# Patient Record
Sex: Female | Born: 1970 | Race: White | Hispanic: Yes | Marital: Married | Smoking: Never smoker
Health system: Southern US, Community
[De-identification: ages and names within clinical notes are randomized; demographics above are authoritative.]

## PROBLEM LIST (undated history)

## (undated) DIAGNOSIS — G4730 Sleep apnea, unspecified: Secondary | ICD-10-CM

## (undated) DIAGNOSIS — F32A Depression, unspecified: Secondary | ICD-10-CM

## (undated) DIAGNOSIS — E785 Hyperlipidemia, unspecified: Secondary | ICD-10-CM

## (undated) DIAGNOSIS — E7800 Pure hypercholesterolemia, unspecified: Secondary | ICD-10-CM

## (undated) DIAGNOSIS — J302 Other seasonal allergic rhinitis: Secondary | ICD-10-CM

## (undated) DIAGNOSIS — E079 Disorder of thyroid, unspecified: Secondary | ICD-10-CM

## (undated) DIAGNOSIS — E039 Hypothyroidism, unspecified: Secondary | ICD-10-CM

## (undated) DIAGNOSIS — T7840XA Allergy, unspecified, initial encounter: Secondary | ICD-10-CM

## (undated) DIAGNOSIS — I10 Essential (primary) hypertension: Secondary | ICD-10-CM

## (undated) DIAGNOSIS — F329 Major depressive disorder, single episode, unspecified: Secondary | ICD-10-CM

## (undated) HISTORY — DX: Sleep apnea, unspecified: G47.30

## (undated) HISTORY — DX: Hyperlipidemia, unspecified: E78.5

## (undated) HISTORY — DX: Essential (primary) hypertension: I10

## (undated) HISTORY — DX: Hypothyroidism, unspecified: E03.9

## (undated) HISTORY — DX: Allergy, unspecified, initial encounter: T78.40XA

## (undated) HISTORY — DX: Other seasonal allergic rhinitis: J30.2

## (undated) HISTORY — PX: ADENOIDECTOMY: SUR15

## (undated) HISTORY — PX: DENTAL SURGERY: SHX609

## (undated) HISTORY — PX: COLONOSCOPY: SHX174

---

## 2012-10-25 ENCOUNTER — Other Ambulatory Visit: Payer: Self-pay | Admitting: Allergy and Immunology

## 2012-10-25 ENCOUNTER — Ambulatory Visit
Admission: RE | Admit: 2012-10-25 | Discharge: 2012-10-25 | Disposition: A | Discharge status: Home / Self Care | Payer: 59 | Source: Ambulatory Visit | Attending: Allergy and Immunology | Admitting: Allergy and Immunology

## 2012-10-25 DIAGNOSIS — R059 Cough, unspecified: Secondary | ICD-10-CM

## 2012-10-25 DIAGNOSIS — R05 Cough: Secondary | ICD-10-CM

## 2015-10-01 ENCOUNTER — Encounter: Payer: Self-pay | Admitting: Emergency Medicine

## 2015-10-01 ENCOUNTER — Inpatient Hospital Stay
Admission: EM | Admit: 2015-10-01 | Discharge: 2015-10-03 | DRG: 683 | Disposition: A | Discharge status: Home / Self Care | Payer: 59 | Attending: Internal Medicine | Admitting: Internal Medicine

## 2015-10-01 DIAGNOSIS — E860 Dehydration: Secondary | ICD-10-CM

## 2015-10-01 DIAGNOSIS — E871 Hypo-osmolality and hyponatremia: Secondary | ICD-10-CM | POA: Diagnosis not present

## 2015-10-01 DIAGNOSIS — Z79899 Other long term (current) drug therapy: Secondary | ICD-10-CM | POA: Diagnosis not present

## 2015-10-01 DIAGNOSIS — A020 Salmonella enteritis: Secondary | ICD-10-CM | POA: Diagnosis present

## 2015-10-01 DIAGNOSIS — E861 Hypovolemia: Secondary | ICD-10-CM | POA: Diagnosis present

## 2015-10-01 DIAGNOSIS — E876 Hypokalemia: Secondary | ICD-10-CM | POA: Diagnosis present

## 2015-10-01 DIAGNOSIS — Z9889 Other specified postprocedural states: Secondary | ICD-10-CM

## 2015-10-01 DIAGNOSIS — E785 Hyperlipidemia, unspecified: Secondary | ICD-10-CM | POA: Diagnosis present

## 2015-10-01 DIAGNOSIS — Z888 Allergy status to other drugs, medicaments and biological substances status: Secondary | ICD-10-CM | POA: Diagnosis not present

## 2015-10-01 DIAGNOSIS — N390 Urinary tract infection, site not specified: Secondary | ICD-10-CM | POA: Diagnosis present

## 2015-10-01 DIAGNOSIS — N170 Acute kidney failure with tubular necrosis: Secondary | ICD-10-CM | POA: Diagnosis not present

## 2015-10-01 DIAGNOSIS — N179 Acute kidney failure, unspecified: Secondary | ICD-10-CM

## 2015-10-01 DIAGNOSIS — E1165 Type 2 diabetes mellitus with hyperglycemia: Secondary | ICD-10-CM | POA: Diagnosis present

## 2015-10-01 DIAGNOSIS — E039 Hypothyroidism, unspecified: Secondary | ICD-10-CM | POA: Diagnosis present

## 2015-10-01 DIAGNOSIS — R7989 Other specified abnormal findings of blood chemistry: Secondary | ICD-10-CM | POA: Diagnosis present

## 2015-10-01 DIAGNOSIS — R197 Diarrhea, unspecified: Secondary | ICD-10-CM

## 2015-10-01 HISTORY — DX: Pure hypercholesterolemia, unspecified: E78.00

## 2015-10-01 HISTORY — DX: Depression, unspecified: F32.A

## 2015-10-01 HISTORY — DX: Acute kidney failure, unspecified: N17.9

## 2015-10-01 HISTORY — DX: Major depressive disorder, single episode, unspecified: F32.9

## 2015-10-01 HISTORY — DX: Disorder of thyroid, unspecified: E07.9

## 2015-10-01 LAB — GASTROINTESTINAL PANEL BY PCR, STOOL (REPLACES STOOL CULTURE)
Adenovirus F40/41: NOT DETECTED
Astrovirus: NOT DETECTED
Campylobacter species: NOT DETECTED
Cryptosporidium: NOT DETECTED
Cyclospora cayetanensis: NOT DETECTED
E. coli O157: NOT DETECTED
Entamoeba histolytica: NOT DETECTED
Enteroaggregative E coli (EAEC): NOT DETECTED
Enteropathogenic E coli (EPEC): NOT DETECTED
Enterotoxigenic E coli (ETEC): NOT DETECTED
Giardia lamblia: NOT DETECTED
Norovirus GI/GII: NOT DETECTED
Plesimonas shigelloides: NOT DETECTED
Rotavirus A: NOT DETECTED
Salmonella species: DETECTED — AB
Sapovirus (I, II, IV, and V): NOT DETECTED
Shiga like toxin producing E coli (STEC): NOT DETECTED
Shigella/Enteroinvasive E coli (EIEC): NOT DETECTED
Vibrio cholerae: NOT DETECTED
Vibrio species: NOT DETECTED
Yersinia enterocolitica: NOT DETECTED

## 2015-10-01 LAB — URINALYSIS COMPLETE WITH MICROSCOPIC (ARMC ONLY)
Bilirubin Urine: NEGATIVE
Glucose, UA: NEGATIVE mg/dL
Ketones, ur: NEGATIVE mg/dL
Leukocytes, UA: NEGATIVE
Nitrite: NEGATIVE
Protein, ur: 100 mg/dL — AB
Specific Gravity, Urine: 1.021 (ref 1.005–1.030)
pH: 5 (ref 5.0–8.0)

## 2015-10-01 LAB — COMPREHENSIVE METABOLIC PANEL
ALT: 25 U/L (ref 14–54)
AST: 28 U/L (ref 15–41)
Albumin: 4.6 g/dL (ref 3.5–5.0)
Alkaline Phosphatase: 63 U/L (ref 38–126)
Anion gap: 20 — ABNORMAL HIGH (ref 5–15)
BUN: 50 mg/dL — ABNORMAL HIGH (ref 6–20)
CO2: 13 mmol/L — ABNORMAL LOW (ref 22–32)
Calcium: 9.4 mg/dL (ref 8.9–10.3)
Chloride: 95 mmol/L — ABNORMAL LOW (ref 101–111)
Creatinine, Ser: 4.29 mg/dL — ABNORMAL HIGH (ref 0.44–1.00)
GFR calc Af Amer: 13 mL/min — ABNORMAL LOW (ref >60)
GFR calc non Af Amer: 12 mL/min — ABNORMAL LOW (ref >60)
Glucose, Bld: 129 mg/dL — ABNORMAL HIGH (ref 65–99)
Potassium: 3.6 mmol/L (ref 3.5–5.1)
Sodium: 128 mmol/L — ABNORMAL LOW (ref 135–145)
Total Bilirubin: 0.8 mg/dL (ref 0.3–1.2)
Total Protein: 9.5 g/dL — ABNORMAL HIGH (ref 6.5–8.1)

## 2015-10-01 LAB — CBC
HCT: 49.5 % — ABNORMAL HIGH (ref 35.0–47.0)
Hemoglobin: 17.3 g/dL — ABNORMAL HIGH (ref 12.0–16.0)
MCH: 31.8 pg (ref 26.0–34.0)
MCHC: 35 g/dL (ref 32.0–36.0)
MCV: 90.9 fL (ref 80.0–100.0)
Platelets: 225 10*3/uL (ref 150–440)
RBC: 5.44 MIL/uL — ABNORMAL HIGH (ref 3.80–5.20)
RDW: 14.2 % (ref 11.5–14.5)
WBC: 6.6 10*3/uL (ref 3.6–11.0)

## 2015-10-01 LAB — TROPONIN I: Troponin I: <0.03 ng/mL (ref <0.03)

## 2015-10-01 LAB — C DIFFICILE QUICK SCREEN W PCR REFLEX
C Diff antigen: NEGATIVE
C Diff interpretation: NOT DETECTED
C Diff toxin: NEGATIVE

## 2015-10-01 LAB — PREGNANCY, URINE: Preg Test, Ur: NEGATIVE

## 2015-10-01 LAB — LIPASE, BLOOD: Lipase: 24 U/L (ref 11–51)

## 2015-10-01 MED ORDER — SODIUM CHLORIDE 0.9 % IV BOLUS (SEPSIS)
2000.0000 mL | Freq: Once | INTRAVENOUS | Status: AC
  Administered 2015-10-01: 2000 mL via INTRAVENOUS

## 2015-10-01 MED ORDER — ONDANSETRON HCL 4 MG PO TABS: 4.0000 mg | ORAL_TABLET | Freq: Four times a day (QID) | ORAL | Status: DC | PRN

## 2015-10-01 MED ORDER — ESCITALOPRAM OXALATE 10 MG PO TABS
20.0000 mg | ORAL_TABLET | Freq: Every day | ORAL | Status: DC
  Administered 2015-10-01 – 2015-10-02 (×2): 20 mg via ORAL
  Filled 2015-10-01: qty 2
  Filled 2015-10-01 (×2): qty 1

## 2015-10-01 MED ORDER — LEVOTHYROXINE SODIUM 75 MCG PO TABS
75.0000 ug | ORAL_TABLET | Freq: Every day | ORAL | Status: DC
  Administered 2015-10-02 – 2015-10-03 (×2): 75 ug via ORAL
  Filled 2015-10-01 (×3): qty 1

## 2015-10-01 MED ORDER — SODIUM CHLORIDE 0.9 % IV SOLN
INTRAVENOUS | Status: DC
  Administered 2015-10-01 – 2015-10-02 (×4): via INTRAVENOUS

## 2015-10-01 MED ORDER — LEVOTHYROXINE SODIUM 25 MCG PO TABS: 25.0000 ug | ORAL_TABLET | Freq: Every day | ORAL | Status: DC

## 2015-10-01 MED ORDER — MONTELUKAST SODIUM 10 MG PO TABS
10.0000 mg | ORAL_TABLET | Freq: Every evening | ORAL | Status: DC
  Administered 2015-10-01 – 2015-10-02 (×2): 10 mg via ORAL
  Filled 2015-10-01 (×2): qty 1

## 2015-10-01 MED ORDER — ALBUTEROL SULFATE (2.5 MG/3ML) 0.083% IN NEBU: 2.5000 mg | INHALATION_SOLUTION | RESPIRATORY_TRACT | Status: DC | PRN

## 2015-10-01 MED ORDER — EZETIMIBE 10 MG PO TABS: 10.0000 mg | ORAL_TABLET | Freq: Every day | ORAL | Status: DC

## 2015-10-01 MED ORDER — PANTOPRAZOLE SODIUM 40 MG PO TBEC
40.0000 mg | DELAYED_RELEASE_TABLET | Freq: Every day | ORAL | Status: DC
  Administered 2015-10-01 – 2015-10-02 (×2): 40 mg via ORAL
  Filled 2015-10-01 (×2): qty 1

## 2015-10-01 MED ORDER — SIMVASTATIN 40 MG PO TABS
40.0000 mg | ORAL_TABLET | Freq: Every day | ORAL | Status: DC
  Administered 2015-10-01 – 2015-10-02 (×2): 40 mg via ORAL
  Filled 2015-10-01 (×2): qty 1

## 2015-10-01 MED ORDER — SIMVASTATIN 40 MG PO TABS: 40.0000 mg | ORAL_TABLET | Freq: Every day | ORAL | Status: DC

## 2015-10-01 MED ORDER — ONDANSETRON HCL 4 MG/2ML IJ SOLN: 4.0000 mg | Freq: Four times a day (QID) | INTRAMUSCULAR | Status: DC | PRN

## 2015-10-01 MED ORDER — ACETAMINOPHEN 325 MG PO TABS
650.0000 mg | ORAL_TABLET | Freq: Four times a day (QID) | ORAL | Status: DC | PRN
Start: 2015-10-01 — End: 2015-10-03

## 2015-10-01 MED ORDER — HEPARIN SODIUM (PORCINE) 5000 UNIT/ML IJ SOLN
5000.0000 [IU] | Freq: Three times a day (TID) | INTRAMUSCULAR | Status: DC
  Administered 2015-10-01 – 2015-10-03 (×5): 5000 [IU] via SUBCUTANEOUS
  Filled 2015-10-01 (×5): qty 1

## 2015-10-01 MED ORDER — ACETAMINOPHEN 650 MG RE SUPP
650.0000 mg | Freq: Four times a day (QID) | RECTAL | Status: DC | PRN
Start: 2015-10-01 — End: 2015-10-03

## 2015-10-01 MED ORDER — EZETIMIBE-SIMVASTATIN 10-40 MG PO TABS: 1.0000 | ORAL_TABLET | Freq: Every day | ORAL | Status: DC

## 2015-10-01 NOTE — ED Provider Notes (Signed)
Time Seen: Approximately 1630  I have reviewed the triage notes  Chief Complaint: Weakness; Diarrhea; and Nausea   History of Present Illness: Debbie [name] is a 45 y.o. female who presents with some generalized weakness, nausea, and persistent diarrhea over the last 3 days. She describes it as watery diarrhea. She states that her father has the same issue and she's had approximately 25 bowel movements since early this morning. She denies any melena or hematochezia. She [place] and had a fever at that time of up to 102.3. She's taken Tylenol and Zofran for nausea. She states the diarrhea started roughly the same time. She denies any persistent focal abdominal pain and states she gets transient cramping discomfort that starts in the epigastric area and radiates to the lower abdominal region exclusively with a bowel movement. She denies any persistent vomiting or focal weakness. She states she was on antibiotics recently questionable Augmentin for a "" sinus infection "" Past Medical History  Diagnosis Date  . Thyroid disease   . Depression   . Elevated cholesterol     There are no active problems to display for this patient.   Past Surgical History  Procedure Laterality Date  . Adenoidectomy      Past Surgical History  Procedure Laterality Date  . Adenoidectomy      No current outpatient prescriptions on file.  Allergies:  Lipitor  Family History: No family history on file.  Social History: Social History  Substance Use Topics  . Smoking status: Never Smoker   . Smokeless tobacco: None  . Alcohol Use: No     Review of Systems:   10 point review of systems was performed and was otherwise negative:  Constitutional: No fever Eyes: No visual disturbances ENT: No sore throat, ear pain Cardiac: No chest pain Respiratory: No shortness of breath, wheezing, or stridor Abdomen: Transient crampy diffuse  abdominal pain. Endocrine: No weight loss, No night sweats Extremities: No peripheral edema, cyanosis Skin: No rashes, easy bruising Neurologic: No focal weakness, trouble with speech or swollowing Urologic: No dysuria, Hematuria, or urinary frequency   Physical Exam:  ED Triage Vitals  Enc Vitals Group     BP 10/01/15 1444 115/81 mmHg     Pulse Rate 10/01/15 1444 115     Resp 10/01/15 1444 20     Temp 10/01/15 1444 97.7 F (36.5 C)     Temp Source 10/01/15 1444 Oral     SpO2 10/01/15 1444 100 %     Weight 10/01/15 1444 200 lb (90.719 kg)     Height 10/01/15 1444 5\' 3"  (1.6 m)     Head Cir --      Peak Flow --      Pain Score 10/01/15 1454 10     Pain Loc --      Pain Edu? --      Excl. in [place]? --     General: Awake , Alert , and Oriented times 3; GCS 15 Head: Normal cephalic , atraumatic Eyes: Pupils equal , round, reactive to light Nose/Throat: No nasal drainage, patent upper airway without erythema or exudate.  Neck: Supple, Full range of motion, No anterior adenopathy or palpable thyroid masses Lungs: Clear to ascultation without wheezes , rhonchi, or rales Heart: Regular rate, regular rhythm without murmurs , gallops , or rubs Abdomen: Mild tenderness to deep palpation to the. Umbilical area without any rebound and no focal  tenderness over McBurney's point, negative Murphy's sign..        Extremities: 2 plus symmetric pulses. No edema, clubbing or cyanosis Neurologic: normal ambulation, Motor symmetric without deficits, sensory intact Skin: warm, dry, no rashes   Labs:   All laboratory work was reviewed including any pertinent negatives or positives listed below:  Labs Reviewed  COMPREHENSIVE METABOLIC PANEL - Abnormal; Notable for the following:    Sodium 128 (*)    Chloride 95 (*)    CO2 13 (*)    Glucose, Bld 129 (*)    BUN 50 (*)    Creatinine, Ser 4.29 (*)    Total Protein 9.5 (*)    GFR calc non Af Amer 12 (*)    GFR calc Af Amer 13 (*)    Anion gap  20 (*)    All other components within normal limits  CBC - Abnormal; Notable for the following:    RBC 5.44 (*)    Hemoglobin 17.3 (*)    HCT 49.5 (*)    All other components within normal limits  GASTROINTESTINAL PANEL BY PCR, STOOL (REPLACES STOOL CULTURE)  C DIFFICILE QUICK SCREEN W PCR REFLEX  LIPASE, BLOOD  TROPONIN I  URINALYSIS COMPLETEWITH MICROSCOPIC ([facility] ONLY)  POC URINE PREG, ED  Patient has laboratory results of renal insufficiency and some mild hyponatremia  EKG: * ED ECG REPORT I, Daymon Larsen, the attending physician, personally viewed and interpreted this ECG.  Date: 10/01/2015 EKG Time: 1450 Rate: 120 Rhythm: normal sinus rhythm QRS Axis: normal Intervals: normal ST/T Wave abnormalities: normal Conduction Disturbances: none Narrative Interpretation: unremarkable No acute ischemic changes    ED Course:  Patient was started on IV fluid resuscitation and received 2 L up front. Stool sample results are pending at this point. Patient was able to urinate and states that she had not urinated now for 24 hours. She denies any focal abdominal pain and I felt the patient required further fluid resuscitation and observation.    Assessment: Acute diarrhea Renal insufficiency      Plan: * Inpatient management            Daymon Larsen, MD 10/01/15 [id number]

## 2015-10-01 NOTE — ED Notes (Signed)
Pt presents to ED with reports of weakness, nausea and diarrhea for three days. Pt reports fever three days ago up to 102.3 but denies fever since then, Pt states seen at the [facility] three days ago and prescribed Zofran which is making her very sleepy but not relieving the nausea.

## 2015-10-01 NOTE — ED Notes (Signed)
Unable to obtain IV access, multiple RNs and tech attempted. MD and charge nurse made aware

## 2015-10-01 NOTE — ED Notes (Signed)
Pt reports she is unable to urinate, states she thinks she is dehydrated

## 2015-10-01 NOTE — H&P (Signed)
[facility] at [place] NAME: Debbie [name]    MR#:  [id number]  DATE OF BIRTH:  [birth year]  DATE OF ADMISSION:  10/01/2015  PRIMARY CARE PHYSICIAN: Pcp Not In System   REQUESTING/REFERRING PHYSICIAN: Dr Marcelene Butte  CHIEF COMPLAINT:  Nausea and diarrhea for 3 days.  HISTORY OF PRESENT ILLNESS:  Debbie [name]  is a 45 y.o. female with a known history of Hypothyroidism and hyperlipidemia comes to the emergency room with complains of severe diarrhea for last 3-4 days. Patient [place] the beach and started having severe diarrhea past Tuesday and continues to have several throughout the day. She was seen at [facility] in [facility] were do not improve with this management and came to the emergency room. She was very weak and severely dehydrated. She was found to have creatinine more than 4. She is "better with acute renal failure appears prerenal azotemia from GI losses.  PAST MEDICAL HISTORY:   Past Medical History  Diagnosis Date  . Thyroid disease   . Depression   . Elevated cholesterol     PAST SURGICAL HISTOIRY:   Past Surgical History  Procedure Laterality Date  . Adenoidectomy      SOCIAL HISTORY:   Social History  Substance Use Topics  . Smoking status: Never Smoker   . Smokeless tobacco: Not on file  . Alcohol Use: No    FAMILY HISTORY:  No family history on file.  DRUG ALLERGIES:   Allergies  Allergen Reactions  . Lipitor [Atorvastatin] Hives    REVIEW OF SYSTEMS:  Review of Systems  Constitutional: Negative for fever, chills and weight loss.  HENT: Negative for ear discharge, ear pain and nosebleeds.   Eyes: Negative for blurred vision, pain and discharge.  Respiratory: Negative for sputum production, shortness of breath, wheezing and stridor.   Cardiovascular: Negative for chest pain, palpitations, orthopnea and PND.  Gastrointestinal: Negative for nausea, vomiting, abdominal pain and  diarrhea.  Genitourinary: Negative for urgency and frequency.  Musculoskeletal: Negative for back pain and joint pain.  Neurological: Negative for sensory change, speech change, focal weakness and weakness.  Psychiatric/Behavioral: Negative for depression and hallucinations. The patient is not nervous/anxious.      MEDICATIONS AT HOME:   Prior to Admission medications   Medication Sig Start Date End Date Taking? Authorizing Provider  escitalopram (LEXAPRO) 20 MG tablet Take 1 tablet by mouth daily. 08/04/15   Historical Provider, MD  levothyroxine (SYNTHROID, LEVOTHROID) 75 MCG tablet Take 1 tablet by mouth every morning. 08/31/15   Historical Provider, MD  montelukast (SINGULAIR) 10 MG tablet Take 1 tablet by mouth every evening. 07/13/15   Historical Provider, MD  PROAIR RESPICLICK [id number] (90 Base) MCG/ACT AEPB Inhale 2 puffs into the lungs every 4 (four) hours as needed. 09/04/15   Historical Provider, MD  SAXENDA 18 MG/3ML SOPN Inject 3 mg as directed daily. 07/28/15   Historical Provider, MD  simvastatin (ZOCOR) 40 MG tablet Take 1 tablet by mouth every evening. 09/02/15   Historical Provider, MD      VITAL SIGNS:  Blood pressure 115/81, pulse 115, temperature 97.7 F (36.5 C), temperature source Oral, resp. rate 20, height 5\' 3"  (1.6 m), weight 90.719 kg (200 lb), last menstrual period 09/05/2015, SpO2 100 %.  PHYSICAL EXAMINATION:  GENERAL:  45 y.o.-year-old patient lying in the bed with no acute distress.  EYES: Pupils equal, round, reactive to light and accommodation. No scleral icterus. Extraocular muscles  intact.  HEENT: Head atraumatic, normocephalic. Oropharynx and nasopharynx clear. Oral mucosa dry. NECK:  Supple, no jugular venous distention. No thyroid enlargement, no tenderness.  LUNGS: Normal breath sounds bilaterally, no wheezing, rales,rhonchi or crepitation. No use of accessory muscles of respiration.  CARDIOVASCULAR: S1, S2 normal. No murmurs, rubs, or gallops.  ABDOMEN:  Soft, nontender, nondistended. Bowel sounds present. No organomegaly or mass.  EXTREMITIES: No pedal edema, cyanosis, or clubbing.  NEUROLOGIC: Cranial nerves II through XII are intact. Muscle strength 5/5 in all extremities. Sensation intact. Gait not checked.  PSYCHIATRIC: The patient is alert and oriented x 3.  SKIN: No obvious rash, lesion, or ulcer. Skin appears tanned   LABORATORY PANEL:   CBC  Recent Labs Lab 10/01/15 1458  WBC 6.6  HGB 17.3*  HCT 49.5*  PLT 225   ------------------------------------------------------------------------------------------------------------------  Chemistries   Recent Labs Lab 10/01/15 1458  NA 128*  K 3.6  CL 95*  CO2 13*  GLUCOSE 129*  BUN 50*  CREATININE 4.29*  CALCIUM 9.4  AST 28  ALT 25  ALKPHOS 63  BILITOT 0.8   ------------------------------------------------------------------------------------------------------------------  Cardiac Enzymes  Recent Labs Lab 10/01/15 1458  TROPONINI <0.03   ------------------------------------------------------------------------------------------------------------------  RADIOLOGY:  No results found.  EKG:    IMPRESSION AND PLAN:   Debbie [name]  is a 45 y.o. female with a known history of Hypothyroidism and hyperlipidemia comes to the emergency room with complains of severe diarrhea for last 3-4 days. Patient [place] the beach and started having severe diarrhea past Tuesday and continues to have several throughout the day.   1. Acute renal failure secondary to GI losses Patient came in with creatinine and of 4.29 and sodium of 128 -She seems to be severely dehydrated from diarrhea for more than 48 hours -Admit to medical floor -Aggressive IV hydration -Monitor I's and O's -Avoid nephrotoxins -Monitor creatinine -Consider nephrology consultation does not improve -Clear liquid diet  2. Hypothyroidism continue Synthroid  3. Type 2 diabetes Sliding scale  insulin  4. DVT prophylaxis subcutaneous heparin   All the records are reviewed and case discussed with ED provider. Management plans discussed with the patient, family and they are in agreement.  CODE STATUS:  Full  TOTAL TIME TAKING CARE OF THIS PATIENT:  50 minutes.    Cylan Borum M.D on 10/01/2015 at 7:34 PM  Between 7am to 6pm - Pager - [phone]  After 6pm go to [email or web address] - password [email or web address] [place] [facility]  Office  [phone]  CC: Primary care physician; Pcp Not In System

## 2015-10-02 ENCOUNTER — Inpatient Hospital Stay: Payer: 59

## 2015-10-02 LAB — BASIC METABOLIC PANEL
Anion gap: 12 (ref 5–15)
BUN: 39 mg/dL — ABNORMAL HIGH (ref 6–20)
CO2: 15 mmol/L — ABNORMAL LOW (ref 22–32)
Calcium: 8.4 mg/dL — ABNORMAL LOW (ref 8.9–10.3)
Chloride: 106 mmol/L (ref 101–111)
Creatinine, Ser: 2.34 mg/dL — ABNORMAL HIGH (ref 0.44–1.00)
GFR calc Af Amer: 28 mL/min — ABNORMAL LOW (ref >60)
GFR calc non Af Amer: 24 mL/min — ABNORMAL LOW (ref >60)
Glucose, Bld: 109 mg/dL — ABNORMAL HIGH (ref 65–99)
Potassium: 3.3 mmol/L — ABNORMAL LOW (ref 3.5–5.1)
Sodium: 133 mmol/L — ABNORMAL LOW (ref 135–145)

## 2015-10-02 LAB — HEMOGLOBIN A1C: Hgb A1c MFr Bld: 5.2 % (ref 4.0–6.0)

## 2015-10-02 LAB — MAGNESIUM: Magnesium: 2 mg/dL (ref 1.7–2.4)

## 2015-10-02 MED ORDER — POTASSIUM CHLORIDE CRYS ER 20 MEQ PO TBCR
40.0000 meq | EXTENDED_RELEASE_TABLET | Freq: Two times a day (BID) | ORAL | Status: AC
  Administered 2015-10-02 (×2): 40 meq via ORAL
  Filled 2015-10-02 (×2): qty 2

## 2015-10-02 MED ORDER — CIPROFLOXACIN HCL 250 MG PO TABS
500.0000 mg | ORAL_TABLET | Freq: Two times a day (BID) | ORAL | Status: DC
  Administered 2015-10-02 – 2015-10-03 (×3): 500 mg via ORAL
  Filled 2015-10-02 (×3): qty 2

## 2015-10-02 MED ORDER — AMOXICILLIN 500 MG PO CAPS
500.0000 mg | ORAL_CAPSULE | Freq: Two times a day (BID) | ORAL | Status: DC
  Administered 2015-10-02: 500 mg via ORAL
  Filled 2015-10-02: qty 1

## 2015-10-02 MED ORDER — ENSURE ENLIVE PO LIQD
237.0000 mL | Freq: Two times a day (BID) | ORAL | Status: DC
  Administered 2015-10-02: 237 mL via ORAL

## 2015-10-02 NOTE — Progress Notes (Addendum)
Per, Valeta Harms and Per CDC guidelines, patient does not need to be on contact nor Enteric precaution even if she has salmonella because patient is alert and oriented and  Continent and not wearing diaper. Dr. Clayton Bibles made aware.

## 2015-10-02 NOTE — Progress Notes (Signed)
[facility] [place] at [place] NAME: Debbie [name]    MR#:  [id number]  DATE OF BIRTH:  [birth year]  SUBJECTIVE:  CHIEF COMPLAINT:   Chief Complaint  Patient presents with  . Weakness  . Diarrhea  . Nausea  Patient is 45 year old Caucasian female with past medical history significant for history of hypothyroidism, hyperlipidemia who presents to the hospital with complaints of diarrhea for the past 3-4 days, she felt weak, as well as dehydrated. On arrival to the hospital, patient's creatinine was found to be more than 4, she was initiated on high rate IV fluids and feels a little bit better today, admits of some abdominal cramping, however, her diarrhea seems to be subsiding. Urine cultures revealed Salmonella. Patient was just initiated on ciprofloxacin orally.   Review of Systems  Constitutional: Negative for fever, chills and weight loss.  HENT: Negative for congestion.   Eyes: Negative for blurred vision and double vision.  Respiratory: Negative for cough, sputum production, shortness of breath and wheezing.   Cardiovascular: Negative for chest pain, palpitations, orthopnea, leg swelling and PND.  Gastrointestinal: Positive for abdominal pain and diarrhea. Negative for nausea, vomiting, constipation and blood in stool.  Genitourinary: Negative for dysuria, urgency, frequency and hematuria.  Musculoskeletal: Negative for falls.  Neurological: Negative for dizziness, tremors, focal weakness and headaches.  Endo/Heme/Allergies: Does not bruise/bleed easily.  Psychiatric/Behavioral: Negative for depression. The patient does not have insomnia.     VITAL SIGNS: Blood pressure 136/81, pulse 87, temperature 98.3 F (36.8 C), temperature source Oral, resp. rate 17, height 5\' 3"  (1.6 m), weight 90.719 kg (200 lb), last menstrual period 09/05/2015, SpO2 99 %.  PHYSICAL EXAMINATION:   GENERAL:  45 y.o.-year-old patient lying in the bed with no acute  distress.  EYES: Pupils equal, round, reactive to light and accommodation. No scleral icterus. Extraocular muscles intact.  HEENT: Head atraumatic, normocephalic. Oropharynx and nasopharynx clear.  NECK:  Supple, no jugular venous distention. No thyroid enlargement, no tenderness.  LUNGS: Normal breath sounds bilaterally, no wheezing, rales,rhonchi or crepitation. No use of accessory muscles of respiration.  CARDIOVASCULAR: S1, S2 normal. No murmurs, rubs, or gallops.  ABDOMEN: Soft, mild discomfort on palpation diffusely, but no rebound or guarding was noted and no point of tenderness, nondistended. Bowel sounds present. No organomegaly or mass.  EXTREMITIES: No pedal edema, cyanosis, or clubbing.  NEUROLOGIC: Cranial nerves II through XII are intact. Muscle strength 5/5 in all extremities. Sensation intact. Gait not checked.  PSYCHIATRIC: The patient is alert and oriented x 3.  SKIN: No obvious rash, lesion, or ulcer.   ORDERS/RESULTS REVIEWED:   CBC  Recent Labs Lab 10/01/15 1458  WBC 6.6  HGB 17.3*  HCT 49.5*  PLT 225  MCV 90.9  MCH 31.8  MCHC 35.0  RDW 14.2   ------------------------------------------------------------------------------------------------------------------  Chemistries   Recent Labs Lab 10/01/15 1458 10/02/15 0437  NA 128* 133*  K 3.6 3.3*  CL 95* 106  CO2 13* 15*  GLUCOSE 129* 109*  BUN 50* 39*  CREATININE 4.29* 2.34*  CALCIUM 9.4 8.4*  MG  --  2.0  AST 28  --   ALT 25  --   ALKPHOS 63  --   BILITOT 0.8  --    ------------------------------------------------------------------------------------------------------------------ estimated creatinine clearance is 32.4 mL/min (by C-G formula based on Cr of 2.34). ------------------------------------------------------------------------------------------------------------------ No results for input(s): TSH, T4TOTAL, T3FREE, THYROIDAB in the last 72 hours.  Invalid input(s): FREET3  Cardiac  Enzymes  Recent Labs Lab 10/01/15 1458  TROPONINI <0.03   ------------------------------------------------------------------------------------------------------------------ Invalid input(s): POCBNP ---------------------------------------------------------------------------------------------------------------  RADIOLOGY: US Renal  10/02/2015  CLINICAL DATA:  Acute renal failure EXAM: RENAL / URINARY TRACT ULTRASOUND COMPLETE COMPARISON:  None. FINDINGS: Right Kidney: Length: 11.1 cm. Echogenicity within normal limits. No mass or hydronephrosis visualized. Left Kidney: Length: 12.0 cm. Echogenicity within normal limits. No mass or hydronephrosis visualized. Bladder: Appears normal for degree of bladder distention. Instantly noted is increased echotexture throughout the visualized liver suggesting fatty infiltration. IMPRESSION: No acute findings.  No hydronephrosis. Fatty infiltration of the liver. Electronically Signed   By: Rolm Baptise M.D.   On: 10/02/2015 11:49    EKG:  Orders placed or performed during the hospital encounter of 10/01/15  . EKG 12-Lead  . EKG 12-Lead  . ED EKG within 10 minutes  . ED EKG within 10 minutes    ASSESSMENT AND PLAN:  Active Problems:   Acute renal failure (ARF) ([place]) #1. Acute renal failure due to acute tubular necrosis due to hypovolemia with diarrhea, improving on IV fluid administration, normal renal ultrasound, nephrology consultation, follow creatinine in the morning, urinary output has improved #2. Pyuria, questionable urinary tract infection, get urinary cultures, continue patient on ciprofloxacin #3. Salmonella gastroenteritis, continue ciprofloxacin adjusted per kidney function, IV fluids, clear liquid diet to be advanced to full liquid diet. The patient is able to tolerate #4. Hyponatremia, improving with IV fluid administration, likely dehydration related #5. Hypokalemia, supplement orally, magnesium level was normal, check potassium level in  the morning #6 hemoconcentration due to severe hypovolemia, follow CBC in the morning #7. Hyperglycemia, get hemoglobin A1c  Management plans discussed with the patient, family and they are in agreement.   DRUG ALLERGIES:  Allergies  Allergen Reactions  . Lipitor [Atorvastatin] Hives    CODE STATUS:     Code Status Orders        Start     Ordered   10/01/15 2028  Full code   Continuous     10/01/15 2027    Code Status History    Date Active Date Inactive Code Status Order ID Comments User Context   This patient has a current code status but no historical code status.    Advance Directive Documentation        Most Recent Value   Type of Advance Directive  Healthcare Power of Attorney, Living will   Pre-existing out of facility DNR order (yellow form or pink MOST form)     "MOST" Form in Place?        TOTAL TIME TAKING CARE OF THIS PATIENT: 40 minutes.    Theodoro Grist M.D on 10/02/2015 at 12:47 PM  Between 7am to 6pm - Pager - [phone]  After 6pm go to [email or web address] - password [email or web address] [place] [facility]  Office  [phone]  CC: Primary care physician; Pcp Not In System

## 2015-10-02 NOTE — Consult Note (Signed)
[facility]  CONSULT NOTE    Date: 10/02/2015                  Patient Name:  Debbie [name]  MRN: [id number]  DOB: 07/16/1970  Age / Sex: 45 y.o., female         PCP: Pcp Not In System                 Service Requesting Consult: Dr. Ether Griffins                 Reason for Consult: Acute Renal Failure            History of Present Illness: Ms. Sharley Bohner is a 45 y.o. white female with history of depression, hypothyroidism, hyperlipidemia, type II diabetes mellitus, who was admitted to [facility] on 10/01/2015 for Dehydration with hyponatremia [E87.1] Diarrhea, unspecified type [R19.7]  [place] the beach where she started to have fevers and chills. With diarrhea. Sick contact of her father. Went to urgent care and told to come the ED.   She presented to [facility] with creatinine of 4.29. Started on IV fluids and creatinine improved to 2.34.   Mother at bedside.    Medications: Outpatient medications: Prescriptions prior to admission  Medication Sig Dispense Refill Last Dose  . escitalopram (LEXAPRO) 20 MG tablet Take 1 tablet by mouth daily.  2 unknown at unknown  . levothyroxine (SYNTHROID, LEVOTHROID) 75 MCG tablet Take 1 tablet by mouth every morning.  2 unknown at unknown  . montelukast (SINGULAIR) 10 MG tablet Take 1 tablet by mouth every evening.  5 unknown at unknown  . PROAIR RESPICLICK 123XX123 (90 Base) MCG/ACT AEPB Inhale 2 puffs into the lungs every 4 (four) hours as needed.  0 unknown at unknown  . SAXENDA 18 MG/3ML SOPN Inject 3 mg as directed daily.  6 unknown at unknown  . simvastatin (ZOCOR) 40 MG tablet Take 1 tablet by mouth every evening.  2 unknown at unknown    Current medications: Current Facility-Administered Medications  Medication Dose Route Frequency Provider Last Rate Last Dose  . 0.9 %  sodium chloride infusion   Intravenous Continuous Fritzi Mandes, MD 150 mL/hr at 10/02/15 1104    . acetaminophen (TYLENOL) tablet 650 mg  650 mg Oral Q6H PRN  Fritzi Mandes, MD       Or  . acetaminophen (TYLENOL) suppository 650 mg  650 mg Rectal Q6H PRN Fritzi Mandes, MD      . albuterol (PROVENTIL) (2.5 MG/3ML) 0.083% nebulizer solution 2.5 mg  2.5 mg Inhalation Q4H PRN Fritzi Mandes, MD      . ciprofloxacin (CIPRO) tablet 500 mg  500 mg Oral BID Theodoro Grist, MD   500 mg at 10/02/15 1027  . escitalopram (LEXAPRO) tablet 20 mg  20 mg Oral Daily Fritzi Mandes, MD   20 mg at 10/02/15 0828  . feeding supplement (ENSURE ENLIVE) (ENSURE ENLIVE) liquid 237 mL  237 mL Oral BID BM Theodoro Grist, MD      . heparin injection 5,000 Units  5,000 Units Subcutaneous Q8H Fritzi Mandes, MD   5,000 Units at 10/02/15 1421  . levothyroxine (SYNTHROID, LEVOTHROID) tablet 75 mcg  75 mcg Oral QAC breakfast Fritzi Mandes, MD   75 mcg at 10/02/15 0829  . montelukast (SINGULAIR) tablet 10 mg  10 mg Oral QPM Fritzi Mandes, MD   10 mg at 10/01/15 2155  . ondansetron (ZOFRAN) tablet 4 mg  4 mg Oral Q6H PRN  Fritzi Mandes, MD       Or  . ondansetron [facility]) injection 4 mg  4 mg Intravenous Q6H PRN Fritzi Mandes, MD      . pantoprazole (PROTONIX) EC tablet 40 mg  40 mg Oral Daily Lance Coon, MD   40 mg at 10/02/15 1027  . potassium chloride SA (K-DUR,KLOR-CON) CR tablet 40 mEq  40 mEq Oral BID Theodoro Grist, MD   40 mEq at 10/02/15 1104  . simvastatin (ZOCOR) tablet 40 mg  40 mg Oral QHS Lance Coon, MD   40 mg at 10/01/15 2156      Allergies: Allergies  Allergen Reactions  . Lipitor [Atorvastatin] Hives      Past Medical History: Past Medical History  Diagnosis Date  . Thyroid disease   . Depression   . Elevated cholesterol      Past Surgical History: Past Surgical History  Procedure Laterality Date  . Adenoidectomy       Family History: No family history on file.   Social History: Social History   Social History  . Marital Status: Married    Spouse Name: N/A  . Number of Children: N/A  . Years of Education: N/A   Occupational History  . Not on file.   Social  History Main Topics  . Smoking status: Never Smoker   . Smokeless tobacco: Not on file  . Alcohol Use: No  . Drug Use: No  . Sexual Activity: Not on file   Other Topics Concern  . Not on file   Social History Narrative  . No narrative on file     Review of Systems: Review of Systems  Constitutional: Positive for fever, chills, malaise/fatigue and diaphoresis. Negative for weight loss.  HENT: Negative for congestion, ear discharge, ear pain, hearing loss, nosebleeds, sore throat and tinnitus.   Eyes: Negative.  Negative for blurred vision, double vision, photophobia, pain, discharge and redness.  Respiratory: Negative.  Negative for cough, hemoptysis, sputum production, shortness of breath, wheezing and stridor.   Cardiovascular: Negative.  Negative for chest pain, palpitations, orthopnea, claudication, leg swelling and PND.  Gastrointestinal: Positive for nausea, vomiting, abdominal pain and diarrhea. Negative for heartburn, constipation, blood in stool and melena.  Genitourinary: Negative.  Negative for dysuria, urgency, frequency, hematuria and flank pain.  Musculoskeletal: Negative.  Negative for myalgias, back pain, joint pain, falls and neck pain.  Skin: Negative.  Negative for itching and rash.  Neurological: Positive for weakness. Negative for dizziness, tingling, tremors, sensory change, speech change, focal weakness, seizures, loss of consciousness and headaches.  Endo/Heme/Allergies: Negative.  Negative for environmental allergies and polydipsia. Does not bruise/bleed easily.  Psychiatric/Behavioral: Positive for depression. Negative for suicidal ideas, hallucinations, memory loss and substance abuse. The patient is not nervous/anxious and does not have insomnia.     Vital Signs: Blood pressure 136/81, pulse 87, temperature 98.3 F (36.8 C), temperature source Oral, resp. rate 17, height 5\' 3"  (1.6 m), weight 90.719 kg (200 lb), last menstrual period 09/05/2015, SpO2 99  %.  Weight trends: Filed Weights   10/01/15 1444  Weight: 90.719 kg (200 lb)    Physical Exam: General: NAD, laying in bed  Head: Normocephalic, atraumatic. Moist oral mucosal membranes  Eyes: Anicteric, PERRL  Neck: Supple, trachea midline  Lungs:  Clear to auscultation  Heart: Regular rate and rhythm  Abdomen:  Soft, nontender,   Extremities: no peripheral edema.  Neurologic: Nonfocal, moving all four extremities  Skin: No lesions  Lab results: Basic Metabolic Panel:  Recent Labs Lab 10/01/15 1458 10/02/15 0437  NA 128* 133*  K 3.6 3.3*  CL 95* 106  CO2 13* 15*  GLUCOSE 129* 109*  BUN 50* 39*  CREATININE 4.29* 2.34*  CALCIUM 9.4 8.4*  MG  --  2.0    Liver Function Tests:  Recent Labs Lab 10/01/15 1458  AST 28  ALT 25  ALKPHOS 63  BILITOT 0.8  PROT 9.5*  ALBUMIN 4.6    Recent Labs Lab 10/01/15 1458  LIPASE 24   No results for input(s): AMMONIA in the last 168 hours.  CBC:  Recent Labs Lab 10/01/15 1458  WBC 6.6  HGB 17.3*  HCT 49.5*  MCV 90.9  PLT 225    Cardiac Enzymes:  Recent Labs Lab 10/01/15 1458  TROPONINI <0.03    BNP: Invalid input(s): POCBNP  CBG: No results for input(s): GLUCAP in the last 168 hours.  Microbiology: Results for orders placed or performed during the hospital encounter of 10/01/15  Gastrointestinal Panel by PCR , Stool     Status: Abnormal   Collection Time: 10/01/15  5:38 PM  Result Value Ref Range Status   Campylobacter species NOT DETECTED NOT DETECTED Final   Plesimonas shigelloides NOT DETECTED NOT DETECTED Final   Salmonella species DETECTED (A) NOT DETECTED Final    Comment: CRITICAL RESULT CALLED TO, READ BACK BY AND VERIFIED WITH: GRACE CINDRIC 10/01/15 @ 1931  MLK    Yersinia enterocolitica NOT DETECTED NOT DETECTED Final   Vibrio species NOT DETECTED NOT DETECTED Final   Vibrio cholerae NOT DETECTED NOT DETECTED Final   Enteroaggregative E coli (EAEC) NOT DETECTED NOT DETECTED  Final   Enteropathogenic E coli (EPEC) NOT DETECTED NOT DETECTED Final   Enterotoxigenic E coli (ETEC) NOT DETECTED NOT DETECTED Final   Shiga like toxin producing E coli (STEC) NOT DETECTED NOT DETECTED Final   E. coli O157 NOT DETECTED NOT DETECTED Final   Shigella/Enteroinvasive E coli (EIEC) NOT DETECTED NOT DETECTED Final   Cryptosporidium NOT DETECTED NOT DETECTED Final   Cyclospora cayetanensis NOT DETECTED NOT DETECTED Final   Entamoeba histolytica NOT DETECTED NOT DETECTED Final   Giardia lamblia NOT DETECTED NOT DETECTED Final   Adenovirus F40/41 NOT DETECTED NOT DETECTED Final   Astrovirus NOT DETECTED NOT DETECTED Final   Norovirus GI/GII NOT DETECTED NOT DETECTED Final   Rotavirus A NOT DETECTED NOT DETECTED Final   Sapovirus (I, II, IV, and V) NOT DETECTED NOT DETECTED Final  C difficile quick scan w PCR reflex     Status: None   Collection Time: 10/01/15  5:38 PM  Result Value Ref Range Status   C Diff antigen NEGATIVE NEGATIVE Final   C Diff toxin NEGATIVE NEGATIVE Final   C Diff interpretation No C. difficile detected.  Final    Coagulation Studies: No results for input(s): LABPROT, INR in the last 72 hours.  Urinalysis:  Recent Labs  10/01/15 1738  COLORURINE AMBER*  LABSPEC 1.021  PHURINE 5.0  GLUCOSEU NEGATIVE  HGBUR 2+*  BILIRUBINUR NEGATIVE  KETONESUR NEGATIVE  PROTEINUR 100*  NITRITE NEGATIVE  LEUKOCYTESUR NEGATIVE      Imaging: US Renal  10/02/2015  CLINICAL DATA:  Acute renal failure EXAM: RENAL / URINARY TRACT ULTRASOUND COMPLETE COMPARISON:  None. FINDINGS: Right Kidney: Length: 11.1 cm. Echogenicity within normal limits. No mass or hydronephrosis visualized. Left Kidney: Length: 12.0 cm. Echogenicity within normal limits. No mass or hydronephrosis visualized. Bladder: Appears normal for degree of  bladder distention. Instantly noted is increased echotexture throughout the visualized liver suggesting fatty infiltration. IMPRESSION: No acute  findings.  No hydronephrosis. Fatty infiltration of the liver. Electronically Signed   By: Rolm Baptise M.D.   On: 10/02/2015 11:49      Assessment & Plan: Ms. Shakeda Keinath is a 45 y.o. white female with history of depression, hypothyroidism, hyperlipidemia, type II diabetes mellitus, who was admitted to [facility] on 10/01/2015 for Dehydration with hyponatremia [E87.1] Diarrhea, unspecified type [R19.7]  1. Acute Renal Failure 2. Hyponatremia 3. Metabolic Acidosis 4. Hypokalemia  Plan Acute renal failure from GI losses causing prerenal azotemia.  - Continue IV fluids: NS at 54mL/hr     LOS: [place], Dashawna Delbridge 7/14/20172:22 PM

## 2015-10-03 LAB — BASIC METABOLIC PANEL
Anion gap: 7 (ref 5–15)
BUN: 15 mg/dL (ref 6–20)
CO2: 20 mmol/L — ABNORMAL LOW (ref 22–32)
Calcium: 8.6 mg/dL — ABNORMAL LOW (ref 8.9–10.3)
Chloride: 111 mmol/L (ref 101–111)
Creatinine, Ser: 0.95 mg/dL (ref 0.44–1.00)
GFR calc Af Amer: >60 mL/min (ref >60)
GFR calc non Af Amer: >60 mL/min (ref >60)
Glucose, Bld: 100 mg/dL — ABNORMAL HIGH (ref 65–99)
Potassium: 3.7 mmol/L (ref 3.5–5.1)
Sodium: 138 mmol/L (ref 135–145)

## 2015-10-03 LAB — CBC
HCT: 34.9 % — ABNORMAL LOW (ref 35.0–47.0)
Hemoglobin: 12.5 g/dL (ref 12.0–16.0)
MCH: 32.3 pg (ref 26.0–34.0)
MCHC: 35.7 g/dL (ref 32.0–36.0)
MCV: 90.4 fL (ref 80.0–100.0)
Platelets: 197 10*3/uL (ref 150–440)
RBC: 3.86 MIL/uL (ref 3.80–5.20)
RDW: 14.3 % (ref 11.5–14.5)
WBC: 5.5 10*3/uL (ref 3.6–11.0)

## 2015-10-03 MED ORDER — CIPROFLOXACIN HCL 500 MG PO TABS: 500.0000 mg | ORAL_TABLET | Freq: Two times a day (BID) | ORAL | Status: DC

## 2015-10-03 NOTE — Discharge Summary (Signed)
[facility] NAME: Debbie [name]    MR#:  [id number]  DATE OF BIRTH:  [birth year]  DATE OF ADMISSION:  10/01/2015 ADMITTING PHYSICIAN: Fritzi Mandes, MD  DATE OF DISCHARGE: 10/03/2015  9:11 AM  PRIMARY CARE PHYSICIAN: Pcp Not In System    ADMISSION DIAGNOSIS:  Dehydration with hyponatremia [E87.1] Diarrhea, unspecified type [R19.7]  DISCHARGE DIAGNOSIS:  Active Problems:   Acute renal failure (ARF) (HCC) UTI Salmonella gastroenteritis SECONDARY DIAGNOSIS:   Past Medical History  Diagnosis Date  . Thyroid disease   . Depression   . Elevated cholesterol     HOSPITAL COURSE:   #1. Acute renal failure due to acute tubular necrosis due to hypovolemia with diarrhea, improved with IV fluid, normal renal ultrasound. #2. Pyuria, questionable urinary tract infection,  continue ciprofloxacin. #3. Salmonella gastroenteritis, continue ciprofloxacin. #4. Hyponatremia, improved with IV fluid. #5. Hypokalemia, improved with supplement orally, magnesium level was normal. #6 hemoconcentration due to severe hypovolemia, improved. #7. Hyperglycemia, improved, normal hemoglobin A1c  DISCHARGE CONDITIONS:  Stable, discharged to home today.  CONSULTS OBTAINED:  Treatment Team:  Lavonia Dana, MD  DRUG ALLERGIES:   Allergies  Allergen Reactions  . Lipitor [Atorvastatin] Hives    DISCHARGE MEDICATIONS:   Discharge Medication List as of 10/03/2015  8:51 AM    START taking these medications   Details  ciprofloxacin (CIPRO) 500 MG tablet Take 1 tablet (500 mg total) by mouth 2 (two) times daily., Starting 10/03/2015, Until Discontinued, Print      CONTINUE these medications which have NOT CHANGED   Details  escitalopram (LEXAPRO) 20 MG tablet Take 1 tablet by mouth daily., Starting 08/04/2015, Until Discontinued, Historical Med    levothyroxine (SYNTHROID, LEVOTHROID) 75 MCG tablet Take 1 tablet by mouth every morning.,  Starting 08/31/2015, Until Discontinued, Historical Med    montelukast (SINGULAIR) 10 MG tablet Take 1 tablet by mouth every evening., Starting 07/13/2015, Until Discontinued, Historical Med    PROAIR RESPICLICK 123XX123 (90 Base) MCG/ACT AEPB Inhale 2 puffs into the lungs every 4 (four) hours as needed., Starting 09/04/2015, Until Discontinued, Historical Med    SAXENDA 18 MG/3ML SOPN Inject 3 mg as directed daily., Starting 07/28/2015, Until Discontinued, Historical Med    simvastatin (ZOCOR) 40 MG tablet Take 1 tablet by mouth every evening., Starting 09/02/2015, Until Discontinued, Historical Med         DISCHARGE INSTRUCTIONS:    DIET:  Heart healthy diet.  DISCHARGE CONDITION:  Stable.  ACTIVITY:  As tolerated.  OXYGEN:  Home Oxygen: no.  Oxygen Delivery: no  DISCHARGE LOCATION:    If you experience worsening of your admission symptoms, develop shortness of breath, life threatening emergency, suicidal or homicidal thoughts you must seek medical attention immediately by calling 911 or calling your MD immediately  if symptoms less severe.  You Must read complete instructions/literature along with all the possible adverse reactions/side effects for all the Medicines you take and that have been prescribed to you. Take any new Medicines after you have completely understood and accpet all the possible adverse reactions/side effects.   Please note  You were cared for by a hospitalist during your hospital stay. If you have any questions about your discharge medications or the care you received while you were in the hospital after you are discharged, you can call the unit and asked to speak with the hospitalist on call if the hospitalist that took care of you is not available. Once you  are discharged, your primary care physician will handle any further medical issues. Please note that NO REFILLS for any discharge medications will be authorized once you are discharged, as it is imperative that  you return to your primary care physician (or establish a relationship with a primary care physician if you do not have one) for your aftercare needs so that they can reassess your need for medications and monitor your lab values.    On the day of Discharge:  VITAL SIGNS:  Blood pressure 132/86, pulse 82, temperature 97.8 F (36.6 C), temperature source Oral, resp. rate 16, height 5\' 3"  (1.6 m), weight 200 lb (90.719 kg), last menstrual period 09/05/2015, SpO2 98 %.  PHYSICAL EXAMINATION:  GENERAL:  45 y.o.-year-old patient lying in the bed with no acute distress.  EYES: Pupils equal, round, reactive to light and accommodation. No scleral icterus. Extraocular muscles intact.  HEENT: Head atraumatic, normocephalic. Oropharynx and nasopharynx clear.  NECK:  Supple, no jugular venous distention. No thyroid enlargement, no tenderness.  LUNGS: Normal breath sounds bilaterally, no wheezing, rales,rhonchi or crepitation. No use of accessory muscles of respiration.  CARDIOVASCULAR: S1, S2 normal. No murmurs, rubs, or gallops.  ABDOMEN: Soft, non-tender, non-distended. Bowel sounds present. No organomegaly or mass.  EXTREMITIES: No pedal edema, cyanosis, or clubbing.  NEUROLOGIC: Cranial nerves II through XII are intact. Muscle strength 5/5 in all extremities. Sensation intact. Gait not checked.  PSYCHIATRIC: The patient is alert and oriented x 3.  SKIN: No obvious rash, lesion, or ulcer.  DATA REVIEW:   CBC  Recent Labs Lab 10/03/15 0358  WBC 5.5  HGB 12.5  HCT 34.9*  PLT 197    Chemistries   Recent Labs Lab 10/01/15 1458 10/02/15 0437 10/03/15 0358  NA 128* 133* 138  K 3.6 3.3* 3.7  CL 95* 106 111  CO2 13* 15* 20*  GLUCOSE 129* 109* 100*  BUN 50* 39* 15  CREATININE 4.29* 2.34* 0.95  CALCIUM 9.4 8.4* 8.6*  MG  --  2.0  --   AST 28  --   --   ALT 25  --   --   ALKPHOS 63  --   --   BILITOT 0.8  --   --     Cardiac Enzymes  Recent Labs Lab 10/01/15 1458  TROPONINI  <0.03    Microbiology Results  Results for orders placed or performed during the hospital encounter of 10/01/15  Gastrointestinal Panel by PCR , Stool     Status: Abnormal   Collection Time: 10/01/15  5:38 PM  Result Value Ref Range Status   Campylobacter species NOT DETECTED NOT DETECTED Final   Plesimonas shigelloides NOT DETECTED NOT DETECTED Final   Salmonella species DETECTED (A) NOT DETECTED Final    Comment: CRITICAL RESULT CALLED TO, READ BACK BY AND VERIFIED WITH: GRACE CINDRIC 10/01/15 @ 1931  MLK    Yersinia enterocolitica NOT DETECTED NOT DETECTED Final   Vibrio species NOT DETECTED NOT DETECTED Final   Vibrio cholerae NOT DETECTED NOT DETECTED Final   Enteroaggregative E coli (EAEC) NOT DETECTED NOT DETECTED Final   Enteropathogenic E coli (EPEC) NOT DETECTED NOT DETECTED Final   Enterotoxigenic E coli (ETEC) NOT DETECTED NOT DETECTED Final   Shiga like toxin producing E coli (STEC) NOT DETECTED NOT DETECTED Final   E. coli O157 NOT DETECTED NOT DETECTED Final   Shigella/Enteroinvasive E coli (EIEC) NOT DETECTED NOT DETECTED Final   Cryptosporidium NOT DETECTED NOT DETECTED Final   Cyclospora cayetanensis  NOT DETECTED NOT DETECTED Final   Entamoeba histolytica NOT DETECTED NOT DETECTED Final   Giardia lamblia NOT DETECTED NOT DETECTED Final   Adenovirus F40/41 NOT DETECTED NOT DETECTED Final   Astrovirus NOT DETECTED NOT DETECTED Final   Norovirus GI/GII NOT DETECTED NOT DETECTED Final   Rotavirus A NOT DETECTED NOT DETECTED Final   Sapovirus (I, II, IV, and V) NOT DETECTED NOT DETECTED Final  C difficile quick scan w PCR reflex     Status: None   Collection Time: 10/01/15  5:38 PM  Result Value Ref Range Status   C Diff antigen NEGATIVE NEGATIVE Final   C Diff toxin NEGATIVE NEGATIVE Final   C Diff interpretation No C. difficile detected.  Final    RADIOLOGY:  US Renal  10/02/2015  CLINICAL DATA:  Acute renal failure EXAM: RENAL / URINARY TRACT ULTRASOUND  COMPLETE COMPARISON:  None. FINDINGS: Right Kidney: Length: 11.1 cm. Echogenicity within normal limits. No mass or hydronephrosis visualized. Left Kidney: Length: 12.0 cm. Echogenicity within normal limits. No mass or hydronephrosis visualized. Bladder: Appears normal for degree of bladder distention. Instantly noted is increased echotexture throughout the visualized liver suggesting fatty infiltration. IMPRESSION: No acute findings.  No hydronephrosis. Fatty infiltration of the liver. Electronically Signed   By: Rolm Baptise M.D.   On: 10/02/2015 11:49     Management plans discussed with the patient, family and they are in agreement.  CODE STATUS:  Code Status History    Date Active Date Inactive Code Status Order ID Comments User Context   10/01/2015  8:27 PM 10/03/2015 12:11 PM Full Code [id number]  Fritzi Mandes, MD Inpatient    Advance Directive Documentation        Most Recent Value   Type of Advance Directive  Healthcare Power of [place], Living will   Pre-existing out of facility DNR order (yellow form or pink MOST form)     "MOST" Form in Place?        TOTAL TIME TAKING CARE OF THIS PATIENT: 32 minutes.    Demetrios Loll M.D on 10/03/2015 at 1:51 PM  Between 7am to 6pm - Pager - [phone]  After 6pm go to [email or web address] - password [email or web address] [facility]  Office  [phone]  CC: Primary care physician; Pcp Not In System   Note: This dictation was prepared with Dragon dictation along with smaller phrase technology. Any transcriptional errors that result from this process are unintentional.

## 2015-10-03 NOTE — Discharge Instructions (Signed)
Heart healthy diet. °Activity as tolerated. °

## 2015-10-03 NOTE — Progress Notes (Signed)
Alert and oriented. Vital signs stable . No signs of acute distress. Discharge instructions given. Patient verbalizes understanding.  Tolerated diet. No nausea and vomiting. Denies pain. BM well controlled. No other issues noted at this time.

## 2019-02-07 ENCOUNTER — Other Ambulatory Visit: Payer: Self-pay

## 2019-02-08 ENCOUNTER — Ambulatory Visit: Payer: 59 | Admitting: Internal Medicine

## 2019-02-08 ENCOUNTER — Encounter: Payer: Self-pay | Admitting: Internal Medicine

## 2019-02-08 VITALS — BP 160/100 | HR 77 | Temp 97.7°F | Ht 63.0 in | Wt 252.1 lb

## 2019-02-08 DIAGNOSIS — F3342 Major depressive disorder, recurrent, in full remission: Secondary | ICD-10-CM | POA: Diagnosis not present

## 2019-02-08 DIAGNOSIS — Z1211 Encounter for screening for malignant neoplasm of colon: Secondary | ICD-10-CM | POA: Diagnosis not present

## 2019-02-08 DIAGNOSIS — E039 Hypothyroidism, unspecified: Secondary | ICD-10-CM | POA: Insufficient documentation

## 2019-02-08 DIAGNOSIS — F32A Depression, unspecified: Secondary | ICD-10-CM | POA: Insufficient documentation

## 2019-02-08 DIAGNOSIS — J302 Other seasonal allergic rhinitis: Secondary | ICD-10-CM

## 2019-02-08 DIAGNOSIS — E785 Hyperlipidemia, unspecified: Secondary | ICD-10-CM | POA: Insufficient documentation

## 2019-02-08 DIAGNOSIS — R030 Elevated blood-pressure reading, without diagnosis of hypertension: Secondary | ICD-10-CM

## 2019-02-08 DIAGNOSIS — Z23 Encounter for immunization: Secondary | ICD-10-CM | POA: Diagnosis not present

## 2019-02-08 DIAGNOSIS — F329 Major depressive disorder, single episode, unspecified: Secondary | ICD-10-CM | POA: Insufficient documentation

## 2019-02-08 HISTORY — DX: Morbid (severe) obesity due to excess calories: E66.01

## 2019-02-08 MED ORDER — LEVOTHYROXINE SODIUM 75 MCG PO TABS: 75.0000 ug | ORAL_TABLET | Freq: Every morning | ORAL | 1 refills | Status: DC

## 2019-02-08 MED ORDER — ROSUVASTATIN CALCIUM 20 MG PO TABS: 20.0000 mg | ORAL_TABLET | Freq: Every day | ORAL | 1 refills | Status: DC

## 2019-02-08 MED ORDER — ESCITALOPRAM OXALATE 20 MG PO TABS: 20.0000 mg | ORAL_TABLET | Freq: Every day | ORAL | 1 refills | Status: DC

## 2019-02-08 MED ORDER — FENOFIBRATE 145 MG PO TABS: 145.0000 mg | ORAL_TABLET | Freq: Every day | ORAL | 1 refills | Status: DC

## 2019-02-08 NOTE — Progress Notes (Signed)
New Patient Office Visit    This visit occurred during the SARS-CoV-2 public health emergency.  Safety protocols were in place, including screening questions prior to the visit, additional usage of staff PPE, and extensive cleaning of exam room while observing appropriate contact time as indicated for disinfecting solutions.    CC/Reason for Visit: Establish care, discuss chronic medical conditions Previous PCP: None Last Visit: Unknown  HPI: Debbie [name] is a 48 y.o. female who is coming in today for the above mentioned reasons. Past Medical History is significant for: Hypothyroidism well-controlled on Synthroid, depression with stable mood on Lexapro, severe seasonal allergies and asthma followed by the allergy center hyperlipidemia on a statin, morbid obesity.  She is a housewife, she has recently moved to [place] from [place] to be the primary caregiver for her parents, she is married, has no children.  She is a never smoker, drinks alcohol only occasionally and infrequently.  Allergies to Lipitor which cause hives but she has no problems with Crestor, past surgical history is significant for adenoidectomy in childhood.  Her family history significant for a father with coronary artery disease, mother with hypertension and hyperlipidemia, maternal grandmother with breast cancer, maternal grandfather with prostate cancer, diabetes and coronary artery disease.  She does mention that her father had "3 aneurysms" and was told by his cardiologist at [facility] that both she and her brother needed to have annual echocardiograms to follow.  She is requesting cardiology referral today.  She has no acute complaints.   Past Medical/Surgical History: Past Medical History:  Diagnosis Date  . Depression   . Elevated cholesterol   . Hyperlipidemia   . Hypothyroidism   . Seasonal allergies   . Thyroid disease     Past Surgical History:  Procedure Laterality Date  . ADENOIDECTOMY      Social  History:  reports that she has never smoked. She has never used smokeless tobacco. She reports that she does not drink alcohol or use drugs.  Allergies: Allergies  Allergen Reactions  . Atorvastatin Hives    Family History:  Family History  Problem Relation Age of Onset  . Heart disease Father   . Hypertension Mother   . Hyperlipidemia Mother   . Breast cancer Maternal Grandmother   . Prostate cancer Maternal Grandfather   . Diabetes Maternal Grandfather   . CAD Maternal Grandfather      Current Outpatient Medications:  .  cetirizine (ZYRTEC) 10 MG tablet, Take 10 mg by mouth daily., Disp: , Rfl:  .  Cholecalciferol (D3-1000 PO), Take by mouth., Disp: , Rfl:  .  escitalopram (LEXAPRO) 20 MG tablet, Take 1 tablet by mouth daily., Disp: , Rfl: 2 .  fenofibrate (TRICOR) 145 MG tablet, Take 145 mg by mouth daily., Disp: , Rfl:  .  fluticasone (FLONASE) 50 MCG/ACT nasal spray, Place into both nostrils daily., Disp: , Rfl:  .  levothyroxine (SYNTHROID, LEVOTHROID) 75 MCG tablet, Take 1 tablet by mouth every morning., Disp: , Rfl: 2 .  montelukast (SINGULAIR) 10 MG tablet, Take 1 tablet by mouth every evening., Disp: , Rfl: 5 .  OVER THE COUNTER MEDICATION, [place], Disp: , Rfl:  .  OVER THE COUNTER MEDICATION, Oil for GERD, Disp: , Rfl:  .  PROAIR RESPICLICK 123XX123 (90 Base) MCG/ACT AEPB, Inhale 2 puffs into the lungs every 4 (four) hours as needed., Disp: , Rfl: 0 .  rosuvastatin (CRESTOR) 20 MG tablet, Take 20 mg by mouth at bedtime., Disp: , Rfl:  .  vitamin B-12 (CYANOCOBALAMIN) 500 MCG tablet, Take 500 mcg by mouth daily., Disp: , Rfl:   Review of Systems:  Constitutional: Denies fever, chills, diaphoresis, appetite change and fatigue.  HEENT: Denies photophobia, eye pain, redness, hearing loss, ear pain, congestion, sore throat, rhinorrhea, sneezing, mouth sores, trouble swallowing, neck pain, neck stiffness and tinnitus.   Respiratory: Denies SOB, DOE, cough, chest tightness,   and wheezing.   Cardiovascular: Denies chest pain, palpitations and leg swelling.  Gastrointestinal: Denies nausea, vomiting, abdominal pain, diarrhea, constipation, blood in stool and abdominal distention.  Genitourinary: Denies dysuria, urgency, frequency, hematuria, flank pain and difficulty urinating.  Endocrine: Denies: hot or cold intolerance, sweats, changes in hair or nails, polyuria, polydipsia. Musculoskeletal: Denies myalgias, back pain, joint swelling, arthralgias and gait problem.  Skin: Denies pallor, rash and wound.  Neurological: Denies dizziness, seizures, syncope, weakness, light-headedness, numbness and headaches.  Hematological: Denies adenopathy. Easy bruising, personal or family bleeding history  Psychiatric/Behavioral: Denies suicidal ideation, mood changes, confusion, nervousness, sleep disturbance and agitation    Physical Exam: Vitals:   02/08/19 1028  BP: (!) 160/100  Pulse: 77  Temp: 97.7 F (36.5 C)  TempSrc: Temporal  SpO2: 95%  Weight: 252 lb 1.6 oz (114.4 kg)  Height: 5\' 3"  (1.6 m)   Body mass index is 44.66 kg/m.  Constitutional: NAD, calm, comfortable Eyes: PERRL, lids and conjunctivae normal ENMT: Mucous membranes are moist.  Respiratory: clear to auscultation bilaterally, no wheezing, no crackles. Normal respiratory effort. No accessory muscle use.  Cardiovascular: Regular rate and rhythm, no murmurs / rubs / gallops. No extremity edema. 2+ pedal pulses.   Abdomen: no tenderness, no masses palpated. No hepatosplenomegaly. Bowel sounds positive.  Musculoskeletal: no clubbing / cyanosis. No joint deformity upper and lower extremities. Good ROM, no contractures. Normal muscle tone.  Skin: no rashes, lesions, ulcers. No induration Neurologic: Grossly intact and nonfocal Psychiatric: Normal judgment and insight. Alert and oriented x 3. Normal mood.    Impression and Plan:  Screening for malignant neoplasm of colon  - Plan: Ambulatory referral  to Gastroenterology  Hypothyroidism, unspecified type -Continue Synthroid, check TSH when she returns for physical  Hyperlipidemia, unspecified hyperlipidemia type -Continue Crestor, check TSH when she returns for physical stop  Recurrent major depressive disorder, in full remission ([place]) -Mood is stable on Lexapro, check PHQ-9 when she returns for physical.  Seasonal allergies -Continue Singulair and proair and follow-up as scheduled with allergy center  Morbid obesity ([place]) -Discussed healthy lifestyle, including increased physical activity and better food choices to promote weight loss.  Elevated BP without diagnosis of hypertension -Blood pressure very elevated to 160/100 in the office today, she is not known to have hypertension. -Have advised ambulatory blood pressure monitoring and she will return for physical in the next 6 months to determine if further treatment is necessary.     Patient Instructions  -Nice seeing you today!!  -Check your blood pressure 2-3 a week and bring your numbers in to your next visit.  -Tetanus vaccination today.  -Schedule follow up for your physical as soon as possible. Please come in fasting.     Lelon Frohlich, MD  Primary Care at [facility]

## 2019-02-08 NOTE — Addendum Note (Signed)
Addended by: Westley Hummer B on: 02/08/2019 03:12 PM   Modules accepted: Orders

## 2019-02-08 NOTE — Patient Instructions (Signed)
-  Nice seeing you today!!  -Check your blood pressure 2-3 a week and bring your numbers in to your next visit.  -Tetanus vaccination today.  -Schedule follow up for your physical as soon as possible. Please come in fasting.

## 2019-02-08 NOTE — Addendum Note (Signed)
Addended by: Westley Hummer B on: 02/08/2019 04:51 PM   Modules accepted: Orders

## 2019-02-18 ENCOUNTER — Telehealth: Payer: Self-pay | Admitting: Internal Medicine

## 2019-02-18 NOTE — Telephone Encounter (Signed)
Spoke with patient. Patient denies any headaches, blurred vision or seeing floaters. Patient reports she does have some dizziness but is unsure if its her BP or her vertigo. In office appointment made for 02/20/2019 at 0900. Please advise if anything else is needed

## 2019-02-18 NOTE — Telephone Encounter (Signed)
Patient called wanting Dr Jerilee Hoh to know her BP was running high. Her latest numbers was 175/103 and 188/106. She has an appointment on 03/12/19 but needed to know if she needed to do anything before then.

## 2019-02-19 ENCOUNTER — Encounter: Payer: Self-pay | Admitting: Internal Medicine

## 2019-02-19 ENCOUNTER — Other Ambulatory Visit: Payer: Self-pay

## 2019-02-19 NOTE — Telephone Encounter (Signed)
Appointment tomorrow is great. Thanks.

## 2019-02-20 ENCOUNTER — Ambulatory Visit: Payer: 59 | Admitting: Internal Medicine

## 2019-02-20 ENCOUNTER — Encounter: Payer: Self-pay | Admitting: Internal Medicine

## 2019-02-20 VITALS — BP 160/92 | HR 73 | Temp 97.7°F | Wt 250.5 lb

## 2019-02-20 DIAGNOSIS — E039 Hypothyroidism, unspecified: Secondary | ICD-10-CM

## 2019-02-20 DIAGNOSIS — I10 Essential (primary) hypertension: Secondary | ICD-10-CM

## 2019-02-20 MED ORDER — HYDROCHLOROTHIAZIDE 25 MG PO TABS: 25.0000 mg | ORAL_TABLET | Freq: Every day | ORAL | 1 refills | Status: DC

## 2019-02-20 MED ORDER — LEVOTHYROXINE SODIUM 100 MCG PO TABS: 100.0000 ug | ORAL_TABLET | Freq: Every morning | ORAL | 1 refills | Status: DC

## 2019-02-20 NOTE — Progress Notes (Signed)
Established Patient Office Visit     This visit occurred during the SARS-CoV-2 public health emergency.  Safety protocols were in place, including screening questions prior to the visit, additional usage of staff PPE, and extensive cleaning of exam room while observing appropriate contact time as indicated for disinfecting solutions.    CC/Reason for Visit: Elevated blood pressure readings  HPI: Debbie [name] is a 48 y.o. female who is coming in today for the above mentioned reasons.  She was noticed to have elevated blood pressure at her last appointment.  She was asked to do ambulatory blood pressure monitoring.  She has never been diagnosed with blood pressure in the past.  She has not been having headache, blurry vision, chest pain, shortness of breath or focal neurologic deficits.  I will insert her blood pressure log below:    She also states that when her Synthroid was called in it was 75 mcg instead of 100 and she would like that rectified today.   Past Medical/Surgical History: Past Medical History:  Diagnosis Date  . Depression   . Elevated cholesterol   . Hyperlipidemia   . Hypothyroidism   . Seasonal allergies   . Thyroid disease     Past Surgical History:  Procedure Laterality Date  . ADENOIDECTOMY      Social History:  reports that she has never smoked. She has never used smokeless tobacco. She reports that she does not drink alcohol or use drugs.  Allergies: Allergies  Allergen Reactions  . Atorvastatin Hives    Family History:  Family History  Problem Relation Age of Onset  . Heart disease Father   . Hypertension Mother   . Hyperlipidemia Mother   . Breast cancer Maternal Grandmother   . Prostate cancer Maternal Grandfather   . Diabetes Maternal Grandfather   . CAD Maternal Grandfather      Current Outpatient Medications:  .  cetirizine (ZYRTEC) 10 MG tablet, Take 10 mg by mouth daily., Disp: , Rfl:  .  Cholecalciferol (D3-1000 PO), Take  by mouth., Disp: , Rfl:  .  escitalopram (LEXAPRO) 20 MG tablet, Take 1 tablet (20 mg total) by mouth daily., Disp: 90 tablet, Rfl: 1 .  fenofibrate (TRICOR) 145 MG tablet, Take 1 tablet (145 mg total) by mouth daily., Disp: 90 tablet, Rfl: 1 .  fluticasone (FLONASE) 50 MCG/ACT nasal spray, Place into both nostrils daily., Disp: , Rfl:  .  levothyroxine (SYNTHROID) 100 MCG tablet, Take 1 tablet (100 mcg total) by mouth every morning., Disp: 90 tablet, Rfl: 1 .  montelukast (SINGULAIR) 10 MG tablet, Take 1 tablet by mouth every evening., Disp: , Rfl: 5 .  OVER THE COUNTER MEDICATION, [place], Disp: , Rfl:  .  OVER THE COUNTER MEDICATION, Oil for GERD, Disp: , Rfl:  .  PROAIR RESPICLICK 123XX123 (90 Base) MCG/ACT AEPB, Inhale 2 puffs into the lungs every 4 (four) hours as needed., Disp: , Rfl: 0 .  rosuvastatin (CRESTOR) 20 MG tablet, Take 1 tablet (20 mg total) by mouth at bedtime., Disp: 90 tablet, Rfl: 1 .  vitamin B-12 (CYANOCOBALAMIN) 500 MCG tablet, Take 500 mcg by mouth daily., Disp: , Rfl:  .  hydrochlorothiazide (HYDRODIURIL) 25 MG tablet, Take 1 tablet (25 mg total) by mouth daily., Disp: 90 tablet, Rfl: 1  Review of Systems:  Constitutional: Denies fever, chills, diaphoresis, appetite change and fatigue.  HEENT: Denies photophobia, eye pain, redness, hearing loss, ear pain, congestion, sore throat, rhinorrhea, sneezing, mouth sores,  trouble swallowing, neck pain, neck stiffness and tinnitus.   Respiratory: Denies SOB, DOE, cough, chest tightness,  and wheezing.   Cardiovascular: Denies chest pain, palpitations and leg swelling.  Gastrointestinal: Denies nausea, vomiting, abdominal pain, diarrhea, constipation, blood in stool and abdominal distention.  Genitourinary: Denies dysuria, urgency, frequency, hematuria, flank pain and difficulty urinating.  Endocrine: Denies: hot or cold intolerance, sweats, changes in hair or nails, polyuria, polydipsia. Musculoskeletal: Denies myalgias, back  pain, joint swelling, arthralgias and gait problem.  Skin: Denies pallor, rash and wound.  Neurological: Denies dizziness, seizures, syncope, weakness, light-headedness, numbness and headaches.  Hematological: Denies adenopathy. Easy bruising, personal or family bleeding history  Psychiatric/Behavioral: Denies suicidal ideation, mood changes, confusion, nervousness, sleep disturbance and agitation    Physical Exam: Vitals:   02/20/19 0855  BP: (!) 160/92  Pulse: 73  Temp: 97.7 F (36.5 C)  TempSrc: Temporal  SpO2: 97%  Weight: 250 lb 8 oz (113.6 kg)    Body mass index is 44.37 kg/m.   Constitutional: NAD, calm, comfortable Eyes: PERRL, lids and conjunctivae normal, wears corrective lenses ENMT: Mucous membranes are moist. Respiratory: clear to auscultation bilaterally, no wheezing, no crackles. Normal respiratory effort. No accessory muscle use.  Cardiovascular: Regular rate and rhythm, no murmurs / rubs / gallops. No extremity edema. 2+ pedal pulses.  Abdomen: no tenderness, no masses palpated. No hepatosplenomegaly. Bowel sounds positive.  Musculoskeletal: no clubbing / cyanosis. No joint deformity upper and lower extremities. Good ROM, no contractures. Normal muscle tone.  Skin: no rashes, lesions, ulcers. No induration Neurologic: Grossly intact and nonfocal Psychiatric: Normal judgment and insight. Alert and oriented x 3. Normal mood.    Impression and Plan:  Hypothyroidism, unspecified type  -Refill 100 mcg.  Essential hypertension -I will go ahead and make this diagnosis today. -Discussed importance of low-sodium diet and weight loss. -Start hydrochlorothiazide 25 mg daily, will need follow-up visit in 6 weeks to monitor.  Consider basic metabolic profile then to follow renal function and electrolytes on newly started diuretic.    Patient Instructions  -Nice seeing you today!!  -Start HCTZ 25 mg daily for blood pressure.  -Keep monitoring your BP at home  2-3 times a week and bring those numbers in to your next visit.  -Updated synthroid dose has been sent.  -Schedule follow up in 6 weeks.   DASH Eating Plan DASH stands for "Dietary Approaches to Stop Hypertension." The DASH eating plan is a healthy eating plan that has been shown to reduce high blood pressure (hypertension). It may also reduce your risk for type 2 diabetes, heart disease, and stroke. The DASH eating plan may also help with weight loss. What are tips for following this plan?  General guidelines  Avoid eating more than 2,300 mg (milligrams) of salt (sodium) a day. If you have hypertension, you may need to reduce your sodium intake to 1,500 mg a day.  Limit alcohol intake to no more than 1 drink a day for nonpregnant women and 2 drinks a day for men. One drink equals 12 oz of beer, 5 oz of wine, or 1 oz of hard liquor.  Work with your health care provider to maintain a healthy body weight or to lose weight. Ask what an ideal weight is for you.  Get at least 30 minutes of exercise that causes your heart to beat faster (aerobic exercise) most days of the week. Activities may include walking, swimming, or biking.  Work with your health care provider or diet and  nutrition specialist (dietitian) to adjust your eating plan to your individual calorie needs. Reading food labels   Check food labels for the amount of sodium per serving. Choose foods with less than 5 percent of the Daily Value of sodium. Generally, foods with less than 300 mg of sodium per serving fit into this eating plan.  To find whole grains, look for the word "whole" as the first word in the ingredient list. Shopping  Buy products labeled as "low-sodium" or "no salt added."  Buy fresh foods. Avoid canned foods and premade or frozen meals. Cooking  Avoid adding salt when cooking. Use salt-free seasonings or herbs instead of table salt or sea salt. Check with your health care provider or pharmacist before  using salt substitutes.  Do not fry foods. Cook foods using healthy methods such as baking, boiling, grilling, and broiling instead.  Cook with heart-healthy oils, such as olive, canola, soybean, or sunflower oil. Meal planning  Eat a balanced diet that includes: ? 5 or more servings of fruits and vegetables each day. At each meal, try to fill half of your plate with fruits and vegetables. ? Up to 6-8 servings of whole grains each day. ? Less than 6 oz of lean meat, poultry, or fish each day. A 3-oz serving of meat is about the same size as a deck of cards. One egg equals 1 oz. ? 2 servings of low-fat dairy each day. ? A serving of nuts, seeds, or beans 5 times each week. ? Heart-healthy fats. Healthy fats called Omega-3 fatty acids are found in foods such as flaxseeds and coldwater fish, like sardines, salmon, and mackerel.  Limit how much you eat of the following: ? Canned or prepackaged foods. ? Food that is high in trans fat, such as fried foods. ? Food that is high in saturated fat, such as fatty meat. ? Sweets, desserts, sugary drinks, and other foods with added sugar. ? Full-fat dairy products.  Do not salt foods before eating.  Try to eat at least 2 vegetarian meals each week.  Eat more home-cooked food and less restaurant, buffet, and fast food.  When eating at a restaurant, ask that your food be prepared with less salt or no salt, if possible. What foods are recommended? The items listed may not be a complete list. Talk with your dietitian about what dietary choices are best for you. Grains Whole-grain or whole-wheat bread. Whole-grain or whole-wheat pasta. Brown rice. [place]. Bulgur. Whole-grain and low-sodium cereals. Pita bread. Low-fat, low-sodium crackers. Whole-wheat flour tortillas. Vegetables Fresh or frozen vegetables (raw, steamed, roasted, or grilled). Low-sodium or reduced-sodium tomato and vegetable juice. Low-sodium or reduced-sodium tomato sauce and  tomato paste. Low-sodium or reduced-sodium canned vegetables. Fruits All fresh, dried, or frozen fruit. Canned fruit in natural juice (without added sugar). Meat and other protein foods Skinless chicken or [place]. Ground chicken or [place]. Pork with fat trimmed off. Fish and seafood. Egg whites. Dried beans, peas, or lentils. Unsalted nuts, nut butters, and seeds. Unsalted canned beans. Lean cuts of beef with fat trimmed off. Low-sodium, lean deli meat. Dairy Low-fat (1%) or fat-free (skim) milk. Fat-free, low-fat, or reduced-fat cheeses. Nonfat, low-sodium ricotta or cottage cheese. Low-fat or nonfat yogurt. Low-fat, low-sodium cheese. Fats and oils Soft margarine without trans fats. Vegetable oil. Low-fat, reduced-fat, or light mayonnaise and salad dressings (reduced-sodium). Canola, safflower, olive, soybean, and sunflower oils. Avocado. Seasoning and other foods Herbs. Spices. Seasoning mixes without salt. Unsalted popcorn and pretzels. Fat-free sweets. What  foods are not recommended? The items listed may not be a complete list. Talk with your dietitian about what dietary choices are best for you. Grains Baked goods made with fat, such as croissants, muffins, or some breads. Dry pasta or rice meal packs. Vegetables Creamed or fried vegetables. Vegetables in a cheese sauce. Regular canned vegetables (not low-sodium or reduced-sodium). Regular canned tomato sauce and paste (not low-sodium or reduced-sodium). Regular tomato and vegetable juice (not low-sodium or reduced-sodium). Angie Fava. Olives. Fruits Canned fruit in a light or heavy syrup. Fried fruit. Fruit in cream or butter sauce. Meat and other protein foods Fatty cuts of meat. Ribs. Fried meat. Berniece Salines. Sausage. Bologna and other processed lunch meats. Salami. Fatback. Hotdogs. Bratwurst. Salted nuts and seeds. Canned beans with added salt. Canned or smoked fish. Whole eggs or egg yolks. Chicken or [place] with skin. Dairy Whole or 2%  milk, cream, and half-and-half. Whole or full-fat cream cheese. Whole-fat or sweetened yogurt. Full-fat cheese. Nondairy creamers. Whipped toppings. Processed cheese and cheese spreads. Fats and oils Butter. Stick margarine. Lard. Shortening. Ghee. Bacon fat. Tropical oils, such as coconut, palm kernel, or palm oil. Seasoning and other foods Salted popcorn and pretzels. Onion salt, garlic salt, seasoned salt, table salt, and sea salt. Worcestershire sauce. Tartar sauce. Barbecue sauce. Teriyaki sauce. Soy sauce, including reduced-sodium. Steak sauce. Canned and packaged gravies. Fish sauce. Oyster sauce. Cocktail sauce. Horseradish that you find on the shelf. Ketchup. Mustard. Meat flavorings and tenderizers. Bouillon cubes. Hot sauce and Tabasco sauce. Premade or packaged marinades. Premade or packaged taco seasonings. Relishes. Regular salad dressings. Where to find more information:  National Heart, Lung, and [place]: https://wilson-eaton.com/  American Heart Association: www.heart.org Summary  The DASH eating plan is a healthy eating plan that has been shown to reduce high blood pressure (hypertension). It may also reduce your risk for type 2 diabetes, heart disease, and stroke.  With the DASH eating plan, you should limit salt (sodium) intake to 2,300 mg a day. If you have hypertension, you may need to reduce your sodium intake to 1,500 mg a day.  When on the DASH eating plan, aim to eat more fresh fruits and vegetables, whole grains, lean proteins, low-fat dairy, and heart-healthy fats.  Work with your health care provider or diet and nutrition specialist (dietitian) to adjust your eating plan to your individual calorie needs. This information is not intended to replace advice given to you by your health care provider. Make sure you discuss any questions you have with your health care provider. Document Released: 02/24/2011 Document Revised: 02/17/2017 Document Reviewed: 02/29/2016  Elsevier Patient Education  2020 [place], MD [facility]

## 2019-02-20 NOTE — Patient Instructions (Signed)
-Nice seeing you today!!  -Start HCTZ 25 mg daily for blood pressure.  -Keep monitoring your BP at home 2-3 times a week and bring those numbers in to your next visit.  -Updated synthroid dose has been sent.  -Schedule follow up in 6 weeks.   DASH Eating Plan DASH stands for "Dietary Approaches to Stop Hypertension." The DASH eating plan is a healthy eating plan that has been shown to reduce high blood pressure (hypertension). It may also reduce your risk for type 2 diabetes, heart disease, and stroke. The DASH eating plan may also help with weight loss. What are tips for following this plan?  General guidelines  Avoid eating more than 2,300 mg (milligrams) of salt (sodium) a day. If you have hypertension, you may need to reduce your sodium intake to 1,500 mg a day.  Limit alcohol intake to no more than 1 drink a day for nonpregnant women and 2 drinks a day for men. One drink equals 12 oz of beer, 5 oz of wine, or 1 oz of hard liquor.  Work with your health care provider to maintain a healthy body weight or to lose weight. Ask what an ideal weight is for you.  Get at least 30 minutes of exercise that causes your heart to beat faster (aerobic exercise) most days of the week. Activities may include walking, swimming, or biking.  Work with your health care provider or diet and nutrition specialist (dietitian) to adjust your eating plan to your individual calorie needs. Reading food labels   Check food labels for the amount of sodium per serving. Choose foods with less than 5 percent of the Daily Value of sodium. Generally, foods with less than 300 mg of sodium per serving fit into this eating plan.  To find whole grains, look for the word "whole" as the first word in the ingredient list. Shopping  Buy products labeled as "low-sodium" or "no salt added."  Buy fresh foods. Avoid canned foods and premade or frozen meals. Cooking  Avoid adding salt when cooking. Use salt-free  seasonings or herbs instead of table salt or sea salt. Check with your health care provider or pharmacist before using salt substitutes.  Do not fry foods. Cook foods using healthy methods such as baking, boiling, grilling, and broiling instead.  Cook with heart-healthy oils, such as olive, canola, soybean, or sunflower oil. Meal planning  Eat a balanced diet that includes: ? 5 or more servings of fruits and vegetables each day. At each meal, try to fill half of your plate with fruits and vegetables. ? Up to 6-8 servings of whole grains each day. ? Less than 6 oz of lean meat, poultry, or fish each day. A 3-oz serving of meat is about the same size as a deck of cards. One egg equals 1 oz. ? 2 servings of low-fat dairy each day. ? A serving of nuts, seeds, or beans 5 times each week. ? Heart-healthy fats. Healthy fats called Omega-3 fatty acids are found in foods such as flaxseeds and coldwater fish, like sardines, salmon, and mackerel.  Limit how much you eat of the following: ? Canned or prepackaged foods. ? Food that is high in trans fat, such as fried foods. ? Food that is high in saturated fat, such as fatty meat. ? Sweets, desserts, sugary drinks, and other foods with added sugar. ? Full-fat dairy products.  Do not salt foods before eating.  Try to eat at least 2 vegetarian meals each week.  Eat more home-cooked food and less restaurant, buffet, and fast food.  When eating at a restaurant, ask that your food be prepared with less salt or no salt, if possible. What foods are recommended? The items listed may not be a complete list. Talk with your dietitian about what dietary choices are best for you. Grains Whole-grain or whole-wheat bread. Whole-grain or whole-wheat pasta. Brown rice. [place]. Bulgur. Whole-grain and low-sodium cereals. Pita bread. Low-fat, low-sodium crackers. Whole-wheat flour tortillas. Vegetables Fresh or frozen vegetables (raw, steamed, roasted, or  grilled). Low-sodium or reduced-sodium tomato and vegetable juice. Low-sodium or reduced-sodium tomato sauce and tomato paste. Low-sodium or reduced-sodium canned vegetables. Fruits All fresh, dried, or frozen fruit. Canned fruit in natural juice (without added sugar). Meat and other protein foods Skinless chicken or [place]. Ground chicken or [place]. Pork with fat trimmed off. Fish and seafood. Egg whites. Dried beans, peas, or lentils. Unsalted nuts, nut butters, and seeds. Unsalted canned beans. Lean cuts of beef with fat trimmed off. Low-sodium, lean deli meat. Dairy Low-fat (1%) or fat-free (skim) milk. Fat-free, low-fat, or reduced-fat cheeses. Nonfat, low-sodium ricotta or cottage cheese. Low-fat or nonfat yogurt. Low-fat, low-sodium cheese. Fats and oils Soft margarine without trans fats. Vegetable oil. Low-fat, reduced-fat, or light mayonnaise and salad dressings (reduced-sodium). Canola, safflower, olive, soybean, and sunflower oils. Avocado. Seasoning and other foods Herbs. Spices. Seasoning mixes without salt. Unsalted popcorn and pretzels. Fat-free sweets. What foods are not recommended? The items listed may not be a complete list. Talk with your dietitian about what dietary choices are best for you. Grains Baked goods made with fat, such as croissants, muffins, or some breads. Dry pasta or rice meal packs. Vegetables Creamed or fried vegetables. Vegetables in a cheese sauce. Regular canned vegetables (not low-sodium or reduced-sodium). Regular canned tomato sauce and paste (not low-sodium or reduced-sodium). Regular tomato and vegetable juice (not low-sodium or reduced-sodium). Angie Fava. Olives. Fruits Canned fruit in a light or heavy syrup. Fried fruit. Fruit in cream or butter sauce. Meat and other protein foods Fatty cuts of meat. Ribs. Fried meat. Berniece Salines. Sausage. Bologna and other processed lunch meats. Salami. Fatback. Hotdogs. Bratwurst. Salted nuts and seeds. Canned beans with  added salt. Canned or smoked fish. Whole eggs or egg yolks. Chicken or [place] with skin. Dairy Whole or 2% milk, cream, and half-and-half. Whole or full-fat cream cheese. Whole-fat or sweetened yogurt. Full-fat cheese. Nondairy creamers. Whipped toppings. Processed cheese and cheese spreads. Fats and oils Butter. Stick margarine. Lard. Shortening. Ghee. Bacon fat. Tropical oils, such as coconut, palm kernel, or palm oil. Seasoning and other foods Salted popcorn and pretzels. Onion salt, garlic salt, seasoned salt, table salt, and sea salt. Worcestershire sauce. Tartar sauce. Barbecue sauce. Teriyaki sauce. Soy sauce, including reduced-sodium. Steak sauce. Canned and packaged gravies. Fish sauce. Oyster sauce. Cocktail sauce. Horseradish that you find on the shelf. Ketchup. Mustard. Meat flavorings and tenderizers. Bouillon cubes. Hot sauce and Tabasco sauce. Premade or packaged marinades. Premade or packaged taco seasonings. Relishes. Regular salad dressings. Where to find more information:  National Heart, Lung, and [place]: https://wilson-eaton.com/  American Heart Association: www.heart.org Summary  The DASH eating plan is a healthy eating plan that has been shown to reduce high blood pressure (hypertension). It may also reduce your risk for type 2 diabetes, heart disease, and stroke.  With the DASH eating plan, you should limit salt (sodium) intake to 2,300 mg a day. If you have hypertension, you may need to reduce your sodium intake  to 1,500 mg a day.  When on the DASH eating plan, aim to eat more fresh fruits and vegetables, whole grains, lean proteins, low-fat dairy, and heart-healthy fats.  Work with your health care provider or diet and nutrition specialist (dietitian) to adjust your eating plan to your individual calorie needs. This information is not intended to replace advice given to you by your health care provider. Make sure you discuss any questions you have with your health care  provider. Document Released: 02/24/2011 Document Revised: 02/17/2017 Document Reviewed: 02/29/2016 Elsevier Patient Education  2020 [employer].

## 2019-03-12 ENCOUNTER — Encounter: Payer: 59 | Admitting: Internal Medicine

## 2019-03-25 ENCOUNTER — Other Ambulatory Visit: Payer: Self-pay

## 2019-03-25 ENCOUNTER — Ambulatory Visit (AMBULATORY_SURGERY_CENTER): Payer: 59 | Admitting: *Deleted

## 2019-03-25 VITALS — Temp 97.1°F | Ht 63.0 in | Wt 253.0 lb

## 2019-03-25 DIAGNOSIS — Z1159 Encounter for screening for other viral diseases: Secondary | ICD-10-CM

## 2019-03-25 DIAGNOSIS — Z1211 Encounter for screening for malignant neoplasm of colon: Secondary | ICD-10-CM

## 2019-03-25 MED ORDER — SUPREP BOWEL PREP KIT 17.5-3.13-1.6 GM/177ML PO SOLN: 1.0000 | Freq: Once | ORAL | 0 refills | Status: AC

## 2019-03-25 NOTE — Progress Notes (Signed)
No egg or soy allergy known to patient  No issues with past sedation with any surgeries  or procedures, no intubation problems  No diet pills per patient No home 02 use per patient  No blood thinners per patient  Pt denies issues with constipation  No A fib or A flutter  EMMI video sent to pt's e mail   Pt will see CARDS 04-02-2019 for elevated BP- Father has aneurysms in heart and pt has F/U with cardio in the past with no issues- Echos and stress test shave always been Negative per pt-  Pt will notify [place] if after she sees CARDS he orders testing to RS colon until after ALL testing complete  Due to the COVID-19 pandemic we are asking patients to follow these guidelines. Please only bring one care partner. Please be aware that your care partner may wait in the car in the parking lot or if they feel like they will be too hot to wait in the car, they may wait in the lobby on the 4th floor. All care partners are required to wear a mask the entire time (we do not have any that we can provide them), they need to practice social distancing, and we will do a Covid check for all patient's and care partners when you arrive. Also we will check their temperature and your temperature. If the care partner waits in their car they need to stay in the parking lot the entire time and we will call them on their cell phone when the patient is ready for discharge so they can bring the car to the front of the building. Also all patient's will need to wear a mask into building. Suprep $15 coupon to pt

## 2019-03-28 ENCOUNTER — Encounter: Payer: Self-pay | Admitting: Internal Medicine

## 2019-04-02 ENCOUNTER — Encounter: Payer: Self-pay | Admitting: Cardiovascular Disease

## 2019-04-02 ENCOUNTER — Ambulatory Visit: Payer: 59 | Admitting: Cardiovascular Disease

## 2019-04-02 ENCOUNTER — Other Ambulatory Visit: Payer: Self-pay

## 2019-04-02 DIAGNOSIS — I10 Essential (primary) hypertension: Secondary | ICD-10-CM

## 2019-04-02 DIAGNOSIS — Z8279 Family history of other congenital malformations, deformations and chromosomal abnormalities: Secondary | ICD-10-CM

## 2019-04-02 NOTE — Patient Instructions (Signed)
Medication Instructions:  Your physician recommends that you continue on your current medications as directed. Please refer to the Current Medication list given to you today.  *If you need a refill on your cardiac medications before your next appointment, please call your pharmacy*   Lab Work: None Ordered    Testing/Procedures: Your physician has requested that you have an echocardiogram. Echocardiography is a painless test that uses sound waves to create images of your heart. It provides your doctor with information about the size and shape of your heart and how well your heart's chambers and valves are working. This procedure takes approximately one hour. There are no restrictions for this procedure.    Follow-Up: At [facility], you and your health needs are our priority.  As part of our continuing mission to provide you with exceptional heart care, we have created designated Provider Care Teams.  These Care Teams include your primary Cardiologist (physician) and Advanced Practice Providers (APPs -  Physician Assistants and Nurse Practitioners) who all work together to provide you with the care you need, when you need it.  Your next appointment:   1 year(s)  The format for your next appointment:   In Person  Provider:   You may see Mertie Moores, MD or one of the following Advanced Practice Providers on your designated Care Team:    Richardson Dopp, PA-C  [place], [place]  Daune Perch, [place]

## 2019-04-02 NOTE — Progress Notes (Signed)
Cardiology Office Note:    Date:  04/02/2019   ID:  Debbie [name], DOB 1970-09-05, MRN [id number]  PCP:  Isaac Bliss, Rayford Halsted, MD  Cardiologist:  Mertie Moores, MD  Electrophysiologist:  None   Referring MD: Isaac Bliss, [place]*   Chief Complaint  Patient presents with  . Hypertension  . Hyperlipidemia    History of Present Illness:    Debbie [name] is a 49 y.o. female with a hx of  HTN, HLD.  Father had a bicuspid AV.   Father Had AVR and ascending aortic aneurism repaired.  Very strong family hx of aortic aneurisms on his fathers side [facility] did SMAD3 gene defect. This is an autosomal dominant inherited defect.  We are asked to see her today by Dr. Jerilee Hoh for further evaluation and management of her hypertension as well as her family history of bicuspid aortic valve and aortic aneurysms.  She is here for screening for bicuspid AV and aortic aneurism scanning   BP has been elevated.  Recently started on HCTZ  Bacon on weekends    She has had an echo in the past in [place].  No report of bicuspid AV   No CP or dyspnea.   Not as much regular exercise  Not current working  [place] on occasion.   Avoid high salt foods.   Is going for lipid and other labs tomorrow    Past Medical History:  Diagnosis Date  . Acute renal failure (ARF) ([place]) 10/01/2015  . Allergy   . Depression   . Elevated cholesterol   . Hyperlipidemia   . Hypertension   . Hypothyroidism   . Morbid obesity ([place]) 02/08/2019  . Seasonal allergies   . Sleep apnea    wears cpap   . Thyroid disease     Past Surgical History:  Procedure Laterality Date  . ADENOIDECTOMY    . DENTAL SURGERY     graft of mouth     Current Medications: Current Meds  Medication Sig  . Ascorbic Acid (VITAMIN C) 1000 MG tablet Take 1,000 mg by mouth daily.  . Azelastine-Fluticasone 137-50 MCG/ACT SUSP as needed.   . cetirizine (ZYRTEC) 10 MG tablet Take 10 mg by mouth daily.  . Cholecalciferol (D3-1000  PO) Take by mouth daily.   Marland Kitchen escitalopram (LEXAPRO) 20 MG tablet Take 1 tablet (20 mg total) by mouth daily.  . fenofibrate (TRICOR) 145 MG tablet Take 1 tablet (145 mg total) by mouth daily.  . hydrochlorothiazide (HYDRODIURIL) 25 MG tablet Take 1 tablet (25 mg total) by mouth daily.  Marland Kitchen levothyroxine (SYNTHROID) 100 MCG tablet Take 1 tablet (100 mcg total) by mouth every morning.  . montelukast (SINGULAIR) 10 MG tablet Take 1 tablet by mouth every evening.  Marland Kitchen OVER THE COUNTER MEDICATION [place]  . OVER THE COUNTER MEDICATION Dandelion Root 525 mg daily  . PROAIR RESPICLICK 123XX123 (90 Base) MCG/ACT AEPB Inhale 2 puffs into the lungs every 4 (four) hours as needed.  . rosuvastatin (CRESTOR) 20 MG tablet Take 1 tablet (20 mg total) by mouth at bedtime.  . vitamin B-12 (CYANOCOBALAMIN) 500 MCG tablet Take 500 mcg by mouth daily.  Marland Kitchen zinc gluconate 50 MG tablet Take 50 mg by mouth daily.     Allergies:   Atorvastatin   Social History   Socioeconomic History  . Marital status: Married    Spouse name: Not on file  . Number of children: Not on file  . Years of education: Not on file  .  Highest education level: Not on file  Occupational History  . Not on file  Tobacco Use  . Smoking status: Never Smoker  . Smokeless tobacco: Never Used  Substance and Sexual Activity  . Alcohol use: No  . Drug use: No  . Sexual activity: Not on file  Other Topics Concern  . Not on file  Social History Narrative  . Not on file   Social Determinants of Health   Financial Resource Strain:   . Difficulty of Paying Living Expenses: Not on file  Food Insecurity:   . Worried About Charity fundraiser in the Last Year: Not on file  . Ran Out of Food in the Last Year: Not on file  Transportation Needs:   . Lack of Transportation (Medical): Not on file  . Lack of Transportation (Non-Medical): Not on file  Physical Activity:   . Days of Exercise per Week: Not on file  . Minutes of Exercise per Session:  Not on file  Stress:   . Feeling of Stress : Not on file  Social Connections:   . Frequency of Communication with Friends and Family: Not on file  . Frequency of Social Gatherings with Friends and Family: Not on file  . Attends Religious Services: Not on file  . Active Member of Clubs or Organizations: Not on file  . Attends Archivist Meetings: Not on file  . Marital Status: Not on file     Family History: The patient's family history includes Breast cancer in her maternal grandmother; CAD in her maternal grandfather; Diabetes in her maternal grandfather; Heart disease in her father; Hyperlipidemia in her mother; Hypertension in her mother; Prostate cancer in her maternal grandfather. There is no history of Colon cancer, Colon polyps, Esophageal cancer, Stomach cancer, or Rectal cancer.  ROS:   Please see the history of present illness.     All other systems reviewed and are negative.  EKGs/Labs/Other Studies Reviewed:    The following studies were reviewed today:     Recent Labs: No results found for requested labs within last 8760 hours.  Recent Lipid Panel No results found for: CHOL, TRIG, HDL, CHOLHDL, VLDL, LDLCALC, LDLDIRECT  Physical Exam:    VS:  BP (!) 150/88   Pulse 78   Ht 5\' 3"  (1.6 m)   Wt 254 lb (115.2 kg)   LMP 03/21/2019   SpO2 98%   BMI 44.99 kg/m     Wt Readings from Last 3 Encounters:  04/02/19 254 lb (115.2 kg)  03/25/19 253 lb (114.8 kg)  02/20/19 250 lb 8 oz (113.6 kg)     GEN:  Pleasant young female  HEENT: Normal NECK: No JVD; No carotid bruits LYMPHATICS: No lymphadenopathy CARDIAC: RRR, no murmurs, rubs, gallops RESPIRATORY:  Clear to auscultation without rales, wheezing or rhonchi  ABDOMEN: Soft, non-tender, non-distended MUSCULOSKELETAL:  No edema; No deformity  SKIN: Warm and dry NEUROLOGIC:  Alert and oriented x 3 PSYCHIATRIC:  Normal affect    EKG:   Jan. 12, 2021:   NSR at 75.   No ST or T wave  abn   ASSESSMENT:    1. Morbid obesity ([place])   2. Essential hypertension   3. Family history of bicuspid aortic valve    PLAN:    In order of problems listed above:  1. 1.  Hypertension: Patient has moderately elevated blood pressure.  She still eats salty foods including bacon and other processed foods.  I strongly encouraged her to  stay away from eating processed foods.  We had some lengthy discussion about dietary changes she can make.  She is on HCTZ.  I suspect that she will need to go on potassium supplementation.  I will defer to Dr. Jerilee Hoh for further management of her hypertension for now.  2.  Family history of bicuspid aortic valve with aortic aneurysm: She has a strong family history of bicuspid aortic valve with aortic aneurysms.  Her father was found to have a SMAD3 gene defect.    Her cardiac exam is normal.  I do think that she needs an echocardiogram to evaluate her for bicuspid valve and dilatation of the aorta.  Also this information to our geneticist, Dr. Broadus John to see if we need to order any specific labs or if screening with an echocardiogram is adequate.  Medication Adjustments/Labs and Tests Ordered: Current medicines are reviewed at length with the patient today.  Concerns regarding medicines are outlined above.  Orders Placed This Encounter  Procedures  . EKG 12-Lead  . ECHOCARDIOGRAM COMPLETE   No orders of the defined types were placed in this encounter.   Patient Instructions  Medication Instructions:  Your physician recommends that you continue on your current medications as directed. Please refer to the Current Medication list given to you today.  *If you need a refill on your cardiac medications before your next appointment, please call your pharmacy*   Lab Work: None Ordered    Testing/Procedures: Your physician has requested that you have an echocardiogram. Echocardiography is a painless test that uses sound waves to create images of  your heart. It provides your doctor with information about the size and shape of your heart and how well your heart's chambers and valves are working. This procedure takes approximately one hour. There are no restrictions for this procedure.    Follow-Up: At [facility], you and your health needs are our priority.  As part of our continuing mission to provide you with exceptional heart care, we have created designated Provider Care Teams.  These Care Teams include your primary Cardiologist (physician) and Advanced Practice Providers (APPs -  Physician Assistants and Nurse Practitioners) who all work together to provide you with the care you need, when you need it.  Your next appointment:   1 year(s)  The format for your next appointment:   In Person  Provider:   You may see Mertie Moores, MD or one of the following Advanced Practice Providers on your designated Care Team:    Richardson Dopp, PA-C  [place], [place]  Daune Perch, [place]       Signed, Mertie Moores, MD  04/02/2019 10:50 AM    [place]

## 2019-04-03 ENCOUNTER — Encounter: Payer: Self-pay | Admitting: Internal Medicine

## 2019-04-03 ENCOUNTER — Ambulatory Visit (INDEPENDENT_AMBULATORY_CARE_PROVIDER_SITE_OTHER): Payer: 59 | Admitting: Internal Medicine

## 2019-04-03 VITALS — BP 130/90 | HR 69 | Temp 98.5°F | Ht 65.0 in | Wt 254.4 lb

## 2019-04-03 DIAGNOSIS — Z0000 Encounter for general adult medical examination without abnormal findings: Secondary | ICD-10-CM

## 2019-04-03 DIAGNOSIS — E039 Hypothyroidism, unspecified: Secondary | ICD-10-CM | POA: Diagnosis not present

## 2019-04-03 DIAGNOSIS — I10 Essential (primary) hypertension: Secondary | ICD-10-CM

## 2019-04-03 DIAGNOSIS — E785 Hyperlipidemia, unspecified: Secondary | ICD-10-CM

## 2019-04-03 DIAGNOSIS — R7302 Impaired glucose tolerance (oral): Secondary | ICD-10-CM | POA: Insufficient documentation

## 2019-04-03 DIAGNOSIS — F3342 Major depressive disorder, recurrent, in full remission: Secondary | ICD-10-CM

## 2019-04-03 LAB — COMPREHENSIVE METABOLIC PANEL
ALT: 30 U/L (ref 0–35)
AST: 29 U/L (ref 0–37)
Albumin: 4.5 g/dL (ref 3.5–5.2)
Alkaline Phosphatase: 34 U/L — ABNORMAL LOW (ref 39–117)
BUN: 16 mg/dL (ref 6–23)
CO2: 29 mEq/L (ref 19–32)
Calcium: 10 mg/dL (ref 8.4–10.5)
Chloride: 100 mEq/L (ref 96–112)
Creatinine, Ser: 0.89 mg/dL (ref 0.40–1.20)
GFR: 67.53 mL/min (ref >60.00)
Glucose, Bld: 109 mg/dL — ABNORMAL HIGH (ref 70–99)
Potassium: 3.9 mEq/L (ref 3.5–5.1)
Sodium: 139 mEq/L (ref 135–145)
Total Bilirubin: 0.4 mg/dL (ref 0.2–1.2)
Total Protein: 7.2 g/dL (ref 6.0–8.3)

## 2019-04-03 LAB — CBC WITH DIFFERENTIAL/PLATELET
Basophils Absolute: 0 10*3/uL (ref 0.0–0.1)
Basophils Relative: 0.6 % (ref 0.0–3.0)
Eosinophils Absolute: 0.2 10*3/uL (ref 0.0–0.7)
Eosinophils Relative: 3.5 % (ref 0.0–5.0)
HCT: 37.4 % (ref 36.0–46.0)
Hemoglobin: 12.9 g/dL (ref 12.0–15.0)
Lymphocytes Relative: 40.9 % (ref 12.0–46.0)
Lymphs Abs: 2.9 10*3/uL (ref 0.7–4.0)
MCHC: 34.6 g/dL (ref 30.0–36.0)
MCV: 91.5 fl (ref 78.0–100.0)
Monocytes Absolute: 0.5 10*3/uL (ref 0.1–1.0)
Monocytes Relative: 6.8 % (ref 3.0–12.0)
Neutro Abs: 3.4 10*3/uL (ref 1.4–7.7)
Neutrophils Relative %: 48.2 % (ref 43.0–77.0)
Platelets: 335 10*3/uL (ref 150.0–400.0)
RBC: 4.08 Mil/uL (ref 3.87–5.11)
RDW: 13.2 % (ref 11.5–15.5)
WBC: 7 10*3/uL (ref 4.0–10.5)

## 2019-04-03 LAB — LIPID PANEL
Cholesterol: 183 mg/dL (ref 0–200)
HDL: 47.1 mg/dL (ref >39.00)
LDL Cholesterol: 100 mg/dL — ABNORMAL HIGH (ref 0–99)
NonHDL: 135.77
Total CHOL/HDL Ratio: 4
Triglycerides: 177 mg/dL — ABNORMAL HIGH (ref 0.0–149.0)
VLDL: 35.4 mg/dL (ref 0.0–40.0)

## 2019-04-03 LAB — VITAMIN B12: Vitamin B-12: 828 pg/mL (ref 211–911)

## 2019-04-03 LAB — HEMOGLOBIN A1C: Hgb A1c MFr Bld: 6 % (ref 4.6–6.5)

## 2019-04-03 LAB — VITAMIN D 25 HYDROXY (VIT D DEFICIENCY, FRACTURES): VITD: 56.53 ng/mL (ref 30.00–100.00)

## 2019-04-03 LAB — TSH: TSH: 2.04 u[IU]/mL (ref 0.35–4.50)

## 2019-04-03 MED ORDER — HYDROCHLOROTHIAZIDE 25 MG PO TABS: 25.0000 mg | ORAL_TABLET | Freq: Every day | ORAL | 1 refills | Status: DC

## 2019-04-03 MED ORDER — ROSUVASTATIN CALCIUM 20 MG PO TABS: 20.0000 mg | ORAL_TABLET | Freq: Every day | ORAL | 1 refills | Status: DC

## 2019-04-03 MED ORDER — ESCITALOPRAM OXALATE 20 MG PO TABS: 20.0000 mg | ORAL_TABLET | Freq: Every day | ORAL | 1 refills | Status: DC

## 2019-04-03 MED ORDER — FENOFIBRATE 145 MG PO TABS: 145.0000 mg | ORAL_TABLET | Freq: Every day | ORAL | 1 refills | Status: DC

## 2019-04-03 MED ORDER — LISINOPRIL 10 MG PO TABS: 10.0000 mg | ORAL_TABLET | Freq: Every day | ORAL | 1 refills | Status: DC

## 2019-04-03 NOTE — Progress Notes (Signed)
Established Patient Office Visit     This visit occurred during the SARS-CoV-2 public health emergency.  Safety protocols were in place, including screening questions prior to the visit, additional usage of staff PPE, and extensive cleaning of exam room while observing appropriate contact time as indicated for disinfecting solutions.    CC/Reason for Visit: Annual preventive exam and blood pressure check  HPI: Debbie [name] is a 49 y.o. female who is coming in today for the above mentioned reasons. Past Medical History is significant for: Newly diagnosed hypertension recently started on hydrochlorothiazide 6 weeks ago here for blood pressure check.  She brings in her blood pressure log, systolics have been in the 130s to 269S with diastolics in the 85I.  She also has a history of hypothyroidism, depression, asthma followed by the allergy center and hyperlipidemia as well as morbid obesity.  She has no acute complaints today.  She is fasting.  She has routine eye and dental care.  She does not do much in the way of exercising.  We had scheduled her for her screening colonoscopy, however she states her insurance will not pay for it until the age of 34 despite recent change in best practice guidelines, she had a mammogram and GYN exam with Pap smear in October 2020 which were both normal.   Past Medical/Surgical History: Past Medical History:  Diagnosis Date  . Acute renal failure (ARF) ([place]) 10/01/2015  . Allergy   . Depression   . Elevated cholesterol   . Hyperlipidemia   . Hypertension   . Hypothyroidism   . Morbid obesity ([place]) 02/08/2019  . Seasonal allergies   . Sleep apnea    wears cpap   . Thyroid disease     Past Surgical History:  Procedure Laterality Date  . ADENOIDECTOMY    . DENTAL SURGERY     graft of mouth     Social History:  reports that she has never smoked. She has never used smokeless tobacco. She reports that she does not drink alcohol or use  drugs.  Allergies: Allergies  Allergen Reactions  . Atorvastatin Hives    Family History:  Family History  Problem Relation Age of Onset  . Heart disease Father   . Hypertension Mother   . Hyperlipidemia Mother   . Breast cancer Maternal Grandmother   . Prostate cancer Maternal Grandfather   . Diabetes Maternal Grandfather   . CAD Maternal Grandfather   . Colon cancer Neg Hx   . Colon polyps Neg Hx   . Esophageal cancer Neg Hx   . Stomach cancer Neg Hx   . Rectal cancer Neg Hx      Current Outpatient Medications:  .  Azelastine-Fluticasone 137-50 MCG/ACT SUSP, as needed. , Disp: , Rfl:  .  cetirizine (ZYRTEC) 10 MG tablet, Take 10 mg by mouth daily., Disp: , Rfl:  .  Cholecalciferol (D3-1000 PO), Take by mouth daily. , Disp: , Rfl:  .  levothyroxine (SYNTHROID) 100 MCG tablet, Take 1 tablet (100 mcg total) by mouth every morning., Disp: 90 tablet, Rfl: 1 .  montelukast (SINGULAIR) 10 MG tablet, Take 1 tablet by mouth every evening., Disp: , Rfl: 5 .  OVER THE COUNTER MEDICATION, [place], Disp: , Rfl:  .  OVER THE COUNTER MEDICATION, [place]andel[place] Root 525 mg daily, Disp: , Rfl:  .  PROAIR RESPICLICK 627 (90 Base) MCG/ACT AEPB, Inhale 2 puffs into the lungs every 4 (four) hours as needed., Disp: , Rfl: 0 .  vitamin B-12 (CYANOCOBALAMIN) 500 MCG tablet, Take 500 mcg by mouth daily., Disp: , Rfl:  .  zinc gluconate 50 MG tablet, Take 50 mg by mouth daily., Disp: , Rfl:  .  Ascorbic Acid (VITAMIN C) 1000 MG tablet, Take 1,000 mg by mouth daily., Disp: , Rfl:  .  escitalopram (LEXAPRO) 20 MG tablet, Take 1 tablet (20 mg total) by mouth daily., Disp: 90 tablet, Rfl: 1 .  fenofibrate (TRICOR) 145 MG tablet, Take 1 tablet (145 mg total) by mouth daily., Disp: 90 tablet, Rfl: 1 .  hydrochlorothiazide (HYDRODIURIL) 25 MG tablet, Take 1 tablet (25 mg total) by mouth daily., Disp: 90 tablet, Rfl: 1 .  lisinopril (ZESTRIL) 10 MG tablet, Take 1 tablet (10 mg total) by mouth daily., Disp: 90  tablet, Rfl: 1 .  rosuvastatin (CRESTOR) 20 MG tablet, Take 1 tablet (20 mg total) by mouth at bedtime., Disp: 90 tablet, Rfl: 1  Review of Systems:  Constitutional: Denies fever, chills, diaphoresis, appetite change and fatigue.  HEENT: Denies photophobia, eye pain, redness, hearing loss, ear pain, congestion, sore throat, rhinorrhea, sneezing, mouth sores, trouble swallowing, neck pain, neck stiffness and tinnitus.   Respiratory: Denies SOB, DOE, cough, chest tightness,  and wheezing.   Cardiovascular: Denies chest pain, palpitations and leg swelling.  Gastrointestinal: Denies nausea, vomiting, abdominal pain, diarrhea, constipation, blood in stool and abdominal distention.  Genitourinary: Denies dysuria, urgency, frequency, hematuria, flank pain and difficulty urinating.  Endocrine: Denies: hot or cold intolerance, sweats, changes in hair or nails, polyuria, polydipsia. Musculoskeletal: Denies myalgias, back pain, joint swelling, arthralgias and gait problem.  Skin: Denies pallor, rash and wound.  Neurological: Denies dizziness, seizures, syncope, weakness, light-headedness, numbness and headaches.  Hematological: Denies adenopathy. Easy bruising, personal or family bleeding history  Psychiatric/Behavioral: Denies suicidal ideation, mood changes, confusion, nervousness, sleep disturbance and agitation    Physical Exam: Vitals:   04/03/19 0703  BP: 130/90  Pulse: 69  Temp: 98.5 F (36.9 C)  TempSrc: Temporal  SpO2: 96%  Weight: 254 lb 6.4 oz (115.4 kg)  Height: 5' 5" (1.651 m)    Body mass index is 42.33 kg/m.   Constitutional: NAD, calm, comfortable Eyes: PERRL, lids and conjunctivae normal, wears corrective lenses ENMT: Mucous membranes are moist.Tympanic membrane is pearly white, no erythema or bulging. Neck: normal, supple, no masses, no thyromegaly Respiratory: clear to auscultation bilaterally, no wheezing, no crackles. Normal respiratory effort. No accessory muscle  use.  Cardiovascular: Regular rate and rhythm, no murmurs / rubs / gallops. No extremity edema. 2+ pedal pulses.  Abdomen: no tenderness, no masses palpated. No hepatosplenomegaly. Bowel sounds positive.  Musculoskeletal: no clubbing / cyanosis. No joint deformity upper and lower extremities. Good ROM, no contractures. Normal muscle tone.  Skin: no rashes, lesions, ulcers. No induration Neurologic: CN 2-12 grossly intact. Sensation intact, DTR normal. Strength 5/5 in all 4.  Psychiatric: Normal judgment and insight. Alert and oriented x 3. Normal mood.    Impression and Plan:  Encounter for preventive health examination -She has routine eye and dental care. -Immunizations are up-to-date and age-appropriate. -Screening labs today. -Healthy lifestyle has been discussed in detail. -She had breast and cervical cancer screening in October 2020 with her GYN. -She has elected to defer colonoscopy until age 29 as, despite best practice guidelines, her insurance will not pay for colonoscopy now.  Essential hypertension  -Improved but still not at goal. -Add lisinopril 10 mg daily to hydrochlorothiazide 25 mg.  She will return in 8 weeks for  follow-up  Hypothyroidism, unspecified type  - Plan: TSH -Continue Synthroid.  Hyperlipidemia, unspecified hyperlipidemia type  -She is on both Crestor and fenofibrate. -Check lipids today.  Morbid obesity ([place])  -Discussed healthy lifestyle, including increased physical activity and better food choices to promote weight loss.  Recurrent major depressive disorder, in full remission [facility])     Office Visit from 02/08/2019 in [place] at [facility]  PHQ-9 Total Score  3     -Mood is stable, refill Lexapro.   Patient Instructions  -Nice seeing you today!!  -Lab work today; will notify you once results are available.  -Start lisinopril 10 mg daily.  -Schedule follow up in 8 weeks for blood pressure check.   Preventive Care 23-59  Years Old, Female Preventive care refers to visits with your health care provider and lifestyle choices that can promote health and wellness. This includes:  A yearly physical exam. This may also be called an annual well check.  Regular dental visits and eye exams.  Immunizations.  Screening for certain conditions.  Healthy lifestyle choices, such as eating a healthy diet, getting regular exercise, not using drugs or products that contain nicotine and tobacco, and limiting alcohol use. What can I expect for my preventive care visit? Physical exam Your health care provider will check your:  Height and weight. This may be used to calculate body mass index (BMI), which tells if you are at a healthy weight.  Heart rate and blood pressure.  Skin for abnormal spots. Counseling Your health care provider may ask you questions about your:  Alcohol, tobacco, and drug use.  Emotional well-being.  Home and relationship well-being.  Sexual activity.  Eating habits.  Work and work Statistician.  Method of birth control.  Menstrual cycle.  Pregnancy history. What immunizations do I need?  Influenza (flu) vaccine  This is recommended every year. Tetanus, diphtheria, and pertussis (Tdap) vaccine  You may need a Td booster every 10 years. Varicella (chickenpox) vaccine  You may need this if you have not been vaccinated. Zoster (shingles) vaccine  You may need this after age 42. Measles, mumps, and rubella (MMR) vaccine  You may need at least one dose of MMR if you were born in 1957 or later. You may also need a second dose. Pneumococcal conjugate (PCV13) vaccine  You may need this if you have certain conditions and were not previously vaccinated. Pneumococcal polysaccharide (PPSV23) vaccine  You may need one or two doses if you smoke cigarettes or if you have certain conditions. Meningococcal conjugate (MenACWY) vaccine  You may need this if you have certain  conditions. Hepatitis A vaccine  You may need this if you have certain conditions or if you travel or work in places where you may be exposed to hepatitis A. Hepatitis B vaccine  You may need this if you have certain conditions or if you travel or work in places where you may be exposed to hepatitis B. Haemophilus influenzae type b (Hib) vaccine  You may need this if you have certain conditions. Human papillomavirus (HPV) vaccine  If recommended by your health care provider, you may need three doses over 6 months. You may receive vaccines as individual doses or as more than one vaccine together in one shot (combination vaccines). Talk with your health care provider about the risks and benefits of combination vaccines. What tests do I need? Blood tests  Lipid and cholesterol levels. These may be checked every 5 years, or more frequently if you are  over 52 years old.  Hepatitis C test.  Hepatitis B test. Screening  Lung cancer screening. You may have this screening every year starting at age 77 if you have a 30-pack-year history of smoking and currently smoke or have quit within the past 15 years.  Colorectal cancer screening. All adults should have this screening starting at age 19 and continuing until age 74. Your health care provider may recommend screening at age 33 if you are at increased risk. You will have tests every 1-10 years, depending on your results and the type of screening test.  Diabetes screening. This is done by checking your blood sugar (glucose) after you have not eaten for a while (fasting). You may have this done every 1-3 years.  Mammogram. This may be done every 1-2 years. Talk with your health care provider about when you should start having regular mammograms. This may depend on whether you have a family history of breast cancer.  BRCA-related cancer screening. This may be done if you have a family history of breast, ovarian, tubal, or peritoneal  cancers.  Pelvic exam and Pap test. This may be done every 3 years starting at age 48. Starting at age 58, this may be done every 5 years if you have a Pap test in combination with an HPV test. Other tests  Sexually transmitted disease (STD) testing.  Bone density scan. This is done to screen for osteoporosis. You may have this scan if you are at high risk for osteoporosis. Follow these instructions at home: Eating and drinking  Eat a diet that includes fresh fruits and vegetables, whole grains, lean protein, and low-fat dairy.  Take vitamin and mineral supplements as recommended by your health care provider.  Do not drink alcohol if: ? Your health care provider tells you not to drink. ? You are pregnant, may be pregnant, or are planning to become pregnant.  If you drink alcohol: ? Limit how much you have to 0-1 drink a day. ? Be aware of how much alcohol is in your drink. In the [place] one drink equals one 12 oz bottle of beer (355 mL), one 5 oz glass of wine (148 mL), or one 1 oz glass of hard liquor (44 mL). Lifestyle  Take daily care of your teeth and gums.  Stay active. Exercise for at least 30 minutes on 5 or more days each week.  Do not use any products that contain nicotine or tobacco, such as cigarettes, e-cigarettes, and chewing tobacco. If you need help quitting, ask your health care provider.  If you are sexually active, practice safe sex. Use a condom or other form of birth control (contraception) in order to prevent pregnancy and STIs (sexually transmitted infections).  If told by your health care provider, take low-dose aspirin daily starting at age 46. What's next?  Visit your health care provider once a year for a well check visit.  Ask your health care provider how often you should have your eyes and teeth checked.  Stay up to date on all vaccines. This information is not intended to replace advice given to you by your health care provider. Make sure you  discuss any questions you have with your health care provider. Document Revised: 11/16/2017 Document Reviewed: 11/16/2017 Elsevier Patient Education  2020 [place], [place] [facility]

## 2019-04-03 NOTE — Patient Instructions (Signed)
-Nice seeing you today!!  -Lab work today; will notify you once results are available.  -Start lisinopril 10 mg daily.  -Schedule follow up in 8 weeks for blood pressure check.   Preventive Care 55-49 Years Old, Female Preventive care refers to visits with your health care provider and lifestyle choices that can promote health and wellness. This includes:  A yearly physical exam. This may also be called an annual well check.  Regular dental visits and eye exams.  Immunizations.  Screening for certain conditions.  Healthy lifestyle choices, such as eating a healthy diet, getting regular exercise, not using drugs or products that contain nicotine and tobacco, and limiting alcohol use. What can I expect for my preventive care visit? Physical exam Your health care provider will check your:  Height and weight. This may be used to calculate body mass index (BMI), which tells if you are at a healthy weight.  Heart rate and blood pressure.  Skin for abnormal spots. Counseling Your health care provider may ask you questions about your:  Alcohol, tobacco, and drug use.  Emotional well-being.  Home and relationship well-being.  Sexual activity.  Eating habits.  Work and work Statistician.  Method of birth control.  Menstrual cycle.  Pregnancy history. What immunizations do I need?  Influenza (flu) vaccine  This is recommended every year. Tetanus, diphtheria, and pertussis (Tdap) vaccine  You may need a Td booster every 10 years. Varicella (chickenpox) vaccine  You may need this if you have not been vaccinated. Zoster (shingles) vaccine  You may need this after age 5. Measles, mumps, and rubella (MMR) vaccine  You may need at least one dose of MMR if you were born in 1957 or later. You may also need a second dose. Pneumococcal conjugate (PCV13) vaccine  You may need this if you have certain conditions and were not previously vaccinated. Pneumococcal  polysaccharide (PPSV23) vaccine  You may need one or two doses if you smoke cigarettes or if you have certain conditions. Meningococcal conjugate (MenACWY) vaccine  You may need this if you have certain conditions. Hepatitis A vaccine  You may need this if you have certain conditions or if you travel or work in places where you may be exposed to hepatitis A. Hepatitis B vaccine  You may need this if you have certain conditions or if you travel or work in places where you may be exposed to hepatitis B. Haemophilus influenzae type b (Hib) vaccine  You may need this if you have certain conditions. Human papillomavirus (HPV) vaccine  If recommended by your health care provider, you may need three doses over 6 months. You may receive vaccines as individual doses or as more than one vaccine together in one shot (combination vaccines). Talk with your health care provider about the risks and benefits of combination vaccines. What tests do I need? Blood tests  Lipid and cholesterol levels. These may be checked every 5 years, or more frequently if you are over 28 years old.  Hepatitis C test.  Hepatitis B test. Screening  Lung cancer screening. You may have this screening every year starting at age 25 if you have a 30-pack-year history of smoking and currently smoke or have quit within the past 15 years.  Colorectal cancer screening. All adults should have this screening starting at age 40 and continuing until age 31. Your health care provider may recommend screening at age 68 if you are at increased risk. You will have tests every 1-10 years, depending  on your results and the type of screening test.  Diabetes screening. This is done by checking your blood sugar (glucose) after you have not eaten for a while (fasting). You may have this done every 1-3 years.  Mammogram. This may be done every 1-2 years. Talk with your health care provider about when you should start having regular  mammograms. This may depend on whether you have a family history of breast cancer.  BRCA-related cancer screening. This may be done if you have a family history of breast, ovarian, tubal, or peritoneal cancers.  Pelvic exam and Pap test. This may be done every 3 years starting at age 34. Starting at age 67, this may be done every 5 years if you have a Pap test in combination with an HPV test. Other tests  Sexually transmitted disease (STD) testing.  Bone density scan. This is done to screen for osteoporosis. You may have this scan if you are at high risk for osteoporosis. Follow these instructions at home: Eating and drinking  Eat a diet that includes fresh fruits and vegetables, whole grains, lean protein, and low-fat dairy.  Take vitamin and mineral supplements as recommended by your health care provider.  Do not drink alcohol if: ? Your health care provider tells you not to drink. ? You are pregnant, may be pregnant, or are planning to become pregnant.  If you drink alcohol: ? Limit how much you have to 0-1 drink a day. ? Be aware of how much alcohol is in your drink. In the [place] one drink equals one 12 oz bottle of beer (355 mL), one 5 oz glass of wine (148 mL), or one 1 oz glass of hard liquor (44 mL). Lifestyle  Take daily care of your teeth and gums.  Stay active. Exercise for at least 30 minutes on 5 or more days each week.  Do not use any products that contain nicotine or tobacco, such as cigarettes, e-cigarettes, and chewing tobacco. If you need help quitting, ask your health care provider.  If you are sexually active, practice safe sex. Use a condom or other form of birth control (contraception) in order to prevent pregnancy and STIs (sexually transmitted infections).  If told by your health care provider, take low-dose aspirin daily starting at age 64. What's next?  Visit your health care provider once a year for a well check visit.  Ask your health care provider  how often you should have your eyes and teeth checked.  Stay up to date on all vaccines. This information is not intended to replace advice given to you by your health care provider. Make sure you discuss any questions you have with your health care provider. Document Revised: 11/16/2017 Document Reviewed: 11/16/2017 Elsevier Patient Education  2020 [employer].

## 2019-04-08 ENCOUNTER — Encounter: Payer: 59 | Admitting: Internal Medicine

## 2019-04-15 ENCOUNTER — Ambulatory Visit (HOSPITAL_COMMUNITY): Payer: 59 | Attending: Cardiology

## 2019-04-15 ENCOUNTER — Other Ambulatory Visit: Payer: Self-pay

## 2019-04-15 DIAGNOSIS — Z8249 Family history of ischemic heart disease and other diseases of the circulatory system: Secondary | ICD-10-CM | POA: Diagnosis not present

## 2019-04-15 DIAGNOSIS — Z8279 Family history of other congenital malformations, deformations and chromosomal abnormalities: Secondary | ICD-10-CM | POA: Insufficient documentation

## 2019-04-15 DIAGNOSIS — I10 Essential (primary) hypertension: Secondary | ICD-10-CM | POA: Insufficient documentation

## 2019-04-15 DIAGNOSIS — E785 Hyperlipidemia, unspecified: Secondary | ICD-10-CM | POA: Diagnosis not present

## 2019-04-18 ENCOUNTER — Telehealth: Payer: Self-pay | Admitting: Nurse Practitioner

## 2019-04-18 DIAGNOSIS — Z8279 Family history of other congenital malformations, deformations and chromosomal abnormalities: Secondary | ICD-10-CM

## 2019-04-18 DIAGNOSIS — I10 Essential (primary) hypertension: Secondary | ICD-10-CM

## 2019-04-18 NOTE — Telephone Encounter (Signed)
Echo results reviewed with patient who verbalized understanding. She is agreeable to appointment with Dr. Broadus John for genetic evaluation. She thanked me for the call.

## 2019-04-18 NOTE — Telephone Encounter (Signed)
-----   Message from Debbe Mounts, PhD sent at 04/16/2019  8:15 AM EST ----- Regarding: GC consult schedule Sounds good!  Christine, the 10 am slot is open for the Feb clinic. Could we schedule her for that slot or for the next available genetics clinic per the patient's convenience.  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   I have referred her to Dr. Lattie Corns for genetic evaluation .

## 2019-05-02 ENCOUNTER — Ambulatory Visit: Payer: 59 | Admitting: Genetic Counselor

## 2019-05-02 ENCOUNTER — Other Ambulatory Visit: Payer: Self-pay

## 2019-05-09 NOTE — Progress Notes (Signed)
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   This note also includes the interpretation of her father's genetic test result.  Test Report Genetic testing performed by [facility] (Report date- 10/312019, ID #[id number]) identified a variant of unknown significance in her father. He was found to be heterozygous for c.1033A>C, p.N345H in SMAD3 gene that encodes a signaling protein in the transforming growth factor beta (TGF-b) pathway. Mutations in SMAD3 are implicated in Loeys-Dietz syndrome (LDS) and Familial Thoracic Aortic Aneurysm and dissections (FTAAD).  The SMAD3 c.1033A>C, p.N345H is a novel variant that has not been reported in patients with aortopathies. Additionally, this variant is not found in the population databases, indicating that this is a very rare variant. It is highly conserved through evolution and computational algorithms predict a deleterious effect of N345H variant on protein function. Thus, the molecular characteristic of the SMAD3 N345H variant is indicative of a pathogenic variant.   Test Interpretation and Recommendations Since additional functional studies and disease population studies are needed to verify the pathogenicity of this variant, the SMAD3 N345H variant is considered to be a Variant of Unknown Significance. Another variant at this location, N345K has also been reported as a variant of unknown significance.  If the SMAD3 N345H variant is found to segregate with disease in her family, then the variant can be reclassified as a Likely Pathogenic variant.  Family history Debbie's father (II.5) was found to have a bicuspid aortic valve and thoracic aortic aneurysm of 4.2 cm at age 40 subsequent to complains of worsening dyspnea. She reports that his aneurysm grew rapidly to 5 cm in 3-4 years.  At age 21, the aneurysm was replaced by a mechanical valve. He had no issues until September 2019 when he woke up one day with acute pain radiating from his chest to his back and difficulty breathing. 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 Two more new aneurysms were also detected. The dissection was not repaired and she states that he was being monitored every 3-4 months. About a year later, in October 2020, he underwent surgery to repair the dissection as the blood vessel had dissected all the way to his foot. She informs me that he has not done well since the surgery and now has several physical limitations. He has dyspnea, loses his balance and cannot walk well by himself.  She reports that genetic testing for aortopathies was initiated at [facility]. 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    Debbie [name]'s father is the second child of his parents. His older sister (II.4) and younger siblings (II.1-II.3) are all in good health and have no issues. After the [facility] N345H variant was identified, his siblings were evaluated for aneurysms in 2020, none were reported. They have not undergone genetic testing for the SMAD3 familial variant.   Debbie [name]'s paternal grandfather (I.8) had an abdominal aortic aneurysm in his 58s that was repaired immediately. He did not have a bicuspid aortic valve. He succumbed to Alzheimer's disease at age 33. Debbie [name] reports that her grandfather had 5 brothers (I.3-I.7); all died in their  57s and 70s from an abdominal aortic aneurysm and dissection. His two sisters (I.1-I.2) also passed away from a brain aneurysm in their 37s. She does not the current status of their children or grandchildren.  Debbie [name] has a younger brother (I.2), age 83. He was screened for aneurysms when it  was first detected in their dad. He has thee healthy boys, ages 24, 44 and 70 (IV.1-IV.3)  Traditional Risk Factors Debbie [name]'s father does not have HTN. He does have a bicuspid aortic valve (BAV).   Personal Medical Information Debbie [name] (III.1 on pedigree) is a pleasant 49 year old Caucasian woman. She recently had a normal echocardiogram screen. She does report having hypertension that is controlled by medication and having elevated cholesterol.  Impression  In summary, Debbie [name] is asymptomatic and had a normal echocardiogram screen. She reports significant family history of thoracic and abdominal aortic aneurysm and dissections as well as brain anuerysms in her paternal relatives. In light of this, her family history is highly suggestive of Familial Thoracic Aortic Aneurysms and Dissection (FTAAD), a familial condition that presents primarily as thoracic aortic aneurysms and dissections with some individuals developing brain or abdominal aneurysms.   However, her father has a bicuspid aortic valve. 20% to 50% of BAV patients have a thoracic aortic aneurysm and have an eight-fold higher risk of aortic dissection than the general population. At this time it is unclear if her father's presentation is largely due to his bicuspid aortic valve or if the SMAD3 N345H variant is responsible/modifies his risk for the aneurysms and dissections.   If the SMAD3 N345H variant is found to segregate with disease in her family, then the variant can be reclassified as a Likely Pathogenic variant. However, all her father's siblings had a recent normal screen for aortic aneurysms and genetic testing at this time will not yield clinically actionable information.    In addition, we discussed the protections afforded by the Genetic Information Non-Discrimination Act (GINA). I explained to her that GINA protects her from losing her employment or health insurance because of an underlying genetic condition. However, these protections do not  cover life insurance and disability. 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  Please note that the patient has not been counseled in this visit on personal, cultural or ethical issues that she may face due to her heart condition.   Plan Debbie [name] is currently asymptomatic and has been recently screened for structural defects in the heart. It would be prudent to have a CT scan of her entire vascular tree to rule out the presence of aneurysms. I explained to her that genetic testing her at this time for a VUS is not recommended as there is insufficient data to classify the familial variant as pathogenic and at the risk of being categorized as having a disease condition. She is agreeable and verbalized understanding of this.     Debbie [name], Ph.D, [facility] Clinical Molecular Geneticist

## 2019-05-31 ENCOUNTER — Encounter: Payer: Self-pay | Admitting: Internal Medicine

## 2019-08-01 ENCOUNTER — Other Ambulatory Visit: Payer: Self-pay

## 2019-08-02 ENCOUNTER — Ambulatory Visit: Payer: 59 | Admitting: Internal Medicine

## 2019-08-02 ENCOUNTER — Encounter: Payer: Self-pay | Admitting: Internal Medicine

## 2019-08-02 ENCOUNTER — Ambulatory Visit (INDEPENDENT_AMBULATORY_CARE_PROVIDER_SITE_OTHER): Payer: 59 | Admitting: Internal Medicine

## 2019-08-02 VITALS — BP 110/70 | HR 71 | Temp 97.4°F | Wt 255.5 lb

## 2019-08-02 DIAGNOSIS — R7302 Impaired glucose tolerance (oral): Secondary | ICD-10-CM

## 2019-08-02 DIAGNOSIS — E785 Hyperlipidemia, unspecified: Secondary | ICD-10-CM | POA: Diagnosis not present

## 2019-08-02 DIAGNOSIS — I10 Essential (primary) hypertension: Secondary | ICD-10-CM

## 2019-08-02 DIAGNOSIS — E039 Hypothyroidism, unspecified: Secondary | ICD-10-CM

## 2019-08-02 DIAGNOSIS — G4730 Sleep apnea, unspecified: Secondary | ICD-10-CM

## 2019-08-02 NOTE — Progress Notes (Signed)
Established Patient Office Visit     This visit occurred during the SARS-CoV-2 public health emergency.  Safety protocols were in place, including screening questions prior to the visit, additional usage of staff PPE, and extensive cleaning of exam room while observing appropriate contact time as indicated for disinfecting solutions.    CC/Reason for Visit: Follow-up blood pressure, issues with CPAP machine  HPI: Debbie [name] is a 49 y.o. female who is coming in today for the above mentioned reasons. Past Medical History is significant for: Newly diagnosed hypertension on lisinopril 10 and hydrochlorothiazide 25, hypothyroidism, depression, asthma, hyperlipidemia, morbid obesity and also recently diagnosed with impaired glucose tolerance.  Today she is here for blood pressure check.  She has also been having issues with her CPAP machine and needs a new prescription for 1 and a pulmonology referral.   Past Medical/Surgical History: Past Medical History:  Diagnosis Date  . Acute renal failure (ARF) ([place]) 10/01/2015  . Allergy   . Depression   . Elevated cholesterol   . Hyperlipidemia   . Hypertension   . Hypothyroidism   . Morbid obesity ([place]) 02/08/2019  . Seasonal allergies   . Sleep apnea    wears cpap   . Thyroid disease     Past Surgical History:  Procedure Laterality Date  . ADENOIDECTOMY    . DENTAL SURGERY     graft of mouth     Social History:  reports that she has never smoked. She has never used smokeless tobacco. She reports that she does not drink alcohol or use drugs.  Allergies: Allergies  Allergen Reactions  . Atorvastatin Hives    Family History:  Family History  Problem Relation Age of Onset  . Heart disease Father   . Hypertension Mother   . Hyperlipidemia Mother   . Breast cancer Maternal Grandmother   . Prostate cancer Maternal Grandfather   . Diabetes Maternal Grandfather   . CAD Maternal Grandfather   . Colon cancer Neg Hx   . Colon  polyps Neg Hx   . Esophageal cancer Neg Hx   . Stomach cancer Neg Hx   . Rectal cancer Neg Hx      Current Outpatient Medications:  .  Ascorbic Acid (VITAMIN C) 1000 MG tablet, Take 1,000 mg by mouth daily., Disp: , Rfl:  .  Azelastine-Fluticasone 137-50 MCG/ACT SUSP, as needed. , Disp: , Rfl:  .  cetirizine (ZYRTEC) 10 MG tablet, Take 10 mg by mouth daily., Disp: , Rfl:  .  Cholecalciferol (D3-1000 PO), Take by mouth daily. , Disp: , Rfl:  .  escitalopram (LEXAPRO) 20 MG tablet, Take 1 tablet (20 mg total) by mouth daily., Disp: 90 tablet, Rfl: 1 .  fenofibrate (TRICOR) 145 MG tablet, Take 1 tablet (145 mg total) by mouth daily., Disp: 90 tablet, Rfl: 1 .  hydrochlorothiazide (HYDRODIURIL) 25 MG tablet, Take 1 tablet (25 mg total) by mouth daily., Disp: 90 tablet, Rfl: 1 .  levothyroxine (SYNTHROID) 100 MCG tablet, Take 1 tablet (100 mcg total) by mouth every morning., Disp: 90 tablet, Rfl: 1 .  lisinopril (ZESTRIL) 10 MG tablet, Take 1 tablet (10 mg total) by mouth daily., Disp: 90 tablet, Rfl: 1 .  montelukast (SINGULAIR) 10 MG tablet, Take 1 tablet by mouth every evening., Disp: , Rfl: 5 .  OVER THE COUNTER MEDICATION, [place], Disp: , Rfl:  .  OVER THE COUNTER MEDICATION, Dandelion Root 525 mg daily, Disp: , Rfl:  .  PROAIR RESPICLICK  108 (90 Base) MCG/ACT AEPB, Inhale 2 puffs into the lungs every 4 (four) hours as needed., Disp: , Rfl: 0 .  rosuvastatin (CRESTOR) 20 MG tablet, Take 1 tablet (20 mg total) by mouth at bedtime., Disp: 90 tablet, Rfl: 1 .  vitamin B-12 (CYANOCOBALAMIN) 500 MCG tablet, Take 500 mcg by mouth daily., Disp: , Rfl:  .  zinc gluconate 50 MG tablet, Take 50 mg by mouth daily., Disp: , Rfl:   Review of Systems:  Constitutional: Denies fever, chills, diaphoresis, appetite change and fatigue.  HEENT: Denies photophobia, eye pain, redness, hearing loss, ear pain, congestion, sore throat, rhinorrhea, sneezing, mouth sores, trouble swallowing, neck pain, neck  stiffness and tinnitus.   Respiratory: Denies SOB, DOE, cough, chest tightness,  and wheezing.   Cardiovascular: Denies chest pain, palpitations and leg swelling.  Gastrointestinal: Denies nausea, vomiting, abdominal pain, diarrhea, constipation, blood in stool and abdominal distention.  Genitourinary: Denies dysuria, urgency, frequency, hematuria, flank pain and difficulty urinating.  Endocrine: Denies: hot or cold intolerance, sweats, changes in hair or nails, polyuria, polydipsia. Musculoskeletal: Denies myalgias, back pain, joint swelling, arthralgias and gait problem.  Skin: Denies pallor, rash and wound.  Neurological: Denies dizziness, seizures, syncope, weakness, light-headedness, numbness and headaches.  Hematological: Denies adenopathy. Easy bruising, personal or family bleeding history  Psychiatric/Behavioral: Denies suicidal ideation, mood changes, confusion, nervousness, sleep disturbance and agitation    Physical Exam: Vitals:   08/02/19 0756  BP: 110/70  Pulse: 71  Temp: (!) 97.4 F (36.3 C)  TempSrc: Temporal  SpO2: 97%  Weight: 255 lb 8 oz (115.9 kg)    Body mass index is 42.52 kg/m.   Constitutional: NAD, calm, comfortable, obese Eyes: PERRL, lids and conjunctivae normal ENMT: Mucous membranes are moist.  Respiratory: clear to auscultation bilaterally, no wheezing, no crackles. Normal respiratory effort. No accessory muscle use.  Cardiovascular: Regular rate and rhythm, no murmurs / rubs / gallops. No extremity edema.  Neurologic: Grossly intact and nonfocal Psychiatric: Normal judgment and insight. Alert and oriented x 3. Normal mood.    Impression and Plan:  Sleep apnea, unspecified type  -We will send in referral for pulmonary.  She does not have one in [place].  Essential hypertension -Well-controlled on current regimen.  IGT (impaired glucose tolerance) -New diagnosis, recheck A1c in 6 months.  Hyperlipidemia, unspecified hyperlipidemia  type -Last LDL was 100 in January.  Morbid obesity ([place]) -Discussed healthy lifestyle, including increased physical activity and better food choices to promote weight loss.  Hypothyroidism, unspecified type -Last TSH was 2.04 in January.   Patient Instructions  -Nice seeing you today!!  -See you back in 6 months.  -Will send in referral for lung doctor.     Lelon Frohlich, MD [facility]

## 2019-08-02 NOTE — Patient Instructions (Signed)
-  Nice seeing you today!!  -See you back in 6 months.  -Will send in referral for lung doctor.

## 2019-08-07 ENCOUNTER — Ambulatory Visit: Payer: 59 | Admitting: Internal Medicine

## 2019-09-21 ENCOUNTER — Other Ambulatory Visit: Payer: Self-pay | Admitting: Internal Medicine

## 2019-09-21 DIAGNOSIS — I10 Essential (primary) hypertension: Secondary | ICD-10-CM

## 2019-09-21 DIAGNOSIS — E785 Hyperlipidemia, unspecified: Secondary | ICD-10-CM

## 2019-09-30 ENCOUNTER — Other Ambulatory Visit: Payer: Self-pay | Admitting: Internal Medicine

## 2019-09-30 DIAGNOSIS — E039 Hypothyroidism, unspecified: Secondary | ICD-10-CM

## 2019-10-20 HISTORY — PX: ABLATION: SHX5711

## 2019-12-09 ENCOUNTER — Institutional Professional Consult (permissible substitution): Payer: 59 | Admitting: Internal Medicine

## 2019-12-27 ENCOUNTER — Ambulatory Visit (INDEPENDENT_AMBULATORY_CARE_PROVIDER_SITE_OTHER): Payer: 59 | Admitting: Pulmonary Disease

## 2019-12-27 ENCOUNTER — Other Ambulatory Visit: Payer: Self-pay

## 2019-12-27 ENCOUNTER — Encounter: Payer: Self-pay | Admitting: Pulmonary Disease

## 2019-12-27 VITALS — BP 124/82 | HR 96 | Temp 98.2°F | Ht 64.0 in | Wt 257.4 lb

## 2019-12-27 DIAGNOSIS — G4733 Obstructive sleep apnea (adult) (pediatric): Secondary | ICD-10-CM | POA: Diagnosis not present

## 2019-12-27 DIAGNOSIS — Z23 Encounter for immunization: Secondary | ICD-10-CM | POA: Diagnosis not present

## 2019-12-27 NOTE — Patient Instructions (Signed)
History of obstructive sleep apnea  We will schedule you for a home sleep study  Update your results then contact medical supply company to set you with an auto titrating machine  I will see you back in about 3 months  Call with significant concerns

## 2019-12-27 NOTE — Progress Notes (Signed)
Debbie [name]    [id number]    05/29/1970  Primary Care Physician:Hernandez Everardo Beals, MD  Referring Physician: Isaac Bliss, Rayford Halsted, MD [address] [place],  [place] [address]  Chief complaint:   History of obstructive sleep apnea with dated machine   HPI:  Machine not working properly gets Education officer, community from [place]  Diagnosed with severe obstructive sleep apnea currently on CPAP of 11  Usually goes to bed about 9 PM Takes about 30 minutes to fall asleep About 1 awakening Final wake up time about 7 AM  Weight is up about 50 pounds Has hypothyroidism-levels better controlled recently  Has seen some drop in weight with lifestyle changes  No family history of obstructive sleep apnea  Usually feels well restored whenever she wakes up with using CPAP   Outpatient Encounter Medications as of 12/27/2019  Medication Sig  . Ascorbic Acid (VITAMIN C) 1000 MG tablet Take 1,000 mg by mouth daily.  . Azelastine-Fluticasone 137-50 MCG/ACT SUSP as needed.   . cetirizine (ZYRTEC) 10 MG tablet Take 10 mg by mouth daily.  . Cholecalciferol (D3-1000 PO) Take by mouth daily.   Marland Kitchen escitalopram (LEXAPRO) 20 MG tablet Take 1 tablet (20 mg total) by mouth daily.  . fenofibrate (TRICOR) 145 MG tablet TAKE 1 TABLET BY MOUTH EVERY DAY  . hydrochlorothiazide (HYDRODIURIL) 25 MG tablet Take 1 tablet (25 mg total) by mouth daily.  Marland Kitchen levothyroxine (SYNTHROID) 100 MCG tablet TAKE 1 TABLET BY MOUTH EVERY DAY IN THE MORNING  . lisinopril (ZESTRIL) 10 MG tablet TAKE 1 TABLET BY MOUTH EVERY DAY  . montelukast (SINGULAIR) 10 MG tablet Take 1 tablet by mouth every evening.  Marland Kitchen OVER THE COUNTER MEDICATION Digestzen  . OVER THE COUNTER MEDICATION Dandelion Root 525 mg daily  . PROAIR RESPICLICK 532 (90 Base) MCG/ACT AEPB Inhale 2 puffs into the lungs every 4 (four) hours as needed.  . rosuvastatin (CRESTOR) 20 MG tablet Take 1 tablet (20 mg total) by mouth at bedtime.  .  vitamin B-12 (CYANOCOBALAMIN) 500 MCG tablet Take 500 mcg by mouth daily.  Marland Kitchen zinc gluconate 50 MG tablet Take 50 mg by mouth daily.   No facility-administered encounter medications on file as of 12/27/2019.    Allergies as of 12/27/2019 - Review Complete 08/02/2019  Allergen Reaction Noted  . Atorvastatin Hives 09/29/2015    Past Medical History:  Diagnosis Date  . Acute renal failure (ARF) ([place]) 10/01/2015  . Allergy   . Depression   . Elevated cholesterol   . Hyperlipidemia   . Hypertension   . Hypothyroidism   . Morbid obesity ([place]) 02/08/2019  . Seasonal allergies   . Sleep apnea    wears cpap   . Thyroid disease     Past Surgical History:  Procedure Laterality Date  . ADENOIDECTOMY    . DENTAL SURGERY     graft of mouth     Family History  Problem Relation Age of Onset  . Heart disease Father   . Hypertension Mother   . Hyperlipidemia Mother   . Breast cancer Maternal Grandmother   . Prostate cancer Maternal Grandfather   . Diabetes Maternal Grandfather   . CAD Maternal Grandfather   . Colon cancer Neg Hx   . Colon polyps Neg Hx   . Esophageal cancer Neg Hx   . Stomach cancer Neg Hx   . Rectal cancer Neg Hx     Social History  Socioeconomic History  . Marital status: Married    Spouse name: Not on file  . Number of children: Not on file  . Years of education: Not on file  . Highest education level: Not on file  Occupational History  . Not on file  Tobacco Use  . Smoking status: Never Smoker  . Smokeless tobacco: Never Used  Substance and Sexual Activity  . Alcohol use: No  . Drug use: No  . Sexual activity: Not on file  Other Topics Concern  . Not on file  Social History Narrative  . Not on file   Social Determinants of Health   Financial Resource Strain:   . Difficulty of Paying Living Expenses: Not on file  Food Insecurity:   . Worried About Charity fundraiser in the Last Year: Not on file  . Ran Out of Food in the Last Year: Not on  file  Transportation Needs:   . Lack of Transportation (Medical): Not on file  . Lack of Transportation (Non-Medical): Not on file  Physical Activity:   . Days of Exercise per Week: Not on file  . Minutes of Exercise per Session: Not on file  Stress:   . Feeling of Stress : Not on file  Social Connections:   . Frequency of Communication with Friends and Family: Not on file  . Frequency of Social Gatherings with Friends and Family: Not on file  . Attends Religious Services: Not on file  . Active Member of Clubs or Organizations: Not on file  . Attends Archivist Meetings: Not on file  . Marital Status: Not on file  Intimate Partner Violence:   . Fear of Current or Ex-Partner: Not on file  . Emotionally Abused: Not on file  . Physically Abused: Not on file  . Sexually Abused: Not on file    Review of Systems  Respiratory: Positive for apnea.   Psychiatric/Behavioral: Positive for sleep disturbance.  All other systems reviewed and are negative.   Vitals:   12/27/19 0958  BP: 124/82  Pulse: 96  Temp: 98.2 F (36.8 C)  SpO2: 97%     Physical Exam Constitutional:      Appearance: She is obese.  HENT:     Mouth/Throat:     Comments: Mallampati 3, crowded oropharynx Eyes:     General:        Left eye: No discharge.  Cardiovascular:     Rate and Rhythm: Normal rate and regular rhythm.     Heart sounds: No murmur heard.  No friction rub.  Pulmonary:     Effort: Pulmonary effort is normal. No respiratory distress.     Breath sounds: Normal breath sounds. No stridor. No wheezing or rhonchi.  Musculoskeletal:     Cervical back: No rigidity or tenderness.  Neurological:     Mental Status: She is alert.  Psychiatric:        Mood and Affect: Mood normal.    Results of the Epworth flowsheet 12/27/2019  Sitting and reading 1  Watching TV 1  Sitting, inactive in a public place (e.g. a theatre or a meeting) 0  As a passenger in a car for an hour without a break  3  Lying down to rest in the afternoon when circumstances permit 0  Sitting and talking to someone 0  Sitting quietly after a lunch without alcohol 0  In a car, while stopped for a few minutes in traffic 0  Total score 5  Assessment:  History of severe obstructive sleep apnea  Current machine is dated  She remains compliant  Plan/Recommendations: Schedule patient for home sleep study Risk of not treating sleep disordered breathing discussed  Follow-up in 3 months  Encouraged to continue working on weight loss efforts   Sherrilyn Rist MD [facility] 12/27/2019, 10:37 AM  CC: Isaac Bliss, [place]*

## 2020-01-08 ENCOUNTER — Other Ambulatory Visit: Payer: Self-pay | Admitting: Internal Medicine

## 2020-01-08 DIAGNOSIS — E785 Hyperlipidemia, unspecified: Secondary | ICD-10-CM

## 2020-01-17 ENCOUNTER — Other Ambulatory Visit: Payer: Self-pay | Admitting: Internal Medicine

## 2020-01-17 DIAGNOSIS — E785 Hyperlipidemia, unspecified: Secondary | ICD-10-CM

## 2020-01-17 DIAGNOSIS — I10 Essential (primary) hypertension: Secondary | ICD-10-CM

## 2020-01-21 ENCOUNTER — Other Ambulatory Visit: Payer: Self-pay

## 2020-01-21 ENCOUNTER — Ambulatory Visit: Payer: 59

## 2020-01-21 DIAGNOSIS — G4733 Obstructive sleep apnea (adult) (pediatric): Secondary | ICD-10-CM

## 2020-01-24 ENCOUNTER — Telehealth: Payer: Self-pay | Admitting: Pulmonary Disease

## 2020-01-24 DIAGNOSIS — G4733 Obstructive sleep apnea (adult) (pediatric): Secondary | ICD-10-CM

## 2020-01-24 NOTE — Telephone Encounter (Signed)
Call patient  Sleep study result  Date of study: 01/21/2020  Impression: Mild obstructive sleep apnea No significant oxygen desaturations  Recommendation: DME referral  Recommend CPAP therapy for mild obstructive sleep apnea  Auto titrating CPAP with pressure settings of 5-15 will be appropriate  Encourage weight loss measures  Follow-up in the office 4 to 6 weeks following initiation of treatment

## 2020-01-24 NOTE — Telephone Encounter (Signed)
Left message for patient to call back on Monday for results.

## 2020-01-28 NOTE — Telephone Encounter (Signed)
Pt notified of results and order sent to get new CPAP unit and change her settings.

## 2020-02-04 ENCOUNTER — Other Ambulatory Visit: Payer: Self-pay

## 2020-02-04 ENCOUNTER — Encounter: Payer: Self-pay | Admitting: Internal Medicine

## 2020-02-04 ENCOUNTER — Ambulatory Visit: Payer: 59 | Admitting: Internal Medicine

## 2020-02-04 VITALS — BP 124/80 | HR 65 | Temp 98.4°F | Wt 254.6 lb

## 2020-02-04 DIAGNOSIS — R7302 Impaired glucose tolerance (oral): Secondary | ICD-10-CM | POA: Diagnosis not present

## 2020-02-04 DIAGNOSIS — G4733 Obstructive sleep apnea (adult) (pediatric): Secondary | ICD-10-CM

## 2020-02-04 DIAGNOSIS — E785 Hyperlipidemia, unspecified: Secondary | ICD-10-CM

## 2020-02-04 DIAGNOSIS — I10 Essential (primary) hypertension: Secondary | ICD-10-CM

## 2020-02-04 DIAGNOSIS — E039 Hypothyroidism, unspecified: Secondary | ICD-10-CM

## 2020-02-04 LAB — POCT GLYCOSYLATED HEMOGLOBIN (HGB A1C): Hemoglobin A1C: 5.8 % — AB (ref 4.0–5.6)

## 2020-02-04 NOTE — Progress Notes (Signed)
Established Patient Office Visit     This visit occurred during the SARS-CoV-2 public health emergency.  Safety protocols were in place, including screening questions prior to the visit, additional usage of staff PPE, and extensive cleaning of exam room while observing appropriate contact time as indicated for disinfecting solutions.    CC/Reason for Visit: 29-month follow-up chronic medical conditions  HPI: Debbie [name] is a 49 y.o. female who is coming in today for the above mentioned reasons. Past Medical History is significant for: Hypertension, hypothyroidism, depression, asthma, hyperlipidemia, morbid obesity, impaired glucose tolerance and obstructive sleep apnea.  She had issues over the summer with dysfunctional uterine bleeding and finally ended up having an ablation.  Her blood counts were stable per report.  She has no acute complaints today.  Her immunizations are updated.  She is having a mammogram at the end of the month.   Past Medical/Surgical History: Past Medical History:  Diagnosis Date  . Acute renal failure (ARF) ([employer]) 10/01/2015  . Allergy   . Depression   . Elevated cholesterol   . Hyperlipidemia   . Hypertension   . Hypothyroidism   . Morbid obesity ([place]) 02/08/2019  . Seasonal allergies   . Sleep apnea    wears cpap   . Thyroid disease     Past Surgical History:  Procedure Laterality Date  . ADENOIDECTOMY    . DENTAL SURGERY     graft of mouth     Social History:  reports that she has never smoked. She has never used smokeless tobacco. She reports that she does not drink alcohol and does not use drugs.  Allergies: Allergies  Allergen Reactions  . Atorvastatin Hives    Family History:  Family History  Problem Relation Age of Onset  . Heart disease Father   . Hypertension Mother   . Hyperlipidemia Mother   . Breast cancer Maternal Grandmother   . Prostate cancer Maternal Grandfather   . Diabetes Maternal Grandfather   . CAD Maternal  Grandfather   . Colon cancer Neg Hx   . Colon polyps Neg Hx   . Esophageal cancer Neg Hx   . Stomach cancer Neg Hx   . Rectal cancer Neg Hx      Current Outpatient Medications:  .  Ascorbic Acid (VITAMIN C) 1000 MG tablet, Take 1,000 mg by mouth daily., Disp: , Rfl:  .  Azelastine-Fluticasone 137-50 MCG/ACT SUSP, as needed. , Disp: , Rfl:  .  cetirizine (ZYRTEC) 10 MG tablet, Take 10 mg by mouth daily., Disp: , Rfl:  .  Cholecalciferol (D3-1000 PO), Take by mouth daily. , Disp: , Rfl:  .  escitalopram (LEXAPRO) 20 MG tablet, Take 1 tablet (20 mg total) by mouth daily., Disp: 90 tablet, Rfl: 1 .  fenofibrate (TRICOR) 145 MG tablet, TAKE 1 TABLET BY MOUTH EVERY DAY, Disp: 90 tablet, Rfl: 1 .  hydrochlorothiazide (HYDRODIURIL) 25 MG tablet, Take 1 tablet (25 mg total) by mouth daily., Disp: 90 tablet, Rfl: 1 .  levothyroxine (SYNTHROID) 100 MCG tablet, TAKE 1 TABLET BY MOUTH EVERY DAY IN THE MORNING, Disp: 90 tablet, Rfl: 1 .  lisinopril (ZESTRIL) 10 MG tablet, TAKE 1 TABLET BY MOUTH EVERY DAY, Disp: 90 tablet, Rfl: 1 .  montelukast (SINGULAIR) 10 MG tablet, Take 1 tablet by mouth every evening., Disp: , Rfl: 5 .  OVER THE COUNTER MEDICATION, [place], Disp: , Rfl:  .  OVER THE COUNTER MEDICATION, Dandelion Root 525 mg daily, Disp: ,  Rfl:  .  PROAIR RESPICLICK 580 (90 Base) MCG/ACT AEPB, Inhale 2 puffs into the lungs every 4 (four) hours as needed., Disp: , Rfl: 0 .  rosuvastatin (CRESTOR) 20 MG tablet, TAKE 1 TABLET BY MOUTH EVERYDAY AT BEDTIME, Disp: 90 tablet, Rfl: 1 .  vitamin B-12 (CYANOCOBALAMIN) 500 MCG tablet, Take 500 mcg by mouth daily., Disp: , Rfl:  .  zinc gluconate 50 MG tablet, Take 50 mg by mouth daily., Disp: , Rfl:   Review of Systems:  Constitutional: Denies fever, chills, diaphoresis, appetite change and fatigue.  HEENT: Denies photophobia, eye pain, redness, hearing loss, ear pain, congestion, sore throat, rhinorrhea, sneezing, mouth sores, trouble swallowing, neck  pain, neck stiffness and tinnitus.   Respiratory: Denies SOB, DOE, cough, chest tightness,  and wheezing.   Cardiovascular: Denies chest pain, palpitations and leg swelling.  Gastrointestinal: Denies nausea, vomiting, abdominal pain, diarrhea, constipation, blood in stool and abdominal distention.  Genitourinary: Denies dysuria, urgency, frequency, hematuria, flank pain and difficulty urinating.  Endocrine: Denies: hot or cold intolerance, sweats, changes in hair or nails, polyuria, polydipsia. Musculoskeletal: Denies myalgias, back pain, joint swelling, arthralgias and gait problem.  Skin: Denies pallor, rash and wound.  Neurological: Denies dizziness, seizures, syncope, weakness, light-headedness, numbness and headaches.  Hematological: Denies adenopathy. Easy bruising, personal or family bleeding history  Psychiatric/Behavioral: Denies suicidal ideation, mood changes, confusion, nervousness, sleep disturbance and agitation    Physical Exam: Vitals:   02/04/20 0818  BP: 124/80  Pulse: 65  Temp: 98.4 F (36.9 C)  TempSrc: Oral  SpO2: 97%  Weight: 254 lb 9.6 oz (115.5 kg)    Body mass index is 43.7 kg/m.   Constitutional: NAD, calm, comfortable, obese Eyes: PERRL, lids and conjunctivae normal ENMT: Mucous membranes are moist.  Respiratory: clear to auscultation bilaterally, no wheezing, no crackles. Normal respiratory effort. No accessory muscle use.  Cardiovascular: Regular rate and rhythm, no murmurs / rubs / gallops. No extremity edema Neurologic: Grossly intact and nonfocal. Psychiatric: Normal judgment and insight. Alert and oriented x 3. Normal mood.    Impression and Plan:  IGT (impaired glucose tolerance) -A1c is 5.8 today.  Primary hypertension -Well-controlled on lisinopril and hydrochlorothiazide.  Hypothyroidism, unspecified type -Last TSH was within range in January, continue current levothyroxine dose.  Hyperlipidemia, unspecified hyperlipidemia  type -Last LDL was 100 in January, she remains on rosuvastatin.  Morbid obesity ([place]) -Discussed healthy lifestyle, including increased physical activity and better food choices to promote weight loss. -She has been able to lose around 4 pounds since we last spoke.  OSA (obstructive sleep apnea) -She has seen pulmonary, has had her CPAP updated.   Patient Instructions  -Nice seeing you today!!  -Schedule a 6 month follow up for your physical. Please come in fasting that day.     Lelon Frohlich, MD  Primary Care at [facility]

## 2020-02-04 NOTE — Patient Instructions (Signed)
-  Nice seeing you today!!  -Schedule a 6 month follow up for your physical. Please come in fasting that day.

## 2020-03-27 ENCOUNTER — Ambulatory Visit: Payer: 59 | Admitting: Cardiovascular Disease

## 2020-04-10 ENCOUNTER — Other Ambulatory Visit: Payer: Self-pay | Admitting: Internal Medicine

## 2020-04-10 DIAGNOSIS — I10 Essential (primary) hypertension: Secondary | ICD-10-CM

## 2020-04-24 ENCOUNTER — Other Ambulatory Visit: Payer: Self-pay | Admitting: Internal Medicine

## 2020-04-24 ENCOUNTER — Ambulatory Visit: Payer: 59 | Admitting: Cardiovascular Disease

## 2020-04-24 DIAGNOSIS — E039 Hypothyroidism, unspecified: Secondary | ICD-10-CM

## 2020-06-05 ENCOUNTER — Ambulatory Visit: Payer: 59 | Admitting: Cardiovascular Disease

## 2020-06-05 ENCOUNTER — Other Ambulatory Visit: Payer: Self-pay

## 2020-06-05 ENCOUNTER — Encounter: Payer: Self-pay | Admitting: Cardiovascular Disease

## 2020-06-05 VITALS — BP 122/84 | HR 61 | Ht 64.0 in | Wt 258.6 lb

## 2020-06-05 DIAGNOSIS — I10 Essential (primary) hypertension: Secondary | ICD-10-CM | POA: Diagnosis not present

## 2020-06-05 DIAGNOSIS — E785 Hyperlipidemia, unspecified: Secondary | ICD-10-CM | POA: Diagnosis not present

## 2020-06-05 DIAGNOSIS — Z8279 Family history of other congenital malformations, deformations and chromosomal abnormalities: Secondary | ICD-10-CM | POA: Diagnosis not present

## 2020-06-05 LAB — BASIC METABOLIC PANEL
BUN/Creatinine Ratio: 12 (ref 9–23)
BUN: 10 mg/dL (ref 6–24)
CO2: 25 mmol/L (ref 20–29)
Calcium: 10.8 mg/dL — ABNORMAL HIGH (ref 8.7–10.2)
Chloride: 98 mmol/L (ref 96–106)
Creatinine, Ser: 0.81 mg/dL (ref 0.57–1.00)
Glucose: 84 mg/dL (ref 65–99)
Potassium: 4 mmol/L (ref 3.5–5.2)
Sodium: 139 mmol/L (ref 134–144)
eGFR: 89 mL/min/{1.73_m2} (ref >59)

## 2020-06-05 NOTE — Patient Instructions (Addendum)
Medication Instructions:  Your physician recommends that you continue on your current medications as directed. Please refer to the Current Medication list given to you today.  *If you need a refill on your cardiac medications before your next appointment, please call your pharmacy*   Lab Work: Today: BMET If you have labs (blood work) drawn today and your tests are completely normal, you will receive your results only by: Marland Kitchen MyChart Message (if you have MyChart) OR . A paper copy in the mail If you have any lab test that is abnormal or we need to change your treatment, we will call you to review the results.   Testing/Procedures: CT Angiography (CTA), is a special type of CT scan that uses a computer to produce multi-dimensional views of major blood vessels throughout the body. In CT angiography, a contrast material is injected through an IV to help visualize the blood vessels   Follow-Up: At [facility], you and your health needs are our priority.  As part of our continuing mission to provide you with exceptional heart care, we have created designated Provider Care Teams.  These Care Teams include your primary Cardiologist (physician) and Advanced Practice Providers (APPs -  Physician Assistants and Nurse Practitioners) who all work together to provide you with the care you need, when you need it.   Your next appointment:    As needed  The format for your next appointment:   In Person  Provider:   You may see Mertie Moores, MD or one of the following Advanced Practice Providers on your designated Care Team:    Richardson Dopp, PA-C  [place], [place]

## 2020-06-05 NOTE — Progress Notes (Signed)
Cardiology Office Note:    Date:  06/05/2020   ID:  Debbie [name], DOB 1970/09/29, MRN [id number]  PCP:  Isaac Bliss, Rayford Halsted, MD  Cardiologist:  Mertie Moores, MD  Electrophysiologist:  None   Referring MD: Isaac Bliss, [place]*   Chief Complaint  Patient presents with  . Obesity  . Hypertension    Previous notes:    Debbie [name] is a 50 y.o. female with a hx of  HTN, HLD.  Father had a bicuspid AV.   Father Had AVR and ascending aortic aneurism repaired.  Very strong family hx of aortic aneurisms on his fathers side [facility] did SMAD3 gene defect. This is an autosomal dominant inherited defect.  We are asked to see her today by Dr. Jerilee Hoh for further evaluation and management of her hypertension as well as her family history of bicuspid aortic valve and aortic aneurysms.  She is here for screening for bicuspid AV and aortic aneurism scanning   BP has been elevated.  Recently started on HCTZ  B[place] on weekends    She has had an echo in the past in [place].  No report of bicuspid AV   No CP or dyspnea.   Not as much regular exercise  Not current working  [place] on occasion.   Avoid high salt foods.   Is going for lipid and other labs tomorrow   June 05, 2020:  Debbie [name] is seen today for follow up of her obesity, HTN and family hx of bicuspid AV.Echo in Jan. 2021 shows a 3 leaflet AV with minimal dilatation of her ascending aorta .  She has seen Dr. Broadus John who recommended a CTA of her aorta    Getting some exercise Wt is 258 lbs ( + 4 lbs from last year )  We discussed a wt loss program called [employer] . Ubaldo Glassing, RN ( a weight loss coach for [employer]) will give her a call soon .    Past Medical History:  Diagnosis Date  . Acute renal failure (ARF) ([place]) 10/01/2015  . Allergy   . Depression   . Elevated cholesterol   . Hyperlipidemia   . Hypertension   . Hypothyroidism   . Morbid obesity ([place]) 02/08/2019  . Seasonal allergies   . Sleep apnea     wears cpap   . Thyroid disease     Past Surgical History:  Procedure Laterality Date  . ADENOIDECTOMY    . DENTAL SURGERY     graft of mouth     Current Medications: Current Meds  Medication Sig  . Ascorbic Acid (VITAMIN C) 1000 MG tablet Take 1,000 mg by mouth daily.  . Azelastine-Fluticasone 137-50 MCG/ACT SUSP as needed.   . cetirizine (ZYRTEC) 10 MG tablet Take 10 mg by mouth daily.  . Cholecalciferol (D3-1000 PO) Take by mouth daily.   Marland Kitchen escitalopram (LEXAPRO) 20 MG tablet Take 1 tablet (20 mg total) by mouth daily.  . fenofibrate (TRICOR) 145 MG tablet TAKE 1 TABLET BY MOUTH EVERY DAY  . hydrochlorothiazide (HYDRODIURIL) 25 MG tablet TAKE 1 TABLET BY MOUTH EVERY DAY  . levothyroxine (SYNTHROID) 100 MCG tablet TAKE 1 TABLET BY MOUTH EVERY DAY IN THE MORNING  . montelukast (SINGULAIR) 10 MG tablet Take 1 tablet by mouth every evening.  Marland Kitchen OVER THE COUNTER MEDICATION Dandelion Root 525 mg daily  . PROAIR RESPICLICK 270 (90 Base) MCG/ACT AEPB Inhale 2 puffs into the lungs every 4 (four) hours as needed.  . rosuvastatin (CRESTOR) 20  MG tablet TAKE 1 TABLET BY MOUTH EVERYDAY AT BEDTIME  . vitamin B-12 (CYANOCOBALAMIN) 500 MCG tablet Take 500 mcg by mouth daily.  Marland Kitchen zinc gluconate 50 MG tablet Take 50 mg by mouth daily.     Allergies:   Atorvastatin   Social History   Socioeconomic History  . Marital status: Married    Spouse name: Not on file  . Number of children: Not on file  . Years of education: Not on file  . Highest education level: Not on file  Occupational History  . Not on file  Tobacco Use  . Smoking status: Never Smoker  . Smokeless tobacco: Never Used  Substance and Sexual Activity  . Alcohol use: No  . Drug use: No  . Sexual activity: Not on file  Other Topics Concern  . Not on file  Social History Narrative  . Not on file   Social Determinants of Health   Financial Resource Strain: Not on file  Food Insecurity: Not on file  Transportation  Needs: Not on file  Physical Activity: Not on file  Stress: Not on file  Social Connections: Not on file     Family History: The patient's family history includes Breast cancer in her maternal grandmother; CAD in her maternal grandfather; Diabetes in her maternal grandfather; Heart disease in her father; Hyperlipidemia in her mother; Hypertension in her mother; Prostate cancer in her maternal grandfather. There is no history of Colon cancer, Colon polyps, Esophageal cancer, Stomach cancer, or Rectal cancer.  ROS:   Please see the history of present illness.     All other systems reviewed and are negative.  EKGs/Labs/Other Studies Reviewed:    The following studies were reviewed today:     Recent Labs: No results found for requested labs within last 8760 hours.  Recent Lipid Panel    Component Value Date/Time   CHOL 183 04/03/2019 0730   TRIG 177.0 (H) 04/03/2019 0730   HDL 47.10 04/03/2019 0730   CHOLHDL 4 04/03/2019 0730   VLDL 35.4 04/03/2019 0730   LDLCALC 100 (H) 04/03/2019 0730    Physical Exam:     Physical Exam: Blood pressure 122/84, pulse 61, height 5\' 4"  (1.626 m), weight 258 lb 9.6 oz (117.3 kg), SpO2 97 %.  GEN:  Well nourished, well developed in no acute distress HEENT: Normal NECK: No JVD; No carotid bruits LYMPHATICS: No lymphadenopathy CARDIAC: RRR , no murmurs, rubs, gallops RESPIRATORY:  Clear to auscultation without rales, wheezing or rhonchi  ABDOMEN: Soft, non-tender, non-distended MUSCULOSKELETAL:  No edema; No deformity  SKIN: Warm and dry NEUROLOGIC:  Alert and oriented x 3   EKG:   June 05, 2020: Normal sinus rhythm at 61.  No ST or T wave changes.   ASSESSMENT:    1. Family history of bicuspid aortic valve    PLAN:      1.  Hypertension:   BP is well controlled.    2.  Family history of bicuspid aortic valve with aortic aneurysm: She has a strong family history of bicuspid aortic valve with aortic aneurysms.  Her father was  found to have a SMAD3 gene defect.     She has been seen by Dr. Broadus John our geneticist.  There is no evidence that Debbie [name] has any aortic valve disease.  Dr. Broadus John  of has recommended that we do a CT angiogram of her entire aorta to look for aneurysms.  We will arrange for that.  3.  Obesity :  Will have her talke to [facility] coach for [employer]    Medication Adjustments/Labs and Tests Ordered: Current medicines are reviewed at length with the patient today.  Concerns regarding medicines are outlined above.  Orders Placed This Encounter  Procedures  . CT ANGIO CHEST AORTA W/CM & OR WO/CM  . Basic metabolic panel  . EKG 12-Lead   No orders of the defined types were placed in this encounter.   Patient Instructions  Medication Instructions:  Your physician recommends that you continue on your current medications as directed. Please refer to the Current Medication list given to you today.  *If you need a refill on your cardiac medications before your next appointment, please call your pharmacy*   Lab Work: Today: BMET If you have labs (blood work) drawn today and your tests are completely normal, you will receive your results only by: Marland Kitchen MyChart Message (if you have MyChart) OR . A paper copy in the mail If you have any lab test that is abnormal or we need to change your treatment, we will call you to review the results.   Testing/Procedures: CT Angiography (CTA), is a special type of CT scan that uses a computer to produce multi-dimensional views of major blood vessels throughout the body. In CT angiography, a contrast material is injected through an IV to help visualize the blood vessels   Follow-Up: At [facility], you and your health needs are our priority.  As part of our continuing mission to provide you with exceptional heart care, we have created designated Provider Care Teams.  These Care Teams include your primary Cardiologist (physician) and Advanced Practice  Providers (APPs -  Physician Assistants and Nurse Practitioners) who all work together to provide you with the care you need, when you need it.   Your next appointment:    As needed  The format for your next appointment:   In Person  Provider:   You may see Mertie Moores, MD or one of the following Advanced Practice Providers on your designated Care Team:    Richardson Dopp, PA-C  [place], [place]     Signed, Mertie Moores, MD  06/05/2020 12:12 PM    [place]

## 2020-06-13 ENCOUNTER — Other Ambulatory Visit: Payer: Self-pay | Admitting: Internal Medicine

## 2020-06-13 DIAGNOSIS — F3342 Major depressive disorder, recurrent, in full remission: Secondary | ICD-10-CM

## 2020-06-13 DIAGNOSIS — E785 Hyperlipidemia, unspecified: Secondary | ICD-10-CM

## 2020-06-17 ENCOUNTER — Ambulatory Visit (INDEPENDENT_AMBULATORY_CARE_PROVIDER_SITE_OTHER)
Admission: RE | Admit: 2020-06-17 | Discharge: 2020-06-17 | Disposition: A | Discharge status: Home / Self Care | Payer: 59 | Source: Ambulatory Visit | Attending: Cardiovascular Disease | Admitting: Cardiovascular Disease

## 2020-06-17 ENCOUNTER — Other Ambulatory Visit: Payer: Self-pay

## 2020-06-17 DIAGNOSIS — Z8279 Family history of other congenital malformations, deformations and chromosomal abnormalities: Secondary | ICD-10-CM

## 2020-06-17 MED ORDER — IOHEXOL 350 MG/ML SOLN
100.0000 mL | Freq: Once | INTRAVENOUS | Status: AC | PRN
  Administered 2020-06-17: 100 mL via INTRAVENOUS

## 2020-08-04 ENCOUNTER — Ambulatory Visit (INDEPENDENT_AMBULATORY_CARE_PROVIDER_SITE_OTHER): Payer: 59 | Admitting: Internal Medicine

## 2020-08-04 ENCOUNTER — Encounter: Payer: Self-pay | Admitting: Internal Medicine

## 2020-08-04 ENCOUNTER — Other Ambulatory Visit: Payer: Self-pay

## 2020-08-04 VITALS — BP 110/80 | HR 67 | Temp 98.1°F | Ht 65.0 in | Wt 250.1 lb

## 2020-08-04 DIAGNOSIS — E785 Hyperlipidemia, unspecified: Secondary | ICD-10-CM | POA: Diagnosis not present

## 2020-08-04 DIAGNOSIS — F3342 Major depressive disorder, recurrent, in full remission: Secondary | ICD-10-CM

## 2020-08-04 DIAGNOSIS — E039 Hypothyroidism, unspecified: Secondary | ICD-10-CM

## 2020-08-04 DIAGNOSIS — I10 Essential (primary) hypertension: Secondary | ICD-10-CM

## 2020-08-04 DIAGNOSIS — R7302 Impaired glucose tolerance (oral): Secondary | ICD-10-CM | POA: Diagnosis not present

## 2020-08-04 DIAGNOSIS — Z1211 Encounter for screening for malignant neoplasm of colon: Secondary | ICD-10-CM

## 2020-08-04 DIAGNOSIS — Z0000 Encounter for general adult medical examination without abnormal findings: Secondary | ICD-10-CM | POA: Diagnosis not present

## 2020-08-04 LAB — COMPREHENSIVE METABOLIC PANEL
ALT: 18 U/L (ref 0–35)
AST: 20 U/L (ref 0–37)
Albumin: 4.8 g/dL (ref 3.5–5.2)
Alkaline Phosphatase: 35 U/L — ABNORMAL LOW (ref 39–117)
BUN: 18 mg/dL (ref 6–23)
CO2: 28 mEq/L (ref 19–32)
Calcium: 10.2 mg/dL (ref 8.4–10.5)
Chloride: 102 mEq/L (ref 96–112)
Creatinine, Ser: 0.8 mg/dL (ref 0.40–1.20)
GFR: 86.23 mL/min (ref >60.00)
Glucose, Bld: 107 mg/dL — ABNORMAL HIGH (ref 70–99)
Potassium: 4.1 mEq/L (ref 3.5–5.1)
Sodium: 140 mEq/L (ref 135–145)
Total Bilirubin: 0.5 mg/dL (ref 0.2–1.2)
Total Protein: 7.4 g/dL (ref 6.0–8.3)

## 2020-08-04 LAB — CBC WITH DIFFERENTIAL/PLATELET
Basophils Absolute: 0 10*3/uL (ref 0.0–0.1)
Basophils Relative: 0.5 % (ref 0.0–3.0)
Eosinophils Absolute: 0.3 10*3/uL (ref 0.0–0.7)
Eosinophils Relative: 3.7 % (ref 0.0–5.0)
HCT: 36.3 % (ref 36.0–46.0)
Hemoglobin: 13 g/dL (ref 12.0–15.0)
Lymphocytes Relative: 32.6 % (ref 12.0–46.0)
Lymphs Abs: 2.3 10*3/uL (ref 0.7–4.0)
MCHC: 35.9 g/dL (ref 30.0–36.0)
MCV: 91.1 fl (ref 78.0–100.0)
Monocytes Absolute: 0.5 10*3/uL (ref 0.1–1.0)
Monocytes Relative: 7.3 % (ref 3.0–12.0)
Neutro Abs: 3.9 10*3/uL (ref 1.4–7.7)
Neutrophils Relative %: 55.9 % (ref 43.0–77.0)
Platelets: 292 10*3/uL (ref 150.0–400.0)
RBC: 3.99 Mil/uL (ref 3.87–5.11)
RDW: 13.3 % (ref 11.5–15.5)
WBC: 6.9 10*3/uL (ref 4.0–10.5)

## 2020-08-04 LAB — HEMOGLOBIN A1C: Hgb A1c MFr Bld: 6.1 % (ref 4.6–6.5)

## 2020-08-04 LAB — LIPID PANEL
Cholesterol: 174 mg/dL (ref 0–200)
HDL: 44.6 mg/dL (ref >39.00)
LDL Cholesterol: 100 mg/dL — ABNORMAL HIGH (ref 0–99)
NonHDL: 129.86
Total CHOL/HDL Ratio: 4
Triglycerides: 148 mg/dL (ref 0.0–149.0)
VLDL: 29.6 mg/dL (ref 0.0–40.0)

## 2020-08-04 LAB — TSH: TSH: 1.19 u[IU]/mL (ref 0.35–4.50)

## 2020-08-04 LAB — VITAMIN B12: Vitamin B-12: 922 pg/mL — ABNORMAL HIGH (ref 211–911)

## 2020-08-04 LAB — VITAMIN D 25 HYDROXY (VIT D DEFICIENCY, FRACTURES): VITD: 45.72 ng/mL (ref 30.00–100.00)

## 2020-08-04 NOTE — Patient Instructions (Signed)
-Nice seeing you today!!  -Lab work today; will notify you once results are available.  -Schedule follow up in 6 months or sooner as needed.   Preventive Care 31-50 Years Old, Female Preventive care refers to lifestyle choices and visits with your health care provider that can promote health and wellness. This includes:  A yearly physical exam. This is also called an annual wellness visit.  Regular dental and eye exams.  Immunizations.  Screening for certain conditions.  Healthy lifestyle choices, such as: ? Eating a healthy diet. ? Getting regular exercise. ? Not using drugs or products that contain nicotine and tobacco. ? Limiting alcohol use. What can I expect for my preventive care visit? Physical exam Your health care provider will check your:  Height and weight. These may be used to calculate your BMI (body mass index). BMI is a measurement that tells if you are at a healthy weight.  Heart rate and blood pressure.  Body temperature.  Skin for abnormal spots. Counseling Your health care provider may ask you questions about your:  Past medical problems.  Family's medical history.  Alcohol, tobacco, and drug use.  Emotional well-being.  Home life and relationship well-being.  Sexual activity.  Diet, exercise, and sleep habits.  Work and work Statistician.  Access to firearms.  Method of birth control.  Menstrual cycle.  Pregnancy history. What immunizations do I need? Vaccines are usually given at various ages, according to a schedule. Your health care provider will recommend vaccines for you based on your age, medical history, and lifestyle or other factors, such as travel or where you work.   What tests do I need? Blood tests  Lipid and cholesterol levels. These may be checked every 5 years, or more often if you are over 4 years old.  Hepatitis C test.  Hepatitis B test. Screening  Lung cancer screening. You may have this screening every  year starting at age 91 if you have a 30-pack-year history of smoking and currently smoke or have quit within the past 15 years.  Colorectal cancer screening. ? All adults should have this screening starting at age 8 and continuing until age 65. ? Your health care provider may recommend screening at age 102 if you are at increased risk. ? You will have tests every 1-10 years, depending on your results and the type of screening test.  Diabetes screening. ? This is done by checking your blood sugar (glucose) after you have not eaten for a while (fasting). ? You may have this done every 1-3 years.  Mammogram. ? This may be done every 1-2 years. ? Talk with your health care provider about when you should start having regular mammograms. This may depend on whether you have a family history of breast cancer.  BRCA-related cancer screening. This may be done if you have a family history of breast, ovarian, tubal, or peritoneal cancers.  Pelvic exam and Pap test. ? This may be done every 3 years starting at age 45. ? Starting at age 42, this may be done every 5 years if you have a Pap test in combination with an HPV test. Other tests  STD (sexually transmitted disease) testing, if you are at risk.  Bone density scan. This is done to screen for osteoporosis. You may have this scan if you are at high risk for osteoporosis. Talk with your health care provider about your test results, treatment options, and if necessary, the need for more tests. Follow these instructions at  home: Eating and drinking  Eat a diet that includes fresh fruits and vegetables, whole grains, lean protein, and low-fat dairy products.  Take vitamin and mineral supplements as recommended by your health care provider.  Do not drink alcohol if: ? Your health care provider tells you not to drink. ? You are pregnant, may be pregnant, or are planning to become pregnant.  If you drink alcohol: ? Limit how much you have to  0-1 drink a day. ? Be aware of how much alcohol is in your drink. In the [place] one drink equals one 12 oz bottle of beer (355 mL), one 5 oz glass of wine (148 mL), or one 1 oz glass of hard liquor (44 mL).   Lifestyle  Take daily care of your teeth and gums. Brush your teeth every morning and night with fluoride toothpaste. Floss one time each day.  Stay active. Exercise for at least 30 minutes 5 or more days each week.  Do not use any products that contain nicotine or tobacco, such as cigarettes, e-cigarettes, and chewing tobacco. If you need help quitting, ask your health care provider.  Do not use drugs.  If you are sexually active, practice safe sex. Use a condom or other form of protection to prevent STIs (sexually transmitted infections).  If you do not wish to become pregnant, use a form of birth control. If you plan to become pregnant, see your health care provider for a prepregnancy visit.  If told by your health care provider, take low-dose aspirin daily starting at age 68.  Find healthy ways to cope with stress, such as: ? Meditation, yoga, or listening to music. ? Journaling. ? Talking to a trusted person. ? Spending time with friends and family. Safety  Always wear your seat belt while driving or riding in a vehicle.  Do not drive: ? If you have been drinking alcohol. Do not ride with someone who has been drinking. ? When you are tired or distracted. ? While texting.  Wear a helmet and other protective equipment during sports activities.  If you have firearms in your house, make sure you follow all gun safety procedures. What's next?  Visit your health care provider once a year for an annual wellness visit.  Ask your health care provider how often you should have your eyes and teeth checked.  Stay up to date on all vaccines. This information is not intended to replace advice given to you by your health care provider. Make sure you discuss any questions you have  with your health care provider. Document Revised: 12/10/2019 Document Reviewed: 11/16/2017 Elsevier Patient Education  2021 [employer].

## 2020-08-04 NOTE — Progress Notes (Signed)
Established Patient Office Visit     This visit occurred during the SARS-CoV-2 public health emergency.  Safety protocols were in place, including screening questions prior to the visit, additional usage of staff PPE, and extensive cleaning of exam room while observing appropriate contact time as indicated for disinfecting solutions.    CC/Reason for Visit: Annual preventive exam  HPI: Debbie [name] is a 50 y.o. female who is coming in today for the above mentioned reasons. Past Medical History is significant for: Hypertension, hypothyroidism, depression, asthma, hyperlipidemia, morbid obesity, impaired glucose tolerance and obstructive sleep apnea.  She had a uterine ablation in the summer 2021 due to dysfunctional uterine bleeding.  No issues since.  She follows with GYN routinely.  She has routine eye and dental care.  She has started to exercise, has lost 9 pounds since March.  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  She is overdue for colonoscopy and mammograms.  She was told by her insurance company that they would not cover her colonoscopy until she turns 64, this is next month.  She is doing well and has no complaints.   Past Medical/Surgical History: Past Medical History:  Diagnosis Date  . Acute renal failure (ARF) ([place]) 10/01/2015  . Allergy   . Depression   . Elevated cholesterol   . Hyperlipidemia   . Hypertension   . Hypothyroidism   . Morbid obesity ([place]) 02/08/2019  . Seasonal allergies   . Sleep apnea    wears cpap   . Thyroid disease     Past Surgical History:  Procedure Laterality Date  . ADENOIDECTOMY    . DENTAL SURGERY     graft of mouth     Social History:  reports that she has never smoked. She has never used smokeless tobacco. She reports that she does not drink alcohol and does not use drugs.  Allergies: Allergies  Allergen Reactions  . Atorvastatin Hives  .  Levocetirizine Other (See Comments)    Family History:  Family History  Problem Relation Age of Onset  . Heart disease Father   . Hypertension Mother   . Hyperlipidemia Mother   . Breast cancer Maternal Grandmother   . Prostate cancer Maternal Grandfather   . Diabetes Maternal Grandfather   . CAD Maternal Grandfather   . Colon cancer Neg Hx   . Colon polyps Neg Hx   . Esophageal cancer Neg Hx   . Stomach cancer Neg Hx   . Rectal cancer Neg Hx      Current Outpatient Medications:  .  Ascorbic Acid (VITAMIN C) 1000 MG tablet, Take 1,000 mg by mouth daily., Disp: , Rfl:  .  cetirizine (ZYRTEC) 10 MG tablet, Take 10 mg by mouth daily., Disp: , Rfl:  .  Cholecalciferol (D3-1000 PO), Take by mouth daily. , Disp: , Rfl:  .  escitalopram (LEXAPRO) 20 MG tablet, TAKE 1 TABLET BY MOUTH EVERY DAY, Disp: 90 tablet, Rfl: 0 .  fenofibrate (TRICOR) 145 MG tablet, TAKE 1 TABLET BY MOUTH EVERY DAY, Disp: 90 tablet, Rfl: 1 .  hydrochlorothiazide (HYDRODIURIL) 25 MG tablet, TAKE 1 TABLET BY MOUTH EVERY DAY, Disp: 90 tablet, Rfl: 1 .  levothyroxine (SYNTHROID) 100 MCG tablet, TAKE 1 TABLET BY MOUTH EVERY DAY IN THE MORNING, Disp: 90 tablet, Rfl: 1 .  montelukast (SINGULAIR) 10 MG tablet, Take 1 tablet by mouth every evening., Disp: ,  Rfl: 5 .  PROAIR RESPICLICK 353 (90 Base) MCG/ACT AEPB, Inhale 2 puffs into the lungs every 4 (four) hours as needed., Disp: , Rfl: 0 .  rosuvastatin (CRESTOR) 20 MG tablet, TAKE 1 TABLET BY MOUTH EVERYDAY AT BEDTIME, Disp: 90 tablet, Rfl: 0 .  vitamin B-12 (CYANOCOBALAMIN) 500 MCG tablet, Take 500 mcg by mouth daily., Disp: , Rfl:  .  zinc gluconate 50 MG tablet, Take 50 mg by mouth daily., Disp: , Rfl:   Review of Systems:  Constitutional: Denies fever, chills, diaphoresis, appetite change and fatigue.  HEENT: Denies photophobia, eye pain, redness, hearing loss, ear pain, congestion, sore throat, rhinorrhea, sneezing, mouth sores, trouble swallowing, neck pain, neck  stiffness and tinnitus.   Respiratory: Denies SOB, DOE, cough, chest tightness,  and wheezing.   Cardiovascular: Denies chest pain, palpitations and leg swelling.  Gastrointestinal: Denies nausea, vomiting, abdominal pain, diarrhea, constipation, blood in stool and abdominal distention.  Genitourinary: Denies dysuria, urgency, frequency, hematuria, flank pain and difficulty urinating.  Endocrine: Denies: hot or cold intolerance, sweats, changes in hair or nails, polyuria, polydipsia. Musculoskeletal: Denies myalgias, back pain, joint swelling, arthralgias and gait problem.  Skin: Denies pallor, rash and wound.  Neurological: Denies dizziness, seizures, syncope, weakness, light-headedness, numbness and headaches.  Hematological: Denies adenopathy. Easy bruising, personal or family bleeding history  Psychiatric/Behavioral: Denies suicidal ideation, mood changes, confusion, nervousness, sleep disturbance and agitation    Physical Exam: Vitals:   08/04/20 0803  BP: 110/80  Pulse: 67  Temp: 98.1 F (36.7 C)  TempSrc: Oral  SpO2: 97%  Weight: 250 lb 1.6 oz (113.4 kg)  Height: _0  (1.651 m)    Body mass index is 41.62 kg/m.   Constitutional: NAD, calm, comfortable Eyes: PERRL, lids and conjunctivae normal, wears corrective lenses ENMT: Mucous membranes are moist. Posterior pharynx clear of any exudate or lesions. Normal dentition. Tympanic membrane is pearly white, no erythema or bulging. Neck: normal, supple, no masses, no thyromegaly Respiratory: clear to auscultation bilaterally, no wheezing, no crackles. Normal respiratory effort. No accessory muscle use.  Cardiovascular: Regular rate and rhythm, no murmurs / rubs / gallops. No extremity edema. 2+ pedal pulses. No carotid bruits.  Abdomen: no tenderness, no masses palpated. No hepatosplenomegaly. Bowel sounds positive.  Musculoskeletal: no clubbing / cyanosis. No joint deformity upper and lower extremities. Good ROM, no  contractures. Normal muscle tone.  Skin: no rashes, lesions, ulcers. No induration Neurologic: CN 2-12 grossly intact. Sensation intact, DTR normal. Strength 5/5 in all 4.  Psychiatric: Normal judgment and insight. Alert and oriented x 3. Normal mood.    Impression and Plan:  Encounter for preventive health examination -She has routine eye and dental care. -All immunizations are up-to-date and age-appropriate. -Screening labs today. -Healthy lifestyle discussed in detail. -Commence colon cancer screening age 53 per insurance as they refused to come for colonoscopy beforehand. -She had a mammogram in October 2021 that per patient was negative. -She had a Pap smear with her GYN in October 2021.  IGT (impaired glucose tolerance)  - Plan: Hemoglobin A1c  Primary hypertension  - Plan: CBC with Differential/Platelet, Comprehensive metabolic panel -Blood pressures well controlled.  Hypothyroidism, unspecified type - Plan: TSH  Hyperlipidemia, unspecified hyperlipidemia type  - Plan: Lipid panel -She is on rosuvastatin daily, last LDL was 100.  Morbid obesity ([place]) -Discussed healthy lifestyle, including increased physical activity and better food choices to promote weight loss. -Congratulated on weight loss thus far.  Recurrent major depressive disorder, in full remission ([place]) -  Stable, on Lexapro.   Patient Instructions   -Nice seeing you today!!  -Lab work today; will notify you once results are available.  -Schedule follow up in 6 months or sooner as needed.   Preventive Care 39-3 Years Old, Female Preventive care refers to lifestyle choices and visits with your health care provider that can promote health and wellness. This includes:  A yearly physical exam. This is also called an annual wellness visit.  Regular dental and eye exams.  Immunizations.  Screening for certain conditions.  Healthy lifestyle choices, such as: ? Eating a healthy diet. ? Getting  regular exercise. ? Not using drugs or products that contain nicotine and tobacco. ? Limiting alcohol use. What can I expect for my preventive care visit? Physical exam Your health care provider will check your:  Height and weight. These may be used to calculate your BMI (body mass index). BMI is a measurement that tells if you are at a healthy weight.  Heart rate and blood pressure.  Body temperature.  Skin for abnormal spots. Counseling Your health care provider may ask you questions about your:  Past medical problems.  Family's medical history.  Alcohol, tobacco, and drug use.  Emotional well-being.  Home life and relationship well-being.  Sexual activity.  Diet, exercise, and sleep habits.  Work and work Statistician.  Access to firearms.  Method of birth control.  Menstrual cycle.  Pregnancy history. What immunizations do I need? Vaccines are usually given at various ages, according to a schedule. Your health care provider will recommend vaccines for you based on your age, medical history, and lifestyle or other factors, such as travel or where you work.   What tests do I need? Blood tests  Lipid and cholesterol levels. These may be checked every 5 years, or more often if you are over 42 years old.  Hepatitis C test.  Hepatitis B test. Screening  Lung cancer screening. You may have this screening every year starting at age 55 if you have a 30-pack-year history of smoking and currently smoke or have quit within the past 15 years.  Colorectal cancer screening. ? All adults should have this screening starting at age 63 and continuing until age 51. ? Your health care provider may recommend screening at age 44 if you are at increased risk. ? You will have tests every 1-10 years, depending on your results and the type of screening test.  Diabetes screening. ? This is done by checking your blood sugar (glucose) after you have not eaten for a while  (fasting). ? You may have this done every 1-3 years.  Mammogram. ? This may be done every 1-2 years. ? Talk with your health care provider about when you should start having regular mammograms. This may depend on whether you have a family history of breast cancer.  BRCA-related cancer screening. This may be done if you have a family history of breast, ovarian, tubal, or peritoneal cancers.  Pelvic exam and Pap test. ? This may be done every 3 years starting at age 51. ? Starting at age 62, this may be done every 5 years if you have a Pap test in combination with an HPV test. Other tests  STD (sexually transmitted disease) testing, if you are at risk.  Bone density scan. This is done to screen for osteoporosis. You may have this scan if you are at high risk for osteoporosis. Talk with your health care provider about your test results, treatment options, and if  necessary, the need for more tests. Follow these instructions at home: Eating and drinking  Eat a diet that includes fresh fruits and vegetables, whole grains, lean protein, and low-fat dairy products.  Take vitamin and mineral supplements as recommended by your health care provider.  Do not drink alcohol if: ? Your health care provider tells you not to drink. ? You are pregnant, may be pregnant, or are planning to become pregnant.  If you drink alcohol: ? Limit how much you have to 0-1 drink a day. ? Be aware of how much alcohol is in your drink. In the [place] one drink equals one 12 oz bottle of beer (355 mL), one 5 oz glass of wine (148 mL), or one 1 oz glass of hard liquor (44 mL).   Lifestyle  Take daily care of your teeth and gums. Brush your teeth every morning and night with fluoride toothpaste. Floss one time each day.  Stay active. Exercise for at least 30 minutes 5 or more days each week.  Do not use any products that contain nicotine or tobacco, such as cigarettes, e-cigarettes, and chewing tobacco. If you need  help quitting, ask your health care provider.  Do not use drugs.  If you are sexually active, practice safe sex. Use a condom or other form of protection to prevent STIs (sexually transmitted infections).  If you do not wish to become pregnant, use a form of birth control. If you plan to become pregnant, see your health care provider for a prepregnancy visit.  If told by your health care provider, take low-dose aspirin daily starting at age 55.  Find healthy ways to cope with stress, such as: ? Meditation, yoga, or listening to music. ? Journaling. ? Talking to a trusted person. ? Spending time with friends and family. Safety  Always wear your seat belt while driving or riding in a vehicle.  Do not drive: ? If you have been drinking alcohol. Do not ride with someone who has been drinking. ? When you are tired or distracted. ? While texting.  Wear a helmet and other protective equipment during sports activities.  If you have firearms in your house, make sure you follow all gun safety procedures. What's next?  Visit your health care provider once a year for an annual wellness visit.  Ask your health care provider how often you should have your eyes and teeth checked.  Stay up to date on all vaccines. This information is not intended to replace advice given to you by your health care provider. Make sure you discuss any questions you have with your health care provider. Document Revised: 12/10/2019 Document Reviewed: 11/16/2017 Elsevier Patient Education  2021 Villalba, MD [facility]

## 2020-08-05 ENCOUNTER — Encounter: Payer: Self-pay | Admitting: Internal Medicine

## 2020-09-07 ENCOUNTER — Other Ambulatory Visit: Payer: Self-pay | Admitting: Internal Medicine

## 2020-09-07 DIAGNOSIS — F3342 Major depressive disorder, recurrent, in full remission: Secondary | ICD-10-CM

## 2020-09-11 ENCOUNTER — Other Ambulatory Visit: Payer: Self-pay | Admitting: Internal Medicine

## 2020-09-11 DIAGNOSIS — E785 Hyperlipidemia, unspecified: Secondary | ICD-10-CM

## 2020-09-19 ENCOUNTER — Other Ambulatory Visit: Payer: Self-pay | Admitting: Internal Medicine

## 2020-09-19 DIAGNOSIS — E785 Hyperlipidemia, unspecified: Secondary | ICD-10-CM

## 2020-09-24 ENCOUNTER — Encounter: Payer: Self-pay | Admitting: Internal Medicine

## 2020-09-24 DIAGNOSIS — Z1211 Encounter for screening for malignant neoplasm of colon: Secondary | ICD-10-CM

## 2020-09-30 ENCOUNTER — Other Ambulatory Visit: Payer: Self-pay | Admitting: Internal Medicine

## 2020-09-30 DIAGNOSIS — I10 Essential (primary) hypertension: Secondary | ICD-10-CM

## 2020-09-30 DIAGNOSIS — E039 Hypothyroidism, unspecified: Secondary | ICD-10-CM

## 2020-09-30 DIAGNOSIS — E785 Hyperlipidemia, unspecified: Secondary | ICD-10-CM

## 2020-10-14 ENCOUNTER — Encounter: Payer: Self-pay | Admitting: Physician Assistant

## 2020-11-12 ENCOUNTER — Other Ambulatory Visit: Payer: Self-pay | Admitting: Physician Assistant

## 2020-11-12 ENCOUNTER — Encounter: Payer: Self-pay | Admitting: Physician Assistant

## 2020-11-12 ENCOUNTER — Ambulatory Visit: Payer: 59 | Admitting: Physician Assistant

## 2020-11-12 VITALS — BP 122/78 | HR 68 | Ht 64.0 in | Wt 248.0 lb

## 2020-11-12 DIAGNOSIS — R195 Other fecal abnormalities: Secondary | ICD-10-CM | POA: Diagnosis not present

## 2020-11-12 DIAGNOSIS — Z1211 Encounter for screening for malignant neoplasm of colon: Secondary | ICD-10-CM | POA: Diagnosis not present

## 2020-11-12 MED ORDER — NA SULFATE-K SULFATE-MG SULF 17.5-3.13-1.6 GM/177ML PO SOLN: 1.0000 | Freq: Once | ORAL | 0 refills | Status: AC

## 2020-11-12 NOTE — Patient Instructions (Signed)
You have been scheduled for a colonoscopy. Please follow written instructions given to you at your visit today.  Please pick up your prep supplies at the pharmacy within the next 1-3 days. If you use inhalers (even only as needed), please bring them with you on the day of your procedure.  If you are age 50 or older, your body mass index should be between 23-30. Your Body mass index is 42.57 kg/m. If this is out of the aforementioned range listed, please consider follow up with your Primary Care Provider.  If you are age 98 or younger, your body mass index should be between 19-25. Your Body mass index is 42.57 kg/m. If this is out of the aformentioned range listed, please consider follow up with your Primary Care Provider.   __________________________________________________________  The [facility] providers would like to encourage you to use [facility] to communicate with providers for non-urgent requests or questions.  Due to long hold times on the telephone, sending your provider a message by [facility] may be a faster and more efficient way to get a response.  Please allow 48 business hours for a response.  Please remember that this is for non-urgent requests.

## 2020-11-12 NOTE — Progress Notes (Signed)
Chief Complaint: Screening for colorectal cancer and change in appearance of stool  HPI:    Mrs. Debbie [name] is a 50 year old female with a past medical history as listed below including obesity, who was referred to me by Isaac Bliss, [place]* for a complaint of change in appearance of stool and a need for screening colonoscopy    08/04/2020 CBC and CMP normal.    Today, the patient tells me that back in March she started dieting, really just limiting caloric intake and eating healthier and around that time she noticed a change in her bowel movements.  Tells me that she has never really turned around and looked at the stool but when she wipes she has been noticing some mucus which is kind of a clear/yellow color.  She is not sure if the stool itself looks any different.  Denies any radiation from diarrhea to constipation and has maintained fairly regular bowel movements.  Denies abdominal pain.    Denies fever, chills, weight loss, blood in her stool, heartburn or reflux.  Past Medical History:  Diagnosis Date   Acute renal failure (ARF) ([place]) 10/01/2015   Allergy    Depression    Elevated cholesterol    Hyperlipidemia    Hypertension    Hypothyroidism    Morbid obesity ([place]) 02/08/2019   Seasonal allergies    Sleep apnea    wears cpap    Thyroid disease     Past Surgical History:  Procedure Laterality Date   ADENOIDECTOMY     DENTAL SURGERY     graft of mouth     Current Outpatient Medications  Medication Sig Dispense Refill   Ascorbic Acid (VITAMIN C) 1000 MG tablet Take 1,000 mg by mouth daily.     cetirizine (ZYRTEC) 10 MG tablet Take 10 mg by mouth daily.     Cholecalciferol (D3-1000 PO) Take by mouth daily.      escitalopram (LEXAPRO) 20 MG tablet TAKE 1 TABLET BY MOUTH EVERY DAY 90 tablet 1   fenofibrate (TRICOR) 145 MG tablet TAKE 1 TABLET BY MOUTH EVERY DAY 90 tablet 1   hydrochlorothiazide (HYDRODIURIL) 25 MG tablet TAKE 1 TABLET BY MOUTH EVERY DAY 90 tablet 1    levothyroxine (SYNTHROID) 100 MCG tablet TAKE 1 TABLET BY MOUTH EVERY DAY IN THE MORNING 90 tablet 1   montelukast (SINGULAIR) 10 MG tablet Take 1 tablet by mouth every evening.  5   PROAIR RESPICLICK 123XX123 (90 Base) MCG/ACT AEPB Inhale 2 puffs into the lungs every 4 (four) hours as needed.  0   rosuvastatin (CRESTOR) 20 MG tablet TAKE 1 TABLET BY MOUTH EVERYDAY AT BEDTIME 90 tablet 0   vitamin B-12 (CYANOCOBALAMIN) 500 MCG tablet Take 500 mcg by mouth daily.     zinc gluconate 50 MG tablet Take 50 mg by mouth daily.     No current facility-administered medications for this visit.    Allergies as of 11/12/2020 - Review Complete 08/04/2020  Allergen Reaction Noted   Atorvastatin Hives 09/29/2015   Levocetirizine Other (See Comments) 02/19/2020    Family History  Problem Relation Age of Onset   Heart disease Father    Hypertension Mother    Hyperlipidemia Mother    Breast cancer Maternal Grandmother    Prostate cancer Maternal Grandfather    Diabetes Maternal Grandfather    CAD Maternal Grandfather    Colon cancer Neg Hx    Colon polyps Neg Hx    Esophageal cancer Neg Hx    Stomach  cancer Neg Hx    Rectal cancer Neg Hx     Social History   Socioeconomic History   Marital status: Married    Spouse name: Not on file   Number of children: Not on file   Years of education: Not on file   Highest education level: Not on file  Occupational History   Not on file  Tobacco Use   Smoking status: Never   Smokeless tobacco: Never  Substance and Sexual Activity   Alcohol use: No   Drug use: No   Sexual activity: Not on file  Other Topics Concern   Not on file  Social History Narrative   Not on file   Social Determinants of Health   Financial Resource Strain: Not on file  Food Insecurity: Not on file  Transportation Needs: Not on file  Physical Activity: Not on file  Stress: Not on file  Social Connections: Not on file  Intimate Partner Violence: Not on file    Review of  Systems:    Constitutional: No weight loss, fever or chills Skin: No rash  Cardiovascular: No chest pain Respiratory: No SOB Gastrointestinal: See HPI and otherwise negative Genitourinary: No dysuria  Neurological: No headache, dizziness or syncope Musculoskeletal: No new muscle or joint pain Hematologic: No bleeding  Psychiatric: No history of depression or anxiety   Physical Exam:  Vital signs: BP 122/78   Pulse 68   Ht '5\' 4"'$  (1.626 m)   Wt 248 lb (112.5 kg)   SpO2 98%   BMI 42.57 kg/m    Constitutional:   Pleasant Caucasian female appears to be in NAD, Well developed, Well nourished, alert and cooperative Head:  Normocephalic and atraumatic. Eyes:   PEERL, EOMI. No icterus. Conjunctiva pink. Ears:  Normal auditory acuity. Neck:  Supple Throat: Oral cavity and pharynx without inflammation, swelling or lesion.  Respiratory: Respirations even and unlabored. Lungs clear to auscultation bilaterally.   No wheezes, crackles, or rhonchi.  Cardiovascular: Normal S1, S2. No MRG. Regular rate and rhythm. No peripheral edema, cyanosis or pallor.  Gastrointestinal:  Soft, nondistended, nontender. No rebound or guarding. Normal bowel sounds. No appreciable masses or hepatomegaly. Rectal:  Not performed.  Msk:  Symmetrical without gross deformities. Without edema, no deformity or joint abnormality.  Neurologic:  Alert and  oriented x4;  grossly normal neurologically.  Skin:   Dry and intact without significant lesions or rashes. Psychiatric: Demonstrates good judgement and reason without abnormal affect or behaviors.  RELEVANT LABS AND IMAGING: CBC    Component Value Date/Time   WBC 6.9 08/04/2020 0824   RBC 3.99 08/04/2020 0824   HGB 13.0 08/04/2020 0824   HCT 36.3 08/04/2020 0824   PLT 292.0 08/04/2020 0824   MCV 91.1 08/04/2020 0824   MCH 32.3 10/03/2015 0358   MCHC 35.9 08/04/2020 0824   RDW 13.3 08/04/2020 0824   LYMPHSABS 2.3 08/04/2020 0824   MONOABS 0.5 08/04/2020 0824    EOSABS 0.3 08/04/2020 0824   BASOSABS 0.0 08/04/2020 0824    CMP     Component Value Date/Time   NA 140 08/04/2020 0824   NA 139 06/05/2020 1217   K 4.1 08/04/2020 0824   CL 102 08/04/2020 0824   CO2 28 08/04/2020 0824   GLUCOSE 107 (H) 08/04/2020 0824   BUN 18 08/04/2020 0824   BUN 10 06/05/2020 1217   CREATININE 0.80 08/04/2020 0824   CALCIUM 10.2 08/04/2020 0824   PROT 7.4 08/04/2020 0824   ALBUMIN 4.8 08/04/2020 0824  AST 20 08/04/2020 0824   ALT 18 08/04/2020 0824   ALKPHOS 35 (L) 08/04/2020 0824   BILITOT 0.5 08/04/2020 0824   GFRNONAA >60 10/03/2015 0358   GFRAA >60 10/03/2015 0358    Assessment: 1.  Screening for colorectal cancer: Patient is 60 and never had screening for colorectal cancer 2.  Change in stool appearance: Patient has not seen actual stool but has noticed that when she wipes on the toilet paper there may be some clear/yellow mucus over the past 5 months since dieting; consider normal variant+/- IBS versus other  Plan: 1.  Scheduled patient for screening colonoscopy in the [place].  She requests a sooner date and was scheduled with Dr. Tarri Glenn.  Did provide the patient with a detailed list of risks for the procedure and she agrees to proceed. 2.  Discussed with patient that the next time she has a bowel movement she needs to look at the actual stool.  Some mucus when wiping can be a variant of normal, especially if she has changed her diet drastically, reassured her that labs in May which were done during this were normal.  Colonoscopy will also help reassure her. 3.  Patient to follow in clinic per recommendations from Dr. Tarri Glenn after time of procedure.  Ellouise Newer, PA-C [facility] 11/12/2020, 2:29 PM  Cc: Isaac Bliss, [place]*

## 2020-11-13 NOTE — Progress Notes (Signed)
Reviewed and agree with management plans. ? ?Jamilet Ambroise L. Fatemah Pourciau, MD, MPH  ?

## 2020-11-18 ENCOUNTER — Other Ambulatory Visit: Payer: Self-pay

## 2020-11-18 ENCOUNTER — Ambulatory Visit (AMBULATORY_SURGERY_CENTER): Payer: 59 | Admitting: Gastroenterology

## 2020-11-18 ENCOUNTER — Encounter: Payer: Self-pay | Admitting: Gastroenterology

## 2020-11-18 VITALS — BP 147/86 | HR 60 | Temp 97.5°F | Resp 14 | Ht 64.0 in | Wt 248.0 lb

## 2020-11-18 DIAGNOSIS — K635 Polyp of colon: Secondary | ICD-10-CM | POA: Diagnosis not present

## 2020-11-18 DIAGNOSIS — D122 Benign neoplasm of ascending colon: Secondary | ICD-10-CM

## 2020-11-18 DIAGNOSIS — Z1211 Encounter for screening for malignant neoplasm of colon: Secondary | ICD-10-CM | POA: Diagnosis present

## 2020-11-18 MED ORDER — SODIUM CHLORIDE 0.9 % IV SOLN: 500.0000 mL | Freq: Once | INTRAVENOUS | Status: DC

## 2020-11-18 NOTE — Op Note (Signed)
[facility] Patient Name: Debbie [name] Procedure Date: 11/18/2020 11:40 AM MRN: [id number] Endoscopist: Thornton Park MD, MD Age: 50 Referring MD:  Date of Birth: 12/28/1970 Gender: Female Account #: [id number] Procedure:                Colonoscopy Indications:              Screening for colorectal malignant neoplasm, This                            is the patient's first colonoscopy                           No known family history of colon cancer or polyps Medicines:                Monitored Anesthesia Care Procedure:                Pre-Anesthesia Assessment:                           - Prior to the procedure, a History and Physical                            was performed, and patient medications and                            allergies were reviewed. The patient's tolerance of                            previous anesthesia was also reviewed. The risks                            and benefits of the procedure and the sedation                            options and risks were discussed with the patient.                            All questions were answered, and informed consent                            was obtained. Prior Anticoagulants: The patient has                            taken no previous anticoagulant or antiplatelet                            agents. ASA Grade Assessment: III - A patient with                            severe systemic disease. After reviewing the risks                            and benefits, the patient was deemed in  satisfactory condition to undergo the procedure.                           After obtaining informed consent, the colonoscope                            was passed under direct vision. Throughout the                            procedure, the patient's blood pressure, pulse, and                            oxygen saturations were monitored continuously. The                            CF [id number]  was introduced through the anus                            and advanced to the 3 cm into the ileum. A second                            forward view of the right colon was performed. The                            colonoscopy was performed without difficulty. The                            patient tolerated the procedure well. The quality                            of the bowel preparation was good. The terminal                            ileum, ileocecal valve, appendiceal orifice, and                            rectum were photographed. Scope In: 11:48:31 AM Scope Out: 12:01:06 PM Scope Withdrawal Time: 0 hours 8 minutes 54 seconds  Total Procedure Duration: 0 hours 12 minutes 35 seconds  Findings:                 The perianal and digital rectal examinations were                            normal.                           Multiple small and large-mouthed diverticula were                            found in the sigmoid colon.                           A 3 mm polyp was found in the proximal ascending  colon. The polyp was sessile. The polyp was removed                            with a cold snare. Resection and retrieval were                            complete. Estimated blood loss was minimal.                           The exam was otherwise without abnormality on                            direct and retroflexion views. Complications:            No immediate complications. Estimated blood loss:                            Minimal. Estimated Blood Loss:     Estimated blood loss was minimal. Impression:               - Diverticulosis in the sigmoid colon.                           - One 3 mm polyp in the proximal ascending colon,                            removed with a cold snare. Resected and retrieved.                           - The examination was otherwise normal on direct                            and retroflexion views. Recommendation:           -  Patient has a contact number available for                            emergencies. The signs and symptoms of potential                            delayed complications were discussed with the                            patient. Return to normal activities tomorrow.                            Written discharge instructions were provided to the                            patient.                           - Resume previous diet. High fiber diet recommended.                           - Continue present  medications.                           - Await pathology results.                           - Repeat colonoscopy date to be determined after                            pending pathology results are reviewed for                            surveillance.                           - Emerging evidence supports eating a diet of                            fruits, vegetables, grains, calcium, and yogurt                            while reducing red meat and alcohol may reduce the                            risk of colon cancer.                           - Thank you for allowing me to be involved in your                            colon cancer prevention. Thornton Park MD, MD 11/18/2020 12:04:47 PM This report has been signed electronically.

## 2020-11-18 NOTE — Progress Notes (Signed)
Referring Provider: Isaac Bliss, [facility]* Primary Care Physician:  Isaac Bliss, Rayford Halsted, MD  Reason for Procedure:  Colon cancer screening   IMPRESSION:  Need for colon cancer screening  PLAN: Colonoscopy in the [facility] today   HPI: Debbie [name] is a 50 y.o. female presents for screening colonoscopy.  No prior colonoscopy or colon cancer screening.  No baseline GI symptoms.   No known family history of colon cancer or polyps. No family history of uterine/endometrial cancer, pancreatic cancer or gastric/stomach cancer.   Past Medical History:  Diagnosis Date   Acute renal failure (ARF) ([place]) 10/01/2015   Allergy    Depression    Elevated cholesterol    Hyperlipidemia    Hypertension    Hypothyroidism    Morbid obesity ([place]) 02/08/2019   Seasonal allergies    Sleep apnea    wears cpap    Thyroid disease     Past Surgical History:  Procedure Laterality Date   ABLATION  10/2019   ADENOIDECTOMY     COLONOSCOPY     DENTAL SURGERY     graft of mouth     Current Outpatient Medications  Medication Sig Dispense Refill   Ascorbic Acid (VITAMIN C) 1000 MG tablet Take 1,000 mg by mouth daily.     Azelastine HCl 137 MCG/SPRAY SOLN Place into both nostrils.     cetirizine (ZYRTEC) 10 MG tablet Take 10 mg by mouth daily.     Cholecalciferol (D3-1000 PO) Take by mouth daily.      escitalopram (LEXAPRO) 20 MG tablet TAKE 1 TABLET BY MOUTH EVERY DAY 90 tablet 1   fenofibrate (TRICOR) 145 MG tablet TAKE 1 TABLET BY MOUTH EVERY DAY 90 tablet 1   hydrochlorothiazide (HYDRODIURIL) 25 MG tablet TAKE 1 TABLET BY MOUTH EVERY DAY 90 tablet 1   levothyroxine (SYNTHROID) 100 MCG tablet TAKE 1 TABLET BY MOUTH EVERY DAY IN THE MORNING 90 tablet 1   montelukast (SINGULAIR) 10 MG tablet Take 1 tablet by mouth every evening.  5   rosuvastatin (CRESTOR) 20 MG tablet TAKE 1 TABLET BY MOUTH EVERYDAY AT BEDTIME 90 tablet 0   vitamin B-12 (CYANOCOBALAMIN) 500 MCG tablet Take 500 mcg by  mouth daily.     zinc gluconate 50 MG tablet Take 50 mg by mouth daily.     PROAIR RESPICLICK 123XX123 (90 Base) MCG/ACT AEPB Inhale 2 puffs into the lungs every 4 (four) hours as needed. (Patient not taking: Reported on 11/18/2020)  0   Current Facility-Administered Medications  Medication Dose Route Frequency Provider Last Rate Last Admin   0.9 %  sodium chloride infusion  500 mL Intravenous Once Thornton Park, MD        Allergies as of 11/18/2020 - Review Complete 11/18/2020  Allergen Reaction Noted   Atorvastatin Hives 09/29/2015   Levocetirizine Other (See Comments) 02/19/2020    Family History  Problem Relation Age of Onset   Heart disease Father    Hypertension Mother    Hyperlipidemia Mother    Breast cancer Maternal Grandmother    Prostate cancer Maternal Grandfather    Diabetes Maternal Grandfather    CAD Maternal Grandfather    Colon cancer Neg Hx    Colon polyps Neg Hx    Esophageal cancer Neg Hx    Stomach cancer Neg Hx    Rectal cancer Neg Hx      Physical Exam: General:   Alert,  well-nourished, pleasant and cooperative in NAD Head:  Normocephalic and atraumatic. Eyes:  Sclera clear, no icterus.   Conjunctiva pink. Mouth:  No deformity or lesions.   Neck:  Supple; no masses or thyromegaly. Lungs:  Clear throughout to auscultation.   No wheezes. Heart:  Regular rate and rhythm; no murmurs. Abdomen:  Soft, non-tender, nondistended, normal bowel sounds, no rebound or guarding.  Msk:  Symmetrical. No boney deformities LAD: No inguinal or umbilical LAD Extremities:  No clubbing or edema. Neurologic:  Alert and  oriented x4;  grossly nonfocal Skin:  No obvious rash or bruise. Psych:  Alert and cooperative. Normal mood and affect.      [name] L. Tarri Glenn, MD, MPH 11/18/2020, 11:37 AM

## 2020-11-18 NOTE — Progress Notes (Signed)
VS- 

## 2020-11-18 NOTE — Patient Instructions (Signed)
Handout on polyps and diverticulosis given. Resume previous diet (high fiber diet recommended). Await pathology results. Repeat colonoscopy to be determined after pending pathology results are reviewed.     YOU HAD AN ENDOSCOPIC PROCEDURE TODAY AT [facility]:   Refer to the procedure report that was given to you for any specific questions about what was found during the examination.  If the procedure report does not answer your questions, please call your gastroenterologist to clarify.  If you requested that your care partner not be given the details of your procedure findings, then the procedure report has been included in a sealed envelope for you to review at your convenience later.  YOU SHOULD EXPECT: Some feelings of bloating in the abdomen. Passage of more gas than usual.  Walking can help get rid of the air that was put into your GI tract during the procedure and reduce the bloating. If you had a lower endoscopy (such as a colonoscopy or flexible sigmoidoscopy) you may notice spotting of blood in your stool or on the toilet paper. If you underwent a bowel prep for your procedure, you may not have a normal bowel movement for a few days.  Please Note:  You might notice some irritation and congestion in your nose or some drainage.  This is from the oxygen used during your procedure.  There is no need for concern and it should clear up in a day or so.  SYMPTOMS TO REPORT IMMEDIATELY:  Following lower endoscopy (colonoscopy or flexible sigmoidoscopy):  Excessive amounts of blood in the stool  Significant tenderness or worsening of abdominal pains  Swelling of the abdomen that is new, acute  Fever of 100F or higher   For urgent or emergent issues, a gastroenterologist can be reached at any hour by calling [phone]. Do not use MyChart messaging for urgent concerns.    DIET:  We do recommend a small meal at first, but then you may proceed to your regular diet.  Drink  plenty of fluids but you should avoid alcoholic beverages for 24 hours.  ACTIVITY:  You should plan to take it easy for the rest of today and you should NOT DRIVE or use heavy machinery until tomorrow (because of the sedation medicines used during the test).    FOLLOW UP: Our staff will call the number listed on your records 48-72 hours following your procedure to check on you and address any questions or concerns that you may have regarding the information given to you following your procedure. If we do not reach you, we will leave a message.  We will attempt to reach you two times.  During this call, we will ask if you have developed any symptoms of COVID 19. If you develop any symptoms (ie: fever, flu-like symptoms, shortness of breath, cough etc.) before then, please call [phone].  If you test positive for Covid 19 in the 2 weeks post procedure, please call and report this information to [place].    If any biopsies were taken you will be contacted by phone or by letter within the next 1-3 weeks.  Please call us at [phone] if you have not heard about the biopsies in 3 weeks.    SIGNATURES/CONFIDENTIALITY: You and/or your care partner have signed paperwork which will be entered into your electronic medical record.  These signatures attest to the fact that that the information above on your After Visit Summary has been reviewed and is understood.  Full responsibility of  the confidentiality of this discharge information lies with you and/or your care-partner. 

## 2020-11-18 NOTE — Progress Notes (Signed)
PT taken to PACU. Monitors in place. VSS. Report given to RN. 

## 2020-11-18 NOTE — Progress Notes (Signed)
Called to room to assist during endoscopic procedure.  Patient ID and intended procedure confirmed with present staff. Received instructions for my participation in the procedure from the performing physician.  

## 2020-11-20 ENCOUNTER — Telehealth: Payer: Self-pay | Admitting: *Deleted

## 2020-11-20 ENCOUNTER — Encounter: Payer: Self-pay | Admitting: Gastroenterology

## 2020-11-20 NOTE — Telephone Encounter (Signed)
  Follow up Call-  Call back number 11/18/2020  Post procedure Call Back phone  # [phone]  Permission to leave phone message Yes  Some recent data might be hidden     Patient questions:  Do you have a fever, pain , or abdominal swelling? No. Pain Score  0 *  Have you tolerated food without any problems? Yes.    Have you been able to return to your normal activities? Yes.    Do you have any questions about your discharge instructions: Diet   No. Medications  No. Follow up visit  No.  Do you have questions or concerns about your Care? No.  Actions: * If pain score is 4 or above: No action needed, pain <4.  Have you developed a fever since your procedure? no  2.   Have you had an respiratory symptoms (SOB or cough) since your procedure? no  3.   Have you tested positive for COVID 19 since your procedure no  4.   Have you had any family members/close contacts diagnosed with the COVID 19 since your procedure?  no   If yes to any of these questions please route to Joylene John, RN and Joella Prince, RN

## 2020-12-08 ENCOUNTER — Telehealth: Payer: 59 | Admitting: Registered Nurse

## 2020-12-08 ENCOUNTER — Other Ambulatory Visit: Payer: Self-pay

## 2020-12-08 ENCOUNTER — Encounter: Payer: Self-pay | Admitting: Registered Nurse

## 2020-12-08 DIAGNOSIS — J329 Chronic sinusitis, unspecified: Secondary | ICD-10-CM

## 2020-12-08 DIAGNOSIS — B9689 Other specified bacterial agents as the cause of diseases classified elsewhere: Secondary | ICD-10-CM | POA: Diagnosis not present

## 2020-12-08 MED ORDER — DOXYCYCLINE HYCLATE 100 MG PO TABS: 100.0000 mg | ORAL_TABLET | Freq: Two times a day (BID) | ORAL | 0 refills | Status: DC

## 2020-12-08 MED ORDER — AZELASTINE-FLUTICASONE 137-50 MCG/ACT NA SUSP: 1.0000 | Freq: Two times a day (BID) | NASAL | 0 refills | Status: DC

## 2020-12-08 MED ORDER — PREDNISONE 10 MG PO TABS: 30.0000 mg | ORAL_TABLET | Freq: Every day | ORAL | 0 refills | Status: DC

## 2020-12-08 NOTE — Patient Instructions (Signed)
° ° ° °  If you have lab work done today you will be contacted with your lab results within the next 2 weeks.  If you have not heard from us then please contact us. The fastest way to get your results is to register for My Chart. ° ° °IF you received an x-ray today, you will receive an invoice from [facility]. Please contact [facility] at [phone] with questions or concerns regarding your invoice.  ° °IF you received labwork today, you will receive an invoice from [facility]. Please contact [facility] at [phone] with questions or concerns regarding your invoice.  ° °Our billing staff will not be able to assist you with questions regarding bills from these companies. ° °You will be contacted with the lab results as soon as they are available. The fastest way to get your results is to activate your My Chart account. Instructions are located on the last page of this paperwork. If you have not heard from us regarding the results in 2 weeks, please contact this office. °  ° ° ° °

## 2020-12-08 NOTE — Progress Notes (Signed)
Telemedicine Encounter- SOAP NOTE Established Patient  This telephone encounter was conducted with the patient's (or proxy's) verbal consent via audio telecommunications: yes  Patient was instructed to have this encounter in a suitably private space; and to only have persons present to whom they give permission to participate. In addition, patient identity was confirmed by use of name plus two identifiers (DOB and address).  I discussed the limitations, risks, security and privacy concerns of performing an evaluation and management service by telephone and the availability of in person appointments. I also discussed with the patient that there may be a patient responsible charge related to this service. The patient expressed understanding and agreed to proceed.  I spent a total of 14 minutes talking with the patient or their proxy.  Patient at home Provider in office  Participants: Kathrin Ruddy, NP and Lorin Mercy  Chief Complaint  Patient presents with   Sinus Problem    Patient states she has an sinus infection since Friday . Sinus drainage , and cough with some phlegm coming up that's yellow. She has took some otc cold medication that has help break some cold up but still feels bad.    Subjective   Debbie [name] is a 50 y.o. established patient. Telephone visit today for sinus problem  HPI Onset Th-Fri last week Usually has this 1-2 times each year. Nearly always develops into bacterial infection. Sinus pressure, nasal congestion, productive cough with yellow mucus Some mild shob, wheezing - thinks this is mostly PND. Has used albuterol once through currently  Denies: lightheadedness, dizziness, nvd, chest pain, palpitations, constitutional symptoms  Patient Active Problem List   Diagnosis Date Noted   IGT (impaired glucose tolerance) 04/03/2019   Hypertension 02/20/2019   Morbid obesity ([place]) 02/08/2019   Hypothyroidism    Hyperlipidemia    Depression    Seasonal  allergies    Acute renal failure (ARF) ([place]) 10/01/2015    Past Medical History:  Diagnosis Date   Acute renal failure (ARF) ([place]) 10/01/2015   Allergy    Depression    Elevated cholesterol    Hyperlipidemia    Hypertension    Hypothyroidism    Morbid obesity ([place]) 02/08/2019   Seasonal allergies    Sleep apnea    wears cpap    Thyroid disease     Current Outpatient Medications  Medication Sig Dispense Refill   Ascorbic Acid (VITAMIN C) 1000 MG tablet Take 1,000 mg by mouth daily.     Azelastine HCl 137 MCG/SPRAY SOLN Place into both nostrils.     cetirizine (ZYRTEC) 10 MG tablet Take 10 mg by mouth daily.     Cholecalciferol (D3-1000 PO) Take by mouth daily.      doxycycline (VIBRA-TABS) 100 MG tablet Take 1 tablet (100 mg total) by mouth 2 (two) times daily. 20 tablet 0   escitalopram (LEXAPRO) 20 MG tablet TAKE 1 TABLET BY MOUTH EVERY DAY 90 tablet 1   fenofibrate (TRICOR) 145 MG tablet TAKE 1 TABLET BY MOUTH EVERY DAY 90 tablet 1   levothyroxine (SYNTHROID) 100 MCG tablet TAKE 1 TABLET BY MOUTH EVERY DAY IN THE MORNING 90 tablet 1   montelukast (SINGULAIR) 10 MG tablet Take 1 tablet by mouth every evening.  5   predniSONE (DELTASONE) 10 MG tablet Take 3 tablets (30 mg total) by mouth daily with breakfast. 12 tablet 0   rosuvastatin (CRESTOR) 20 MG tablet TAKE 1 TABLET BY MOUTH EVERYDAY AT BEDTIME 90 tablet 0  vitamin B-12 (CYANOCOBALAMIN) 500 MCG tablet Take 500 mcg by mouth daily.     zinc gluconate 50 MG tablet Take 50 mg by mouth daily.     hydrochlorothiazide (HYDRODIURIL) 25 MG tablet TAKE 1 TABLET BY MOUTH EVERY DAY 90 tablet 1   PROAIR RESPICLICK 177 (90 Base) MCG/ACT AEPB Inhale 2 puffs into the lungs every 4 (four) hours as needed. (Patient not taking: No sig reported)  0   No current facility-administered medications for this visit.    Allergies  Allergen Reactions   Atorvastatin Hives   Levocetirizine Other (See Comments)    Social History    Socioeconomic History   Marital status: Married    Spouse name: Not on file   Number of children: Not on file   Years of education: Not on file   Highest education level: Not on file  Occupational History   Not on file  Tobacco Use   Smoking status: Never   Smokeless tobacco: Never  Vaping Use   Vaping Use: Never used  Substance and Sexual Activity   Alcohol use: No   Drug use: No   Sexual activity: Not on file  Other Topics Concern   Not on file  Social History Narrative   Not on file   Social Determinants of Health   Financial Resource Strain: Not on file  Food Insecurity: Not on file  Transportation Needs: Not on file  Physical Activity: Not on file  Stress: Not on file  Social Connections: Not on file  Intimate Partner Violence: Not on file    Review of Systems  Constitutional: Negative.   HENT:  Positive for congestion and sinus pain. Negative for ear discharge, ear pain, hearing loss, nosebleeds, sore throat and tinnitus.   Eyes: Negative.   Respiratory:  Positive for cough and sputum production. Negative for hemoptysis, shortness of breath, wheezing and stridor.   Cardiovascular: Negative.   Gastrointestinal: Negative.   Genitourinary: Negative.   Musculoskeletal: Negative.   Skin: Negative.   Neurological: Negative.   Endo/Heme/Allergies: Negative.   Psychiatric/Behavioral: Negative.     Objective   Vitals as reported by the patient: There were no vitals filed for this visit.  Debbie [name] was seen today for sinus problem.  Diagnoses and all orders for this visit:  Bacterial sinusitis -     doxycycline (VIBRA-TABS) 100 MG tablet; Take 1 tablet (100 mg total) by mouth 2 (two) times daily. -     Discontinue: Azelastine-Fluticasone 137-50 MCG/ACT SUSP; Place 1 spray into the nose every 12 (twelve) hours. -     predniSONE (DELTASONE) 10 MG tablet; Take 3 tablets (30 mg total) by mouth daily with breakfast.   PLAN Doxycycline 100mg  po bid as pt reports  incomplete response to augmentin in past.  Continue meds as rx Can use tylenol for pain Prednisone 30mg  po qd for four days  Return if worsening or failing to improve Patient encouraged to call clinic with any questions, comments, or concerns.  I discussed the assessment and treatment plan with the patient. The patient was provided an opportunity to ask questions and all were answered. The patient agreed with the plan and demonstrated an understanding of the instructions.   The patient was advised to call back or seek an in-person evaluation if the symptoms worsen or if the condition fails to improve as anticipated.  I provided 14 minutes of non-face-to-face time during this encounter.  Maximiano Coss, NP  Primary Care at [facility]

## 2020-12-22 ENCOUNTER — Ambulatory Visit: Payer: 59 | Admitting: Registered Nurse

## 2020-12-22 ENCOUNTER — Encounter: Payer: Self-pay | Admitting: Registered Nurse

## 2020-12-22 ENCOUNTER — Other Ambulatory Visit: Payer: Self-pay

## 2020-12-22 VITALS — BP 139/80 | HR 69 | Temp 98.2°F | Resp 18 | Ht 64.0 in | Wt 254.2 lb

## 2020-12-22 DIAGNOSIS — J329 Chronic sinusitis, unspecified: Secondary | ICD-10-CM | POA: Diagnosis not present

## 2020-12-22 DIAGNOSIS — Z6841 Body Mass Index (BMI) 40.0 and over, adult: Secondary | ICD-10-CM | POA: Diagnosis not present

## 2020-12-22 NOTE — Patient Instructions (Signed)
° ° ° °  If you have lab work done today you will be contacted with your lab results within the next 2 weeks.  If you have not heard from us then please contact us. The fastest way to get your results is to register for My Chart. ° ° °IF you received an x-ray today, you will receive an invoice from [facility]. Please contact [facility] at [phone] with questions or concerns regarding your invoice.  ° °IF you received labwork today, you will receive an invoice from [facility]. Please contact [facility] at [phone] with questions or concerns regarding your invoice.  ° °Our billing staff will not be able to assist you with questions regarding bills from these companies. ° °You will be contacted with the lab results as soon as they are available. The fastest way to get your results is to activate your My Chart account. Instructions are located on the last page of this paperwork. If you have not heard from us regarding the results in 2 weeks, please contact this office. °  ° ° ° °

## 2020-12-22 NOTE — Progress Notes (Signed)
Established Patient Office Visit  Subjective:  Patient ID: Debbie [name], female    DOB: [birth year]  Age: 50 y.o. MRN: [id number]  CC:  Chief Complaint  Patient presents with   New Patient (Initial Visit)    Patient states she is here to establish care. Patient would like to discuss medications and weight loss.    HPI Debbie [name] presents for visit to est care.  Histories reviewed and updated with patient.   Concerns for:  Sinusitis Ongoing Tx with doxycycline on 12/08/20 Completed entire course, still some pressure and drainage Lingering symptoms, overall much better.   Weight: Has pursued a number of methods of weight loss in the past with varying levels of success. Interested in other options. Vocalizes understanding of healthy diet and exercise choices.  Interested in medications if she is a candidate.  Past Medical History:  Diagnosis Date   Acute renal failure (ARF) ([place]) 10/01/2015   Allergy    Depression    Elevated cholesterol    Hyperlipidemia    Hypertension    Hypothyroidism    Morbid obesity ([place]) 02/08/2019   Seasonal allergies    Sleep apnea    wears cpap    Thyroid disease     Past Surgical History:  Procedure Laterality Date   ABLATION  10/2019   ADENOIDECTOMY     COLONOSCOPY     DENTAL SURGERY     graft of mouth     Family History  Problem Relation Age of Onset   Heart disease Father    Hypertension Mother    Hyperlipidemia Mother    Breast cancer Maternal Grandmother    Prostate cancer Maternal Grandfather    Diabetes Maternal Grandfather    CAD Maternal Grandfather    Colon cancer Neg Hx    Colon polyps Neg Hx    Esophageal cancer Neg Hx    Stomach cancer Neg Hx    Rectal cancer Neg Hx     Social History   Socioeconomic History   Marital status: Married    Spouse name: Not on file   Number of children: 0   Years of education: Not on file   Highest education level: Not on file  Occupational History   Not on file   Tobacco Use   Smoking status: Never   Smokeless tobacco: Never  Vaping Use   Vaping Use: Never used  Substance and Sexual Activity   Alcohol use: No   Drug use: No   Sexual activity: Not on file  Other Topics Concern   Not on file  Social History Narrative   Not on file   Social Determinants of Health   Financial Resource Strain: Not on file  Food Insecurity: Not on file  Transportation Needs: Not on file  Physical Activity: Not on file  Stress: Not on file  Social Connections: Not on file  Intimate Partner Violence: Not on file    Outpatient Medications Prior to Visit  Medication Sig Dispense Refill   Ascorbic Acid (VITAMIN C) 1000 MG tablet Take 1,000 mg by mouth daily.     Azelastine HCl 137 MCG/SPRAY SOLN Place into both nostrils.     cetirizine (ZYRTEC) 10 MG tablet Take 10 mg by mouth daily.     Cholecalciferol (D3-1000 PO) Take by mouth daily.      escitalopram (LEXAPRO) 20 MG tablet TAKE 1 TABLET BY MOUTH EVERY DAY 90 tablet 1   fenofibrate (TRICOR) 145 MG tablet TAKE 1 TABLET BY  MOUTH EVERY DAY 90 tablet 1   levothyroxine (SYNTHROID) 100 MCG tablet TAKE 1 TABLET BY MOUTH EVERY DAY IN THE MORNING 90 tablet 1   montelukast (SINGULAIR) 10 MG tablet Take 1 tablet by mouth every evening.  5   rosuvastatin (CRESTOR) 20 MG tablet TAKE 1 TABLET BY MOUTH EVERYDAY AT BEDTIME 90 tablet 0   vitamin B-12 (CYANOCOBALAMIN) 500 MCG tablet Take 500 mcg by mouth daily.     zinc gluconate 50 MG tablet Take 50 mg by mouth daily.     doxycycline (VIBRA-TABS) 100 MG tablet Take 1 tablet (100 mg total) by mouth 2 (two) times daily. 20 tablet 0   predniSONE (DELTASONE) 10 MG tablet Take 3 tablets (30 mg total) by mouth daily with breakfast. 12 tablet 0   PROAIR RESPICLICK 814 (90 Base) MCG/ACT AEPB Inhale 2 puffs into the lungs every 4 (four) hours as needed. (Patient not taking: No sig reported)  0   hydrochlorothiazide (HYDRODIURIL) 25 MG tablet TAKE 1 TABLET BY MOUTH EVERY DAY 90  tablet 1   No facility-administered medications prior to visit.    Allergies  Allergen Reactions   Atorvastatin Hives   Levocetirizine Other (See Comments)    ROS Review of Systems  Constitutional: Negative.   HENT:  Positive for sinus pressure.   Eyes: Negative.   Respiratory: Negative.    Cardiovascular: Negative.   Gastrointestinal: Negative.   Endocrine: Negative.   Genitourinary: Negative.   Musculoskeletal: Negative.   Skin: Negative.   Neurological: Negative.   Psychiatric/Behavioral: Negative.    All other systems reviewed and are negative.    Objective:    Physical Exam Vitals and nursing note reviewed.  Constitutional:      General: She is not in acute distress.    Appearance: Normal appearance. She is normal weight. She is not ill-appearing, toxic-appearing or diaphoretic.  Cardiovascular:     Rate and Rhythm: Normal rate and regular rhythm.     Heart sounds: Normal heart sounds. No murmur heard.   No friction rub. No gallop.  Pulmonary:     Effort: Pulmonary effort is normal. No respiratory distress.     Breath sounds: Normal breath sounds. No stridor. No wheezing, rhonchi or rales.  Chest:     Chest wall: No tenderness.  Skin:    General: Skin is warm and dry.  Neurological:     General: No focal deficit present.     Mental Status: She is alert and oriented to person, place, and time. Mental status is at baseline.  Psychiatric:        Mood and Affect: Mood normal.        Behavior: Behavior normal.        Thought Content: Thought content normal.        Judgment: Judgment normal.    BP 139/80   Pulse 69   Temp 98.2 F (36.8 C) (Temporal)   Resp 18   Ht 5' 4"  (1.626 m)   Wt 254 lb 3.2 oz (115.3 kg)   SpO2 99%   BMI 43.63 kg/m  Wt Readings from Last 3 Encounters:  12/22/20 254 lb 3.2 oz (115.3 kg)  11/18/20 248 lb (112.5 kg)  11/12/20 248 lb (112.5 kg)     There are no preventive care reminders to display for this patient.  There are  no preventive care reminders to display for this patient.  Lab Results  Component Value Date   TSH 1.19 08/04/2020   Lab Results  Component Value Date   WBC 6.9 08/04/2020   HGB 13.0 08/04/2020   HCT 36.3 08/04/2020   MCV 91.1 08/04/2020   PLT 292.0 08/04/2020   Lab Results  Component Value Date   NA 140 08/04/2020   K 4.1 08/04/2020   CO2 28 08/04/2020   GLUCOSE 107 (H) 08/04/2020   BUN 18 08/04/2020   CREATININE 0.80 08/04/2020   BILITOT 0.5 08/04/2020   ALKPHOS 35 (L) 08/04/2020   AST 20 08/04/2020   ALT 18 08/04/2020   PROT 7.4 08/04/2020   ALBUMIN 4.8 08/04/2020   CALCIUM 10.2 08/04/2020   ANIONGAP 7 10/03/2015   EGFR 89 06/05/2020   GFR 86.23 08/04/2020   Lab Results  Component Value Date   CHOL 174 08/04/2020   Lab Results  Component Value Date   HDL 44.60 08/04/2020   Lab Results  Component Value Date   LDLCALC 100 (H) 08/04/2020   Lab Results  Component Value Date   TRIG 148.0 08/04/2020   Lab Results  Component Value Date   CHOLHDL 4 08/04/2020   Lab Results  Component Value Date   HGBA1C 6.1 08/04/2020      Assessment & Plan:   Problem List Items Addressed This Visit   None Visit Diagnoses     Morbid obesity with BMI of 40.0-44.9, adult ([place])    -  Primary   Relevant Medications   Semaglutide,0.25 or 0.5MG/DOS, 2 MG/1.5ML SOPN   Other Relevant Orders   CBC with Differential/Platelet   Hemoglobin A1c   Comprehensive metabolic panel   TSH   Lipid panel   Sinusitis, unspecified chronicity, unspecified location       Relevant Medications   methylPREDNISolone (MEDROL DOSEPAK) 4 MG TBPK tablet       Meds ordered this encounter  Medications   Semaglutide,0.25 or 0.5MG/DOS, 2 MG/1.5ML SOPN    Sig: Inject 0.5 mg into the skin once a week.    Dispense:  4.5 mL    Refill:  0    Order Specific Question:   Supervising Provider    Answer:   Carlota Raspberry, JEFFREY R [[id number]]   methylPREDNISolone (MEDROL DOSEPAK) 4 MG TBPK tablet    Sig:  Take per package instructions. Do not skip doses. Finish entire supply.    Dispense:  1 each    Refill:  0    Order Specific Question:   Supervising Provider    Answer:   Carlota Raspberry, JEFFREY R [[id number]]    Follow-up: Return in about 3 months (around 03/24/2021) for semglutide follow up .   PLAN Will give steroid taper for sinus symptoms. Discussed nonpharm and OTC relief. Pt can return if worsening or failing to improve - can consider ENT consult as well. Start semaglutide 0.79m subq weekly. Reviewed risks, benefits, and alternatives to this medication. Pt voices understanding. She will return for fasting labs within 1-2 weeks. Future orders placed.  Patient encouraged to call clinic with any questions, comments, or concerns.  RMaximiano Coss NP

## 2020-12-25 ENCOUNTER — Telehealth: Payer: Self-pay

## 2020-12-25 NOTE — Telephone Encounter (Signed)
Caller name:Chelsi Nyra Capes   On DPR? :Yes  Call back number:[phone]  Provider they see: Orland Mustard   Reason for call:pt come in on October the 4th and Delfino Lovett was going to put the patient on another steroid and putting patient on a shot once a week for weight loss?   Nothing was called into the pharmacy can someone check on this?

## 2020-12-28 NOTE — Telephone Encounter (Signed)
According to chart pt was taken off PRED during appt. I do see where pt has a lab appt sched tomorrrow the 11th. Please advise on rx.

## 2020-12-29 ENCOUNTER — Other Ambulatory Visit (INDEPENDENT_AMBULATORY_CARE_PROVIDER_SITE_OTHER): Payer: 59

## 2020-12-29 ENCOUNTER — Encounter: Payer: Self-pay | Admitting: Registered Nurse

## 2020-12-29 ENCOUNTER — Other Ambulatory Visit: Payer: Self-pay

## 2020-12-29 DIAGNOSIS — Z6841 Body Mass Index (BMI) 40.0 and over, adult: Secondary | ICD-10-CM | POA: Diagnosis not present

## 2020-12-29 LAB — CBC WITH DIFFERENTIAL/PLATELET
Basophils Absolute: 0 10*3/uL (ref 0.0–0.1)
Basophils Relative: 0.6 % (ref 0.0–3.0)
Eosinophils Absolute: 0.2 10*3/uL (ref 0.0–0.7)
Eosinophils Relative: 4.3 % (ref 0.0–5.0)
HCT: 37.8 % (ref 36.0–46.0)
Hemoglobin: 12.9 g/dL (ref 12.0–15.0)
Lymphocytes Relative: 42.8 % (ref 12.0–46.0)
Lymphs Abs: 2.3 10*3/uL (ref 0.7–4.0)
MCHC: 34 g/dL (ref 30.0–36.0)
MCV: 93.4 fl (ref 78.0–100.0)
Monocytes Absolute: 0.4 10*3/uL (ref 0.1–1.0)
Monocytes Relative: 6.8 % (ref 3.0–12.0)
Neutro Abs: 2.5 10*3/uL (ref 1.4–7.7)
Neutrophils Relative %: 45.5 % (ref 43.0–77.0)
Platelets: 243 10*3/uL (ref 150.0–400.0)
RBC: 4.05 Mil/uL (ref 3.87–5.11)
RDW: 13.5 % (ref 11.5–15.5)
WBC: 5.5 10*3/uL (ref 4.0–10.5)

## 2020-12-29 LAB — COMPREHENSIVE METABOLIC PANEL
ALT: 17 U/L (ref 0–35)
AST: 20 U/L (ref 0–37)
Albumin: 4.5 g/dL (ref 3.5–5.2)
Alkaline Phosphatase: 37 U/L — ABNORMAL LOW (ref 39–117)
BUN: 12 mg/dL (ref 6–23)
CO2: 26 mEq/L (ref 19–32)
Calcium: 9.8 mg/dL (ref 8.4–10.5)
Chloride: 103 mEq/L (ref 96–112)
Creatinine, Ser: 0.76 mg/dL (ref 0.40–1.20)
GFR: 91.44 mL/min (ref >60.00)
Glucose, Bld: 101 mg/dL — ABNORMAL HIGH (ref 70–99)
Potassium: 4.1 mEq/L (ref 3.5–5.1)
Sodium: 139 mEq/L (ref 135–145)
Total Bilirubin: 0.7 mg/dL (ref 0.2–1.2)
Total Protein: 7 g/dL (ref 6.0–8.3)

## 2020-12-29 LAB — LIPID PANEL
Cholesterol: 170 mg/dL (ref 0–200)
HDL: 44.1 mg/dL (ref >39.00)
LDL Cholesterol: 93 mg/dL (ref 0–99)
NonHDL: 125.42
Total CHOL/HDL Ratio: 4
Triglycerides: 163 mg/dL — ABNORMAL HIGH (ref 0.0–149.0)
VLDL: 32.6 mg/dL (ref 0.0–40.0)

## 2020-12-29 LAB — HEMOGLOBIN A1C: Hgb A1c MFr Bld: 5.9 % (ref 4.6–6.5)

## 2020-12-29 LAB — TSH: TSH: 1.05 u[IU]/mL (ref 0.35–5.50)

## 2020-12-29 MED ORDER — METHYLPREDNISOLONE 4 MG PO TBPK: ORAL_TABLET | ORAL | 0 refills | Status: DC

## 2020-12-29 MED ORDER — SEMAGLUTIDE(0.25 OR 0.5MG/DOS) 2 MG/1.5ML ~~LOC~~ SOPN: 0.5000 mg | PEN_INJECTOR | SUBCUTANEOUS | 0 refills | Status: DC

## 2020-12-29 NOTE — Telephone Encounter (Signed)
Apologies for delay -   Have sent meds. Future lab orders placed.  Thanks,  Denice Paradise

## 2020-12-30 ENCOUNTER — Other Ambulatory Visit: Payer: Self-pay | Admitting: Internal Medicine

## 2020-12-30 DIAGNOSIS — F3342 Major depressive disorder, recurrent, in full remission: Secondary | ICD-10-CM

## 2020-12-30 DIAGNOSIS — E785 Hyperlipidemia, unspecified: Secondary | ICD-10-CM

## 2020-12-31 ENCOUNTER — Other Ambulatory Visit: Payer: Self-pay | Admitting: Registered Nurse

## 2020-12-31 DIAGNOSIS — E785 Hyperlipidemia, unspecified: Secondary | ICD-10-CM

## 2020-12-31 DIAGNOSIS — F3342 Major depressive disorder, recurrent, in full remission: Secondary | ICD-10-CM

## 2020-12-31 MED ORDER — ESCITALOPRAM OXALATE 20 MG PO TABS: 20.0000 mg | ORAL_TABLET | Freq: Every day | ORAL | 1 refills | Status: DC

## 2020-12-31 MED ORDER — FENOFIBRATE 145 MG PO TABS: 145.0000 mg | ORAL_TABLET | Freq: Every day | ORAL | 1 refills | Status: DC

## 2021-01-19 ENCOUNTER — Encounter: Payer: Self-pay | Admitting: Registered Nurse

## 2021-01-19 ENCOUNTER — Other Ambulatory Visit: Payer: Self-pay | Admitting: Registered Nurse

## 2021-01-19 DIAGNOSIS — J329 Chronic sinusitis, unspecified: Secondary | ICD-10-CM

## 2021-01-19 DIAGNOSIS — E039 Hypothyroidism, unspecified: Secondary | ICD-10-CM

## 2021-01-19 DIAGNOSIS — F3342 Major depressive disorder, recurrent, in full remission: Secondary | ICD-10-CM

## 2021-01-19 DIAGNOSIS — E785 Hyperlipidemia, unspecified: Secondary | ICD-10-CM

## 2021-01-19 DIAGNOSIS — Z6841 Body Mass Index (BMI) 40.0 and over, adult: Secondary | ICD-10-CM

## 2021-01-19 MED ORDER — FENOFIBRATE 145 MG PO TABS: 145.0000 mg | ORAL_TABLET | Freq: Every day | ORAL | 1 refills | Status: DC

## 2021-01-19 MED ORDER — LEVOTHYROXINE SODIUM 100 MCG PO TABS: ORAL_TABLET | ORAL | 1 refills | Status: DC

## 2021-01-19 MED ORDER — SEMAGLUTIDE(0.25 OR 0.5MG/DOS) 2 MG/1.5ML ~~LOC~~ SOPN: 0.5000 mg | PEN_INJECTOR | SUBCUTANEOUS | 0 refills | Status: DC

## 2021-01-19 MED ORDER — ROSUVASTATIN CALCIUM 20 MG PO TABS: ORAL_TABLET | ORAL | 1 refills | Status: DC

## 2021-01-19 MED ORDER — ESCITALOPRAM OXALATE 20 MG PO TABS: 20.0000 mg | ORAL_TABLET | Freq: Every day | ORAL | 1 refills | Status: DC

## 2021-03-24 ENCOUNTER — Encounter: Payer: Self-pay | Admitting: Registered Nurse

## 2021-03-24 ENCOUNTER — Ambulatory Visit: Payer: 59 | Admitting: Registered Nurse

## 2021-03-24 VITALS — BP 160/96 | HR 68 | Temp 98.2°F | Resp 17 | Ht 64.0 in | Wt 252.2 lb

## 2021-03-24 DIAGNOSIS — B353 Tinea pedis: Secondary | ICD-10-CM

## 2021-03-24 DIAGNOSIS — Z6841 Body Mass Index (BMI) 40.0 and over, adult: Secondary | ICD-10-CM

## 2021-03-24 MED ORDER — TRIAMCINOLONE ACETONIDE 0.1 % EX CREA
1.0000 | TOPICAL_CREAM | Freq: Two times a day (BID) | CUTANEOUS | 0 refills | Status: DC
Start: 2021-03-24 — End: 2021-07-21

## 2021-03-24 MED ORDER — SEMAGLUTIDE (2 MG/DOSE) 8 MG/3ML ~~LOC~~ SOPN
2.0000 mg | PEN_INJECTOR | SUBCUTANEOUS | 0 refills | Status: DC
Start: 2021-04-20 — End: 2021-07-21

## 2021-03-24 MED ORDER — NYSTATIN 100000 UNIT/GM EX POWD: 1.0000 "application " | Freq: Three times a day (TID) | CUTANEOUS | 0 refills | Status: DC

## 2021-03-24 MED ORDER — SEMAGLUTIDE (1 MG/DOSE) 4 MG/3ML ~~LOC~~ SOPN: 1.0000 mg | PEN_INJECTOR | SUBCUTANEOUS | 0 refills | Status: DC

## 2021-03-24 MED ORDER — KETOCONAZOLE 2 % EX CREA: 1.0000 "application " | TOPICAL_CREAM | Freq: Every day | CUTANEOUS | 0 refills | Status: DC

## 2021-03-24 NOTE — Patient Instructions (Addendum)
[name] to meet you!  Call me if you need anything  Use ketoconazole daily - nystatin 3 times daily - triamcinolone once or twice daily as needed until foot infection resolves.  Increase semaglutide as rxed.  See you in mid May for a physical and labs  Thank you  Rich     If you have lab work done today you will be contacted with your lab results within the next 2 weeks.  If you have not heard from [place] then please contact us. The fastest way to get your results is to register for My Chart.   IF you received an x-ray today, you will receive an invoice from [facility]. Please contact [facility] at [phone] with questions or concerns regarding your invoice.   IF you received labwork today, you will receive an invoice from [place]. Please contact [facility] at [phone] with questions or concerns regarding your invoice.   Our billing staff will not be able to assist you with questions regarding bills from these companies.  You will be contacted with the lab results as soon as they are available. The fastest way to get your results is to activate your My Chart account. Instructions are located on the last page of this paperwork. If you have not heard from [place] regarding the results in 2 weeks, please contact this office.

## 2021-03-24 NOTE — Progress Notes (Signed)
Established Patient Office Visit  Subjective:  Patient ID: Debbie [name], female    DOB: [birth year]  Age: 51 y.o. MRN: [id number]  CC:  Chief Complaint  Patient presents with   Follow-up    3 month follow up for weight loss and to discuss left foot that itch and some blisters.    HPI Debbie [name] presents for follow up on weight loss and foot infection  Weight loss Down over 2lbs since last visit Continues healthy diet and exercise. Continues semaglutide. Tolerating well. Interested in dose increase.   Foot infection Left foot around 2-3-4 toes. Blistering, peeling, itching Has used OTC antifungal without relief. No hx fo infection No changes to sensation or nails  Past Medical History:  Diagnosis Date   Acute renal failure (ARF) ([place]) 10/01/2015   Allergy    Depression    Elevated cholesterol    Hyperlipidemia    Hypertension    Hypothyroidism    Morbid obesity ([place]) 02/08/2019   Seasonal allergies    Sleep apnea    wears cpap    Thyroid disease     Past Surgical History:  Procedure Laterality Date   ABLATION  10/2019   ADENOIDECTOMY     COLONOSCOPY     DENTAL SURGERY     graft of mouth     Family History  Problem Relation Age of Onset   Heart disease Father    Hypertension Mother    Hyperlipidemia Mother    Breast cancer Maternal Grandmother    Prostate cancer Maternal Grandfather    Diabetes Maternal Grandfather    CAD Maternal Grandfather    Colon cancer Neg Hx    Colon polyps Neg Hx    Esophageal cancer Neg Hx    Stomach cancer Neg Hx    Rectal cancer Neg Hx     Social History   Socioeconomic History   Marital status: Married    Spouse name: Not on file   Number of children: 0   Years of education: Not on file   Highest education level: Not on file  Occupational History   Not on file  Tobacco Use   Smoking status: Never   Smokeless tobacco: Never  Vaping Use   Vaping Use: Never used  Substance and Sexual Activity   Alcohol use:  No   Drug use: No   Sexual activity: Not on file  Other Topics Concern   Not on file  Social History Narrative   Not on file   Social Determinants of Health   Financial Resource Strain: Not on file  Food Insecurity: Not on file  Transportation Needs: Not on file  Physical Activity: Not on file  Stress: Not on file  Social Connections: Not on file  Intimate Partner Violence: Not on file    Outpatient Medications Prior to Visit  Medication Sig Dispense Refill   Ascorbic Acid (VITAMIN C) 1000 MG tablet Take 1,000 mg by mouth daily.     Azelastine HCl 137 MCG/SPRAY SOLN Place into both nostrils.     cetirizine (ZYRTEC) 10 MG tablet Take 10 mg by mouth daily.     Cholecalciferol (D3-1000 PO) Take by mouth daily.      escitalopram (LEXAPRO) 20 MG tablet Take 1 tablet (20 mg total) by mouth daily. 90 tablet 1   fenofibrate (TRICOR) 145 MG tablet Take 1 tablet (145 mg total) by mouth daily. 90 tablet 1   levothyroxine (SYNTHROID) 100 MCG tablet TAKE 1 TABLET BY MOUTH  EVERY DAY IN THE MORNING 90 tablet 1   montelukast (SINGULAIR) 10 MG tablet Take 1 tablet by mouth every evening.  5   rosuvastatin (CRESTOR) 20 MG tablet TAKE 1 TABLET BY MOUTH EVERYDAY AT BEDTIME 90 tablet 1   vitamin B-12 (CYANOCOBALAMIN) 500 MCG tablet Take 500 mcg by mouth daily.     zinc gluconate 50 MG tablet Take 50 mg by mouth daily.     Semaglutide,0.25 or 0.5MG/DOS, 2 MG/1.5ML SOPN Inject 0.5 mg into the skin once a week. 4.5 mL 0   PROAIR RESPICLICK 254 (90 Base) MCG/ACT AEPB Inhale 2 puffs into the lungs every 4 (four) hours as needed. (Patient not taking: Reported on 11/18/2020)  0   No facility-administered medications prior to visit.    Allergies  Allergen Reactions   Atorvastatin Hives   Levocetirizine Other (See Comments)    ROS Review of Systems  Constitutional: Negative.   HENT: Negative.    Eyes: Negative.   Respiratory: Negative.    Cardiovascular: Negative.   Gastrointestinal: Negative.    Genitourinary: Negative.   Musculoskeletal: Negative.   Skin:  Positive for rash. Negative for color change, pallor and wound.  Neurological: Negative.   Psychiatric/Behavioral: Negative.       Objective:    Physical Exam Vitals and nursing note reviewed.  Constitutional:      General: She is not in acute distress.    Appearance: Normal appearance. She is normal weight. She is not ill-appearing, toxic-appearing or diaphoretic.  Cardiovascular:     Rate and Rhythm: Normal rate and regular rhythm.     Heart sounds: Normal heart sounds. No murmur heard.   No friction rub. No gallop.  Pulmonary:     Effort: Pulmonary effort is normal. No respiratory distress.     Breath sounds: Normal breath sounds. No stridor. No wheezing, rhonchi or rales.  Chest:     Chest wall: No tenderness.  Skin:    General: Skin is warm and dry.     Findings: Rash (apparent fungal infection around 2-3-4 toes of L foot.) present.  Neurological:     General: No focal deficit present.     Mental Status: She is alert and oriented to person, place, and time. Mental status is at baseline.  Psychiatric:        Mood and Affect: Mood normal.        Behavior: Behavior normal.        Thought Content: Thought content normal.        Judgment: Judgment normal.    BP (!) 160/96    Pulse 68    Temp 98.2 F (36.8 C) (Temporal)    Resp 17    Ht 5' 4"  (1.626 m)    Wt 252 lb 3.2 oz (114.4 kg)    SpO2 99%    BMI 43.29 kg/m  Wt Readings from Last 3 Encounters:  03/24/21 252 lb 3.2 oz (114.4 kg)  12/22/20 254 lb 3.2 oz (115.3 kg)  11/18/20 248 lb (112.5 kg)     Health Maintenance Due  Topic Date Due   COVID-19 Vaccine (5 - Booster for Pfizer series) 04/15/2020    There are no preventive care reminders to display for this patient.  Lab Results  Component Value Date   TSH 1.05 12/29/2020   Lab Results  Component Value Date   WBC 5.5 12/29/2020   HGB 12.9 12/29/2020   HCT 37.8 12/29/2020   MCV 93.4  12/29/2020   PLT 243.0 12/29/2020  Lab Results  Component Value Date   NA 139 12/29/2020   K 4.1 12/29/2020   CO2 26 12/29/2020   GLUCOSE 101 (H) 12/29/2020   BUN 12 12/29/2020   CREATININE 0.76 12/29/2020   BILITOT 0.7 12/29/2020   ALKPHOS 37 (L) 12/29/2020   AST 20 12/29/2020   ALT 17 12/29/2020   PROT 7.0 12/29/2020   ALBUMIN 4.5 12/29/2020   CALCIUM 9.8 12/29/2020   ANIONGAP 7 10/03/2015   EGFR 89 06/05/2020   GFR 91.44 12/29/2020   Lab Results  Component Value Date   CHOL 170 12/29/2020   Lab Results  Component Value Date   HDL 44.10 12/29/2020   Lab Results  Component Value Date   LDLCALC 93 12/29/2020   Lab Results  Component Value Date   TRIG 163.0 (H) 12/29/2020   Lab Results  Component Value Date   CHOLHDL 4 12/29/2020   Lab Results  Component Value Date   HGBA1C 5.9 12/29/2020      Assessment & Plan:   Problem List Items Addressed This Visit   None Visit Diagnoses     Morbid obesity with BMI of 40.0-44.9, adult ([place])    -  Primary   Relevant Medications   Semaglutide, 1 MG/DOSE, 4 MG/3ML SOPN   Semaglutide, 2 MG/DOSE, 8 MG/3ML SOPN (Start on 04/20/2021)   Tinea pedis of left foot       Relevant Medications   ketoconazole (NIZORAL) 2 % cream   triamcinolone cream (KENALOG) 0.1 %   nystatin (MYCOSTATIN/NYSTOP) powder       Meds ordered this encounter  Medications   Semaglutide, 1 MG/DOSE, 4 MG/3ML SOPN    Sig: Inject 1 mg as directed once a week.    Dispense:  3 mL    Refill:  0    Order Specific Question:   Supervising Provider    Answer:   Carlota Raspberry, JEFFREY R [[id number]]   Semaglutide, 2 MG/DOSE, 8 MG/3ML SOPN    Sig: Inject 2 mg as directed once a week.    Dispense:  9 mL    Refill:  0    Order Specific Question:   Supervising Provider    Answer:   Carlota Raspberry, JEFFREY R [[id number]]   ketoconazole (NIZORAL) 2 % cream    Sig: Apply 1 application topically daily.    Dispense:  15 g    Refill:  0    Order Specific Question:   Supervising  Provider    Answer:   Carlota Raspberry, JEFFREY R [[id number]]   triamcinolone cream (KENALOG) 0.1 %    Sig: Apply 1 application topically 2 (two) times daily.    Dispense:  30 g    Refill:  0    Order Specific Question:   Supervising Provider    Answer:   Carlota Raspberry, JEFFREY R [[id number]]   nystatin (MYCOSTATIN/NYSTOP) powder    Sig: Apply 1 application topically 3 (three) times daily.    Dispense:  15 g    Refill:  0    Order Specific Question:   Supervising Provider    Answer:   Carlota Raspberry, JEFFREY R [[id number]]    Follow-up: Return in about 4 months (around 07/22/2021) for After 08/05/20 for CPE and labs.   PLAN Ketoconazole and nystatin for foot infection. If worsening or failing to improve can consider alternatives.  Increase semaglutide to 25m weekly for 1 mo then to 242mweekly for 3 mo CPE and labs in 4 mo Patient encouraged to call clinic with any questions, comments,  or concerns.  Maximiano Coss, NP

## 2021-05-20 ENCOUNTER — Other Ambulatory Visit (HOSPITAL_COMMUNITY)
Admission: RE | Admit: 2021-05-20 | Discharge: 2021-05-20 | Disposition: A | Discharge status: Home / Self Care | Payer: 59 | Source: Ambulatory Visit | Attending: Obstetrics and Gynecology | Admitting: Obstetrics and Gynecology

## 2021-05-20 ENCOUNTER — Other Ambulatory Visit: Payer: Self-pay

## 2021-05-20 ENCOUNTER — Encounter: Payer: Self-pay | Admitting: Obstetrics and Gynecology

## 2021-05-20 ENCOUNTER — Ambulatory Visit: Payer: 59 | Admitting: Obstetrics and Gynecology

## 2021-05-20 VITALS — BP 156/106 | HR 75 | Ht 64.0 in | Wt 245.8 lb

## 2021-05-20 DIAGNOSIS — Z9889 Other specified postprocedural states: Secondary | ICD-10-CM

## 2021-05-20 DIAGNOSIS — Z1239 Encounter for other screening for malignant neoplasm of breast: Secondary | ICD-10-CM

## 2021-05-20 DIAGNOSIS — Z124 Encounter for screening for malignant neoplasm of cervix: Secondary | ICD-10-CM | POA: Insufficient documentation

## 2021-05-20 DIAGNOSIS — Z1231 Encounter for screening mammogram for malignant neoplasm of breast: Secondary | ICD-10-CM | POA: Diagnosis not present

## 2021-05-20 DIAGNOSIS — Z01419 Encounter for gynecological examination (general) (routine) without abnormal findings: Secondary | ICD-10-CM

## 2021-05-20 HISTORY — DX: Other specified postprocedural states: Z98.890

## 2021-05-20 LAB — POCT URINALYSIS DIP (DEVICE)
Bilirubin Urine: NEGATIVE
Glucose, UA: NEGATIVE mg/dL
Hgb urine dipstick: NEGATIVE
Ketones, ur: NEGATIVE mg/dL
Leukocytes,Ua: NEGATIVE
Nitrite: NEGATIVE
Protein, ur: NEGATIVE mg/dL
Specific Gravity, Urine: 1.025 (ref 1.005–1.030)
Urobilinogen, UA: 0.2 mg/dL (ref 0.0–1.0)
pH: 7 (ref 5.0–8.0)

## 2021-05-20 NOTE — Progress Notes (Signed)
Debbie [name] is a 51 y.o. No obstetric history on file. female here for a routine annual gynecologic exam.  Current complaints: none.   Denies abnormal vaginal bleeding, discharge, pelvic pain, problems with intercourse or other gynecologic concerns.  ?  ?Gynecologic History ?No LMP recorded. Patient has had an ablation. ?Contraception: none ?Last Pap:  5 yrs ago. Results were: normal ?Last mammogram: NA ? ?Obstetric History ?OB History  ?No obstetric history on file.  ? ? ?Past Medical History:  ?Diagnosis Date  ? Acute renal failure (ARF) ([place]) 10/01/2015  ? Allergy   ? Depression   ? Elevated cholesterol   ? Hyperlipidemia   ? Hypertension   ? Hypothyroidism   ? Morbid obesity ([place]) 02/08/2019  ? Seasonal allergies   ? Sleep apnea   ? wears cpap   ? Thyroid disease   ? ? ?Past Surgical History:  ?Procedure Laterality Date  ? ABLATION  10/2019  ? ADENOIDECTOMY    ? COLONOSCOPY    ? DENTAL SURGERY    ? graft of mouth   ? ? ?Current Outpatient Medications on File Prior to Visit  ?Medication Sig Dispense Refill  ? Ascorbic Acid (VITAMIN C) 1000 MG tablet Take 1,000 mg by mouth daily.    ? Azelastine HCl 137 MCG/SPRAY SOLN Place into both nostrils.    ? cetirizine (ZYRTEC) 10 MG tablet Take 10 mg by mouth daily.    ? Cholecalciferol (D3-1000 PO) Take by mouth daily.     ? escitalopram (LEXAPRO) 20 MG tablet Take 1 tablet (20 mg total) by mouth daily. 90 tablet 1  ? fenofibrate (TRICOR) 145 MG tablet Take 1 tablet (145 mg total) by mouth daily. 90 tablet 1  ? ketoconazole (NIZORAL) 2 % cream Apply 1 application topically daily. 15 g 0  ? levothyroxine (SYNTHROID) 100 MCG tablet TAKE 1 TABLET BY MOUTH EVERY DAY IN THE MORNING 90 tablet 1  ? montelukast (SINGULAIR) 10 MG tablet Take 1 tablet by mouth every evening.  5  ? nystatin (MYCOSTATIN/NYSTOP) powder Apply 1 application topically 3 (three) times daily. 15 g 0  ? rosuvastatin (CRESTOR) 20 MG tablet TAKE 1 TABLET BY MOUTH EVERYDAY AT BEDTIME 90 tablet 1  ?  Semaglutide, 2 MG/DOSE, 8 MG/3ML SOPN Inject 2 mg as directed once a week. 9 mL 0  ? triamcinolone cream (KENALOG) 0.1 % Apply 1 application topically 2 (two) times daily. 30 g 0  ? vitamin B-12 (CYANOCOBALAMIN) 500 MCG tablet Take 500 mcg by mouth daily.    ? zinc gluconate 50 MG tablet Take 50 mg by mouth daily.    ? PROAIR RESPICLICK 024 (90 Base) MCG/ACT AEPB Inhale 2 puffs into the lungs every 4 (four) hours as needed. (Patient not taking: Reported on 11/18/2020)  0  ? Semaglutide, 1 MG/DOSE, 4 MG/3ML SOPN Inject 1 mg as directed once a week. 3 mL 0  ? ?No current facility-administered medications on file prior to visit.  ? ? ?Allergies  ?Allergen Reactions  ? Atorvastatin Hives  ? ? ?Social History  ? ?Socioeconomic History  ? Marital status: Married  ?  Spouse name: Not on file  ? Number of children: 0  ? Years of education: Not on file  ? Highest education level: Not on file  ?Occupational History  ? Not on file  ?Tobacco Use  ? Smoking status: Never  ? Smokeless tobacco: Never  ?Vaping Use  ? Vaping Use: Never used  ?Substance and Sexual Activity  ? Alcohol  use: No  ? Drug use: No  ? Sexual activity: Not on file  ?Other Topics Concern  ? Not on file  ?Social History Narrative  ? Not on file  ? ?Social Determinants of Health  ? ?Financial Resource Strain: Not on file  ?Food Insecurity: No Food Insecurity  ? Worried About Charity fundraiser in the Last Year: Never true  ? Ran Out of Food in the Last Year: Never true  ?Transportation Needs: No Transportation Needs  ? Lack of Transportation (Medical): No  ? Lack of Transportation (Non-Medical): No  ?Physical Activity: Not on file  ?Stress: Not on file  ?Social Connections: Not on file  ?Intimate Partner Violence: Not on file  ? ? ?Family History  ?Problem Relation Age of Onset  ? Heart disease Father   ? Hypertension Mother   ? Hyperlipidemia Mother   ? Breast cancer Maternal Grandmother   ? Prostate cancer Maternal Grandfather   ? Diabetes Maternal Grandfather    ? CAD Maternal Grandfather   ? Colon cancer Neg Hx   ? Colon polyps Neg Hx   ? Esophageal cancer Neg Hx   ? Stomach cancer Neg Hx   ? Rectal cancer Neg Hx   ? ? ?The following portions of the patient's history were reviewed and updated as appropriate: allergies, current medications, past family history, past medical history, past social history, past surgical history and problem list. ? ?Review of Systems ?Pertinent items noted in HPI and remainder of comprehensive ROS otherwise negative. ?  ?Objective:  ?BP (!) 156/106   Pulse 75   Ht 5\' 4"  (1.626 m)   Wt 245 lb 12.8 oz (111.5 kg)   BMI 42.19 kg/m?  ?CONSTITUTIONAL: Well-developed, well-nourished female in no acute distress.  ?HENT:  Normocephalic, atraumatic, External right and left ear normal. Oropharynx is clear and moist ?EYES: Conjunctivae and EOM are normal. Pupils are equal, round, and reactive to light. No scleral icterus.  ?NECK: Normal range of motion, supple, no masses.  Normal thyroid.  ?SKIN: Skin is warm and dry. No rash noted. Not diaphoretic. No erythema. No pallor. ?[place]: Alert and oriented to person, place, and time. Normal reflexes, muscle tone coordination. No cranial nerve deficit noted. ?PSYCHIATRIC: Normal mood and affect. Normal behavior. Normal judgment and thought content. ?CARDIOVASCULAR: Normal heart rate noted, regular rhythm ?RESPIRATORY: Clear to auscultation bilaterally. Effort and breath sounds normal, no problems with respiration noted. ?BREASTS: Symmetric in size. No masses, skin changes, nipple drainage, or lymphadenopathy. ?ABDOMEN: Soft, normal bowel sounds, no distention noted.  No tenderness, rebound or guarding.  ?PELVIC: Normal appearing external genitalia; normal appearing vaginal mucosa and cervix.  No abnormal discharge noted.  Pap smear obtained.  Normal uterine size, no other palpable masses, no uterine or adnexal tenderness. ?MUSCULOSKELETAL: Normal range of motion. No tenderness.  No cyanosis, clubbing, or  edema.  2+ distal pulses. ? ? ?Assessment:  ?Annual gynecologic examination with pap smear ?  ?Plan:  ?Will follow up results of pap smear and manage accordingly. ?Mammogram scheduled ?Routine preventative health maintenance measures emphasized. ?Please refer to After Visit Summary for other counseling recommendations.  ? ? ?Chancy Milroy, MD, FACOG ?Attending [place] for [employer], [place]  ?

## 2021-05-20 NOTE — Patient Instructions (Signed)

## 2021-05-21 LAB — CYTOLOGY - PAP
Adequacy: ABSENT
Comment: NEGATIVE
Diagnosis: NEGATIVE
High risk HPV: NEGATIVE

## 2021-06-09 ENCOUNTER — Encounter: Payer: Self-pay | Admitting: Registered Nurse

## 2021-06-09 NOTE — Telephone Encounter (Signed)
Patient is calling in stating that she may believe she has Salmonella. Advised it was best that she schedule a virtual or in office visit and she said she has had this before and ended up in the hospital. Debbie [name] is wanting to know if Debbie [name] is wanting advice or asking if she needs to just go to the ED. Explained to patient that it would be best to go ahead and schedule but because she is declining that I would send a message back.  ?

## 2021-06-11 ENCOUNTER — Other Ambulatory Visit: Payer: Self-pay

## 2021-06-11 ENCOUNTER — Emergency Department (HOSPITAL_COMMUNITY)
Admission: EM | Admit: 2021-06-11 | Discharge: 2021-06-11 | Disposition: A | Discharge status: Home / Self Care | Payer: 59 | Attending: Emergency Medicine | Admitting: Emergency Medicine

## 2021-06-11 ENCOUNTER — Emergency Department (HOSPITAL_COMMUNITY): Payer: 59

## 2021-06-11 ENCOUNTER — Encounter (HOSPITAL_COMMUNITY): Payer: Self-pay

## 2021-06-11 DIAGNOSIS — Z79899 Other long term (current) drug therapy: Secondary | ICD-10-CM | POA: Insufficient documentation

## 2021-06-11 DIAGNOSIS — R1084 Generalized abdominal pain: Secondary | ICD-10-CM | POA: Insufficient documentation

## 2021-06-11 DIAGNOSIS — R197 Diarrhea, unspecified: Secondary | ICD-10-CM | POA: Diagnosis present

## 2021-06-11 DIAGNOSIS — E039 Hypothyroidism, unspecified: Secondary | ICD-10-CM | POA: Insufficient documentation

## 2021-06-11 DIAGNOSIS — R110 Nausea: Secondary | ICD-10-CM | POA: Diagnosis not present

## 2021-06-11 LAB — URINALYSIS, ROUTINE W REFLEX MICROSCOPIC
Bilirubin Urine: NEGATIVE
Glucose, UA: NEGATIVE mg/dL
Ketones, ur: NEGATIVE mg/dL
Nitrite: NEGATIVE
Protein, ur: 100 mg/dL — AB
Specific Gravity, Urine: 1.01 (ref 1.005–1.030)
pH: 6 (ref 5.0–8.0)

## 2021-06-11 LAB — COMPREHENSIVE METABOLIC PANEL
ALT: 30 U/L (ref 0–44)
AST: 41 U/L (ref 15–41)
Albumin: 4.6 g/dL (ref 3.5–5.0)
Alkaline Phosphatase: 44 U/L (ref 38–126)
Anion gap: 10 (ref 5–15)
BUN: 8 mg/dL (ref 6–20)
CO2: 22 mmol/L (ref 22–32)
Calcium: 9.9 mg/dL (ref 8.9–10.3)
Chloride: 106 mmol/L (ref 98–111)
Creatinine, Ser: 0.98 mg/dL (ref 0.44–1.00)
GFR, Estimated: >60 mL/min (ref >60)
Glucose, Bld: 100 mg/dL — ABNORMAL HIGH (ref 70–99)
Potassium: 4.2 mmol/L (ref 3.5–5.1)
Sodium: 138 mmol/L (ref 135–145)
Total Bilirubin: 1.7 mg/dL — ABNORMAL HIGH (ref 0.3–1.2)
Total Protein: 7.5 g/dL (ref 6.5–8.1)

## 2021-06-11 LAB — GASTROINTESTINAL PANEL BY PCR, STOOL (REPLACES STOOL CULTURE)

## 2021-06-11 LAB — CBC
HCT: 41.1 % (ref 36.0–46.0)
Hemoglobin: 14.1 g/dL (ref 12.0–15.0)
MCH: 31.3 pg (ref 26.0–34.0)
MCHC: 34.3 g/dL (ref 30.0–36.0)
MCV: 91.1 fL (ref 80.0–100.0)
Platelets: 340 10*3/uL (ref 150–400)
RBC: 4.51 MIL/uL (ref 3.87–5.11)
RDW: 12.7 % (ref 11.5–15.5)
WBC: 9.1 10*3/uL (ref 4.0–10.5)
nRBC: 0 % (ref 0.0–0.2)

## 2021-06-11 LAB — I-STAT BETA HCG BLOOD, ED (MC, WL, AP ONLY): I-stat hCG, quantitative: <5 m[IU]/mL (ref <5)

## 2021-06-11 LAB — C DIFFICILE QUICK SCREEN W PCR REFLEX
C Diff antigen: NEGATIVE
C Diff interpretation: NEGATIVE
C Diff toxin: NEGATIVE

## 2021-06-11 LAB — URINALYSIS, MICROSCOPIC (REFLEX)

## 2021-06-11 LAB — LIPASE, BLOOD: Lipase: 36 U/L (ref 11–51)

## 2021-06-11 MED ORDER — ONDANSETRON 4 MG PO TBDP
ORAL_TABLET | ORAL | 0 refills | Status: DC
Start: 2021-06-11 — End: 2021-11-08

## 2021-06-11 MED ORDER — DICYCLOMINE HCL 20 MG PO TABS: 20.0000 mg | ORAL_TABLET | Freq: Three times a day (TID) | ORAL | 0 refills | Status: DC | PRN

## 2021-06-11 MED ORDER — DICYCLOMINE HCL 10 MG PO CAPS
20.0000 mg | ORAL_CAPSULE | Freq: Once | ORAL | Status: AC
  Administered 2021-06-11: 20 mg via ORAL
  Filled 2021-06-11: qty 2

## 2021-06-11 MED ORDER — IOHEXOL 300 MG/ML  SOLN
100.0000 mL | Freq: Once | INTRAMUSCULAR | Status: AC | PRN
  Administered 2021-06-11: 100 mL via INTRAVENOUS

## 2021-06-11 MED ORDER — SODIUM CHLORIDE 0.9 % IV BOLUS
1000.0000 mL | Freq: Once | INTRAVENOUS | Status: AC
  Administered 2021-06-11: 1000 mL via INTRAVENOUS

## 2021-06-11 MED ORDER — ONDANSETRON HCL 4 MG/2ML IJ SOLN
4.0000 mg | Freq: Once | INTRAMUSCULAR | Status: AC
  Administered 2021-06-11: 4 mg via INTRAVENOUS
  Filled 2021-06-11: qty 2

## 2021-06-11 NOTE — ED Provider Notes (Signed)
?[facility] ?Provider Note ? ? ?CSN: [id number] ?Arrival date & time: 06/11/21  2951 ? ?  ? ?History ? ?Chief Complaint  ?Patient presents with  ? Nausea  ? Diarrhea  ? ? ?Debbie [name] is a 51 y.o. female. ? ?Debbie [name] is a 51 y.o. female with a history of prior Salmonella infection as well as hypothyroidism and hyperlipidemia, who presents to the emergency department for evaluation of diarrhea.  Patient has been having persistent diarrhea for 5 days now after eating out at a [employer].  She reports she chicken and later that day started to feel quite poorly.  She reports she has been having at least 15 episodes of watery diarrhea daily.  She has not seen any blood in her stool.  She reports mild generalized abdominal discomfort that intermittently worsens.  She reports persistent nausea without vomiting.  She reports that yesterday she felt like she was finally getting a little bit of improvement in her diarrhea had slowed down but as soon as she tried to eat some soup chicken noodle soup she immediately had worsening diarrhea.  Reports that several years ago she had similar symptoms and at that time was diagnosed with Salmonella and was worried this may be the same.  She reports at that time she had become significantly dehydrated and required hospitalization.  No personal history of C. difficile and she does not think she has been on any antibiotics recently.  No abdominal surgeries. ? ?The history is provided by the patient and medical records.  ? ?  ? ?Home Medications ?Prior to Admission medications   ?Medication Sig Start Date End Date Taking? Authorizing Provider  ?Ascorbic Acid (VITAMIN C) 1000 MG tablet Take 1,000 mg by mouth daily.   Yes [provider]  ?Azelastine-Fluticasone 137-50 MCG/ACT SUSP Place 1 spray into both nostrils 2 (two) times daily. 03/25/21  Yes [provider]  ?BIOTIN PO Take 1 tablet by mouth daily.   Yes [provider]  ?cetirizine (ZYRTEC) 10 MG tablet Take 10 mg by mouth daily.   Yes [provider]  ?Cholecalciferol (D3-1000 PO) Take 1,000 Units by mouth daily.   Yes [provider]  ?escitalopram (LEXAPRO) 20 MG tablet Take 1 tablet (20 mg total) by mouth daily. 01/19/21  Yes Maximiano Coss, NP  ?fenofibrate (TRICOR) 145 MG tablet Take 1 tablet (145 mg total) by mouth daily. 01/19/21  Yes Maximiano Coss, NP  ?levothyroxine (SYNTHROID) 100 MCG tablet TAKE 1 TABLET BY MOUTH EVERY DAY IN THE MORNING ?Patient taking differently: Take 100 mcg by mouth daily. 01/19/21  Yes Maximiano Coss, NP  ?montelukast (SINGULAIR) 10 MG tablet Take 1 tablet by mouth every evening. 07/13/15  Yes [provider]  ?Multiple Vitamins-Minerals (HAIR/SKIN/NAILS) CAPS Take 1 capsule by mouth daily.   Yes [provider]  ?rosuvastatin (CRESTOR) 20 MG tablet TAKE 1 TABLET BY MOUTH EVERYDAY AT BEDTIME ?Patient taking differently: Take 20 mg by mouth daily. 01/19/21  Yes Maximiano Coss, NP  ?vitamin B-12 (CYANOCOBALAMIN) 500 MCG tablet Take 500 mcg by mouth daily.   Yes [provider]  ?zinc gluconate 50 MG tablet Take 50 mg by mouth daily.   Yes [provider]  ?ketoconazole (NIZORAL) 2 % cream Apply 1 application topically daily. ?Patient not taking: Reported on 06/11/2021 03/24/21   Maximiano Coss, NP  ?nystatin (MYCOSTATIN/NYSTOP) powder Apply 1 application topically 3 (three) times daily. ?Patient not taking: Reported on 06/11/2021 03/24/21  Maximiano Coss, NP  ?Semaglutide, 2 MG/DOSE, 8 MG/3ML SOPN Inject 2 mg as directed once a week. ?Patient taking differently: Inject 2 mg as directed every Wednesday. 04/20/21   Maximiano Coss, NP  ?triamcinolone cream (KENALOG) 0.1 % Apply 1 application topically 2 (two) times daily. ?Patient not taking: Reported on 06/11/2021 03/24/21   Maximiano Coss, NP  ?   ? ?Allergies    ?Atorvastatin   ? ?Review of Systems   ?Review of Systems   ?Constitutional:  Positive for chills. Negative for fever.  ?HENT: Negative.    ?Respiratory:  Negative for cough and shortness of breath.   ?Cardiovascular:  Negative for chest pain.  ?Gastrointestinal:  Positive for abdominal pain, diarrhea and nausea. Negative for blood in stool and vomiting.  ?Genitourinary:  Negative for dysuria.  ?Musculoskeletal:  Negative for arthralgias and myalgias.  ?Skin:  Negative for color change and rash.  ?Neurological:  Negative for dizziness, syncope and light-headedness.  ?All other systems reviewed and are negative. ? ?Physical Exam ?Updated Vital Signs ?BP (!) 168/93 (BP Location: Right Arm)   Pulse 93   Temp 98.3 ?F (36.8 ?C) (Oral)   Resp 16   Ht '5\' 4"'$  (1.626 m)   Wt 106.1 kg   SpO2 99%   BMI 40.17 kg/m?  ?Physical Exam ?Vitals and nursing note reviewed.  ?Constitutional:   ?   General: She is not in acute distress. ?   Appearance: Normal appearance. She is well-developed. She is not ill-appearing or diaphoretic.  ?HENT:  ?   Head: Normocephalic and atraumatic.  ?   Mouth/Throat:  ?   Mouth: Mucous membranes are moist.  ?   Pharynx: Oropharynx is clear.  ?Eyes:  ?   General:     ?   Right eye: No discharge.     ?   Left eye: No discharge.  ?Cardiovascular:  ?   Rate and Rhythm: Normal rate and regular rhythm.  ?   Pulses: Normal pulses.  ?   Heart sounds: Normal heart sounds.  ?Pulmonary:  ?   Effort: Pulmonary effort is normal. No respiratory distress.  ?   Breath sounds: Normal breath sounds. No wheezing or rales.  ?   Comments: Respirations equal and unlabored, patient able to speak in full sentences, lungs clear to auscultation bilaterally  ?Abdominal:  ?   General: Bowel sounds are normal. There is no distension.  ?   Palpations: Abdomen is soft. There is no mass.  ?   Tenderness: There is abdominal tenderness. There is no guarding.  ?   Comments: Abdomen soft, nondistended, bowel sounds present, mild generalized abdominal tenderness noted that does not localize  to 1 quadrant, no guarding or peritoneal signs.  ?Musculoskeletal:     ?   General: No deformity.  ?   Cervical back: Neck supple.  ?Skin: ?   General: Skin is warm and dry.  ?   Capillary Refill: Capillary refill takes less than 2 seconds.  ?Neurological:  ?   Mental Status: She is alert and oriented to person, place, and time.  ?   Coordination: Coordination normal.  ?   Comments: Speech is clear, able to follow commands ?Moves extremities without ataxia, coordination intact  ?Psychiatric:     ?   Mood and Affect: Mood normal.     ?   Behavior: Behavior normal.  ? ? ?ED Results / Procedures / Treatments   ?Labs ?(all labs ordered are listed, but only abnormal results are displayed) ?  Labs Reviewed  ?COMPREHENSIVE METABOLIC PANEL - Abnormal; Notable for the following components:  ?    Result Value  ? Glucose, Bld 100 (*)   ? Total Bilirubin 1.7 (*)   ? All other components within normal limits  ?URINALYSIS, ROUTINE W REFLEX MICROSCOPIC - Abnormal; Notable for the following components:  ? Hgb urine dipstick TRACE (*)   ? Protein, ur 100 (*)   ? Leukocytes,Ua SMALL (*)   ? All other components within normal limits  ?URINALYSIS, MICROSCOPIC (REFLEX) - Abnormal; Notable for the following components:  ? Bacteria, UA MANY (*)   ? All other components within normal limits  ?C DIFFICILE QUICK SCREEN W PCR REFLEX    ?GASTROINTESTINAL PANEL BY PCR, STOOL (REPLACES STOOL CULTURE)  ?LIPASE, BLOOD  ?CBC  ?I-STAT BETA HCG BLOOD, ED ([facility], [facility], [facility] ONLY)  ? ? ?EKG ?None ? ?Radiology ?CT Abdomen Pelvis W Contrast ? ?Result Date: 06/11/2021 ?CLINICAL DATA:  51 year old female with abdominal pain and diarrhea. EXAM: CT ABDOMEN AND PELVIS WITH CONTRAST TECHNIQUE: Multidetector CT imaging of the abdomen and pelvis was performed using the standard protocol following bolus administration of intravenous contrast. RADIATION DOSE REDUCTION: This exam was performed according to the departmental dose-optimization program which includes automated  exposure control, adjustment of the mA and/or kV according to patient size and/or use of iterative reconstruction technique. CONTRAST:  124m OMNIPAQUE IOHEXOL 300 MG/ML  SOLN COMPARISON:  None. FINDINGS: Lower chest: No acute a

## 2021-06-11 NOTE — ED Triage Notes (Signed)
Pt arrived POV from home c/o nausea and diarrhea since  Monday. Pt was told to come by her PCP. Pt has had this before and it was salmonella.   ?

## 2021-06-11 NOTE — Discharge Instructions (Addendum)
Your CT scan and blood work today is reassuring, your C. difficile test was negative and CT scan did not show signs of colitis.  You have a GI pathogen panel pending and will be called in a day or 2 when this returns if there are any positive results that require treatment with antibiotics.  To help treat your symptoms in the meantime you can use Zofran as needed for nausea and Bentyl to help with cramping abdominal pain.  To help with diarrhea please eat a very bland diet for the next several days.  Follow-up with your primary care doctor if symptoms or not improving if you have worsening abdominal pain develop fevers, blood in your stool or other new or concerning symptoms return to the ED. ?

## 2021-07-15 ENCOUNTER — Ambulatory Visit
Admission: RE | Admit: 2021-07-15 | Discharge: 2021-07-15 | Disposition: A | Discharge status: Home / Self Care | Payer: 59 | Source: Ambulatory Visit | Attending: Obstetrics and Gynecology | Admitting: Obstetrics and Gynecology

## 2021-07-15 DIAGNOSIS — Z1239 Encounter for other screening for malignant neoplasm of breast: Secondary | ICD-10-CM

## 2021-07-15 DIAGNOSIS — Z1231 Encounter for screening mammogram for malignant neoplasm of breast: Secondary | ICD-10-CM

## 2021-07-15 DIAGNOSIS — Z01419 Encounter for gynecological examination (general) (routine) without abnormal findings: Secondary | ICD-10-CM

## 2021-07-21 ENCOUNTER — Encounter: Payer: Self-pay | Admitting: Registered Nurse

## 2021-07-21 ENCOUNTER — Other Ambulatory Visit: Payer: Self-pay | Admitting: Registered Nurse

## 2021-07-21 DIAGNOSIS — B353 Tinea pedis: Secondary | ICD-10-CM

## 2021-07-21 DIAGNOSIS — J329 Chronic sinusitis, unspecified: Secondary | ICD-10-CM

## 2021-07-21 MED ORDER — SEMAGLUTIDE (2 MG/DOSE) 8 MG/3ML ~~LOC~~ SOPN: 2.0000 mg | PEN_INJECTOR | SUBCUTANEOUS | 0 refills | Status: DC

## 2021-07-21 NOTE — Addendum Note (Signed)
Addended by: Patrcia Dolly on: 07/21/2021 10:50 AM ? ? Modules accepted: Orders ? ?

## 2021-08-18 ENCOUNTER — Encounter: Payer: Self-pay | Admitting: Registered Nurse

## 2021-08-18 ENCOUNTER — Ambulatory Visit (INDEPENDENT_AMBULATORY_CARE_PROVIDER_SITE_OTHER): Payer: 59 | Admitting: Registered Nurse

## 2021-08-18 ENCOUNTER — Other Ambulatory Visit: Payer: Self-pay

## 2021-08-18 VITALS — BP 132/88 | HR 71 | Temp 97.6°F | Resp 18 | Ht 64.0 in | Wt 239.0 lb

## 2021-08-18 DIAGNOSIS — E039 Hypothyroidism, unspecified: Secondary | ICD-10-CM

## 2021-08-18 DIAGNOSIS — E785 Hyperlipidemia, unspecified: Secondary | ICD-10-CM

## 2021-08-18 DIAGNOSIS — Z0000 Encounter for general adult medical examination without abnormal findings: Secondary | ICD-10-CM

## 2021-08-18 DIAGNOSIS — R1010 Upper abdominal pain, unspecified: Secondary | ICD-10-CM | POA: Insufficient documentation

## 2021-08-18 DIAGNOSIS — L299 Pruritus, unspecified: Secondary | ICD-10-CM | POA: Diagnosis not present

## 2021-08-18 DIAGNOSIS — I10 Essential (primary) hypertension: Secondary | ICD-10-CM

## 2021-08-18 LAB — COMPREHENSIVE METABOLIC PANEL
ALT: 17 U/L (ref 0–35)
AST: 19 U/L (ref 0–37)
Albumin: 4.7 g/dL (ref 3.5–5.2)
Alkaline Phosphatase: 40 U/L (ref 39–117)
BUN: 13 mg/dL (ref 6–23)
CO2: 24 mEq/L (ref 19–32)
Calcium: 10 mg/dL (ref 8.4–10.5)
Chloride: 102 mEq/L (ref 96–112)
Creatinine, Ser: 0.71 mg/dL (ref 0.40–1.20)
GFR: 98.78 mL/min (ref >60.00)
Glucose, Bld: 81 mg/dL (ref 70–99)
Potassium: 4.1 mEq/L (ref 3.5–5.1)
Sodium: 138 mEq/L (ref 135–145)
Total Bilirubin: 0.7 mg/dL (ref 0.2–1.2)
Total Protein: 7.2 g/dL (ref 6.0–8.3)

## 2021-08-18 LAB — URINALYSIS, ROUTINE W REFLEX MICROSCOPIC
Bilirubin Urine: NEGATIVE
Hgb urine dipstick: NEGATIVE
Ketones, ur: NEGATIVE
Leukocytes,Ua: NEGATIVE
Nitrite: NEGATIVE
Specific Gravity, Urine: 1.02 (ref 1.000–1.030)
Total Protein, Urine: NEGATIVE
Urine Glucose: NEGATIVE
Urobilinogen, UA: 1 (ref 0.0–1.0)
pH: 6 (ref 5.0–8.0)

## 2021-08-18 LAB — CBC WITH DIFFERENTIAL/PLATELET
Basophils Absolute: 0.1 10*3/uL (ref 0.0–0.1)
Basophils Relative: 1.2 % (ref 0.0–3.0)
Eosinophils Absolute: 0.2 10*3/uL (ref 0.0–0.7)
Eosinophils Relative: 2.6 % (ref 0.0–5.0)
HCT: 38.1 % (ref 36.0–46.0)
Hemoglobin: 13.1 g/dL (ref 12.0–15.0)
Lymphocytes Relative: 34.7 % (ref 12.0–46.0)
Lymphs Abs: 2.5 10*3/uL (ref 0.7–4.0)
MCHC: 34.3 g/dL (ref 30.0–36.0)
MCV: 92.2 fl (ref 78.0–100.0)
Monocytes Absolute: 0.6 10*3/uL (ref 0.1–1.0)
Monocytes Relative: 8 % (ref 3.0–12.0)
Neutro Abs: 3.8 10*3/uL (ref 1.4–7.7)
Neutrophils Relative %: 53.5 % (ref 43.0–77.0)
Platelets: 296 10*3/uL (ref 150.0–400.0)
RBC: 4.14 Mil/uL (ref 3.87–5.11)
RDW: 13.5 % (ref 11.5–15.5)
WBC: 7.1 10*3/uL (ref 4.0–10.5)

## 2021-08-18 LAB — LIPID PANEL
Cholesterol: 163 mg/dL (ref 0–200)
HDL: 51.1 mg/dL (ref >39.00)
LDL Cholesterol: 87 mg/dL (ref 0–99)
NonHDL: 111.75
Total CHOL/HDL Ratio: 3
Triglycerides: 125 mg/dL (ref 0.0–149.0)
VLDL: 25 mg/dL (ref 0.0–40.0)

## 2021-08-18 LAB — LIPASE: Lipase: 31 U/L (ref 11.0–59.0)

## 2021-08-18 LAB — HEMOGLOBIN A1C: Hgb A1c MFr Bld: 5.3 % (ref 4.6–6.5)

## 2021-08-18 LAB — AMYLASE: Amylase: 18 U/L — ABNORMAL LOW (ref 27–131)

## 2021-08-18 LAB — TSH: TSH: 0.84 u[IU]/mL (ref 0.35–5.50)

## 2021-08-18 MED ORDER — HYDROCORTISONE-ACETIC ACID 1-2 % OT SOLN: 3.0000 [drp] | Freq: Two times a day (BID) | OTIC | 0 refills | Status: DC

## 2021-08-18 NOTE — Assessment & Plan Note (Signed)
Clinically stable. Labs collected. Will follow up with the patient as warranted.

## 2021-08-18 NOTE — Progress Notes (Signed)
Complete physical exam  Patient: Debbie [name]   DOB: [birth year]   51 y.o. Female  MRN: [id number] Visit Date: 08/18/2021  Subjective:    Chief Complaint  Patient presents with   Annual Exam    Patient states she is here for her CPE    Debbie [name] is a 51 y.o. female who presents today for a complete physical exam. She reports consuming a general diet.     She generally feels well. She reports sleeping well. She does have additional problems to discuss today.   Grief Lost her father recently Some trouble sleeping Does not use sleep aids.  Acknowledges this will take time Discussed seroquel '50mg'$  po qhs prn, she declines   Weight On semaglutide. Notes some stomach upset with fatty/fried foods. Otherwise no AE.  Had been down to 230 lbs before losing her father.  Has made great progress with continued efforts with diet and exercise - has cut out all sugary beverages and most junk food.   Vision:Within the last year Dental:Within Last 6 months STD Screen:No PSA:No Most recent fall risk assessment:    08/18/2021    8:05 AM  [place] in the past year? 0  Number falls in past yr: 0  Injury with Fall? 0  Risk for fall due to : No Fall Risks  Follow up Falls evaluation completed     Most recent depression screenings:    08/18/2021    8:05 AM 05/20/2021    9:24 AM  PHQ 2/9 Scores  PHQ - 2 Score 0 0  PHQ- 9 Score 1 1     Patient Active Problem List   Diagnosis Date Noted   Upper abdominal pain 08/18/2021   Itching of ear 08/18/2021   Visit for routine gyn exam 05/20/2021   Screening mammogram for breast cancer 05/20/2021   History of endometrial ablation 05/20/2021   IGT (impaired glucose tolerance) 04/03/2019   Hypertension 02/20/2019   Morbid obesity ([place]) 02/08/2019   Hypothyroidism    Hyperlipidemia    Depression    Seasonal allergies    Past Medical History:  Diagnosis Date   Acute renal failure (ARF) ([place]) 10/01/2015   Allergy    Depression     Elevated cholesterol    Hyperlipidemia    Hypertension    Hypothyroidism    Morbid obesity ([place]) 02/08/2019   Seasonal allergies    Sleep apnea    wears cpap    Thyroid disease    Past Surgical History:  Procedure Laterality Date   ABLATION  10/2019   ADENOIDECTOMY     COLONOSCOPY     DENTAL SURGERY     graft of mouth    Social History   Tobacco Use   Smoking status: Never   Smokeless tobacco: Never  Vaping Use   Vaping Use: Never used  Substance Use Topics   Alcohol use: No   Drug use: No   Social History   Socioeconomic History   Marital status: Married    Spouse name: Not on file   Number of children: 0   Years of education: Not on file   Highest education level: Not on file  Occupational History   Not on file  Tobacco Use   Smoking status: Never   Smokeless tobacco: Never  Vaping Use   Vaping Use: Never used  Substance and Sexual Activity   Alcohol use: No   Drug use: No   Sexual activity: Not on  file  Other Topics Concern   Not on file  Social History Narrative   Not on file   Social Determinants of Health   Financial Resource Strain: Not on file  Food Insecurity: No Food Insecurity   Worried About Running Out of Food in the Last Year: Never true   Ran Out of Food in the Last Year: Never true  Transportation Needs: No Transportation Needs   Lack of Transportation (Medical): No   Lack of Transportation (Non-Medical): No  Physical Activity: Not on file  Stress: Not on file  Social Connections: Not on file  Intimate Partner Violence: Not on file   Family Status  Relation Name Status   Father  Alive   Mother  (Not Specified)   MGM  (Not Specified)   MGF  (Not Specified)   Neg Hx  (Not Specified)   Family History  Problem Relation Age of Onset   Heart disease Father    Hypertension Mother    Hyperlipidemia Mother    Breast cancer Maternal Grandmother    Prostate cancer Maternal Grandfather    Diabetes Maternal Grandfather    CAD  Maternal Grandfather    Colon cancer Neg Hx    Colon polyps Neg Hx    Esophageal cancer Neg Hx    Stomach cancer Neg Hx    Rectal cancer Neg Hx    Allergies  Allergen Reactions   Atorvastatin Hives     Patient Care Team: Maximiano Coss, NP as PCP - General (Adult Health Nurse Practitioner) Nahser, Wonda Cheng, MD as PCP - Cardiology (Cardiology)   Medications: Outpatient Medications Prior to Visit  Medication Sig   Ascorbic Acid (VITAMIN C) 1000 MG tablet Take 1,000 mg by mouth daily.   Azelastine-Fluticasone 137-50 MCG/ACT SUSP PLACE 1 SPRAY INTO THE NOSE EVERY 12 (TWELVE) HOURS.   BIOTIN PO Take 1 tablet by mouth daily.   cetirizine (ZYRTEC) 10 MG tablet Take 10 mg by mouth daily.   Cholecalciferol (D3-1000 PO) Take 1,000 Units by mouth daily.   dicyclomine (BENTYL) 20 MG tablet Take 1 tablet (20 mg total) by mouth 3 (three) times daily as needed for spasms.   escitalopram (LEXAPRO) 20 MG tablet Take 1 tablet (20 mg total) by mouth daily.   fenofibrate (TRICOR) 145 MG tablet Take 1 tablet (145 mg total) by mouth daily.   levothyroxine (SYNTHROID) 100 MCG tablet TAKE 1 TABLET BY MOUTH EVERY DAY IN THE MORNING (Patient taking differently: Take 100 mcg by mouth daily.)   montelukast (SINGULAIR) 10 MG tablet Take 1 tablet by mouth every evening.   Multiple Vitamins-Minerals (HAIR/SKIN/NAILS) CAPS Take 1 capsule by mouth daily.   nystatin (MYCOSTATIN/NYSTOP) powder Apply 1 application topically 3 (three) times daily.   ondansetron (ZOFRAN-ODT) 4 MG disintegrating tablet '4mg'$  ODT q4 hours prn nausea/vomit   rosuvastatin (CRESTOR) 20 MG tablet TAKE 1 TABLET BY MOUTH EVERYDAY AT BEDTIME (Patient taking differently: Take 20 mg by mouth daily.)   Semaglutide, 2 MG/DOSE, 8 MG/3ML SOPN Inject 2 mg as directed once a week.   triamcinolone cream (KENALOG) 0.1 % APPLY TO AFFECTED AREA TWICE A DAY   vitamin B-12 (CYANOCOBALAMIN) 500 MCG tablet Take 500 mcg by mouth daily.   zinc gluconate 50 MG  tablet Take 50 mg by mouth daily.   [DISCONTINUED] ketoconazole (NIZORAL) 2 % cream Apply 1 application topically daily. (Patient not taking: Reported on 08/18/2021)   [DISCONTINUED] OZEMPIC, 1 MG/DOSE, 4 MG/3ML SOPN INJECT 1 MG ONCE A WEEK AS DIRECTED  No facility-administered medications prior to visit.    Review of Systems  Constitutional: Negative.   HENT: Negative.    Eyes: Negative.   Respiratory: Negative.    Cardiovascular: Negative.   Gastrointestinal: Negative.   Genitourinary: Negative.   Musculoskeletal: Negative.   Skin: Negative.   Neurological: Negative.   Psychiatric/Behavioral: Negative.    All other systems reviewed and are negative.  Last CBC Lab Results  Component Value Date   WBC 9.1 06/11/2021   HGB 14.1 06/11/2021   HCT 41.1 06/11/2021   MCV 91.1 06/11/2021   MCH 31.3 06/11/2021   RDW 12.7 06/11/2021   PLT 340 62/95/2841   Last metabolic panel Lab Results  Component Value Date   GLUCOSE 100 (H) 06/11/2021   NA 138 06/11/2021   K 4.2 06/11/2021   CL 106 06/11/2021   CO2 22 06/11/2021   BUN 8 06/11/2021   CREATININE 0.98 06/11/2021   GFRNONAA >60 06/11/2021   CALCIUM 9.9 06/11/2021   PROT 7.5 06/11/2021   ALBUMIN 4.6 06/11/2021   BILITOT 1.7 (H) 06/11/2021   ALKPHOS 44 06/11/2021   AST 41 06/11/2021   ALT 30 06/11/2021   ANIONGAP 10 06/11/2021   Last lipids Lab Results  Component Value Date   CHOL 170 12/29/2020   HDL 44.10 12/29/2020   LDLCALC 93 12/29/2020   TRIG 163.0 (H) 12/29/2020   CHOLHDL 4 12/29/2020   Last hemoglobin A1c Lab Results  Component Value Date   HGBA1C 5.9 12/29/2020   Last thyroid functions Lab Results  Component Value Date   TSH 1.05 12/29/2020   Last vitamin D Lab Results  Component Value Date   VD25OH 45.72 08/04/2020   Last vitamin B12 and Folate Lab Results  Component Value Date   VITAMINB12 922 (H) 08/04/2020        Objective:     BP 132/88   Pulse 71   Temp 97.6 F (36.4 C)  (Temporal)   Resp 18   Ht '5\' 4"'$  (1.626 m)   Wt 239 lb (108.4 kg)   SpO2 98%   BMI 41.02 kg/m   BP Readings from Last 3 Encounters:  08/18/21 132/88  06/11/21 (!) 167/99  05/20/21 (!) 156/106   Wt Readings from Last 3 Encounters:  08/18/21 239 lb (108.4 kg)  06/11/21 234 lb (106.1 kg)  05/20/21 245 lb 12.8 oz (111.5 kg)   SpO2 Readings from Last 3 Encounters:  08/18/21 98%  06/11/21 100%  03/24/21 99%      Physical Exam Vitals and nursing note reviewed.  Constitutional:      General: She is not in acute distress.    Appearance: Normal appearance. She is not ill-appearing, toxic-appearing or diaphoretic.  HENT:     Head: Normocephalic and atraumatic.     Right Ear: Tympanic membrane and external ear normal. There is no impacted cerumen.     Left Ear: Tympanic membrane and external ear normal. There is no impacted cerumen.     Ears:     Comments: Canals show some mild erythema. Suspect mild dermatitis    Nose: Nose normal. No congestion or rhinorrhea.     Mouth/Throat:     Mouth: Mucous membranes are moist.     Pharynx: Oropharynx is clear. No oropharyngeal exudate or posterior oropharyngeal erythema.  Eyes:     General: No scleral icterus.       Right eye: No discharge.        Left eye: No discharge.  Extraocular Movements: Extraocular movements intact.     Conjunctiva/sclera: Conjunctivae normal.     Pupils: Pupils are equal, round, and reactive to light.  Neck:     Vascular: No carotid bruit.  Cardiovascular:     Rate and Rhythm: Normal rate and regular rhythm.     Pulses: Normal pulses.     Heart sounds: Normal heart sounds. No murmur heard.   No friction rub. No gallop.  Pulmonary:     Effort: Pulmonary effort is normal. No respiratory distress.     Breath sounds: Normal breath sounds. No stridor. No wheezing, rhonchi or rales.  Chest:     Chest wall: No tenderness.  Abdominal:     General: Abdomen is flat. Bowel sounds are normal. There is no  distension.     Palpations: There is no mass.     Tenderness: There is no abdominal tenderness. There is no right CVA tenderness, left CVA tenderness, guarding or rebound.     Hernia: No hernia is present.  Musculoskeletal:        General: No swelling, tenderness, deformity or signs of injury. Normal range of motion.     Cervical back: Normal range of motion and neck supple. No rigidity or tenderness.     Right lower leg: No edema.     Left lower leg: No edema.  Lymphadenopathy:     Cervical: No cervical adenopathy.  Skin:    General: Skin is warm and dry.     Capillary Refill: Capillary refill takes less than 2 seconds.     Coloration: Skin is not jaundiced or pale.     Findings: No bruising, erythema, lesion or rash.  Neurological:     General: No focal deficit present.     Mental Status: She is alert and oriented to person, place, and time. Mental status is at baseline.     Cranial Nerves: No cranial nerve deficit.     Sensory: No sensory deficit.     Motor: No weakness.     Coordination: Coordination normal.     Gait: Gait normal.     Deep Tendon Reflexes: Reflexes normal.  Psychiatric:        Mood and Affect: Mood normal.        Behavior: Behavior normal.        Thought Content: Thought content normal.        Judgment: Judgment normal.     No results found for any visits on 08/18/21.    Assessment & Plan:    Routine Health Maintenance and Physical Exam  Immunization History  Administered Date(s) Administered   Influenza, High Dose Seasonal PF 05/26/2016, 01/02/2019, 02/19/2020   Influenza,inj,Quad PF,6+ Mos 12/18/2018, 12/27/2019   Influenza-Unspecified 12/18/2018, 12/22/2018, 12/27/2019, 02/26/2021   PFIZER(Purple Top)SARS-COV-2 Vaccination 06/08/2019, 06/29/2019, 07/22/2019, 01/17/2020, 02/19/2020   Tdap 02/08/2019   Zoster Recombinat (Shingrix) 10/07/2020, 12/22/2020    Health Maintenance  Topic Date Due   COVID-19 Vaccine (5 - Booster for Pfizer series)  04/15/2020   Hepatitis C Screening  12/08/2021 (Originally 09/09/1988)   HIV Screening  12/08/2021 (Originally 09/09/1985)   INFLUENZA VACCINE  10/19/2021   MAMMOGRAM  07/16/2023   PAP SMEAR-Modifier  05/20/2024   COLONOSCOPY (Pts 45-53yr Insurance coverage will need to be confirmed)  11/18/2025   TETANUS/TDAP  02/07/2029   Zoster Vaccines- Shingrix  Completed   HPV VACCINES  Aged Out    Discussed health benefits of physical activity, and encouraged her to engage in regular exercise appropriate for her  age and condition.  Problem List Items Addressed This Visit       Cardiovascular and Mediastinum   Hypertension    Well controlled. Continue regimen. Labs collected. Will follow up with the patient as warranted.        Relevant Orders   CBC with Differential/Platelet   Comprehensive metabolic panel   Hemoglobin A1c     Endocrine   Hypothyroidism    Clinically stable. Labs collected. Will follow up with the patient as warranted.        Relevant Orders   Hemoglobin A1c   TSH     Other   Hyperlipidemia    Labs collected. Will follow up with the patient as warranted.        Relevant Orders   Hemoglobin A1c   Lipid panel   Upper abdominal pain    Effects from semaglutide vs. Gallbladder concern vs. GERD. Labs collected. Will follow up with the patient as warranted. Discussed diet modification for avoiding symptoms.        Relevant Orders   Comprehensive metabolic panel   Amylase   Lipase   Urinalysis, Routine w reflex microscopic   Itching of ear    vosol-hc given. If not covered, pt advised to use scant amount of hydrocortisone (OTC) in ear sparingly.        Relevant Medications   acetic acid-hydrocortisone (VOSOL-HC) OTIC solution   Other Visit Diagnoses     Annual physical exam    -  Primary      Return in about 6 months (around 02/17/2022) for Semaglutide - follow up .     PLAN Exam unremarkable unless otherwise noted above See problem  based charting Labs collected. Will follow up with the patient as warranted. Patient encouraged to call clinic with any questions, comments, or concerns.   Maximiano Coss, NP

## 2021-08-18 NOTE — Assessment & Plan Note (Signed)
vosol-hc given. If not covered, pt advised to use scant amount of hydrocortisone (OTC) in ear sparingly.

## 2021-08-18 NOTE — Assessment & Plan Note (Signed)
Effects from semaglutide vs. Gallbladder concern vs. GERD. Labs collected. Will follow up with the patient as warranted. Discussed diet modification for avoiding symptoms.

## 2021-08-18 NOTE — Patient Instructions (Addendum)
Debbie [name] -   Always great to see you  Call with any concerns  See you in 6 mo, sooner if you need anything  Thanks,  Rich    If you have lab work done today you will be contacted with your lab results within the next 2 weeks.  If you have not heard from [place] then please contact us. The fastest way to get your results is to register for My Chart.   IF you received an x-ray today, you will receive an invoice from [facility]. Please contact [facility] at [phone] with questions or concerns regarding your invoice.   IF you received labwork today, you will receive an invoice from [facility]. Please contact [facility] at [phone] with questions or concerns regarding your invoice.   Our billing staff will not be able to assist you with questions regarding bills from these companies.  You will be contacted with the lab results as soon as they are available. The fastest way to get your results is to activate your My Chart account. Instructions are located on the last page of this paperwork. If you have not heard from [place] regarding the results in 2 weeks, please contact this office.

## 2021-08-18 NOTE — Assessment & Plan Note (Signed)
Well controlled. Continue regimen. Labs collected. Will follow up with the patient as warranted.

## 2021-08-18 NOTE — Assessment & Plan Note (Signed)
Labs collected. Will follow up with the patient as warranted.  

## 2021-08-30 ENCOUNTER — Encounter: Payer: Self-pay | Admitting: Registered Nurse

## 2021-08-30 NOTE — Telephone Encounter (Signed)
Is there an alternative to drops prescribed on 08/18/21

## 2021-08-31 MED ORDER — NEOMYCIN-POLYMYXIN-HC 1 % OT SOLN
1.0000 [drp] | Freq: Three times a day (TID) | OTIC | 0 refills | Status: DC | PRN
Start: 2021-08-31 — End: 2023-03-01

## 2021-10-15 ENCOUNTER — Other Ambulatory Visit: Payer: Self-pay | Admitting: Registered Nurse

## 2021-11-08 ENCOUNTER — Ambulatory Visit: Payer: 59 | Admitting: Internal Medicine

## 2021-11-08 ENCOUNTER — Encounter: Payer: Self-pay | Admitting: Internal Medicine

## 2021-11-08 DIAGNOSIS — F5102 Adjustment insomnia: Secondary | ICD-10-CM | POA: Diagnosis not present

## 2021-11-08 DIAGNOSIS — I10 Essential (primary) hypertension: Secondary | ICD-10-CM | POA: Diagnosis not present

## 2021-11-08 DIAGNOSIS — G4700 Insomnia, unspecified: Secondary | ICD-10-CM

## 2021-11-08 HISTORY — DX: Insomnia, unspecified: G47.00

## 2021-11-08 MED ORDER — TOPIRAMATE 25 MG PO TABS: 25.0000 mg | ORAL_TABLET | Freq: Two times a day (BID) | ORAL | 3 refills | Status: DC

## 2021-11-08 NOTE — Assessment & Plan Note (Signed)
Father/neighbor committed suicide 2023 and this has contributed Just cant get to sleep Stable Complicated by cpap adherence. Worries about her widowed mother. Doesn't want meds, but I offered. Also complicated by allergies Rest of sleep hygiene good Encouraged exercise Discussed mindfulness, gratitude exercises and category game methods of getting mind off worrying though.

## 2021-11-08 NOTE — Assessment & Plan Note (Signed)
BP Readings from Last 3 Encounters:  11/08/21 (!) 150/100  08/18/21 132/88  06/11/21 (!) 167/99  goal 140/90 Stable Encouraged resume blood pressure meds- they caused her to feel bad in past She would prefer to hold off on meds Advise bp monitoring, and let me know if bp not at goal 140/90 Encouraged DASH diet and weight loss.

## 2021-11-08 NOTE — Assessment & Plan Note (Signed)
Continue ozempic Morbidly obese based  Wt Readings from Last 3 Encounters:  11/08/21 236 lb 6.4 oz (107.2 kg)  08/18/21 239 lb (108.4 kg)  06/11/21 234 lb (106.1 kg)  continue ozempic 2 mg weeekly Add exercise- she plans to walk dog. Encouraging resistance training due to low metabolic rate Will add topiramate

## 2021-11-08 NOTE — Patient Instructions (Addendum)
It was a pleasure seeing you today!  Today the plan is...  We will try adding topiramate.  Adjustment insomnia Assessment & Plan: Father/neighbor committed suicide 2023 and this has contributed Just cant get to sleep Stable Complicated by cpap adherence. Worries about her widowed mother. Doesn't want meds, but I offered. Also complicated by allergies Rest of sleep hygiene good Encouraged exercise Discussed mindfulness, gratitude exercises and category game methods of getting mind off worrying though.    Primary hypertension Assessment & Plan: BP Readings from Last 3 Encounters:  11/08/21 (!) 150/100  08/18/21 132/88  06/11/21 (!) 167/99  goal 140/90 Stable Encouraged resume blood pressure meds- they caused her to feel bad in past She would prefer to hold off on meds Advise bp monitoring, and let me know if bp not at goal 140/90 Encouraged DASH diet and weight loss.   Morbid obesity ([place]) Assessment & Plan: Continue ozempic Morbidly obese based  Wt Readings from Last 3 Encounters:  11/08/21 236 lb 6.4 oz (107.2 kg)  08/18/21 239 lb (108.4 kg)  06/11/21 234 lb (106.1 kg)  continue ozempic 2 mg weeekly Add exercise- she plans to walk dog. Encouraging resistance training due to low metabolic rate Will add topiramate  Orders: -     Topiramate; Take 1 tablet (25 mg total) by mouth 2 (two) times daily. Take just one tablet daily for the first week.  Dispense: 180 tablet; Refill: 3      Loralee Pacas, MD   No follow-ups on file.    - Please bring all your medicines to your next appointment. This is the best way for me to know exactly what you're taking.  - If your condition begins to worsen or become severe:  go to the ER. - If your condition fails to resolve or you have other questions / concerns: please contact me via phone [phone] or MyChart messaging.      DASH Eating Plan DASH stands for Dietary Approaches to Stop Hypertension. The DASH eating  plan is a healthy eating plan that has been shown to: Reduce high blood pressure (hypertension). Reduce your risk for type 2 diabetes, heart disease, and stroke. Help with weight loss. What are tips for following this plan? Reading food labels Check food labels for the amount of salt (sodium) per serving. Choose foods with less than 5 percent of the Daily Value of sodium. Generally, foods with less than 300 milligrams (mg) of sodium per serving fit into this eating plan. To find whole grains, look for the word "whole" as the first word in the ingredient list. Shopping Buy products labeled as "low-sodium" or "no salt added." Buy fresh foods. Avoid canned foods and pre-made or frozen meals. Cooking Avoid adding salt when cooking. Use salt-free seasonings or herbs instead of table salt or sea salt. Check with your health care provider or pharmacist before using salt substitutes. Do not fry foods. Cook foods using healthy methods such as baking, boiling, grilling, roasting, and broiling instead. Cook with heart-healthy oils, such as olive, canola, avocado, soybean, or sunflower oil. Meal planning  Eat a balanced diet that includes: 4 or more servings of fruits and 4 or more servings of vegetables each day. Try to fill one-half of your plate with fruits and vegetables. 6-8 servings of whole grains each day. Less than 6 oz (170 g) of lean meat, poultry, or fish each day. A 3-oz (85-g) serving of meat is about the same size as a deck of cards.  One egg equals 1 oz (28 g). 2-3 servings of low-fat dairy each day. One serving is 1 cup (237 mL). 1 serving of nuts, seeds, or beans 5 times each week. 2-3 servings of heart-healthy fats. Healthy fats called omega-3 fatty acids are found in foods such as walnuts, flaxseeds, fortified milks, and eggs. These fats are also found in cold-water fish, such as sardines, salmon, and mackerel. Limit how much you eat of: Canned or prepackaged foods. Food that is high  in trans fat, such as some fried foods. Food that is high in saturated fat, such as fatty meat. Desserts and other sweets, sugary drinks, and other foods with added sugar. Full-fat dairy products. Do not salt foods before eating. Do not eat more than 4 egg yolks a week. Try to eat at least 2 vegetarian meals a week. Eat more home-cooked food and less restaurant, buffet, and fast food. Lifestyle When eating at a restaurant, ask that your food be prepared with less salt or no salt, if possible. If you drink alcohol: Limit how much you use to: 0-1 drink a day for women who are not pregnant. 0-2 drinks a day for men. Be aware of how much alcohol is in your drink. In the [place] one drink equals one 12 oz bottle of beer (355 mL), one 5 oz glass of wine (148 mL), or one 1 oz glass of hard liquor (44 mL). General information Avoid eating more than 2,300 mg of salt a day. If you have hypertension, you may need to reduce your sodium intake to 1,500 mg a day. Work with your health care provider to maintain a healthy body weight or to lose weight. Ask what an ideal weight is for you. Get at least 30 minutes of exercise that causes your heart to beat faster (aerobic exercise) most days of the week. Activities may include walking, swimming, or biking. Work with your health care provider or dietitian to adjust your eating plan to your individual calorie needs. What foods should I eat? Fruits All fresh, dried, or frozen fruit. Canned fruit in natural juice (without added sugar). Vegetables Fresh or frozen vegetables (raw, steamed, roasted, or grilled). Low-sodium or reduced-sodium tomato and vegetable juice. Low-sodium or reduced-sodium tomato sauce and tomato paste. Low-sodium or reduced-sodium canned vegetables. Grains Whole-grain or whole-wheat bread. Whole-grain or whole-wheat pasta. Brown rice. [place]. Bulgur. Whole-grain and low-sodium cereals. Pita bread. Low-fat, low-sodium crackers.  Whole-wheat flour tortillas. Meats and other proteins Skinless chicken or [place]. Ground chicken or [place]. Pork with fat trimmed off. Fish and seafood. Egg whites. Dried beans, peas, or lentils. Unsalted nuts, nut butters, and seeds. Unsalted canned beans. Lean cuts of beef with fat trimmed off. Low-sodium, lean precooked or cured meat, such as sausages or meat loaves. Dairy Low-fat (1%) or fat-free (skim) milk. Reduced-fat, low-fat, or fat-free cheeses. Nonfat, low-sodium ricotta or cottage cheese. Low-fat or nonfat yogurt. Low-fat, low-sodium cheese. Fats and oils Soft margarine without trans fats. Vegetable oil. Reduced-fat, low-fat, or light mayonnaise and salad dressings (reduced-sodium). Canola, safflower, olive, avocado, soybean, and sunflower oils. Avocado. Seasonings and condiments Herbs. Spices. Seasoning mixes without salt. Other foods Unsalted popcorn and pretzels. Fat-free sweets. The items listed above may not be a complete list of foods and beverages you can eat. Contact a dietitian for more information. What foods should I avoid? Fruits Canned fruit in a light or heavy syrup. Fried fruit. Fruit in cream or butter sauce. Vegetables Creamed or fried vegetables. Vegetables in a cheese sauce.  Regular canned vegetables (not low-sodium or reduced-sodium). Regular canned tomato sauce and paste (not low-sodium or reduced-sodium). Regular tomato and vegetable juice (not low-sodium or reduced-sodium). Angie Fava. Olives. Grains Baked goods made with fat, such as croissants, muffins, or some breads. Dry pasta or rice meal packs. Meats and other proteins Fatty cuts of meat. Ribs. Fried meat. Berniece Salines. Bologna, salami, and other precooked or cured meats, such as sausages or meat loaves. Fat from the back of a pig (fatback). Bratwurst. Salted nuts and seeds. Canned beans with added salt. Canned or smoked fish. Whole eggs or egg yolks. Chicken or [place] with skin. Dairy Whole or 2% milk, cream, and  half-and-half. Whole or full-fat cream cheese. Whole-fat or sweetened yogurt. Full-fat cheese. Nondairy creamers. Whipped toppings. Processed cheese and cheese spreads. Fats and oils Butter. Stick margarine. Lard. Shortening. Ghee. Bacon fat. Tropical oils, such as coconut, palm kernel, or palm oil. Seasonings and condiments Onion salt, garlic salt, seasoned salt, table salt, and sea salt. Worcestershire sauce. Tartar sauce. Barbecue sauce. Teriyaki sauce. Soy sauce, including reduced-sodium. Steak sauce. Canned and packaged gravies. Fish sauce. Oyster sauce. Cocktail sauce. Store-bought horseradish. Ketchup. Mustard. Meat flavorings and tenderizers. Bouillon cubes. Hot sauces. Pre-made or packaged marinades. Pre-made or packaged taco seasonings. Relishes. Regular salad dressings. Other foods Salted popcorn and pretzels. The items listed above may not be a complete list of foods and beverages you should avoid. Contact a dietitian for more information. Where to find more information National Heart, Lung, and Blood Institute: https://wilson-eaton.com/ American Heart Association: www.heart.org Academy of Nutrition and Dietetics: www.eatright.[place]: www.kidney.org Summary The DASH eating plan is a healthy eating plan that has been shown to reduce high blood pressure (hypertension). It may also reduce your risk for type 2 diabetes, heart disease, and stroke. When on the DASH eating plan, aim to eat more fresh fruits and vegetables, whole grains, lean proteins, low-fat dairy, and heart-healthy fats. With the DASH eating plan, you should limit salt (sodium) intake to 2,300 mg a day. If you have hypertension, you may need to reduce your sodium intake to 1,500 mg a day. Work with your health care provider or dietitian to adjust your eating plan to your individual calorie needs. This information is not intended to replace advice given to you by your health care provider. Make sure you  discuss any questions you have with your health care provider. Document Revised: 02/08/2019 Document Reviewed: 02/08/2019 Elsevier Patient Education  [place].

## 2021-11-08 NOTE — Progress Notes (Signed)
   Debbie [name] is a 51 y.o. female who presents today for an office visit.  Assessment/Plan:  Overview:    Debbie [name] was seen today for transfer of care.  Adjustment insomnia Assessment & Plan: Father/neighbor committed suicide 2023 and this has contributed Just cant get to sleep Stable Complicated by cpap adherence. Worries about her widowed mother. Doesn't want meds, but I offered. Also complicated by allergies Rest of sleep hygiene good Encouraged exercise Discussed mindfulness, gratitude exercises and category game methods of getting mind off worrying though.    Primary hypertension Assessment & Plan: BP Readings from Last 3 Encounters:  11/08/21 (!) 150/100  08/18/21 132/88  06/11/21 (!) 167/99  goal 140/90 Stable Encouraged resume blood pressure meds- they caused her to feel bad in past She would prefer to hold off on meds Advise bp monitoring, and let me know if bp not at goal 140/90 Encouraged DASH diet and weight loss.   Morbid obesity ([place]) Assessment & Plan: Continue ozempic Morbidly obese based  Wt Readings from Last 3 Encounters:  11/08/21 236 lb 6.4 oz (107.2 kg)  08/18/21 239 lb (108.4 kg)  06/11/21 234 lb (106.1 kg)  continue ozempic 2 mg weeekly Add exercise- she plans to walk dog. Encouraging resistance training due to low metabolic rate Will add topiramate  Orders: -     Topiramate; Take 1 tablet (25 mg total) by mouth 2 (two) times daily. Take just one tablet daily for the first week.  Dispense: 180 tablet; Refill: 3       No follow-ups on file.     Subjective:  HPI:  History taking was used to update the overview section of each addressed problem in the assessment/plan section above.       Objective:  Physical Exam: BP (!) 150/100 (BP Location: Left Arm)   Pulse 72   Temp 98.2 F (36.8 C) (Temporal)   Ht '5\' 4"'$  (1.626 m)   Wt 236 lb 6.4 oz (107.2 kg)   SpO2 96%   BMI 40.58 kg/m    Gen: No acute distress, resting  comfortably Psych: Normal affect and thought content  Problem specific physical exam findings:  Overweight, very kind and polite.   No results found for any visits on 11/08/21.        Debbie Pacas, MD 11/08/2021 9:55 AM

## 2021-11-17 ENCOUNTER — Encounter: Payer: Self-pay | Admitting: Internal Medicine

## 2021-11-24 ENCOUNTER — Other Ambulatory Visit: Payer: Self-pay | Admitting: Internal Medicine

## 2021-11-24 DIAGNOSIS — I10 Essential (primary) hypertension: Secondary | ICD-10-CM

## 2021-11-24 MED ORDER — LOSARTAN POTASSIUM-HCTZ 50-12.5 MG PO TABS: 1.0000 | ORAL_TABLET | Freq: Every day | ORAL | 3 refills | Status: DC

## 2021-11-24 NOTE — Progress Notes (Signed)
Patient reported high bps at home.  Sending losartan 50. Asked her to let me know if it work

## 2021-12-18 ENCOUNTER — Encounter: Payer: Self-pay | Admitting: Internal Medicine

## 2021-12-20 ENCOUNTER — Other Ambulatory Visit: Payer: Self-pay | Admitting: *Deleted

## 2021-12-20 DIAGNOSIS — F3342 Major depressive disorder, recurrent, in full remission: Secondary | ICD-10-CM

## 2021-12-20 DIAGNOSIS — E039 Hypothyroidism, unspecified: Secondary | ICD-10-CM

## 2021-12-20 DIAGNOSIS — E785 Hyperlipidemia, unspecified: Secondary | ICD-10-CM

## 2021-12-20 MED ORDER — FENOFIBRATE 145 MG PO TABS: 145.0000 mg | ORAL_TABLET | Freq: Every day | ORAL | 3 refills | Status: DC

## 2021-12-20 MED ORDER — ROSUVASTATIN CALCIUM 20 MG PO TABS: 20.0000 mg | ORAL_TABLET | Freq: Every day | ORAL | 3 refills | Status: DC

## 2021-12-20 MED ORDER — LEVOTHYROXINE SODIUM 100 MCG PO TABS: 100.0000 ug | ORAL_TABLET | Freq: Every day | ORAL | 3 refills | Status: DC

## 2021-12-20 MED ORDER — ESCITALOPRAM OXALATE 20 MG PO TABS: 20.0000 mg | ORAL_TABLET | Freq: Every day | ORAL | 3 refills | Status: DC

## 2022-02-06 ENCOUNTER — Encounter: Payer: Self-pay | Admitting: Internal Medicine

## 2022-02-08 ENCOUNTER — Ambulatory Visit: Payer: 59 | Admitting: Internal Medicine

## 2022-02-08 ENCOUNTER — Encounter: Payer: Self-pay | Admitting: Internal Medicine

## 2022-02-08 VITALS — BP 139/72 | Temp 97.9°F | Ht 64.0 in | Wt 239.4 lb

## 2022-02-08 DIAGNOSIS — Z23 Encounter for immunization: Secondary | ICD-10-CM | POA: Diagnosis not present

## 2022-02-08 DIAGNOSIS — R110 Nausea: Secondary | ICD-10-CM | POA: Diagnosis not present

## 2022-02-08 DIAGNOSIS — J328 Other chronic sinusitis: Secondary | ICD-10-CM

## 2022-02-08 DIAGNOSIS — L299 Pruritus, unspecified: Secondary | ICD-10-CM

## 2022-02-08 DIAGNOSIS — Z6841 Body Mass Index (BMI) 40.0 and over, adult: Secondary | ICD-10-CM

## 2022-02-08 DIAGNOSIS — E781 Pure hyperglyceridemia: Secondary | ICD-10-CM

## 2022-02-08 DIAGNOSIS — F5102 Adjustment insomnia: Secondary | ICD-10-CM | POA: Diagnosis not present

## 2022-02-08 DIAGNOSIS — I10 Essential (primary) hypertension: Secondary | ICD-10-CM

## 2022-02-08 HISTORY — DX: Pure hyperglyceridemia: E78.1

## 2022-02-08 MED ORDER — AMOXICILLIN-POT CLAVULANATE 875-125 MG PO TABS: 1.0000 | ORAL_TABLET | Freq: Two times a day (BID) | ORAL | 0 refills | Status: DC

## 2022-02-08 MED ORDER — OZEMPIC (2 MG/DOSE) 8 MG/3ML ~~LOC~~ SOPN: PEN_INJECTOR | SUBCUTANEOUS | 3 refills | Status: DC

## 2022-02-08 MED ORDER — ONDANSETRON 4 MG PO TBDP: 4.0000 mg | ORAL_TABLET | Freq: Three times a day (TID) | ORAL | 0 refills | Status: DC | PRN

## 2022-02-08 NOTE — Assessment & Plan Note (Signed)
Agreed to Augmentin over persistent symptoms and sensation of goop in sinus cavities with head tilt, but encouraged her to reserve til after exhausting allergy treatment

## 2022-02-08 NOTE — Assessment & Plan Note (Signed)
She reports that she used to be higher than the 177 max we have from 2021 but we do not have the records on that.  I did a chart review to look for any records for it could not find anything and so I recommend we just stop the fenofibrate and then recheck the cholesterol here in another 3 to 6 months and see if the triglycerides are reasonable.  If they do not go up over 500 I think the fenofibrate should be stopped in accordance with current recommendations and replaced with omega-3 fatty acid.  I also recommended that she go ahead and start taking an omega-3 fatty acid

## 2022-02-08 NOTE — Assessment & Plan Note (Signed)
Gratitude exercises have been immensely helpful

## 2022-02-08 NOTE — Progress Notes (Signed)
[facility] [employer]:  [phone]   Routine Medical Office Visit  Patient:  Debbie [name]      Age: 51 y.o.       Sex:  female  Date:   02/08/2022  PCP:    Loralee Pacas, MD    [facility] Provider: Loralee Pacas, MD  Assessment/Plan:   Zen was seen today for 3 month follow-up, medication refill, light headed and nasal congestion.  Morbid obesity with BMI of 40.0-44.9, adult (HCC) -     Ozempic (2 MG/DOSE); INJECT 2 MG AS DIRECTED ONCE A WEEK.  Dispense: 27 mL; Refill: 3  Need for influenza vaccination -     Flu Vaccine QUAD 66moIM (Fluarix, Fluzone & Alfiuria Quad PF)  Morbid obesity ([place] Overview: Wt Readings from Last 3 Encounters:  02/08/22 239 lb 6.4 oz (108.6 kg)  11/08/21 236 lb 6.4 oz (107.2 kg)  08/18/21 239 lb (108.4 kg)  On ozempic 2 but feels like clothes are doing much better.  Assessment & Plan: Chronic, stable, improving despite no weight loss Continue topiramate, resume ozempic @ 2 mg weekly I tried to refill this back for a year and offered that we will do more -call and see if there is any issues with the financial side of it j  I offered a nausea medicine zofran just in case she ever gets any when she is restarting because she has been out She is doing walking and steps- I encouraged make sure to does resistance as well   Nausea -     Ondansetron; Take 1 tablet (4 mg total) by mouth every 8 (eight) hours as needed for nausea or vomiting (for nausea from wegovy or other source).  Dispense: 20 tablet; Refill: 0  Adjustment insomnia Overview: She reports severe insomnia has now moved to fantastic sleep with just gratitude exercises so I am going to resolve this from the problem list for now  Assessment & Plan: Gratitude exercises have been immensely helpful   Itching of ear Overview: It comes and goes and she has some drops that she uses intermittently probably eczema of the external auditory  canal   Hypertriglyceridemia Overview: No components found for: "TG" Lab Results  Component Value Date/Time   TRIG 125.0 08/18/2021 08:53 AM   TRIG 163.0 (H) 12/29/2020 09:20 AM   TRIG 148.0 08/04/2020 08:24 AM   TRIG 177.0 (H) 04/03/2019 07:30 AM     Assessment & Plan: She reports that she used to be higher than the 177 max we have from 2021 but we do not have the records on that.  I did a chart review to look for any records for it could not find anything and so I recommend we just stop the fenofibrate and then recheck the cholesterol here in another 3 to 6 months and see if the triglycerides are reasonable.  If they do not go up over 500 I think the fenofibrate should be stopped in accordance with current recommendations and replaced with omega-3 fatty acid.  I also recommended that she go ahead and start taking an omega-3 fatty acid   Other chronic sinusitis Overview: On extensive allergy medications Still gets frequent infections   Assessment & Plan: Agreed to Augmentin over persistent symptoms and sensation of goop in sinus cavities with head tilt, but encouraged her to reserve til after exhausting allergy treatment   Orders: -     Amoxicillin-Pot Clavulanate; Take 1 tablet by mouth 2 (two) times daily.  Dispense: 20 tablet; Refill: 0  Primary hypertension Assessment & Plan: Chronic stable controlled- but borderline Considered reducing losartan-hctz but not enough weight loss     She complaining of thick fluid shifts with positional changes of heads- similar to prior bacterial sinus infections - I sent augmentin in case it persists.    Subjective:   Debbie [name] is a 51 y.o. female with past medical history including: Past Medical History:  Diagnosis Date   Acute renal failure (ARF) ([place]) 10/01/2015   Allergy    Depression    Elevated cholesterol    History of endometrial ablation 05/20/2021   2021   Hyperlipidemia    Hypertension    Hypertriglyceridemia  02/08/2022   No components found for: "TG" Lab Results Component Value Date/Time  TRIG 125.0 08/18/2021 08:53 AM  TRIG 163.0 (H) 12/29/2020 09:20 AM  TRIG 148.0 08/04/2020 08:24 AM  TRIG 177.0 (H) 04/03/2019 07:30 AM     Hypothyroidism    Insomnia 11/08/2021   Morbid obesity ([place]) 02/08/2019   Seasonal allergies    Sleep apnea    wears cpap    Thyroid disease      She is presenting today with: Chief Complaint  Patient presents with   3 month follow-up    Has gained about three pounds since last visit, sleeping much better and has been taking BP medication.   Medication Refill    Ozempic, is completely out and hasn't had it in two weeks.   Light headed   Nasal Congestion    Thinks she may have a sinus infection.     Problem focused charting was used to record today's medical interview as follows: Problem  Hypertriglyceridemia   No components found for: "TG" Lab Results  Component Value Date/Time   TRIG 125.0 08/18/2021 08:53 AM   TRIG 163.0 (H) 12/29/2020 09:20 AM   TRIG 148.0 08/04/2020 08:24 AM   TRIG 177.0 (H) 04/03/2019 07:30 AM     Other Chronic Sinusitis   On extensive allergy medications Still gets frequent infections    Itching of Ear   It comes and goes and she has some drops that she uses intermittently probably eczema of the external auditory canal   Hypertension  Morbid Obesity (Hcc)   Wt Readings from Last 3 Encounters:  02/08/22 239 lb 6.4 oz (108.6 kg)  11/08/21 236 lb 6.4 oz (107.2 kg)  08/18/21 239 lb (108.4 kg)  On ozempic 2 but feels like clothes are doing much better.   Insomnia (Resolved)   She reports severe insomnia has now moved to fantastic sleep with just gratitude exercises so I am going to resolve this from the problem list for now   Igt (Impaired Glucose Tolerance) (Resolved)           Objective:  Physical Exam: BP 139/72 (BP Location: Right Arm, Patient Position: Sitting, Cuff Size: Large)   Temp 97.9 F (36.6 C)  (Temporal)   Ht '5\' 4"'$  (1.626 m)   Wt 239 lb 6.4 oz (108.6 kg)   BMI 41.09 kg/m   Problem-specific physical exam findings:  Physical Exam Vitals and nursing note reviewed.  Constitutional:      General: She is awake. She is not in acute distress.    Appearance: Normal appearance. She is well-developed. She is obese. She is not ill-appearing, toxic-appearing or diaphoretic.  HENT:     Head: Normocephalic and atraumatic.     Nose: Congestion (frequent sniffling) present.  Eyes:     General:  Lids are normal. Vision grossly intact. No scleral icterus.       Right eye: No discharge.        Left eye: No discharge.     Conjunctiva/sclera: Conjunctivae normal.  Pulmonary:     Effort: Pulmonary effort is normal. No accessory muscle usage or respiratory distress.  Skin:    Coloration: Skin is not jaundiced.     Findings: No rash.  Neurological:     General: No focal deficit present.     Mental Status: She is alert.  Psychiatric:        Attention and Perception: Perception normal.        Mood and Affect: Mood normal.        Behavior: Behavior normal.        Thought Content: Thought content normal.        Judgment: Judgment normal.

## 2022-02-08 NOTE — Patient Instructions (Addendum)
It was a pleasure seeing you today! I truly hope you feel like you received 5 star service and please let me know if there is anything I can improve.  Loralee Pacas, MD   Today the plan is... Stop taking fenofibrate and replace it with omega-3 fatty acid 1 g twice daily to manage your triglycerides. Resume Ozempic - sent Zofran if it causes some nausea on restart.  Resistance exercises to maintain muscle.  Take antibiotics for sinus symptoms if the persist or there is pus drainage  Morbid obesity with BMI of 40.0-44.9, adult (HCC) -     Ozempic (2 MG/DOSE); INJECT 2 MG AS DIRECTED ONCE A WEEK.  Dispense: 27 mL; Refill: 3  Need for influenza vaccination -     Flu Vaccine QUAD 56moIM (Fluarix, Fluzone & Alfiuria Quad PF)  Morbid obesity (HCC) Assessment & Plan: Chronic, stable, improving despite no weight loss Continue topiramate, resume ozempic @ 2 mg weekly I tried to refill this back for a year and offered that we will do more -call and see if there is any issues with the financial side of it j  I offered a nausea medicine zofran just in case she ever gets any when she is restarting because she has been out She is doing walking and steps- I encouraged make sure to does resistance as well   Nausea -     Ondansetron; Take 1 tablet (4 mg total) by mouth every 8 (eight) hours as needed for nausea or vomiting (for nausea from wegovy or other source).  Dispense: 20 tablet; Refill: 0  Adjustment insomnia Assessment & Plan: Gratitude exercises have been immensely helpful   Itching of ear  Hypertriglyceridemia Assessment & Plan: She reports that she used to be higher than the 177 max we have from 2021 but we do not have the records on that.  I did a chart review to look for any records for it could not find anything and so I recommend we just stop the fenofibrate and then recheck the cholesterol here in another 3 to 6 months and see if the triglycerides are reasonable.  If they do not go  up over 500 I think the fenofibrate should be stopped in accordance with current recommendations and replaced with omega-3 fatty acid.  I also recommended that she go ahead and start taking an omega-3 fatty acid   Other chronic sinusitis -     Amoxicillin-Pot Clavulanate; Take 1 tablet by mouth 2 (two) times daily.  Dispense: 20 tablet; Refill: 0  Primary hypertension Assessment & Plan: Chronic stable controlled- but borderline Considered reducing losartan-hctz but not enough weight loss          '[x]'$  RETURN TO CLINIC: Return in about 27 weeks (around 08/16/2022) for Annual Exam.   - If you are not doing well: RETURN to the office sooner. - Please bring all your medicines to each appointment.  - If your condition begins to worsen or become severe:  GO to the ER.

## 2022-02-08 NOTE — Assessment & Plan Note (Addendum)
Chronic, stable, improving despite no weight loss Continue topiramate, resume ozempic @ 2 mg weekly I tried to refill this back for a year and offered that we will do more -call and see if there is any issues with the financial side of it j  I offered a nausea medicine zofran just in case she ever gets any when she is restarting because she has been out She is doing walking and steps- I encouraged make sure to does resistance as well

## 2022-02-08 NOTE — Assessment & Plan Note (Signed)
Chronic stable controlled- but borderline Considered reducing losartan-hctz but not enough weight loss

## 2022-02-14 ENCOUNTER — Ambulatory Visit: Payer: 59 | Admitting: Registered Nurse

## 2022-02-14 NOTE — Telephone Encounter (Signed)
Pt called wanting to make sure her msg was received. She is wanting to know what alternatives are available. She has been out for 3 weeks now. Please advise.

## 2022-02-18 ENCOUNTER — Other Ambulatory Visit: Payer: Self-pay | Admitting: Internal Medicine

## 2022-02-18 MED ORDER — SEMAGLUTIDE 3 MG PO TABS: 1.0000 | ORAL_TABLET | Freq: Every day | ORAL | 3 refills | Status: DC

## 2022-02-18 NOTE — Progress Notes (Signed)
Can't get Ozempic due to backorder try rybelsus next

## 2022-03-03 ENCOUNTER — Encounter: Payer: Self-pay | Admitting: *Deleted

## 2022-03-30 IMAGING — CT CT ABD-PELV W/ CM
2 of 5 series · 16 of 46 positions shown, 18 images · IV contrast (agent unspecified)
Comparison: None.

CLINICAL DATA: 50-year-old female with abdominal pain and diarrhea.

EXAM:
CT ABDOMEN AND PELVIS WITH CONTRAST
TECHNIQUE: Multidetector CT imaging of the abdomen and pelvis was performed
using the standard protocol following bolus administration of
intravenous contrast.

[Series 3: a/p w/ 5mm · axial · 0.98mm/px · z∈[+738,+1173]mm · 13 of 99 slices shown, 15 images]
[im 6/99  soft-tissue]
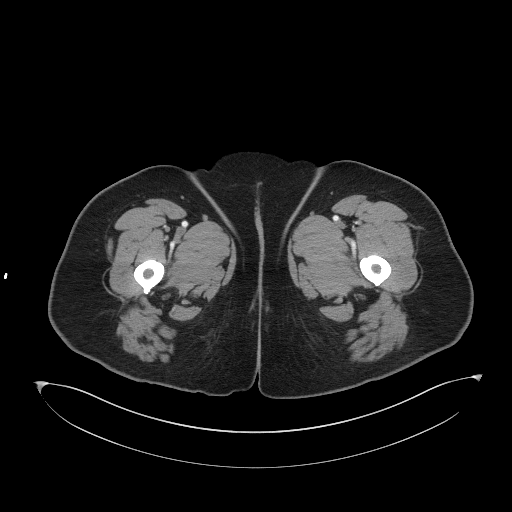
[im 6/99  bone]
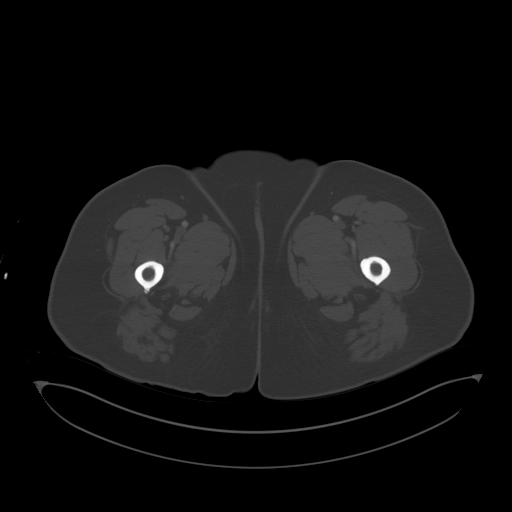
[im 12/99  soft-tissue]
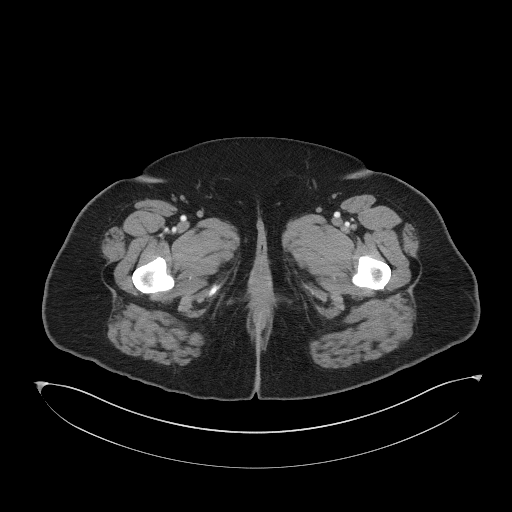
[im 24/99  soft-tissue]
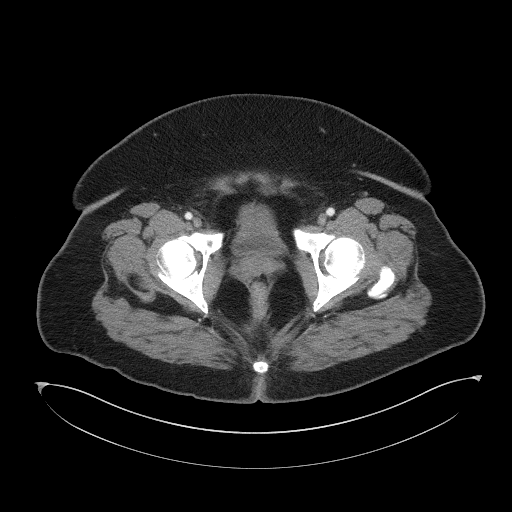
[im 29/99  soft-tissue]
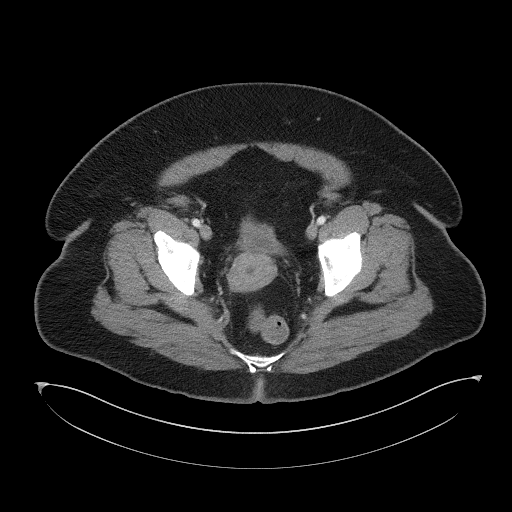
[im 35/99  soft-tissue]
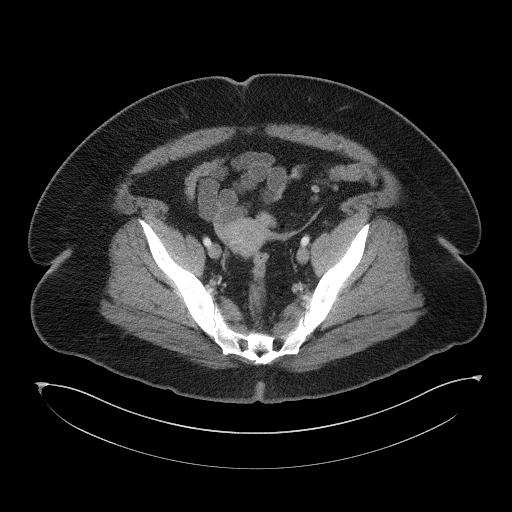
[im 41/99  soft-tissue]
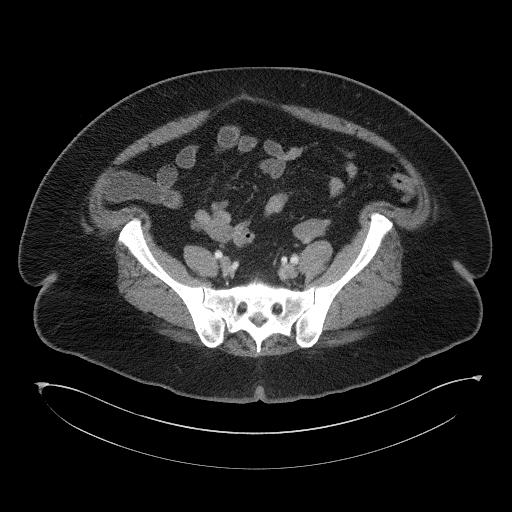
[im 52/99  soft-tissue]
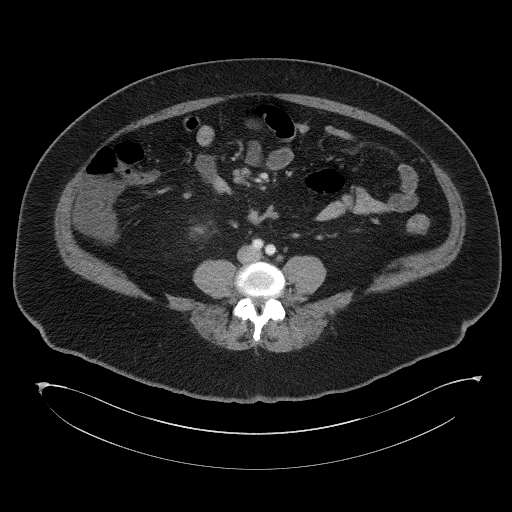
[im 58/99  soft-tissue]
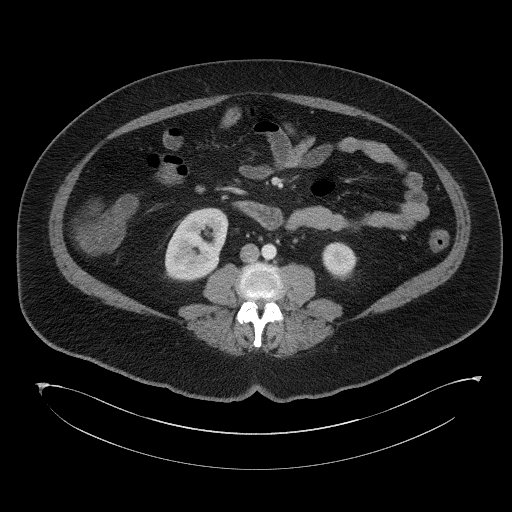
[im 64/99  soft-tissue]
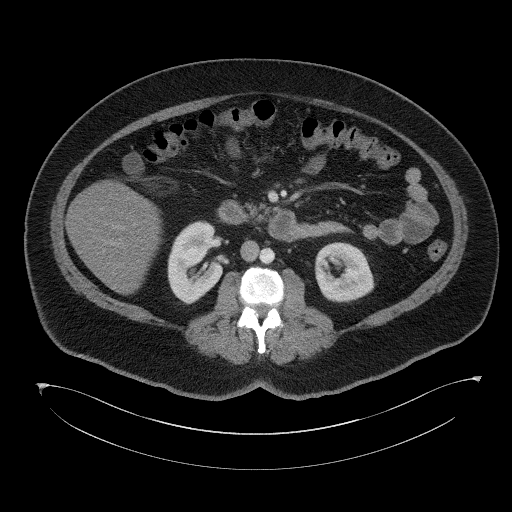
[im 64/99  bone]
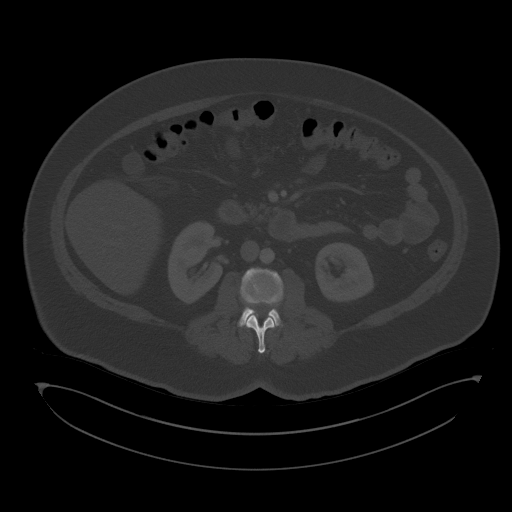
[im 70/99  soft-tissue]
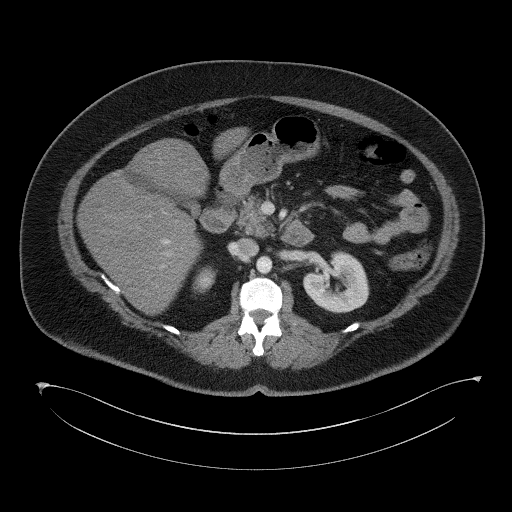
[im 75/99  soft-tissue]
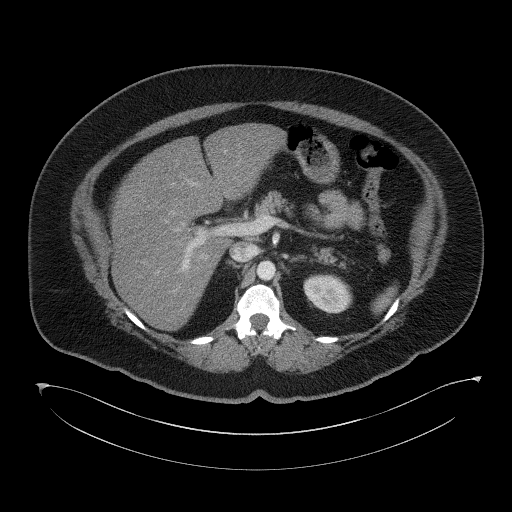
[im 87/99  soft-tissue]
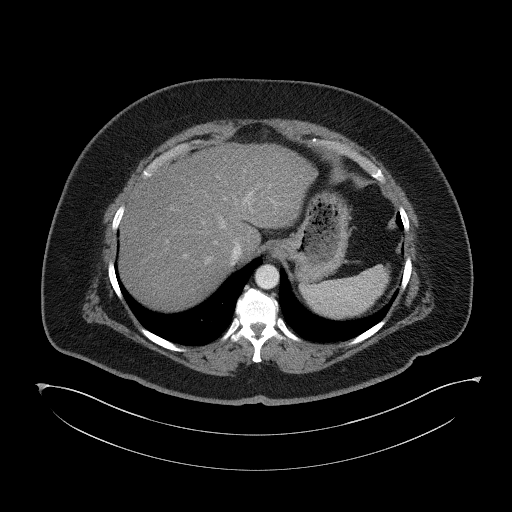
[im 93/99  soft-tissue]
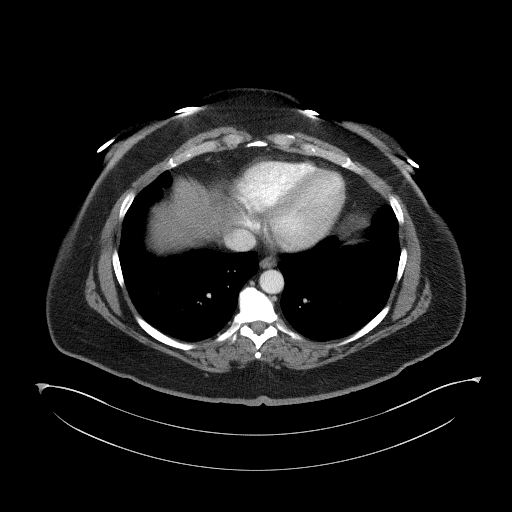

[Series 6: a/p w/ cor · coronal · 0.98mm/px · 3 of 191 slices shown]
[im 64/191  soft-tissue]
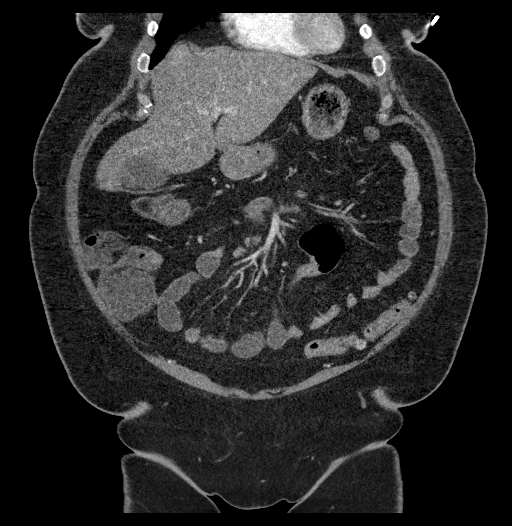
[im 85/191  soft-tissue]
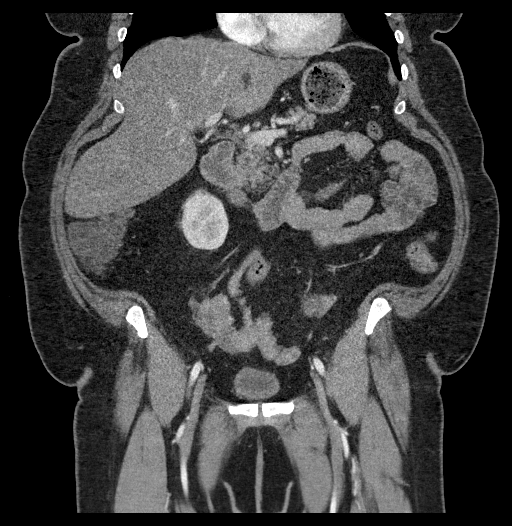
[im 106/191  soft-tissue]
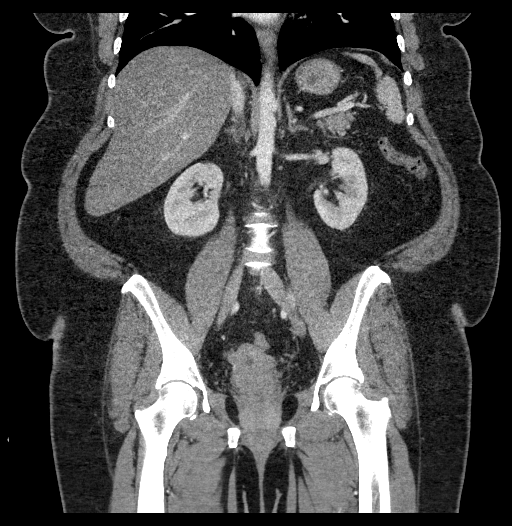

[16 of 46 positions shown; findings below may reference images not displayed]

RADIATION DOSE REDUCTION: This exam was performed according to the
departmental dose-optimization program which includes automated
exposure control, adjustment of the mA and/or kV according to
patient size and/or use of iterative reconstruction technique.

CONTRAST:  100mL OMNIPAQUE IOHEXOL 300 MG/ML  SOLN
FINDINGS: Lower chest: No acute abnormality.

Hepatobiliary: Diffusely decreased attenuation of the hepatic
parenchyma. Normal sized liver with smooth contour. No focal masses.
The gallbladder is present unremarkable. No intra or extrahepatic
biliary ductal dilation.

Pancreas: Unremarkable. No pancreatic ductal dilatation or
surrounding inflammatory changes.

Spleen: Normal in size without focal abnormality.

Adrenals/Urinary Tract: Adrenal glands are unremarkable. Kidneys are
normal, without renal calculi, focal lesion, or hydronephrosis.
Bladder is unremarkable.

Stomach/Bowel: Stomach is within normal limits. Appendix is not
definitively visualized. Scattered descending and sigmoid colonic
diverticula without surrounding inflammatory changes. No evidence of
bowel wall thickening, distention, or inflammatory changes.

Vascular/Lymphatic: No significant vascular findings are present. No
enlarged abdominal or pelvic lymph nodes.

Reproductive: Uterus and bilateral adnexa are unremarkable.

Other: Small fat containing umbilical hernia.  No ascites

Musculoskeletal: No acute or significant osseous findings.
IMPRESSION: 1. No acute abdominopelvic abnormality.
2. Hepatic steatosis.
3. Small fat containing umbilical hernia.
4. Diverticulosis, no evidence of diverticulitis.

## 2022-07-11 ENCOUNTER — Encounter: Payer: Self-pay | Admitting: Internal Medicine

## 2022-07-11 ENCOUNTER — Ambulatory Visit: Payer: 59 | Admitting: Internal Medicine

## 2022-07-11 ENCOUNTER — Other Ambulatory Visit: Payer: Self-pay | Admitting: Internal Medicine

## 2022-07-11 VITALS — BP 120/82 | HR 70 | Temp 98.1°F | Ht 64.0 in | Wt 240.0 lb

## 2022-07-11 DIAGNOSIS — R42 Dizziness and giddiness: Secondary | ICD-10-CM | POA: Insufficient documentation

## 2022-07-11 DIAGNOSIS — R110 Nausea: Secondary | ICD-10-CM | POA: Diagnosis not present

## 2022-07-11 DIAGNOSIS — H7409 Tympanosclerosis, unspecified ear: Secondary | ICD-10-CM

## 2022-07-11 DIAGNOSIS — R29810 Facial weakness: Secondary | ICD-10-CM | POA: Insufficient documentation

## 2022-07-11 DIAGNOSIS — H7403 Tympanosclerosis, bilateral: Secondary | ICD-10-CM | POA: Diagnosis not present

## 2022-07-11 DIAGNOSIS — J328 Other chronic sinusitis: Secondary | ICD-10-CM | POA: Diagnosis not present

## 2022-07-11 DIAGNOSIS — Q670 Congenital facial asymmetry: Secondary | ICD-10-CM

## 2022-07-11 HISTORY — DX: Tympanosclerosis, unspecified ear: H74.09

## 2022-07-11 MED ORDER — ONDANSETRON 4 MG PO TBDP
4.0000 mg | ORAL_TABLET | Freq: Three times a day (TID) | ORAL | 5 refills | Status: DC | PRN
Start: 2022-07-11 — End: 2022-07-12

## 2022-07-11 MED ORDER — AMOXICILLIN-POT CLAVULANATE 875-125 MG PO TABS: 1.0000 | ORAL_TABLET | Freq: Two times a day (BID) | ORAL | 0 refills | Status: DC

## 2022-07-11 MED ORDER — MECLIZINE HCL 25 MG PO TABS
25.0000 mg | ORAL_TABLET | Freq: Three times a day (TID) | ORAL | 2 refills | Status: DC | PRN
Start: 2022-07-11 — End: 2023-03-01

## 2022-07-11 NOTE — Progress Notes (Signed)
[facility]: [id number]   Routine Medical Office Visit  Patient:  Debbie [name]      Age: 52 y.o.       Sex:  female  Date:   07/11/2022 PCP:    Lula Olszewski, MD   Today's Healthcare Provider: Lula Olszewski, MD   Assessment and Plan:   Vertigo Assessment & Plan: We did Epley maneuvers today and Dix-Hallpike I did not see any nystagmus and she had worsening of the vertigo whenever she went down into the right but upon doing the left side it seemed to improve.  I advised her to try doing the Epley maneuvers on her home efforts and gave a handout I also gave regular vertigo handout and meclizine and Zofran to help with this and also got an MRI for it at this time because she is aknowledged recent strokelike symptoms (weakness in right face /smile) .  I did add it to ENT referral.  We briefly discussed the potential benefits of vestibular rehab but she expressed a preference to not move forward with my offer(s) to facilitate the proposed intervention(s) at this time. We will revisit this option if needed at future visits and  she is always encouraged to contact me if the situation changes or she changes her mind.   Orders: -     Meclizine HCl; Take 1 tablet (25 mg total) by mouth 3 (three) times daily as needed for dizziness.  Dispense: 90 tablet; Refill: 2 -     Ambulatory referral to ENT -     MR BRAIN W WO CONTRAST; Future  Other chronic sinusitis -     Amoxicillin-Pot Clavulanate; Take 1 tablet by mouth 2 (two) times daily.  Dispense: 20 tablet; Refill: 0 -     Ambulatory referral to ENT  Tympanosclerosis of both ears -     Ambulatory referral to ENT  Nausea -     Ondansetron; Take 1 tablet (4 mg total) by mouth every 8 (eight) hours as needed for nausea or vomiting (for nausea from wegovy or other source).  Dispense: 60 tablet; Refill: 5  Facial asymmetry -     MR BRAIN W WO CONTRAST; Future  Weakness on right side of face       Clinical  Presentation:   52 y.o. female here today for Dizziness (Since last Monday, when laying down on back and having to bend down especially. Has been intermittent for several years.) and Ear Drainage  HPI   Updated chart data:  Problem  Vertigo   07/11/22 history: flaring as per reason for visit 07/11/22.  Ruined beach trip.  Associated with some fullness in the ears bilaterally   Prior history: Since 52 years old Room spins when she lies down Usually takes meclizine- puts her to sleep then wakes up without vertigo Was so bad over the years prior Primary Care Provider (PCP) threatened of the take driver's license    Tympanosclerosis   Many ear infection(s) over the years Following with Dr. Irena Cords    Weakness On Right Side of Face   Smile doesn't go up/pull back as far on the right.  Time of onset unknown but at least 3-4 years when reported at 07/11/22 visit.      Reviewed chart data: Active Ambulatory Problems    Diagnosis Date Noted   Hypothyroidism    Hyperlipidemia    Seasonal allergies    Morbid obesity 02/08/2019   Hypertension 02/20/2019   Visit  for routine gyn exam 05/20/2021   Screening mammogram for breast cancer 05/20/2021   History of endometrial ablation 05/20/2021   Upper abdominal pain 08/18/2021   Itching of ear 08/18/2021   Hypertriglyceridemia 02/08/2022   Other chronic sinusitis 02/08/2022   Vertigo 07/11/2022   Tympanosclerosis 07/11/2022   Weakness on right side of face 07/11/2022   Resolved Ambulatory Problems    Diagnosis Date Noted   Acute renal failure (ARF) 10/01/2015   Depression    IGT (impaired glucose tolerance) 04/03/2019   Insomnia 11/08/2021   Past Medical History:  Diagnosis Date   Allergy    Elevated cholesterol    Sleep apnea    Thyroid disease     Outpatient Medications Prior to Visit  Medication Sig   albuterol (VENTOLIN HFA) 108 (90 Base) MCG/ACT inhaler 1 PUFF AS NEEDED EVERY 4 HOURS FOR COUGH/WHEEZE INHALATION 90 DAYS    Ascorbic Acid (VITAMIN C) 1000 MG tablet Take 1,000 mg by mouth daily.   Azelastine-Fluticasone 137-50 MCG/ACT SUSP PLACE 1 SPRAY INTO THE NOSE EVERY 12 (TWELVE) HOURS.   BIOTIN PO Take 1 tablet by mouth daily.   cetirizine (ZYRTEC) 10 MG tablet Take 10 mg by mouth daily.   Cholecalciferol (D3-1000 PO) Take 1,000 Units by mouth daily.   Cyanocobalamin (VITAMIN B12 PO) Take by mouth daily.   escitalopram (LEXAPRO) 20 MG tablet Take 1 tablet (20 mg total) by mouth daily.   ibuprofen (ADVIL) 600 MG tablet Take 600 mg by mouth every 8 (eight) hours as needed for cramping, moderate pain, mild pain, headache or fever.   levothyroxine (SYNTHROID) 100 MCG tablet Take 1 tablet (100 mcg total) by mouth daily.   losartan-hydrochlorothiazide (HYZAAR) 50-12.5 MG tablet Take 1 tablet by mouth daily. Ok to start with just half tablet daily for week 1 and leave it there if goal of 140/90 reached.   montelukast (SINGULAIR) 10 MG tablet Take 1 tablet by mouth every evening.   Multiple Vitamins-Minerals (HAIR/SKIN/NAILS) CAPS Take 1 capsule by mouth daily.   Multiple Vitamins-Minerals (MULTIVITAMIN WOMEN 50+) TABS Take 1 tablet by mouth 1 day or 1 dose.   NEOMYCIN-POLYMYXIN-HYDROCORTISONE (CORTISPORIN) 1 % SOLN OTIC solution Place 1-3 drops into both ears every 8 (eight) hours as needed.   OZEMPIC, 2 MG/DOSE, 8 MG/3ML SOPN Inject 2 mg into the skin once a week.   rosuvastatin (CRESTOR) 20 MG tablet Take 1 tablet (20 mg total) by mouth daily.   Semaglutide 3 MG TABS Take 1 tablet by mouth daily.   topiramate (TOPAMAX) 25 MG tablet Take 1 tablet (25 mg total) by mouth 2 (two) times daily. Take just one tablet daily for the first week.   Zinc 50 MG TABS Take by mouth daily.   [DISCONTINUED] Ascorbic Acid (VITAMIN C PO) Take by mouth daily.   [DISCONTINUED] ondansetron (ZOFRAN-ODT) 4 MG disintegrating tablet Take 1 tablet (4 mg total) by mouth every 8 (eight) hours as needed for nausea or vomiting (for nausea from  wegovy or other source).   [DISCONTINUED] Zinc 100 MG TABS 1 tablet Orally Once a day for 30 day(s)   hydrochlorothiazide (HYDRODIURIL) 25 MG tablet TAKE 1 TABLET BY MOUTH EVERY DAY Oral for 90 (Patient not taking: Reported on 07/11/2022)   [DISCONTINUED] amoxicillin-clavulanate (AUGMENTIN) 875-125 MG tablet Take 1 tablet by mouth 2 (two) times daily. (Patient not taking: Reported on 07/11/2022)   No facility-administered medications prior to visit.             Clinical Data Analysis:  Physical Exam  BP 120/82 (BP Location: Left Arm, Patient Position: Sitting)   Pulse 70   Temp 98.1 F (36.7 C) (Temporal)   Ht  (1.626 m)   Wt 240 lb (108.9 kg)   SpO2 97%   BMI 41.20 kg/m  Wt Readings from Last 10 Encounters:  07/11/22 240 lb (108.9 kg)  02/08/22 239 lb 6.4 oz (108.6 kg)  11/08/21 236 lb 6.4 oz (107.2 kg)  08/18/21 239 lb (108.4 kg)  06/11/21 234 lb (106.1 kg)  05/20/21 245 lb 12.8 oz (111.5 kg)  03/24/21 252 lb 3.2 oz (114.4 kg)  12/22/20 254 lb 3.2 oz (115.3 kg)  11/18/20 248 lb (112.5 kg)  11/12/20 248 lb (112.5 kg)   Vital signs reviewed.  Nursing notes reviewed. Weight trend reviewed. Abnormalities and Problem-Specific physical exam findings:  after epley, I was asking about cerebrovascular accident symptom(s) and she said she thinks she might have had 1 due to smile lost symmetry a few years ago- this was confirmed on exam-  R facial smile muscles not pulling back as far.  She is able to walk without ataxia. The vertigo is episodic and associated with positional changes during epley. No other neurology deficits are identifiable grossly General Appearance:  No acute distress appreciable.   Well-groomed, healthy-appearing female.  Well proportioned with no abnormal fat distribution.  Good muscle tone. Skin: Clear and well-hydrated. Pulmonary:  Normal work of breathing at rest, no respiratory distress apparent. SpO2: 97 %  Musculoskeletal: All extremities are intact.   Neurological:  Awake, alert, oriented, and engaged.  No obvious focal neurological deficits or cognitive impairments.  Sensorium seems unclouded. Gait is smooth and coordinated.  Speech is clear and coherent with logical content. Psychiatric:  Appropriate mood, pleasant and cooperative demeanor, cheerful and engaged during the exam   Additional Results Reviewed:     No results found for any visits on 07/11/22.  No results found for this or any previous visit (from the past 2160 hour(s)).  No image results found.   No results found.   --------------------------------    Signed: Lula Olszewski, MD 07/11/2022 8:48 PM

## 2022-07-11 NOTE — Assessment & Plan Note (Addendum)
We did Epley maneuvers today and Dix-Hallpike I did not see any nystagmus and she had worsening of the vertigo whenever she went down into the right but upon doing the left side it seemed to improve.  I advised her to try doing the Epley maneuvers on her home efforts and gave a handout I also gave regular vertigo handout and meclizine and Zofran to help with this and also got an MRI for it at this time because she is aknowledged recent strokelike symptoms (weakness in right face /smile) .  I did add it to ENT referral.  We briefly discussed the potential benefits of vestibular rehab but she expressed a preference to not move forward with my offer(s) to facilitate the proposed intervention(s) at this time. We will revisit this option if needed at future visits and  she is always encouraged to contact me if the situation changes or she changes her mind.

## 2022-07-11 NOTE — Patient Instructions (Addendum)
This shows  how the eustachian canal works to drain the inner ear and how it is connected to the nasopharynx.  Nasal sinus rinses with saline nasal mist sprays can rinse all of the pollen and allergens and other irritants and pathogens out of his sinuses and nasopharynx, reducing the plugging/swelling around the eustachian tube.  The sinus rinse clears away the mucus and allows nasal steroid sprays to help reach the eustachian tube and reduce swellling to open it up.    Basic Sinus Care: Mist each nostril nightly with sterile saline nasal mist, then spray each nostril immediately after with fluticasone nasal spray (Flonase) or other steroid or antihistamine nasal pray Next, if symptoms persist, add a daily allergy pill.  Benadryl is the strongest but will make you very drowsy so only take when you can sleep after.  Ear Pressure Relief Maneuvers: Step 1 If the congestion is mild, you can often use simple maneuvers to quickly alter the pressure in your middle ear, such as: Swallowing Yawning Chewing gum Sucking on hard candy Similar methods can be used on children. If traveling with an infant or toddler, try giving them a bottle, pacifier, or something to drink or suck on.  Warm compress: Applying a warm, moist cloth to the back of your ear can help reduce swelling and help drain congested passages. In some cases, these interventions will cause the ears will pop without trying. If they don't, give it 20 minutes and see if swallowing, yawning, chewing gum, or sucking on hard candy helps.  Step 2 If these methods alone don't help, you can try other interventions like:  Decongestants: OTC drugs like Afrin (oxymetazoline) or Sudafed (pseudoephedrine) work by reducing the swelling of blood vessels in the nasal passages and Eustachian tubes.  Never use these medications for more than a few days at a time, especially afrin is dependency-forming  If this isn't working or you need more than a few days  of afrin or sudafed... you should make an appointment.   Step 3 (just for mod severe ear pressure and pain) If these interventions don't help, there are three other advanced strategies you can try called the Valsalva maneuver, the Toynbee maneuver, and the Frenzel maneuver.  Advanced Strategy 1:  The Valsalva maneuver Inhale. Pinch your nose shut with your fingers. Keeping your lips tightly shut, blow out forcefully as if you are blowing up a balloon. To increase the pressure, try bearing down as if having a bowel movement.  Advanced Strategy 2:  The Toynbee maneuver The Toynbee maneuver may also be safer than the Valsalva maneuver if you've had a previous eardrum injury. The Valsalva method exerts much more pressure on the eardrum and can possibly cause a rupture if you blow too forcefully.  Keep your mouth tightly shut. Pinch your nose shut with your fingers. Swallow hard.  Advanced Strategy 3: The Frenzel maneuver Pinch your nose shut with your fingers Close your mouth and place the tip of your tongue behind your upper front teeth. Push the back of your tongue to the roof of your mouth as if making a hard "G" or "K" sound. The back of your tongue will touch the roof. While doing this, close your vocal folds at the back of your throat and lift your larynx (voice box) up to push the air out of your mouth and into your nose.  ------------------------------------------------------------------------ If all this fails despite extensive efforts then you need to go to an ear nose and throat for   surgical correction - but this should not be tried until everything else fails.      

## 2022-07-13 ENCOUNTER — Other Ambulatory Visit: Payer: Self-pay | Admitting: Internal Medicine

## 2022-07-13 DIAGNOSIS — R110 Nausea: Secondary | ICD-10-CM

## 2022-07-18 ENCOUNTER — Ambulatory Visit: Payer: 59 | Admitting: Internal Medicine

## 2022-07-19 ENCOUNTER — Encounter: Payer: Self-pay | Admitting: Internal Medicine

## 2022-07-19 DIAGNOSIS — R42 Dizziness and giddiness: Secondary | ICD-10-CM

## 2022-07-27 ENCOUNTER — Ambulatory Visit (INDEPENDENT_AMBULATORY_CARE_PROVIDER_SITE_OTHER): Payer: 59 | Admitting: Obstetrics and Gynecology

## 2022-07-27 ENCOUNTER — Encounter: Payer: Self-pay | Admitting: Obstetrics and Gynecology

## 2022-07-27 ENCOUNTER — Other Ambulatory Visit: Payer: Self-pay

## 2022-07-27 VITALS — BP 136/85 | HR 76 | Ht 64.0 in | Wt 241.3 lb

## 2022-07-27 DIAGNOSIS — Z01419 Encounter for gynecological examination (general) (routine) without abnormal findings: Secondary | ICD-10-CM | POA: Diagnosis not present

## 2022-07-27 NOTE — Progress Notes (Signed)
[name] is a 52 y.o. No obstetric history on file. female here for a routine annual gynecologic exam.  Current complaints: Some menopausal Sx..   Denies abnormal vaginal bleeding, discharge, pelvic pain, problems with intercourse or other gynecologic concerns.    Gynecologic History No LMP recorded. Patient has had an ablation. Contraception: post menopausal status Last Pap: 3/23. Results were: normal Last mammogram: 4/23. Results were: normal  Obstetric History OB History  No obstetric history on file.    Past Medical History:  Diagnosis Date   Acute renal failure (ARF) (HCC) 10/01/2015   Allergy    Depression    Elevated cholesterol    History of endometrial ablation 05/20/2021   2021   Hyperlipidemia    Hypertension    Hypertriglyceridemia 02/08/2022   No components found for: "TG" Lab Results Component Value Date/Time  TRIG 125.0 08/18/2021 08:53 AM  TRIG 163.0 (H) 12/29/2020 09:20 AM  TRIG 148.0 08/04/2020 08:24 AM  TRIG 177.0 (H) 04/03/2019 07:30 AM     Hypothyroidism    Insomnia 11/08/2021   Morbid obesity (HCC) 02/08/2019   Seasonal allergies    Sleep apnea    wears cpap    Thyroid disease    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 Irena Cords     Past Surgical History:  Procedure Laterality Date   ABLATION  10/2019   ADENOIDECTOMY     COLONOSCOPY     DENTAL SURGERY     graft of mouth     Current Outpatient Medications on File Prior to Visit  Medication Sig Dispense Refill   albuterol (VENTOLIN HFA) 108 (90 Base) MCG/ACT inhaler 1 PUFF AS NEEDED EVERY 4 HOURS FOR COUGH/WHEEZE INHALATION 90 DAYS     Ascorbic Acid (VITAMIN C) 1000 MG tablet Take 1,000 mg by mouth daily.     Azelastine-Fluticasone 137-50 MCG/ACT SUSP PLACE 1 SPRAY INTO THE NOSE EVERY 12 (TWELVE) HOURS. 23 g 1   BIOTIN PO Take 1 tablet by mouth daily.     cetirizine (ZYRTEC) 10 MG tablet Take 10 mg by mouth daily.     Cholecalciferol  (D3-1000 PO) Take 1,000 Units by mouth daily.     Cyanocobalamin (VITAMIN B12 PO) Take by mouth daily.     escitalopram (LEXAPRO) 20 MG tablet Take 1 tablet (20 mg total) by mouth daily. 90 tablet 3   ibuprofen (ADVIL) 600 MG tablet Take 600 mg by mouth every 8 (eight) hours as needed for cramping, moderate pain, mild pain, headache or fever.     levothyroxine (SYNTHROID) 100 MCG tablet Take 1 tablet (100 mcg total) by mouth daily. 90 tablet 3   losartan-hydrochlorothiazide (HYZAAR) 50-12.5 MG tablet Take 1 tablet by mouth daily. Ok to start with just half tablet daily for week 1 and leave it there if goal of 140/90 reached. 90 tablet 3   meclizine (ANTIVERT) 25 MG tablet Take 1 tablet (25 mg total) by mouth 3 (three) times daily as needed for dizziness. 90 tablet 2   montelukast (SINGULAIR) 10 MG tablet Take 1 tablet by mouth every evening.  5   Multiple Vitamins-Minerals (HAIR/SKIN/NAILS) CAPS Take 1 capsule by mouth daily.     Multiple Vitamins-Minerals (MULTIVITAMIN WOMEN 50+) TABS Take 1 tablet by mouth 1 day or 1 dose.     NEOMYCIN-POLYMYXIN-HYDROCORTISONE (CORTISPORIN) 1 % SOLN OTIC solution Place 1-3 drops into both ears every 8 (eight) hours as needed. 10 mL 0   OZEMPIC, 2 MG/DOSE,  8 MG/3ML SOPN Inject 2 mg into the skin once a week.     rosuvastatin (CRESTOR) 20 MG tablet Take 1 tablet (20 mg total) by mouth daily. 90 tablet 3   topiramate (TOPAMAX) 25 MG tablet Take 1 tablet (25 mg total) by mouth 2 (two) times daily. Take just one tablet daily for the first week. 180 tablet 3   Zinc 50 MG TABS Take by mouth daily.     hydrochlorothiazide (HYDRODIURIL) 25 MG tablet TAKE 1 TABLET BY MOUTH EVERY DAY Oral for 90 (Patient not taking: Reported on 07/11/2022)     No current facility-administered medications on file prior to visit.    Allergies  Allergen Reactions   Atorvastatin Hives    Social History   Socioeconomic History   Marital status: Married    Spouse name: Not on file    Number of children: 0   Years of education: Not on file   Highest education level: Not on file  Occupational History   Not on file  Tobacco Use   Smoking status: Never   Smokeless tobacco: Never  Vaping Use   Vaping Use: Never used  Substance and Sexual Activity   Alcohol use: No   Drug use: No   Sexual activity: Not on file  Other Topics Concern   Not on file  Social History Narrative   Not on file   Social Determinants of Health   Financial Resource Strain: Not on file  Food Insecurity: No Food Insecurity (05/20/2021)   Hunger Vital Sign    Worried About Running Out of Food in the Last Year: Never true    Ran Out of Food in the Last Year: Never true  Transportation Needs: No Transportation Needs (05/20/2021)   PRAPARE - Administrator, Civil Service (Medical): No    Lack of Transportation (Non-Medical): No  Physical Activity: Not on file  Stress: Not on file  Social Connections: Not on file  Intimate Partner Violence: Not on file    Family History  Problem Relation Age of Onset   Heart disease Father    Hypertension Mother    Hyperlipidemia Mother    Breast cancer Maternal Grandmother    Prostate cancer Maternal Grandfather    Diabetes Maternal Grandfather    CAD Maternal Grandfather    Colon cancer Neg Hx    Colon polyps Neg Hx    Esophageal cancer Neg Hx    Stomach cancer Neg Hx    Rectal cancer Neg Hx     The following portions of the patient's history were reviewed and updated as appropriate: allergies, current medications, past family history, past medical history, past social history, past surgical history and problem list.  Review of Systems Pertinent items noted in HPI and remainder of comprehensive ROS otherwise negative.   Objective:  BP 136/85   Pulse 76   Ht 5\' 4"  (1.626 m)   Wt 241 lb 4.8 oz (109.5 kg)   BMI 41.42 kg/m  Chaperone present CONSTITUTIONAL: Well-developed, well-nourished female in no acute distress.  HENT:   Normocephalic, atraumatic, External right and left ear normal. Oropharynx is clear and moist EYES: Conjunctivae and EOM are normal. Pupils are equal, round, and reactive to light. No scleral icterus.  NECK: Normal range of motion, supple, no masses.  Normal thyroid.  SKIN: Skin is warm and dry. No rash noted. Not diaphoretic. No erythema. No pallor. NEUROLGIC: Alert and oriented to person, place, and time. Normal reflexes, muscle tone coordination.  No cranial nerve deficit noted. PSYCHIATRIC: Normal mood and affect. Normal behavior. Normal judgment and thought content. CARDIOVASCULAR: Normal heart rate noted, regular rhythm RESPIRATORY: Clear to auscultation bilaterally. Effort and breath sounds normal, no problems with respiration noted. BREASTS: Symmetric in size. No masses, skin changes, nipple drainage, or lymphadenopathy. ABDOMEN: Soft, normal bowel sounds, no distention noted.  No tenderness, rebound or guarding.  PELVIC: Deferred MUSCULOSKELETAL: Normal range of motion. No tenderness.  No cyanosis, clubbing, or edema.  2+ distal pulses.   Assessment:  Annual gynecologic examination    Plan:  Discussed Tx options for menopausal Sx. Pt wants to follow for now.. Mammogram scheduled Routine preventative health maintenance measures emphasized. Please refer to After Visit Summary for other counseling recommendations.    Hermina Staggers, MD, FACOG Attending Obstetrician & Gynecologist Center for [facility], [facility]

## 2022-07-28 ENCOUNTER — Other Ambulatory Visit: Payer: Self-pay

## 2022-07-28 NOTE — Addendum Note (Signed)
Addended by: Donzetta Starch on: 07/28/2022 01:15 PM   Modules accepted: Orders

## 2022-08-02 ENCOUNTER — Encounter: Payer: 59 | Admitting: Medical

## 2022-08-04 NOTE — Telephone Encounter (Signed)
Pt called again and would like to know where in the process the referral to a neurologist.

## 2022-08-17 ENCOUNTER — Ambulatory Visit
Admission: RE | Admit: 2022-08-17 | Discharge: 2022-08-17 | Disposition: A | Discharge status: Home / Self Care | Payer: 59 | Source: Ambulatory Visit | Attending: Internal Medicine | Admitting: Internal Medicine

## 2022-08-17 DIAGNOSIS — Q670 Congenital facial asymmetry: Secondary | ICD-10-CM

## 2022-08-17 DIAGNOSIS — R42 Dizziness and giddiness: Secondary | ICD-10-CM

## 2022-08-17 MED ORDER — GADOPICLENOL 0.5 MMOL/ML IV SOLN
10.0000 mL | Freq: Once | INTRAVENOUS | Status: AC | PRN
  Administered 2022-08-17: 10 mL via INTRAVENOUS

## 2022-08-22 ENCOUNTER — Ambulatory Visit (INDEPENDENT_AMBULATORY_CARE_PROVIDER_SITE_OTHER): Payer: 59 | Admitting: Internal Medicine

## 2022-08-22 ENCOUNTER — Encounter: Payer: Self-pay | Admitting: Internal Medicine

## 2022-08-22 VITALS — BP 130/82 | HR 68 | Temp 97.9°F | Ht 64.0 in | Wt 246.0 lb

## 2022-08-22 DIAGNOSIS — R5383 Other fatigue: Secondary | ICD-10-CM

## 2022-08-22 DIAGNOSIS — F338 Other recurrent depressive disorders: Secondary | ICD-10-CM | POA: Diagnosis not present

## 2022-08-22 DIAGNOSIS — Z13220 Encounter for screening for lipoid disorders: Secondary | ICD-10-CM

## 2022-08-22 DIAGNOSIS — Z131 Encounter for screening for diabetes mellitus: Secondary | ICD-10-CM

## 2022-08-22 DIAGNOSIS — Z0001 Encounter for general adult medical examination with abnormal findings: Secondary | ICD-10-CM

## 2022-08-22 DIAGNOSIS — Z8262 Family history of osteoporosis: Secondary | ICD-10-CM | POA: Diagnosis not present

## 2022-08-22 LAB — CBC WITH DIFFERENTIAL/PLATELET
Basophils Absolute: 0.1 10*3/uL (ref 0.0–0.1)
Basophils Relative: 0.7 % (ref 0.0–3.0)
Eosinophils Absolute: 0.2 10*3/uL (ref 0.0–0.7)
Eosinophils Relative: 2.5 % (ref 0.0–5.0)
HCT: 38.6 % (ref 36.0–46.0)
Hemoglobin: 13.3 g/dL (ref 12.0–15.0)
Lymphocytes Relative: 35.4 % (ref 12.0–46.0)
Lymphs Abs: 2.7 10*3/uL (ref 0.7–4.0)
MCHC: 34.5 g/dL (ref 30.0–36.0)
MCV: 91.9 fl (ref 78.0–100.0)
Monocytes Absolute: 0.5 10*3/uL (ref 0.1–1.0)
Monocytes Relative: 7 % (ref 3.0–12.0)
Neutro Abs: 4.1 10*3/uL (ref 1.4–7.7)
Neutrophils Relative %: 54.4 % (ref 43.0–77.0)
Platelets: 271 10*3/uL (ref 150.0–400.0)
RBC: 4.2 Mil/uL (ref 3.87–5.11)
RDW: 13.6 % (ref 11.5–15.5)
WBC: 7.5 10*3/uL (ref 4.0–10.5)

## 2022-08-22 LAB — COMPREHENSIVE METABOLIC PANEL
ALT: 27 U/L (ref 0–35)
AST: 29 U/L (ref 0–37)
Albumin: 4.4 g/dL (ref 3.5–5.2)
Alkaline Phosphatase: 54 U/L (ref 39–117)
BUN: 13 mg/dL (ref 6–23)
CO2: 23 mEq/L (ref 19–32)
Calcium: 9.6 mg/dL (ref 8.4–10.5)
Chloride: 105 mEq/L (ref 96–112)
Creatinine, Ser: 0.64 mg/dL (ref 0.40–1.20)
GFR: 101.95 mL/min (ref >60.00)
Glucose, Bld: 105 mg/dL — ABNORMAL HIGH (ref 70–99)
Potassium: 3.9 mEq/L (ref 3.5–5.1)
Sodium: 141 mEq/L (ref 135–145)
Total Bilirubin: 0.4 mg/dL (ref 0.2–1.2)
Total Protein: 7.2 g/dL (ref 6.0–8.3)

## 2022-08-22 LAB — LIPID PANEL
Cholesterol: 174 mg/dL (ref 0–200)
HDL: 45.5 mg/dL (ref >39.00)
NonHDL: 128.28
Total CHOL/HDL Ratio: 4
Triglycerides: 218 mg/dL — ABNORMAL HIGH (ref 0.0–149.0)
VLDL: 43.6 mg/dL — ABNORMAL HIGH (ref 0.0–40.0)

## 2022-08-22 LAB — VITAMIN D 25 HYDROXY (VIT D DEFICIENCY, FRACTURES): VITD: 49.43 ng/mL (ref 30.00–100.00)

## 2022-08-22 LAB — HEMOGLOBIN A1C: Hgb A1c MFr Bld: 6.2 % (ref 4.6–6.5)

## 2022-08-22 LAB — LDL CHOLESTEROL, DIRECT: Direct LDL: 100 mg/dL

## 2022-08-22 LAB — TSH: TSH: 1.35 u[IU]/mL (ref 0.35–5.50)

## 2022-08-22 NOTE — Progress Notes (Signed)
Phone [phone]  Routine Medical Office Visit  Date:    08/22/2022   Patient Name:  Debbie [name]  Healthcare Provider:  Lula Olszewski, MD   Chief Complaint / Reason For Visit:   Chief Complaint  Patient presents with   Annual Exam     Assessment / Plan:   Debbie [name] was seen today for annual exam.  Seasonal depression (HCC) Overview: Uses tanning beds/sunlight with sunblock.  Orders: -     Comprehensive metabolic panel -     TSH -     Hemoglobin A1c -     CBC with Differential/Platelet -     VITAMIN D 25 Hydroxy (Vit-D Deficiency, Fractures)  Family history of osteoporosis -     VITAMIN D 25 Hydroxy (Vit-D Deficiency, Fractures)  Other fatigue -     Comprehensive metabolic panel -     TSH -     Hemoglobin A1c -     CBC with Differential/Platelet -     VITAMIN D 25 Hydroxy (Vit-D Deficiency, Fractures)  Encounter for annual general medical examination with abnormal findings in adult  Morbid obesity (HCC) Overview: Wt Readings from Last 3 Encounters:  02/08/22 239 lb 6.4 oz (108.6 kg)  11/08/21 236 lb 6.4 oz (107.2 kg)  08/18/21 239 lb (108.4 kg)  On ozempic 2 but feels like clothes are doing much better.  Orders: -     TSH -     Hemoglobin A1c -     Lipid panel  Other orders -     LDL cholesterol, direct    Today's preventive care visit included comprehensive health maintenance evaluation and gap closure alongside extensive anticipatory guidance, as follows: #  Eye exams: recommended every 1-2 years.   She voiced understanding and intent to adhere to that plan. #  Dental health: recommended regular tooth brushing, flossing, and dental visits every 6 months.  She voiced understanding and intent to adhere to that plan. #  Sinus health: nightly SimplySaline or similar recommended now.  She voiced understanding and intent to adhere to that plan. #  Diet/Exercise:   recommended regular exercise (150 min) and diet rich and fruits and vegetables and fiber and  healthy fats to reduce risk of heart attack and stroke. She voiced understanding and intent to adhere to this plan. #  Sexuality: recommended STD prevention via partner selection & condoms.  Offered contraception / STD checks / genital wart treatment if appropriate. #  Cervical cancer screening: Recommended pap smear with hpv every 5 yrs from 25-65.  Last done 2023. Offered gynecology referral or completion here in accordance with patient preferences. #  Breast cancer screening:   encouraged compliance with recommended screening guidelines.  last breast exam- not doing I encourage against guidelines to really read about how to do it properly and last mammogram next month planned #  Uterine/ovarian cancer screening:  discussed current lack of screening guidelines or insurance support for testing.  Explained signs of endometrial cancer are irregular bleeding, postmenopausal bleeding, or pelvic pain.  Signs of ovarian cancer might be a frequent swollen tummy or feeling bloated. pain or tenderness in your tummy or the area between the hips (pelvis), no appetite or feeling full quickly after eating, or an urgent need to pee or needing to pee more often. #  Thyroid cancer screening:  discussed need to palpate thyroid about every 6 months for nodules #  Colon cancer screening:  denies family history of colon cancer or any GI bleeding,  last year had colonoscopy, due in 7 years.  #  Skin cancer screening: recommended regular sunscreen use. she denies worrisome, changing, or new skin lesions. Already does yearly checks with a dermatology. #  Osteoporosis prevention:  recommended to maintain a good source of calcium and vitamin D in diet.  She denies any personal history of early osteoporosis or fragility fractures- great grandmother on moms side had in 36s. #  Substance use: recommended absolute abstinence from all vaped or smoked recreational or illicit substances of abuse such as tobacco, nicotine, alcohol, illicit  drugs, even sugar.  #  Safety:  recommended avoiding high risk activities, wear seat belts, use smoke detectors #  Health maintenance and immunizations reviewed and she was encouraged to complete any incomplete and release any missing immunization records for [place] Immunization History  Administered Date(s) Administered   Influenza, High Dose Seasonal PF 05/26/2016, 01/02/2019, 02/19/2020   Influenza,inj,Quad PF,6+ Mos 12/18/2018, 12/27/2019   Influenza-Unspecified 12/18/2018, 12/22/2018, 12/27/2019, 02/26/2021, 04/02/2022   PFIZER(Purple Top)SARS-COV-2 Vaccination 06/08/2019, 06/29/2019, 07/22/2019, 01/17/2020, 02/19/2020   Tdap 02/08/2019   Zoster Recombinat (Shingrix) 10/07/2020, 12/22/2020  #  We attempted to update vaccination records and provide any missing vaccines that are recommended. There are no preventive care reminders to display for this patient.  # Incomplete health maintenance issues listed above were brought up for discussion and she was encouraged to complete with our assistance. # Recommended follow up:  continued annual preventive health maintenance exams Offered new hepatitis B vaccination (now for everyone over 19)    Subjective Clinical Information:   Debbie [name] is a 52 y.o. female who presents today for her annual comprehensive physical exam.   She has the following specific preventive medicine concerns for today: none She has the following specific medical problems she would like to discuss: none  History of Present Illness The patient, with a history of allergies, presented for an annual exam. She reported no new health issues since the last visit. The patient has been compliant with recommended preventive care, including regular skin cancer screenings due to a history of tanning bed use and sun exposure. She also reported receiving a pneumonia vaccine due to her allergy history.  The patient underwent an MRI due to episodes of dizziness and vertigo, suspected to  be related to an abnormal right ear physical exam finding. She also reported a family history of osteoporosis, with her great grandmother diagnosed in her 70s.  The patient has been proactive in managing her health, including regular dental and eye exams, a diet rich in fruits, vegetables, fiber, and healthy fats, and regular exercise. She participates in water aerobics, with a focus on resistance training for muscle growth.  The patient has been up-to-date with cervical cancer screening and is scheduled for a mammogram. She also underwent a colonoscopy last year, which was negative. The patient reported no need for sexually transmitted infection discussions, contraception, or treatments.  The patient has been compliant with recommended vaccinations and has no special risk factors requiring additional vaccinations. She reported no substance use and no high-risk activities. She has been diligent in using sunblock during sun exposure.  The patient's preventive care is up-to-date, with no identified gaps. She has been proactive in managing her health and adhering to recommended screenings and vaccinations.   Assessment & Plan Annual Physical Exam: Comprehensive annual exam conducted with discussion on preventive care measures including eye and dental exams, sinus rinsing, diet, exercise, sexually transmitted infection, cervical cancer screening, mammogram, uterine and ovarian  cancer awareness, thyroid cancer screening, colon cancer screening, skin cancer screening, osteoporosis risk, and vaccination status. -Continue current preventive care measures. -Consider transvaginal ultrasound or MRI pelvis if any symptoms of uterine or ovarian cancer symptoms develop. -Consider switching to Cologuard for colon cancer screening at next due date. -Check Vitamin D levels due to family history of osteoporosis. -Continue annual skin cancer screening with dermatologist.  Dizziness and Vertigo: MRI conducted to rule  out cerebellar issues. No obvious abnormalities noted on initial review. Awaiting radiologist report. -Will post radiologist report to patient's chart once received.  Lab Work: Comprehensive lab work ordered including Vitamin D, cholesterol, blood count, A1c, metabolic panel, and thyroid. -Complete lab work as ordered.  Follow-up: Schedule next annual exam at checkout.    Review of Systems  Constitutional:  Negative for chills, diaphoresis, fever, malaise/fatigue and weight loss.  HENT:  Negative for congestion, ear discharge, ear pain, hearing loss, nosebleeds, sinus pain, sore throat and tinnitus.   Eyes:  Negative for blurred vision, double vision, photophobia, pain, discharge and redness.  Respiratory:  Negative for cough, hemoptysis, sputum production, shortness of breath, wheezing and stridor.   Cardiovascular:  Negative for chest pain, palpitations, orthopnea, claudication, leg swelling and PND.  Gastrointestinal:  Negative for abdominal pain, blood in stool, constipation, diarrhea, heartburn, melena, nausea and vomiting.  Genitourinary:  Negative for dysuria, flank pain, frequency, hematuria and urgency.  Musculoskeletal:  Negative for back pain, falls, joint pain, myalgias and neck pain.  Skin:  Negative for itching and rash.  Neurological:  Negative for dizziness, tingling, tremors, sensory change, speech change, focal weakness, seizures, loss of consciousness, weakness and headaches.  Endo/Heme/Allergies:  Negative for environmental allergies and polydipsia. Does not bruise/bleed easily.  Psychiatric/Behavioral:  Negative for depression, hallucinations, memory loss, substance abuse and suicidal ideas. The patient is not nervous/anxious and does not have insomnia.     During today's preventive visit, the patient has expressed that any positive findings in the review of systems are related to chronic conditions that are stable.  The patient has requested that these issues not be  addressed in this visit, with the understanding that they are part of ongoing management and do not require additional workup at this time.  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   Weakness on right side of face 07/11/2022   Hypertriglyceridemia 02/08/2022   Other chronic sinusitis 02/08/2022   Upper abdominal pain 08/18/2021   Itching of ear 08/18/2021   History of endometrial ablation 05/20/2021   Hypertension 02/20/2019   Morbid obesity (HCC) 02/08/2019   Hypothyroidism    Hyperlipidemia    Seasonal allergies    Past Medical History:  Diagnosis Date   Acute renal failure (ARF) (HCC) 10/01/2015   Allergy    Depression    Elevated cholesterol    History of endometrial ablation 05/20/2021   2021   Hyperlipidemia    Hypertension    Hypertriglyceridemia 02/08/2022   No components found for: "TG" Lab Results Component Value Date/Time  TRIG 125.0 08/18/2021 08:53 AM  TRIG 163.0 (H) 12/29/2020 09:20 AM  TRIG 148.0 08/04/2020 08:24 AM  TRIG 177.0 (H) 04/03/2019 07:30 AM     Hypothyroidism    Insomnia 11/08/2021   Morbid obesity (HCC) 02/08/2019   Seasonal allergies    Sleep apnea    wears cpap  Thyroid disease    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 Irena Cords     Past Surgical History:  Procedure Laterality Date   ABLATION  10/2019   ADENOIDECTOMY     COLONOSCOPY     DENTAL SURGERY     graft of mouth    Family History  Problem Relation Age of Onset   Heart disease Father    Hypertension Mother    Hyperlipidemia Mother    Breast cancer Maternal Grandmother    Prostate cancer Maternal Grandfather    Diabetes Maternal Grandfather    CAD Maternal Grandfather    Colon cancer Neg Hx    Colon polyps Neg Hx    Esophageal  cancer Neg Hx    Stomach cancer Neg Hx    Rectal cancer Neg Hx    Outpatient Medications Prior to Visit  Medication Sig Dispense Refill   albuterol (VENTOLIN HFA) 108 (90 Base) MCG/ACT inhaler 1 PUFF AS NEEDED EVERY 4 HOURS FOR COUGH/WHEEZE INHALATION 90 DAYS     Ascorbic Acid (VITAMIN C) 1000 MG tablet Take 1,000 mg by mouth daily.     Azelastine-Fluticasone 137-50 MCG/ACT SUSP PLACE 1 SPRAY INTO THE NOSE EVERY 12 (TWELVE) HOURS. 23 g 1   BIOTIN PO Take 1 tablet by mouth daily.     cetirizine (ZYRTEC) 10 MG tablet Take 10 mg by mouth daily.     Cholecalciferol (D3-1000 PO) Take 1,000 Units by mouth daily.     Cyanocobalamin (VITAMIN B12 PO) Take by mouth daily.     escitalopram (LEXAPRO) 20 MG tablet Take 1 tablet (20 mg total) by mouth daily. 90 tablet 3   hydrochlorothiazide (HYDRODIURIL) 25 MG tablet      ibuprofen (ADVIL) 600 MG tablet Take 600 mg by mouth every 8 (eight) hours as needed for cramping, moderate pain, mild pain, headache or fever.     levothyroxine (SYNTHROID) 100 MCG tablet Take 1 tablet (100 mcg total) by mouth daily. 90 tablet 3   losartan-hydrochlorothiazide (HYZAAR) 50-12.5 MG tablet Take 1 tablet by mouth daily. Ok to start with just half tablet daily for week 1 and leave it there if goal of 140/90 reached. 90 tablet 3   meclizine (ANTIVERT) 25 MG tablet Take 1 tablet (25 mg total) by mouth 3 (three) times daily as needed for dizziness. 90 tablet 2   montelukast (SINGULAIR) 10 MG tablet Take 1 tablet by mouth every evening.  5   Multiple Vitamins-Minerals (HAIR/SKIN/NAILS) CAPS Take 1 capsule by mouth daily.     Multiple Vitamins-Minerals (MULTIVITAMIN WOMEN 50+) TABS Take 1 tablet by mouth 1 day or 1 dose.     NEOMYCIN-POLYMYXIN-HYDROCORTISONE (CORTISPORIN) 1 % SOLN OTIC solution Place 1-3 drops into both ears every 8 (eight) hours as needed. 10 mL 0   OZEMPIC, 2 MG/DOSE, 8 MG/3ML SOPN Inject 2 mg into the skin once a week.     rosuvastatin (CRESTOR) 20 MG tablet  Take 1 tablet (20 mg total) by mouth daily. 90 tablet 3   topiramate (TOPAMAX) 25 MG tablet Take 1 tablet (25 mg total) by mouth 2 (two) times daily. Take just one tablet daily for the first week. 180 tablet 3   Zinc 50 MG TABS Take by mouth daily.     No facility-administered medications prior to visit.   I attest the medication list was reviewed and reconciled with her in accordance with the requirement. Allergies  Allergen Reactions   Atorvastatin Hives  Social History   Tobacco Use   Smoking status: Never   Smokeless tobacco: Never  Vaping Use   Vaping Use: Never used  Substance Use Topics   Alcohol use: No   Drug use: No        Objective:  BP 130/82 (BP Location: Left Arm, Patient Position: Sitting)   Pulse 68   Temp 97.9 F (36.6 C) (Temporal)   Ht 5\' 4"  (1.626 m)   Wt 246 lb (111.6 kg)   SpO2 97%   BMI 42.23 kg/m  Body mass index is 42.23 kg/m.  Wt Readings from Last 3 Encounters:  08/22/22 246 lb (111.6 kg)  07/27/22 241 lb 4.8 oz (109.5 kg)  07/11/22 240 lb (108.9 kg)    This is a polite, friendly, and genuine person Constitutional: NAD, AAO, not ill-appearing  Eyes:  sclera nonicteric, no injection HEENT: Ears clear with scarring and opacification of the right tympanic membrane. Teeth in decent condition with a cavity on the left upper molar. Mild allergic inflammation in the nose, airways patent. CHEST: Lungs clear to auscultation, normal work of breathing. CARDIOVASCULAR: Heart sounds regular rhythm, no murmurs. ABDOMEN: No abdominal tenderness or mass. MUSCULOSKELETAL: Spinal alignment good, no pain or tenderness over the costovertebral angle. Difficulty obtaining patellar reflexes. NEUROLOGICAL: Overall physical exam normal. SKIN: Healthy appearing skin with a lot of moles.     Patient follows separately with gynecology for breast and gynecological exam at another office which  I supported and encouraged explaining the importance of routine  gynecological screenings.

## 2022-08-22 NOTE — Patient Instructions (Addendum)
It was a pleasure seeing you today! Your health and satisfaction are our top priorities.   Glenetta Hew, MD  Next Steps:  [x]  Early Intervention: Schedule sooner appointment, call our on-call services, or go to emergency room if there is Increase in pain or discomfort New or worsening symptoms Sudden or severe changes in your health [x]  Flexible Follow-Up: We recommend a Return in about 1 year (around 08/22/2023) for annual preventive care visit. for optimal routine care. This allows for progress monitoring and treatment adjustments. [x]  Preventive Care: Schedule your annual preventive care visit! It's typically covered by insurance and helps identify potential health issues early. [x]  Lab & X-ray Appointments: Incomplete tests scheduled today, or call to schedule. X-rays: [facility] (M-F, 8:30am-noon or 1pm-5pm). [x]  Medical Information Release: Sign a release form at front desk to obtain relevant medical information we don't have.  Making the Most of Our Focused (20 minute) Appointments:  [x]   Clearly state your top concerns at the beginning of the visit to focus our discussion [x]   If you anticipate you will need more time, please inform the front desk during scheduling - we can book multiple appointments in the same week. [x]   If you have transportation problems- use our convenient video appointments or ask about transportation support. [x]   We can get down to business faster if you use MyChart to update information before the visit and submit non-urgent questions before your visit. Thank you for taking the time to provide details through MyChart.  Let our nurse know and she can import this information into your encounter documents.  Arrival and Wait Times: [x]   Arriving on time ensures that everyone receives prompt attention. [x]   Early morning (8a) and afternoon (1p) appointments tend to have shortest wait times. [x]   Unfortunately, we cannot delay appointments for late  arrivals or hold slots during phone calls.  Getting Answers and Following Up  [x]   Simple Questions & Concerns: For quick questions or basic follow-up after your visit, reach [place] at [phone] or MyChart messaging. [x]   Complex Concerns: If your concern is more complex, scheduling an appointment might be best. Discuss this with the staff to find the most suitable option. [x]   Lab & Imaging Results: We'll contact you directly if results are abnormal or you don't use MyChart. Most normal results will be on MyChart within 2-3 business days, with a review message from Dr. Jon Billings. Haven't heard back in 2 weeks? Need results sooner? Contact us at [phone]. [x]   Referrals: Our referral coordinator will manage specialist referrals. The specialist's office should contact you within 2 weeks to schedule an appointment. Call us if you haven't heard from them after 2 weeks.  Staying Connected  [x]   MyChart: Activate your MyChart for the fastest way to access results and message [place]. See the last page of this paperwork for instructions on how to activate.  Bring to Your Next Appointment  [x]   Medications: Please bring all your medication bottles to your next appointment to ensure we have an accurate record of your prescriptions. [x]   Health Diaries: If you're monitoring any health conditions at home, keeping a diary of your readings can be very helpful for discussions at your next appointment.  Billing  [x]   X-ray & Lab Orders: These are billed by separate companies. Contact the invoicing company directly for questions or concerns. [x]   Visit Charges: Discuss any billing inquiries with our administrative services team.  Your Satisfaction Matters  [x]   Share Your Experience: We strive for your satisfaction! If you have any complaints, or preferably compliments, please let Dr. Jon Billings know directly or contact our Practice Administrators, Edwena Felty or [employer], by asking at the front  desk.   Reviewing Your Records  [x]   Review this early draft of your clinical encounter notes below and the final encounter summary tomorrow on MyChart after its been completed.   Seasonal depression (HCC) -     Comprehensive metabolic panel -     TSH -     Hemoglobin A1c -     CBC with Differential/Platelet -     VITAMIN D 25 Hydroxy (Vit-D Deficiency, Fractures)  Family history of osteoporosis -     VITAMIN D 25 Hydroxy (Vit-D Deficiency, Fractures)  Other fatigue -     Comprehensive metabolic panel -     TSH -     Hemoglobin A1c -     CBC with Differential/Platelet -     VITAMIN D 25 Hydroxy (Vit-D Deficiency, Fractures)  Encounter for annual general medical examination with abnormal findings in adult  Morbid obesity (HCC) -     TSH -     Hemoglobin A1c -     Lipid panel  Other orders -     LDL cholesterol, direct    VISIT SUMMARY:  During your annual exam, we discussed your overall health and preventive care measures. You have been proactive in managing your health, adhering to recommended screenings and vaccinations, and maintaining a healthy lifestyle. We also discussed your episodes of dizziness and vertigo, and an MRI was conducted to rule out any issues. Comprehensive lab work was ordered, including tests for Vitamin D, cholesterol, blood count, A1c, metabolic panel, and thyroid.  YOUR PLAN:  -ANNUAL PHYSICAL EXAM: Your annual physical exam was comprehensive and included discussions on preventive care measures. You should continue your current preventive care measures. If you develop any symptoms of uterine or ovarian cancer, consider having a transvaginal ultrasound or MRI pelvis. For your next colon cancer screening, consider switching to Cologuard. Continue your annual skin cancer screening with your dermatologist.  -DIZZINESS AND VERTIGO: An MRI was conducted to rule out any issues related to your dizziness and vertigo. No obvious abnormalities were noted on  initial review. We are awaiting the radiologist's report, which will be posted to your chart once received.  -LAB WORK: Comprehensive lab work was ordered, including tests for Vitamin D, cholesterol, blood count, A1c, metabolic panel, and thyroid. Please complete these tests as ordered.  INSTRUCTIONS:  Please schedule your next annual exam at checkout. Also, remember to complete the lab work that was ordered. We will post the radiologist's report from your MRI to your chart once it is received.

## 2022-08-29 NOTE — Progress Notes (Signed)
The MRI scan showed a moderate thickening of the mucosal lining in the left sphenoid sinus. The sphenoid sinus is an air-filled cavity located behind the nose. Thickening of the mucosal lining can occur due to various reasons, including allergies, sinusitis, or infections.  Facial Droop:  The MRI scan did not reveal any abnormalities that could directly explain the drooping on the right side of your face. Bell's palsy is still a possibility, though further evaluation may be needed.  Next Steps:  We recommend scheduling an appointment to discuss these results and determine the next steps in managing your facial droop. During the appointment, we can review your medical history and discuss additional tests that may be helpful.  If desired, we could refer to a neurological specialist without an appointment.

## 2022-08-31 ENCOUNTER — Other Ambulatory Visit (HOSPITAL_BASED_OUTPATIENT_CLINIC_OR_DEPARTMENT_OTHER): Payer: Self-pay

## 2022-08-31 DIAGNOSIS — Z1231 Encounter for screening mammogram for malignant neoplasm of breast: Secondary | ICD-10-CM

## 2022-09-02 ENCOUNTER — Ambulatory Visit (HOSPITAL_BASED_OUTPATIENT_CLINIC_OR_DEPARTMENT_OTHER)
Admission: RE | Admit: 2022-09-02 | Discharge: 2022-09-02 | Disposition: A | Discharge status: Home / Self Care | Payer: 59 | Source: Ambulatory Visit | Attending: Obstetrics and Gynecology | Admitting: Obstetrics and Gynecology

## 2022-09-02 DIAGNOSIS — Z1231 Encounter for screening mammogram for malignant neoplasm of breast: Secondary | ICD-10-CM | POA: Insufficient documentation

## 2022-10-12 ENCOUNTER — Other Ambulatory Visit: Payer: Self-pay | Admitting: Internal Medicine

## 2022-10-12 DIAGNOSIS — I10 Essential (primary) hypertension: Secondary | ICD-10-CM

## 2022-10-27 ENCOUNTER — Other Ambulatory Visit: Payer: Self-pay

## 2022-10-27 ENCOUNTER — Encounter: Payer: Self-pay | Admitting: Internal Medicine

## 2022-10-27 DIAGNOSIS — I10 Essential (primary) hypertension: Secondary | ICD-10-CM

## 2022-12-20 ENCOUNTER — Other Ambulatory Visit: Payer: Self-pay | Admitting: Internal Medicine

## 2022-12-20 DIAGNOSIS — E039 Hypothyroidism, unspecified: Secondary | ICD-10-CM

## 2022-12-20 DIAGNOSIS — F3342 Major depressive disorder, recurrent, in full remission: Secondary | ICD-10-CM

## 2022-12-20 DIAGNOSIS — E785 Hyperlipidemia, unspecified: Secondary | ICD-10-CM

## 2023-01-16 ENCOUNTER — Ambulatory Visit: Payer: 59 | Admitting: Internal Medicine

## 2023-01-16 ENCOUNTER — Encounter: Payer: Self-pay | Admitting: Internal Medicine

## 2023-01-16 VITALS — BP 140/90 | HR 67 | Temp 98.0°F | Ht 64.0 in | Wt 251.4 lb

## 2023-01-16 DIAGNOSIS — H66006 Acute suppurative otitis media without spontaneous rupture of ear drum, recurrent, bilateral: Secondary | ICD-10-CM | POA: Diagnosis not present

## 2023-01-16 DIAGNOSIS — G4733 Obstructive sleep apnea (adult) (pediatric): Secondary | ICD-10-CM

## 2023-01-16 DIAGNOSIS — Q670 Congenital facial asymmetry: Secondary | ICD-10-CM

## 2023-01-16 DIAGNOSIS — J324 Chronic pansinusitis: Secondary | ICD-10-CM

## 2023-01-16 DIAGNOSIS — J029 Acute pharyngitis, unspecified: Secondary | ICD-10-CM

## 2023-01-16 DIAGNOSIS — Z6841 Body Mass Index (BMI) 40.0 and over, adult: Secondary | ICD-10-CM

## 2023-01-16 MED ORDER — PSEUDOEPHEDRINE HCL ER 120 MG PO TB12: 120.0000 mg | ORAL_TABLET | Freq: Two times a day (BID) | ORAL | 0 refills | Status: DC

## 2023-01-16 MED ORDER — LEVOCETIRIZINE DIHYDROCHLORIDE 5 MG PO TABS: 5.0000 mg | ORAL_TABLET | Freq: Every evening | ORAL | 2 refills | Status: DC

## 2023-01-16 MED ORDER — AMOXICILLIN-POT CLAVULANATE 875-125 MG PO TABS: 1.0000 | ORAL_TABLET | Freq: Two times a day (BID) | ORAL | 0 refills | Status: DC

## 2023-01-16 MED ORDER — SIMPLY SALINE 0.9 % NA AERS
2.0000 | INHALATION_SPRAY | NASAL | 11 refills | Status: DC
Start: 2023-01-16 — End: 2023-03-30

## 2023-01-16 MED ORDER — OZEMPIC (0.25 OR 0.5 MG/DOSE) 2 MG/3ML ~~LOC~~ SOPN: 0.5000 mg | PEN_INJECTOR | SUBCUTANEOUS | 2 refills | Status: DC

## 2023-01-16 NOTE — Progress Notes (Signed)
[facility]: [phone]   -- Medical Office Visit --  Patient:  Debbie [name]      Age: 52 y.o.       Sex:  female  Date:   01/16/2023 Today's Healthcare Provider: Lula Olszewski, MD  ============================================================================================= Assessment Plan    Assessment & Plan Recurrent acute suppurative otitis media without spontaneous rupture of tympanic membrane of both sides She presents with acute bilateral otitis media, likely induced by allergies with a bacterial superinfection, evidenced by pus behind the tympanic membrane. She has a history of chronic sinus issues and frequent infections. We will prescribe Augmentin for the bacterial infection and advise her to continue her current allergy medications, Singulair and Zyrtec. Additionally, we recommend Xyzal as a more potent alternative to Zyrtec and advise the use of Sudafed to aid in ear drainage.  Chronic pansinusitis Her chronic sinus issues are likely contributing to frequent ear infections and possible facial nerve damage, with a small nasal polyp noted. She should continue her current nasal spray, consider the use of a nasal dilator to improve breathing, especially during sleep with her CPAP machine, and continue follow-up with an ENT and allergist.  Morbid obesity with BMI of 40.0-44.9, adult (HCC) She was previously on Ozempic for weight loss but discontinued it due to severe side effects and is currently on Topiramate 25mg  BID for weight loss. We will restart Ozempic at 0.25mg  for the first few weeks, then increase to 0.5mg  and prescribe a 86-month supply. We encourage the continuation of physical activity, including gym workouts and mowing the lawn, and consider monthly follow-ups to monitor weight loss progress, keeping the option for bariatric surgery open if weight loss is not achieved with current medications.  Morbid obesity (HCC)  Sore throat  OSA on  CPAP She is using a CPAP machine but having difficulty due to chronic sinus issues. She should continue the use of the CPAP machine, consider the use of a nasal dilator to improve breathing during sleep, and consider an ENT evaluation for possible sinus surgery to improve symptoms.  Facial asymmetry Her facial weakness is likely due to chronic sinus and ear inflammation and infection, with no evidence of stroke on previous MRI. No specific plan was discussed; continue monitoring.      Diagnoses and all orders for this visit: Recurrent acute suppurative otitis media without spontaneous rupture of tympanic membrane of both sides -     Saline (SIMPLY SALINE) 0.9 % AERS; Place 2 each into the nose as directed. Use nightly for sinus hygiene long-term.  Can also be used as many times daily as desired to assist with clearing congested sinuses. -     pseudoephedrine (SUDAFED 12 HOUR) 120 MG 12 hr tablet; Take 1 tablet (120 mg total) by mouth 2 (two) times daily. -     amoxicillin-clavulanate (AUGMENTIN) 875-125 MG tablet; Take 1 tablet by mouth 2 (two) times daily. -     levocetirizine (XYZAL) 5 MG tablet; Take 1 tablet (5 mg total) by mouth every evening. Chronic pansinusitis -     amoxicillin-clavulanate (AUGMENTIN) 875-125 MG tablet; Take 1 tablet by mouth 2 (two) times daily. Morbid obesity with BMI of 40.0-44.9, adult (HCC) -     Semaglutide,0.25 or 0.5MG /DOS, (OZEMPIC, 0.25 OR 0.5 MG/DOSE,) 2 MG/3ML SOPN; Inject 0.5 mg into the skin once a week. Morbid obesity [facility])  Recommended follow-up: as needed  Future Appointments  Date Time Provider Department Center  08/29/2023  8:00 AM Lula Olszewski,  MD [facility]  Patient Care Team: Lula Olszewski, MD as PCP - General (Internal Medicine) Nahser, Deloris Ping, MD as PCP - Cardiology (Cardiology) Hermina Staggers, MD (Inactive) as Consulting Physician (Obstetrics and Gynecology) Marcelino Duster, MD as Referring Physician (Dermatology)     Medical Decision Making: 2 or more stable chronic illnesses Prescription drug management     Subjective   52 y.o. female who has Hypothyroidism; Hyperlipidemia; Seasonal allergies; Morbid obesity (HCC); Hypertension; History of endometrial ablation; Upper abdominal pain; Itching of ear; Hypertriglyceridemia; Other chronic sinusitis; Vertigo; Tympanosclerosis; Weakness on right side of face; and Seasonal depression (HCC) on their problem list.. Main reasons for visit/main concerns/chief complaint: Cough, Sore Throat, and Ear Pain   ------------------------------------------------------------------------------------------------------------------------ AI-Extracted: Discussed the use of AI scribe software for clinical note transcription with the patient, who gave verbal consent to proceed.  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 The cold is characterized by a sore throat and ear pain, which the patient reports as a common symptom when she falls ill. The patient woke up on the day of the consultation with a significantly worse sore throat. The patient has a history of chronic sinus issues, which she believes may be contributing to her frequent infections.  The patient also reports a history of hearing loss due to frequent infections, but a recent consultation with an ear, nose, and throat specialist revealed no hearing loss. The patient does not report any ringing in the ears. The patient's nasal discharge is clear, but she reports pain in the ears. The patient has been mowing the grass weekly, which she believes may have triggered her allergies and contributed to her current symptoms.  In addition to her sinus and ear issues, the patient has been dealing with facial weakness, which she believes may be due to chronic inflammation and infection in her ears  and sinuses. The patient has had an MRI in the past, which did not reveal any signs of a stroke.  The patient also reports a struggle with weight loss. She has been taking topiramate and was previously on Ozempic, but had to stop due to severe nausea. The patient has joined a gym and is interested in exploring other weight loss medications. She has not been diagnosed with diabetes, but has been prediabetic for a long time.  The patient uses a CPAP machine for sleep apnea, but reports having problems with it due to her chronic sinus issues. She has seen an allergist and an ear, nose, and throat specialist for her chronic sinus problems, but has not yet reached the point of needing sinus surgery. The patient is interested in exploring other options for managing her sinus issues, including the use of a nasal dilator.       Note that patient  has a past medical history of Acute renal failure (ARF) (HCC) (10/01/2015), Allergy, Depression, Elevated cholesterol, History of endometrial ablation (05/20/2021), Hyperlipidemia, Hypertension, Hypertriglyceridemia (02/08/2022), Hypothyroidism, Insomnia (11/08/2021), Morbid obesity (HCC) (02/08/2019), Seasonal allergies, Sleep apnea, Thyroid disease, and Tympanosclerosis (07/11/2022).  Problem list overviews that were updated at today's visit:No problems updated.  Med reconciliation: Current Outpatient Medications on File Prior to Visit  Medication Sig   albuterol (VENTOLIN HFA) 108 (90 Base) MCG/ACT inhaler 1 PUFF AS NEEDED EVERY 4 HOURS FOR COUGH/WHEEZE INHALATION 90 DAYS   Ascorbic Acid (VITAMIN C) 1000 MG tablet Take 1,000  mg by mouth daily.   Azelastine-Fluticasone 137-50 MCG/ACT SUSP PLACE 1 SPRAY INTO THE NOSE EVERY 12 (TWELVE) HOURS.   BIOTIN PO Take 1 tablet by mouth daily.   cetirizine (ZYRTEC) 10 MG tablet Take 10 mg by mouth daily.   Cholecalciferol (D3-1000 PO) Take 1,000 Units by mouth daily.   Cyanocobalamin (VITAMIN B12 PO) Take by mouth daily.    escitalopram (LEXAPRO) 20 MG tablet TAKE 1 TABLET BY MOUTH EVERY DAY   hydrochlorothiazide (HYDRODIURIL) 25 MG tablet    ibuprofen (ADVIL) 600 MG tablet Take 600 mg by mouth every 8 (eight) hours as needed for cramping, moderate pain, mild pain, headache or fever.   levothyroxine (SYNTHROID) 100 MCG tablet TAKE 1 TABLET BY MOUTH EVERY DAY   losartan-hydrochlorothiazide (HYZAAR) 50-12.5 MG tablet TAKE 1 TABLET BY MOUTH DAILY. OK TO START WITH JUST HALF TABLET DAILY FOR WEEK 1 AND LEAVE IT THERE IF GOAL OF 140/90 REACHED.   meclizine (ANTIVERT) 25 MG tablet Take 1 tablet (25 mg total) by mouth 3 (three) times daily as needed for dizziness.   montelukast (SINGULAIR) 10 MG tablet Take 1 tablet by mouth every evening.   Multiple Vitamins-Minerals (HAIR/SKIN/NAILS) CAPS Take 1 capsule by mouth daily.   Multiple Vitamins-Minerals (MULTIVITAMIN WOMEN 50+) TABS Take 1 tablet by mouth 1 day or 1 dose.   NEOMYCIN-POLYMYXIN-HYDROCORTISONE (CORTISPORIN) 1 % SOLN OTIC solution Place 1-3 drops into both ears every 8 (eight) hours as needed.   rosuvastatin (CRESTOR) 20 MG tablet TAKE 1 TABLET BY MOUTH EVERY DAY   topiramate (TOPAMAX) 25 MG tablet TAKE 1 TABLET (25 MG TOTAL) BY MOUTH 2 (TWO) TIMES DAILY. TAKE JUST ONE TABLET DAILY FOR THE FIRST WEEK.   Zinc 50 MG TABS Take by mouth daily.   No current facility-administered medications on file prior to visit.   Medications Discontinued During This Encounter  Medication Reason   OZEMPIC, 2 MG/DOSE, 8 MG/3ML SOPN      Objective   Physical Exam  BP (!) 140/90 (BP Location: Left Arm, Patient Position: Sitting)   Pulse 67   Temp 98 F (36.7 C) (Temporal)   Ht 5\' 4"  (1.626 m)   Wt 251 lb 6.4 oz (114 kg)   SpO2 98%   BMI 43.15 kg/m  Wt Readings from Last 10 Encounters:  01/16/23 251 lb 6.4 oz (114 kg)  08/22/22 246 lb (111.6 kg)  07/27/22 241 lb 4.8 oz (109.5 kg)  07/11/22 240 lb (108.9 kg)  02/08/22 239 lb 6.4 oz (108.6 kg)  11/08/21 236 lb 6.4 oz  (107.2 kg)  08/18/21 239 lb (108.4 kg)  06/11/21 234 lb (106.1 kg)  05/20/21 245 lb 12.8 oz (111.5 kg)  03/24/21 252 lb 3.2 oz (114.4 kg)   Vital signs reviewed.  Nursing notes reviewed. Weight trend reviewed. Abnormalities and Problem-Specific physical exam findings:  blood pressure noted, weight gain.  General Appearance:  No acute distress appreciable.   Well-groomed, healthy-appearing female.  Well proportioned with no abnormal fat distribution.  Good muscle tone. Pulmonary:  Normal work of breathing at rest, no respiratory distress apparent. SpO2: 98 %  Musculoskeletal: All extremities are intact.  Neurological:  Awake, alert, oriented, and engaged.  No obvious focal neurological deficits or cognitive impairments.  Sensorium seems unclouded.   Speech is clear and coherent with logical content. Psychiatric:  Appropriate mood, pleasant and cooperative demeanor, thoughtful and engaged during the exam  Results            No results found for  any visits on 01/16/23.  Office Visit on 08/22/2022  Component Date Value   Sodium 08/22/2022 141    Potassium 08/22/2022 3.9    Chloride 08/22/2022 105    CO2 08/22/2022 23    Glucose, Bld 08/22/2022 105 (H)    BUN 08/22/2022 13    Creatinine, Ser 08/22/2022 0.64    Total Bilirubin 08/22/2022 0.4    Alkaline Phosphatase 08/22/2022 54    AST 08/22/2022 29    ALT 08/22/2022 27    Total Protein 08/22/2022 7.2    Albumin 08/22/2022 4.4    GFR 08/22/2022 101.95    Calcium 08/22/2022 9.6    TSH 08/22/2022 1.35    Hgb A1c MFr Bld 08/22/2022 6.2    WBC 08/22/2022 7.5    RBC 08/22/2022 4.20    Hemoglobin 08/22/2022 13.3    HCT 08/22/2022 38.6    MCV 08/22/2022 91.9    MCHC 08/22/2022 34.5    RDW 08/22/2022 13.6    Platelets 08/22/2022 271.0    Neutrophils Relative % 08/22/2022 54.4    Lymphocytes Relative 08/22/2022 35.4    Monocytes Relative 08/22/2022 7.0    Eosinophils Relative 08/22/2022 2.5    Basophils Relative 08/22/2022 0.7     Neutro Abs 08/22/2022 4.1    Lymphs Abs 08/22/2022 2.7    Monocytes Absolute 08/22/2022 0.5    Eosinophils Absolute 08/22/2022 0.2    Basophils Absolute 08/22/2022 0.1    VITD 08/22/2022 49.43    Cholesterol 08/22/2022 174    Triglycerides 08/22/2022 218.0 (H)    HDL 08/22/2022 45.50    VLDL 08/22/2022 43.6 (H)    Total CHOL/HDL Ratio 08/22/2022 4    NonHDL 08/22/2022 128.28    Direct LDL 08/22/2022 100.0    No image results found.   No results found.  MM 3D SCREENING MAMMOGRAM BILATERAL BREAST  Result Date: 09/06/2022 CLINICAL DATA:  Screening. EXAM: DIGITAL SCREENING BILATERAL MAMMOGRAM WITH TOMOSYNTHESIS AND CAD TECHNIQUE: Bilateral screening digital craniocaudal and mediolateral oblique mammograms were obtained. Bilateral screening digital breast tomosynthesis was performed. The images were evaluated with computer-aided detection. COMPARISON:  Previous exam(s). ACR Breast Density Category b: There are scattered areas of fibroglandular density. FINDINGS: There are no findings suspicious for malignancy. IMPRESSION: No mammographic evidence of malignancy. A result letter of this screening mammogram will be mailed directly to the patient. RECOMMENDATION: Screening mammogram in one year. (Code:SM-B-01Y) BI-RADS CATEGORY  1: Negative. Electronically Signed   By: Emmaline Kluver M.D.   On: 09/06/2022 08:02       Additional Info: This encounter employed real-time, collaborative documentation. The patient actively reviewed and updated their medical record on a shared screen, ensuring transparency and facilitating joint problem-solving for the problem list, overview, and plan. This approach promotes accurate, informed care. The treatment plan was discussed and reviewed in detail, including medication safety, potential side effects, and all patient questions. We confirmed understanding and comfort with the plan. Follow-up instructions were established, including contacting the office for any  concerns, returning if symptoms worsen, persist, or new symptoms develop, and precautions for potential emergency department visits.

## 2023-01-16 NOTE — Patient Instructions (Signed)
VISIT SUMMARY:  During today's visit, we discussed your persistent cold, ear pain, and sore throat, which have been ongoing for three weeks. We also reviewed your chronic sinus issues, facial weakness, weight loss efforts, sleep apnea, and the difficulties you are experiencing with your CPAP machine. We have developed a comprehensive plan to address each of these concerns.  YOUR PLAN:  -ACUTE BILATERAL OTITIS MEDIA: This is an infection in both ears, likely caused by allergies and a bacterial infection. We will treat this with Augmentin and recommend continuing your allergy medications, Singulair and Zyrtec. Additionally, you may try Xyzal as a stronger alternative to Zyrtec and use Sudafed to help with ear drainage.  -CHRONIC SINUSITIS: This is a long-term inflammation of the sinuses, which may be causing your frequent ear infections and facial nerve issues. Continue using your nasal spray, consider a nasal dilator to help with breathing, especially when using your CPAP machine, and keep following up with your ENT and allergist.  -OBESITY: We discussed your weight loss journey and the side effects you experienced with Ozempic. We will restart Ozempic at a lower dose and gradually increase it. Continue your physical activities, including gym workouts and mowing the lawn. We will have monthly follow-ups to monitor your progress, and bariatric surgery remains an option if needed.  -SLEEP APNEA: This is a condition where your breathing stops and starts during sleep. Continue using your CPAP machine and consider a nasal dilator to improve breathing. An ENT evaluation for possible sinus surgery may also help improve your symptoms.  -FACIAL WEAKNESS: Your facial weakness is likely due to chronic inflammation and infection in your sinuses and ears. We will continue to monitor this condition.  INSTRUCTIONS:  Please follow up with [place] in one month to monitor your weight loss progress and the effectiveness of  your current treatments. Continue your current medications and consider the additional recommendations provided. If you experience any worsening symptoms or new concerns, contact our office immediately.

## 2023-01-19 ENCOUNTER — Telehealth: Payer: Self-pay | Admitting: Pharmacist

## 2023-01-19 NOTE — Telephone Encounter (Signed)
The patient does not have a diagnosis of Type 2 Diabetes and would not meet the criteria for Ozempic therapy. Therefore, prior authorization will not be submitted at this time.  If deemed clinically appropriate, may consider the possibility of therapy with Saxenda, Reginal Lutes or Zepbound.  Dellie Burns, PharmD Clinical Pharmacist [place]  Direct Dial: [phone]

## 2023-01-23 ENCOUNTER — Encounter (INDEPENDENT_AMBULATORY_CARE_PROVIDER_SITE_OTHER): Payer: 59 | Admitting: Internal Medicine

## 2023-01-23 DIAGNOSIS — J410 Simple chronic bronchitis: Secondary | ICD-10-CM

## 2023-01-24 ENCOUNTER — Other Ambulatory Visit: Payer: Self-pay

## 2023-01-24 DIAGNOSIS — Z6841 Body Mass Index (BMI) 40.0 and over, adult: Secondary | ICD-10-CM

## 2023-01-24 DIAGNOSIS — J410 Simple chronic bronchitis: Secondary | ICD-10-CM | POA: Diagnosis not present

## 2023-01-24 MED ORDER — SEMAGLUTIDE-WEIGHT MANAGEMENT 1 MG/0.5ML ~~LOC~~ SOAJ: 1.0000 mg | SUBCUTANEOUS | 0 refills | Status: DC

## 2023-01-24 MED ORDER — SEMAGLUTIDE-WEIGHT MANAGEMENT 2.4 MG/0.75ML ~~LOC~~ SOAJ: 2.4000 mg | SUBCUTANEOUS | 11 refills | Status: DC

## 2023-01-24 MED ORDER — DOXYCYCLINE HYCLATE 100 MG PO TABS
100.0000 mg | ORAL_TABLET | Freq: Two times a day (BID) | ORAL | 0 refills | Status: DC
Start: 2023-01-24 — End: 2023-02-23

## 2023-01-24 MED ORDER — SEMAGLUTIDE-WEIGHT MANAGEMENT 1.7 MG/0.75ML ~~LOC~~ SOAJ: 1.7000 mg | SUBCUTANEOUS | 0 refills | Status: DC

## 2023-01-24 MED ORDER — SEMAGLUTIDE-WEIGHT MANAGEMENT 0.5 MG/0.5ML ~~LOC~~ SOAJ: 0.5000 mg | SUBCUTANEOUS | 0 refills | Status: AC

## 2023-01-24 MED ORDER — PROMETHAZINE-DM 6.25-15 MG/5ML PO SYRP: 5.0000 mL | ORAL_SOLUTION | Freq: Four times a day (QID) | ORAL | 0 refills | Status: DC | PRN

## 2023-01-24 MED ORDER — SEMAGLUTIDE-WEIGHT MANAGEMENT 0.25 MG/0.5ML ~~LOC~~ SOAJ: 0.2500 mg | SUBCUTANEOUS | 0 refills | Status: AC

## 2023-01-24 NOTE — Telephone Encounter (Signed)
ENCOUNTER DOCUMENTATION: MyChart secure digital messaging clinical encounter Chief Complaint: Worsening cough and request for cough medication; insurance prior authorization inquiry for Ozempic Relevant History:  Recent visit 01/16/23 for bilateral acute suppurative otitis media and chronic pansinusitis Currently on antibiotics, Sudafed, and allergy medications History of OSA on CPAP Morbid obesity, recently restarted on Ozempic  Assessment:  Worsening cough, concerning for possible pertussis or atypical organisms given duration and severity Sleep disturbance secondary to cough Prior authorization needed for Ozempic  Plan:  Discontinue Augmentin and start Doxycycline 100mg  PO BID for broader coverage including pertussis and atypical organisms Prescribed Promethazine-DM syrup for symptomatic relief Will process [employer] prior authorization for Ozempic Continue current decongestant regimen Patient education provided regarding sleep hygiene and cough management Emergency precautions reviewed  References: CDC guidelines for pertussis treatment recommend doxycycline or azithromycin as appropriate alternatives for suspected cases (http://www.benton-garcia.com/) Patient Education: Educated about medication changes, potential side effects of doxycycline (including sun sensitivity and need to take with food), and provided cough management strategies. Discussed importance of completing full course of antibiotics. Follow-up:  Continue treatment plan Virtual follow-up in 3-4 days if symptoms persist or worsen Maintain scheduled follow-up for obesity management  Please see the MyChart message reply(ies) for my assessment and plan. This patient gave consent for this Medical Advice Message and is aware that it may result in a bill to [employer], as well as the possibility of receiving a bill for a co-payment or deductible. They are an established patient, but  are not seeking medical advice exclusively about a problem treated during an in-person or video visit in the last seven days. I did not recommend an in-person or video visit within seven days of my reply. I spent a total of 15 minutes cumulative time within 7 days through [employer]. Lula Olszewski, MD

## 2023-01-25 NOTE — Telephone Encounter (Signed)
Spoke with patient previously and she wants to try [facility] instead so I sent that in.

## 2023-01-31 ENCOUNTER — Telehealth: Payer: Self-pay

## 2023-01-31 NOTE — Telephone Encounter (Signed)
Error

## 2023-02-02 NOTE — Telephone Encounter (Signed)
Spoke with patient previously and she wants Wegovy instead of Ozempic. That's what I sent in to the pharmacy prior to now.

## 2023-02-03 ENCOUNTER — Telehealth: Payer: Self-pay

## 2023-02-03 ENCOUNTER — Other Ambulatory Visit (HOSPITAL_COMMUNITY): Payer: Self-pay

## 2023-02-03 NOTE — Telephone Encounter (Signed)
Pharmacy Patient Advocate Encounter  Received notification from [facility] that Prior Authorization for [facility] 0.25MG /0.5ML auto-injectors has been APPROVED from 02/03/23 to 09/01/23. Ran test claim, Copay is $24.99. This test claim was processed through [facility]- copay amounts may vary at other pharmacies due to pharmacy/plan contracts, or as the patient moves through the different stages of their insurance plan.   PA #/Case ID/Reference #: [id number]

## 2023-02-03 NOTE — Telephone Encounter (Signed)
Sent my chart message informing patient of this approval.

## 2023-02-03 NOTE — Telephone Encounter (Signed)
Pharmacy Patient Advocate Encounter   Received notification from Physician's Office that prior authorization for [facility] 0.25MG /0.5ML is required/requested.   Insurance verification completed.   The patient is insured through [facility] .   Per test claim: PA required; PA submitted to above mentioned insurance via CoverMyMeds Key/confirmation #/EOC  [id number] Status is pending

## 2023-02-21 ENCOUNTER — Encounter: Payer: Self-pay | Admitting: Internal Medicine

## 2023-02-21 DIAGNOSIS — R053 Chronic cough: Secondary | ICD-10-CM

## 2023-02-21 DIAGNOSIS — R631 Polydipsia: Secondary | ICD-10-CM

## 2023-02-21 DIAGNOSIS — E119 Type 2 diabetes mellitus without complications: Secondary | ICD-10-CM

## 2023-02-21 DIAGNOSIS — E282 Polycystic ovarian syndrome: Secondary | ICD-10-CM

## 2023-02-21 NOTE — Telephone Encounter (Signed)
ENCOUNTER DOCUMENTATION: MyChart secure digital messaging clinical encounter Chief Complaint: Persistent cough for >1 month and new onset polydipsia Relevant History:  Previous treatment 1 month ago for cough with doxycycline and Promethazine-DM Recent bilateral otitis media and sinusitis, now resolved History of OSA on CPAP Morbid obesity, on Ozempic Reports resolution of ear/throat symptoms but persistent cough New onset polydipsia  Assessment:  Chronic cough (>1 month duration) despite appropriate antibiotic therapy New onset polydipsia - concerning for:  Possible diabetes mellitus Medication side effect Other metabolic disorder   Previous otitis media/sinusitis - resolved  Plan:  Recommended in-person evaluation within 1-2 days due to:  Duration of symptoms Failed outpatient therapy New onset polydipsia requiring evaluation   Will check comprehensive metabolic panel, HbA1c at visit Will perform physical exam to evaluate for post-infectious cough vs other etiology Patient education provided regarding warning signs Continue current supportive care until appointment  Medical Decision Making: Moderate complexity due to:  Prolonged symptoms despite appropriate therapy New concerning symptoms (polydipsia) Multiple possible etiologies requiring evaluation Risk factors including obesity and recent Ozempic initiation  Please see the MyChart message reply(ies) for my assessment and plan. This patient gave consent for this Medical Advice Message and is aware that it may result in a bill to [employer], as well as the possibility of receiving a bill for a co-payment or deductible. They are an established patient, but are not seeking medical advice exclusively about a problem treated during an in-person or video visit in the last seven days. I did recommend an in-person visit within seven days of my reply. I spent a total of 15 minutes cumulative time within 7 days  through [employer]. Lula Olszewski, MD Problem List Updates: Add:  Chronic cough Polydipsia Follow-up needed: Evaluate for new onset diabetes Assess medication side effects Evaluate for chronic cough etiology

## 2023-02-23 ENCOUNTER — Encounter: Payer: Self-pay | Admitting: Internal Medicine

## 2023-02-23 ENCOUNTER — Ambulatory Visit (INDEPENDENT_AMBULATORY_CARE_PROVIDER_SITE_OTHER): Payer: 59 | Admitting: Internal Medicine

## 2023-02-23 VITALS — BP 134/78 | HR 75 | Temp 97.6°F | Ht 64.0 in | Wt 241.4 lb

## 2023-02-23 DIAGNOSIS — E119 Type 2 diabetes mellitus without complications: Secondary | ICD-10-CM | POA: Diagnosis not present

## 2023-02-23 DIAGNOSIS — Z7984 Long term (current) use of oral hypoglycemic drugs: Secondary | ICD-10-CM | POA: Diagnosis not present

## 2023-02-23 DIAGNOSIS — J45902 Unspecified asthma with status asthmaticus: Secondary | ICD-10-CM | POA: Diagnosis not present

## 2023-02-23 DIAGNOSIS — J45909 Unspecified asthma, uncomplicated: Secondary | ICD-10-CM | POA: Insufficient documentation

## 2023-02-23 DIAGNOSIS — G510 Bell's palsy: Secondary | ICD-10-CM

## 2023-02-23 DIAGNOSIS — H532 Diplopia: Secondary | ICD-10-CM | POA: Insufficient documentation

## 2023-02-23 DIAGNOSIS — H538 Other visual disturbances: Secondary | ICD-10-CM

## 2023-02-23 HISTORY — DX: Type 2 diabetes mellitus without complications: E11.9

## 2023-02-23 LAB — POCT GLYCOSYLATED HEMOGLOBIN (HGB A1C): Hemoglobin A1C: 11.1 % — AB (ref 4.0–5.6)

## 2023-02-23 MED ORDER — AIRSUPRA 90-80 MCG/ACT IN AERO: 2.0000 | INHALATION_SPRAY | RESPIRATORY_TRACT | 11 refills | Status: DC | PRN

## 2023-02-23 MED ORDER — BLOOD GLUCOSE MONITORING SUPPL DEVI: 1.0000 | Freq: Three times a day (TID) | 0 refills | Status: DC

## 2023-02-23 MED ORDER — PREDNISONE 20 MG PO TABS: ORAL_TABLET | ORAL | 0 refills | Status: DC

## 2023-02-23 MED ORDER — DEXCOM G7 SENSOR MISC: 1.0000 | Freq: Every day | 1 refills | Status: DC

## 2023-02-23 MED ORDER — DEXTROMETHORPHAN-GUAIFENESIN 10-100 MG/5ML PO LIQD: 10.0000 mL | ORAL | 1 refills | Status: DC | PRN

## 2023-02-23 MED ORDER — BENZONATATE 100 MG PO CAPS
100.0000 mg | ORAL_CAPSULE | Freq: Three times a day (TID) | ORAL | 0 refills | Status: DC
Start: 2023-02-23 — End: 2023-03-24

## 2023-02-23 NOTE — Assessment & Plan Note (Signed)
Type 2 Diabetes Mellitus (E11.9) - New diagnosis  A1c significantly elevated at 11.1% Presenting with classic symptoms of polydipsia Will initiate comprehensive diabetes management:  Starting Metformin 500mg  daily, increase to BID after 1 week Transitioning from [facility] to [facility] for dual benefit of weight management and glycemic control Ordered glucometer and supplies and cgm Diabetes education referral to be placed Follow-up in 2 weeks to check tolerance of medications Will need comprehensive eye exam within 1 month

## 2023-02-23 NOTE — Assessment & Plan Note (Signed)
Moderate Asthma with Status Asthmaticus (J45.42)  Currently experiencing exacerbation Treatment plan:  Starting Airsupra (albuterol-budesonide) inhaler PRN Prednisone taper: 40mg  daily x3 days, then 20mg  daily x4 days Added Tessalon Perles and Tussin DM for cough Continue Singulair and other maintenance medications

## 2023-02-23 NOTE — Patient Instructions (Signed)
It was a pleasure seeing you today! Your health and satisfaction are our top priorities.  Glenetta Hew, MD  Your Providers PCP: Lula Olszewski, MD,  [phone]) Referring Provider: Lula Olszewski, MD,  [phone]) Care Team Provider: Vesta Mixer, MD,  [phone]) Care Team Provider: Hermina Staggers, MD,  [phone]) Care Team Provider: Marcelino Duster, MD,  [phone])  AFTER VISIT SUMMARY AND PATIENT INSTRUCTIONS  Understanding Your New Diabetes Diagnosis: - Your A1c test result of 11.1% indicates you have diabetes - Normal A1c is below 5.7% - This test shows your average blood sugar over the past 3 months - With proper treatment, this can improve significantly  Important Next Steps: 1. Start checking your blood sugar:    - Check 3 times daily: before breakfast, before dinner, and at bedtime    - Keep a log of your readings    - Target range: 80-130 before meals, below 180 after meals    - Call if readings are consistently above 300  2. New Medications:    - Metformin: Start with one pill daily with dinner    - After one week, increase to one pill twice daily with meals    - May cause stomach upset initially - this usually improves    - Mounjaro: Will replace your current Wegovy injection    - Continue all other current medications  3. Signs to Watch For - Call or Seek Care if You Experience:    - Severe thirst or frequent urination that worsens    - Confusion or severe fatigue    - Blood sugar readings below 70 or above 300    - Any new vision changes    - Persistent nausea, vomiting, or abdominal pain  4. Lifestyle Recommendations:    - Continue current weight management efforts    - Limit carbohydrates, especially sugary drinks and processed foods    - Stay well hydrated with water    - Regular physical activity as tolerated    - Monitor your feet daily for any cuts or sores  For Your Asthma Exacerbation: - Use new Airsupra inhaler every 4  hours as needed - Take prednisone as directed: 2 pills daily for 3 days, then 1 pill daily for 4 days - Use cough medications as needed - Avoid triggers (cold air, allergens, irritants) - Seek emergency care if breathing becomes severely difficult  Follow-up Appointments Needed: 1. Diabetes follow-up in 2 weeks 2. Eye exam within 1 month 3. Neurology (we will call you with appointment time)  Call the office if: - You have questions about your new medications - Blood sugar readings are consistently high - Breathing problems worsen - New or worsening neurological symptoms develop   NEXT STEPS: [x]  Early Intervention: Schedule sooner appointment, call our on-call services, or go to emergency room if there is any significant Increase in pain or discomfort New or worsening symptoms Sudden or severe changes in your health [x]  Flexible Follow-Up: We recommend a No follow-ups on file. for optimal routine care. This allows for progress monitoring and treatment adjustments. [x]  Preventive Care: Schedule your annual preventive care visit! It's typically covered by insurance and helps identify potential health issues early. [x]  Lab & X-ray Appointments: Incomplete tests scheduled today, or call to schedule. X-rays: [facility] (M-F, 8:30am-noon or 1pm-5pm). [x]  Medical Information Release: Sign a release form at front desk to obtain relevant medical information we don't have.  MAKING THE MOST OF OUR FOCUSED  20 MINUTE APPOINTMENTS: [x]   Clearly state your top concerns at the beginning of the visit to focus our discussion [x]   If you anticipate you will need more time, please inform the front desk during scheduling - we can book multiple appointments in the same week. [x]   If you have transportation problems- use our convenient video appointments or ask about transportation support. [x]   We can get down to business faster if you use MyChart to update information before the visit  and submit non-urgent questions before your visit. Thank you for taking the time to provide details through MyChart.  Let our nurse know and she can import this information into your encounter documents.  Arrival and Wait Times: [x]   Arriving on time ensures that everyone receives prompt attention. [x]   Early morning (8a) and afternoon (1p) appointments tend to have shortest wait times. [x]   Unfortunately, we cannot delay appointments for late arrivals or hold slots during phone calls.  Getting Answers and Following Up [x]   Simple Questions & Concerns: For quick questions or basic follow-up after your visit, reach [place] at [phone] or MyChart messaging. [x]   Complex Concerns: If your concern is more complex, scheduling an appointment might be best. Discuss this with the staff to find the most suitable option. [x]   Lab & Imaging Results: We'll contact you directly if results are abnormal or you don't use MyChart. Most normal results will be on MyChart within 2-3 business days, with a review message from Dr. Jon Billings. Haven't heard back in 2 weeks? Need results sooner? Contact us at [phone]. [x]   Referrals: Our referral coordinator will manage specialist referrals. The specialist's office should contact you within 2 weeks to schedule an appointment. Call us if you haven't heard from them after 2 weeks.  Staying Connected [x]   MyChart: Activate your MyChart for the fastest way to access results and message [place]. See the last page of this paperwork for instructions on how to activate.  Bring to Your Next Appointment [x]   Medications: Please bring all your medication bottles to your next appointment to ensure we have an accurate record of your prescriptions. [x]   Health Diaries: If you're monitoring any health conditions at home, keeping a diary of your readings can be very helpful for discussions at your next appointment.  Billing [x]   X-ray & Lab Orders: These are billed by separate  companies. Contact the invoicing company directly for questions or concerns. [x]   Visit Charges: Discuss any billing inquiries with our administrative services team.  Your Satisfaction Matters [x]   Share Your Experience: We strive for your satisfaction! If you have any complaints, or preferably compliments, please let Dr. Jon Billings know directly or contact our Practice Administrators, Edwena Felty or [employer], by asking at the front desk.   Reviewing Your Records [x]   Review this early draft of your clinical encounter notes below and the final encounter summary tomorrow on MyChart after its been completed.  All orders placed so far are visible here: New onset type 2 diabetes mellitus (HCC) -     Blood Glucose Monitoring Suppl; 1 each by Does not apply route in the morning, at noon, and at bedtime. May substitute to any manufacturer covered by patient's insurance.  Dispense: 1 each; Refill: 0 -     POCT glycosylated hemoglobin (Hb A1C)  Moderate asthma with status asthmaticus, unspecified whether persistent -     Airsupra; Inhale 2 Inhalations into the lungs every 4 (four) hours as needed. Replaces albuterol, rescue inhaler  Dispense: 10.7 g; Refill: 11 -     Dextromethorphan-guaiFENesin; Take 10 mLs by mouth every 4 (four) hours as needed for cough.  Dispense: 180 mL; Refill: 1 -     Benzonatate; Take 1 capsule (100 mg total) by mouth 3 (three) times daily.  Dispense: 20 capsule; Refill: 0 -     predniSONE; Take 2 pills for 3 days, 1 pill for 4 days  Dispense: 10 tablet; Refill: 0 -     POCT glycosylated hemoglobin (Hb A1C)  Double vision -     Blood Glucose Monitoring Suppl; 1 each by Does not apply route in the morning, at noon, and at bedtime. May substitute to any manufacturer covered by patient's insurance.  Dispense: 1 each; Refill: 0 -     POCT glycosylated hemoglobin (Hb A1C) -     Ambulatory referral to Neurology  Blurry vision -     Blood Glucose Monitoring Suppl; 1 each by  Does not apply route in the morning, at noon, and at bedtime. May substitute to any manufacturer covered by patient's insurance.  Dispense: 1 each; Refill: 0 -     POCT glycosylated hemoglobin (Hb A1C)  Facial palsy  Diplopia

## 2023-02-23 NOTE — Assessment & Plan Note (Signed)
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

## 2023-02-23 NOTE — Progress Notes (Signed)
[facility]: [phone]   -- Medical Office Visit --  Patient:  Debbie [name]      Age: 52 y.o.       Sex:  female  Date:   02/23/2023 Today's Healthcare Provider: Lula Olszewski, MD  ==========================================================================     Assessment & Plan New onset type 2 diabetes mellitus (HCC) Type 2 Diabetes Mellitus (E11.9) - New diagnosis  A1c significantly elevated at 11.1% Presenting with classic symptoms of polydipsia Will initiate comprehensive diabetes management:  Starting Metformin 500mg  daily, increase to BID after 1 week Transitioning from [facility] to [facility] for dual benefit of weight management and glycemic control Ordered glucometer and supplies and cgm Diabetes education referral to be placed Follow-up in 2 weeks to check tolerance of medications Will need comprehensive eye exam within 1 month Moderate asthma with status asthmaticus, unspecified whether persistent Moderate Asthma with Status Asthmaticus (J45.42)  Currently experiencing exacerbation Treatment plan:  Starting Airsupra (albuterol-budesonide) inhaler PRN Prednisone taper: 40mg  daily x3 days, then 20mg  daily x4 days Added Tessalon Perles and Tussin DM for cough Continue Singulair and other maintenance medications Double vision  Blurry vision  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, 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)  New onset diplopia since November 2024 Right-sided facial weakness ongoing for 1 year Negative MRI in May 2024 Plan:  Urgent neurology referral placed Monitor for progression of symptoms Return immediately if any new neurological symptoms develop Diplopia 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 May substitute to any manufacturer covered by patient's insurance. -     POCT HgB A1C -     Continuous Glucose Sensor (DEXCOM G7 SENSOR) MISC; 1 Act by Does not apply route daily. -     Ambulatory referral to diabetic education Moderate asthma with status asthmaticus, unspecified whether persistent -     Albuterol-Budesonide (AIRSUPRA) 90-80 MCG/ACT AERO; Inhale 2 Inhalations into the lungs every 4 (four) hours as needed. Replaces albuterol, rescue inhaler -     dextromethorphan-guaiFENesin (TUSSIN DM) 10-100 MG/5ML liquid; Take 10 mLs by mouth every 4 (four) hours as needed for cough. -     benzonatate (TESSALON PERLES) 100 MG capsule; Take 1 capsule (100 mg total) by mouth 3 (three) times daily. -     predniSONE (DELTASONE) 20 MG tablet; Take 2 pills for 3 days, 1 pill for 4 days -     POCT HgB A1C Double vision -     Blood Glucose Monitoring Suppl DEVI; 1 each by Does not apply route in the morning, at noon, and at bedtime. May substitute to any manufacturer covered by patient's insurance. -     POCT HgB A1C -     Ambulatory referral to Neurology Blurry vision -     Blood Glucose Monitoring Suppl DEVI; 1 each by Does not apply route in the morning, at noon, and at bedtime. May substitute to any manufacturer covered by patient's insurance. -     POCT HgB A1C  Facial palsy Diplopia  Planned follow up 03/16/2023 Follow-up:  Diabetes check in 2 weeks Neurology evaluation pending scheduling Call if any worsening symptoms before scheduled follow-up  SUBJECTIVE: 52 y.o. female who has Hypothyroidism; Hyperlipidemia; Seasonal allergies; Morbid obesity (HCC); Hypertension; History of endometrial ablation; Upper abdominal  pain; Itching of ear; Hypertriglyceridemia; Other chronic sinusitis; Vertigo; Tympanosclerosis; Weakness on right side of face; Seasonal depression (HCC); Facial palsy; Diplopia; Blurry vision; New onset type 2 diabetes mellitus (HCC); and Moderate asthma with status asthmaticus on their problem list.  Main reasons for visit/main concerns/chief complaint: Cough (Dry and productive, sometimes mucus is clear and other times has a tinge of yellow.), Polydipsia (Drinks plenty of water.), Nasal Congestion (Runny nose.), and Vision issue (Seeing double vision with glasses.)    AI-Extracted: Discussed the use of AI scribe software for clinical note transcription with the patient, who gave verbal consent to proceed.  HISTORY OF PRESENT ILLNESS:  52 year old female with multiple chronic conditions presents with several active concerns. Patient reports persistent cough that is both dry and productive, with mucus varying between clear and yellow-tinged. Additionally experiencing polydipsia with increased water intake. Also notes ongoing nasal congestion with rhinorrhea.   Of particular concern, patient describes new onset diplopia when wearing glasses. This occurs in context of pre-existing right-sided facial weakness that has been present for approximately one year, with a negative MRI in May 2024. The diplopia began in November 2024.  Point-of-care A1c testing during today's visit revealed a significantly elevated value of 11.1%, diagnostic of new onset diabetes mellitus. This correlates with the patient's reported polydipsia.  Review of recent vital signs shows improving blood pressure trend from 147/84 to current 134/78. Weight has decreased from 251 lbs to 241 lbs over the past month while on Wegovy.  Patient's other active conditions include hypothyroidism, hyperlipidemia, morbid obesity, hypertension, asthma (currently experiencing exacerbation), and seasonal depression, all requiring ongoing  management.   Note that patient  has a past medical history of Acute renal failure (ARF) (HCC) (10/01/2015), Allergy, Depression, Elevated cholesterol, History of endometrial ablation (05/20/2021), Hyperlipidemia, Hypertension, Hypertriglyceridemia (02/08/2022), Hypothyroidism, Insomnia (11/08/2021), Morbid obesity (HCC) (02/08/2019), Seasonal allergies, Sleep apnea, Thyroid disease, and Tympanosclerosis (07/11/2022).  Problem list overviews that were updated at today's visit: Problem  Facial Palsy   Facial Palsy (G51.0) Status: Updated Key Events:  Onset approximately 02/2022 MRI negative 07/2022 New associated diplopia 01/2023 Current Treatment: Neurology referral placed Monitoring: Monitor for progression Await neurology evaluation Tags: #UpdatedProblem #RequiresFollowUp #ReferralPending   Diplopia  Blurry Vision  New Onset Type 2 Diabetes Mellitus (Hcc)   Type 2 Diabetes Mellitus (E11.9) Onset: 02/23/2023 Key Events:  Initial A1c: 11.1% Presenting with polydipsia Current Treatment: Initiating Metformin and Mounjaro Monitoring: Blood glucose monitoring TID Follow-up in 2 weeks Quality Metrics: Eye exam needed Diabetes education pending Tags: #NewProblem #RequiresFollowUp #PatientEducation   Moderate Asthma With Status Asthmaticus   Moderate Asthma with Status Asthmaticus (J45.42) Status: Updated Current Exacerbation:  Productive and dry cough Yellow-tinged sputum Treatment Updates: New Airsupra inhaler Prednisone taper Symptomatic treatment with antitussives Monitoring: Breathing status Response to treatment Tags: #UpdatedProblem #AcuteExacerbation #Monitoring     Med reconciliation: Current Outpatient Medications on File Prior to Visit  Medication Sig   albuterol (VENTOLIN HFA) 108 (90 Base) MCG/ACT inhaler 1 PUFF AS NEEDED EVERY 4 HOURS FOR COUGH/WHEEZE INHALATION 90 DAYS   Ascorbic Acid (VITAMIN C) 1000 MG tablet Take 1,000 mg by mouth daily.    Azelastine-Fluticasone 137-50 MCG/ACT SUSP PLACE 1 SPRAY INTO THE NOSE EVERY 12 (TWELVE)  HOURS.   BIOTIN PO Take 1 tablet by mouth daily.   cetirizine (ZYRTEC) 10 MG tablet Take 10 mg by mouth daily.   Cholecalciferol (D3-1000 PO) Take 1,000 Units by mouth daily.   Cyanocobalamin (VITAMIN B12 PO) Take by mouth daily.   DANDELION ROOT PO Take by mouth.   escitalopram (LEXAPRO) 20 MG tablet TAKE 1 TABLET BY MOUTH EVERY DAY   hydrochlorothiazide (HYDRODIURIL) 25 MG tablet    ibuprofen (ADVIL) 600 MG tablet Take 600 mg by mouth every 8 (eight) hours as needed for cramping, moderate pain, mild pain, headache or fever.   levocetirizine (XYZAL) 5 MG tablet Take 1 tablet (5 mg total) by mouth every evening.   levothyroxine (SYNTHROID) 100 MCG tablet TAKE 1 TABLET BY MOUTH EVERY DAY   losartan-hydrochlorothiazide (HYZAAR) 50-12.5 MG tablet TAKE 1 TABLET BY MOUTH DAILY. OK TO START WITH JUST HALF TABLET DAILY FOR WEEK 1 AND LEAVE IT THERE IF GOAL OF 140/90 REACHED.   meclizine (ANTIVERT) 25 MG tablet Take 1 tablet (25 mg total) by mouth 3 (three) times daily as needed for dizziness.   montelukast (SINGULAIR) 10 MG tablet Take 1 tablet by mouth every evening.   Multiple Vitamins-Minerals (HAIR/SKIN/NAILS) CAPS Take 1 capsule by mouth daily.   Multiple Vitamins-Minerals (MULTIVITAMIN WOMEN 50+) TABS Take 1 tablet by mouth 1 day or 1 dose.   NEOMYCIN-POLYMYXIN-HYDROCORTISONE (CORTISPORIN) 1 % SOLN OTIC solution Place 1-3 drops into both ears every 8 (eight) hours as needed.   promethazine-dextromethorphan (PROMETHAZINE-DM) 6.25-15 MG/5ML syrup Take 5 mLs by mouth 4 (four) times daily as needed for cough.   pseudoephedrine (SUDAFED 12 HOUR) 120 MG 12 hr tablet Take 1 tablet (120 mg total) by mouth 2 (two) times daily.   rosuvastatin (CRESTOR) 20 MG tablet TAKE 1 TABLET BY MOUTH EVERY DAY   Saline (SIMPLY SALINE) 0.9 % AERS Place 2 each into the nose as directed. Use nightly for sinus hygiene long-term.  Can  also be used as many times daily as desired to assist with clearing congested sinuses.   Semaglutide-Weight Management 0.5 MG/0.5ML SOAJ Inject 0.5 mg into the skin once a week for 28 days.   [START ON 03/23/2023] Semaglutide-Weight Management 1 MG/0.5ML SOAJ Inject 1 mg into the skin once a week for 28 days.   [START ON 04/21/2023] Semaglutide-Weight Management 1.7 MG/0.75ML SOAJ Inject 1.7 mg into the skin once a week for 28 days.   [START ON 05/20/2023] Semaglutide-Weight Management 2.4 MG/0.75ML SOAJ Inject 2.4 mg into the skin once a week.   topiramate (TOPAMAX) 25 MG tablet TAKE 1 TABLET (25 MG TOTAL) BY MOUTH 2 (TWO) TIMES DAILY. TAKE JUST ONE TABLET DAILY FOR THE FIRST WEEK.   WEGOVY 0.25 MG/0.5ML SOAJ Inject 0.25 mg into the skin once a week.   Zinc 50 MG TABS Take by mouth daily.   No current facility-administered medications on file prior to visit.   Medications Discontinued During This Encounter  Medication Reason   doxycycline (VIBRA-TABS) 100 MG tablet      Objective   Physical Exam     02/23/2023    3:16 PM 01/16/2023    1:13 PM 01/16/2023    1:03 PM  Vitals with BMI  Height 5\' 4"   5\' 4"   Weight 241 lbs 6 oz  251 lbs 6 oz  BMI 41.42  43.13  Systolic 134 140 782  Diastolic 78 90 84  Pulse 75  67   Wt Readings from Last 10 Encounters:  02/23/23 241 lb  6.4 oz (109.5 kg)  01/16/23 251 lb 6.4 oz (114 kg)  08/22/22 246 lb (111.6 kg)  07/27/22 241 lb 4.8 oz (109.5 kg)  07/11/22 240 lb (108.9 kg)  02/08/22 239 lb 6.4 oz (108.6 kg)  11/08/21 236 lb 6.4 oz (107.2 kg)  08/18/21 239 lb (108.4 kg)  06/11/21 234 lb (106.1 kg)  05/20/21 245 lb 12.8 oz (111.5 kg)   Vital signs reviewed.  Nursing notes reviewed. Weight trend reviewed. Abnormalities and Problem-Specific physical exam findings:  truncal adiposity  , mild wheezing in upper lobes and wheezy frequent cough, drinking lots of water, tired appearing General Appearance:  No acute distress appreciable.   Well-groomed,  healthy-appearing female.  Well proportioned with no abnormal fat distribution.  Good muscle tone. Pulmonary:  Normal work of breathing at rest, no respiratory distress apparent. SpO2: 95 %  Musculoskeletal: All extremities are intact.  Neurological:  Awake, alert, oriented, and engaged.  No obvious focal neurological deficits or cognitive impairments.  Sensorium seems unclouded.   Speech is clear and coherent with logical content. Psychiatric:  Appropriate mood, pleasant and cooperative demeanor, thoughtful and engaged during the exam  Results            Results for orders placed or performed in visit on 02/23/23  POCT HgB A1C  Result Value Ref Range   Hemoglobin A1C 11.1 (A) 4.0 - 5.6 %    Office Visit on 02/23/2023  Component Date Value   Hemoglobin A1C 02/23/2023 11.1 (A)   Office Visit on 08/22/2022  Component Date Value   Sodium 08/22/2022 141    Potassium 08/22/2022 3.9    Chloride 08/22/2022 105    CO2 08/22/2022 23    Glucose, Bld 08/22/2022 105 (H)    BUN 08/22/2022 13    Creatinine, Ser 08/22/2022 0.64    Total Bilirubin 08/22/2022 0.4    Alkaline Phosphatase 08/22/2022 54    AST 08/22/2022 29    ALT 08/22/2022 27    Total Protein 08/22/2022 7.2    Albumin 08/22/2022 4.4    GFR 08/22/2022 101.95    Calcium 08/22/2022 9.6    TSH 08/22/2022 1.35    Hgb A1c MFr Bld 08/22/2022 6.2    WBC 08/22/2022 7.5    RBC 08/22/2022 4.20    Hemoglobin 08/22/2022 13.3    HCT 08/22/2022 38.6    MCV 08/22/2022 91.9    MCHC 08/22/2022 34.5    RDW 08/22/2022 13.6    Platelets 08/22/2022 271.0    Neutrophils Relative % 08/22/2022 54.4    Lymphocytes Relative 08/22/2022 35.4    Monocytes Relative 08/22/2022 7.0    Eosinophils Relative 08/22/2022 2.5    Basophils Relative 08/22/2022 0.7    Neutro Abs 08/22/2022 4.1    Lymphs Abs 08/22/2022 2.7    Monocytes Absolute 08/22/2022 0.5    Eosinophils Absolute 08/22/2022 0.2    Basophils Absolute 08/22/2022 0.1    VITD 08/22/2022  49.43    Cholesterol 08/22/2022 174    Triglycerides 08/22/2022 218.0 (H)    HDL 08/22/2022 45.50    VLDL 08/22/2022 43.6 (H)    Total CHOL/HDL Ratio 08/22/2022 4    NonHDL 08/22/2022 128.28    Direct LDL 08/22/2022 100.0    No image results found.   No results found.  MM 3D SCREENING MAMMOGRAM BILATERAL BREAST  Result Date: 09/06/2022 CLINICAL DATA:  Screening. EXAM: DIGITAL SCREENING BILATERAL MAMMOGRAM WITH TOMOSYNTHESIS AND CAD TECHNIQUE: Bilateral screening digital craniocaudal and mediolateral oblique mammograms were obtained. Bilateral screening digital  breast tomosynthesis was performed. The images were evaluated with computer-aided detection. COMPARISON:  Previous exam(s). ACR Breast Density Category b: There are scattered areas of fibroglandular density. FINDINGS: There are no findings suspicious for malignancy. IMPRESSION: No mammographic evidence of malignancy. A result letter of this screening mammogram will be mailed directly to the patient. RECOMMENDATION: Screening mammogram in one year. (Code:SM-B-01Y) BI-RADS CATEGORY  1: Negative. Electronically Signed   By: Emmaline Kluver M.D.   On: 09/06/2022 08:02         Additional Info: This encounter employed real-time, collaborative documentation. The patient actively reviewed and updated their medical record on a shared screen, ensuring transparency and facilitating joint problem-solving for the problem list, overview, and plan. This approach promotes accurate, informed care. The treatment plan was discussed and reviewed in detail, including medication safety, potential side effects, and all patient questions. We confirmed understanding and comfort with the plan. Follow-up instructions were established, including contacting the office for any concerns, returning if symptoms worsen, persist, or new symptoms develop, and precautions for potential emergency department visits.  TIME ATTESTATION:  I personally spent 55 minutes  providing care for this patient on the date of encounter. This extended visit duration was medically necessary due to multiple complex factors:  Medical Decision Making Complexity: - New diagnosis of diabetes with critically elevated A1c of 11.1% requiring immediate intervention - Active asthma exacerbation requiring multiple medication adjustments - Progressive neurological symptoms (diplopia with facial palsy) requiring urgent evaluation - Complex medication changes including transition from [facility] to [facility]  Time-Intensive Activities Performed: - Comprehensive review of extensive medication list and recent health changes - Detailed neurological evaluation given concerning symptoms - Multiple new medication prescriptions with insurance coordination - Extensive counseling regarding new diabetes diagnosis and management - Creation of comprehensive diabetes care plan - Coordination of multiple referrals (neurology, ophthalmology) - Blood glucose monitoring instruction - Asthma action plan updates - Patient education regarding multiple new medications - Review of warning signs requiring urgent attention for multiple conditions  Care Coordination: - Multiple referrals requiring coordination - Prior authorization initiation for [facility] - Pharmacy coordination for multiple new prescriptions - Communication with insurance for diabetic supplies  This level of service was necessary to ensure proper management of multiple acute and chronic conditions, particularly given the new diagnosis of poorly controlled diabetes requiring immediate intervention and education.

## 2023-02-24 ENCOUNTER — Telehealth: Payer: Self-pay | Admitting: Internal Medicine

## 2023-02-24 ENCOUNTER — Encounter: Payer: Self-pay | Admitting: Neurology

## 2023-02-24 NOTE — Telephone Encounter (Signed)
Patient called stating that PCP didn't send in the metformin and the mounjaro for her newly diagnosed diabetes. Pt requests this be sent in ASAP.

## 2023-02-24 NOTE — Telephone Encounter (Signed)
Patient called back for an update. Pt expressed concern at the possibility of this not being done and her having to go the whole weekend with these medications.

## 2023-02-26 ENCOUNTER — Encounter: Payer: Self-pay | Admitting: Internal Medicine

## 2023-02-26 MED ORDER — LANCET DEVICE MISC: 1.0000 | Freq: Three times a day (TID) | 0 refills | Status: DC

## 2023-02-26 MED ORDER — TIRZEPATIDE 2.5 MG/0.5ML ~~LOC~~ SOAJ: 2.5000 mg | SUBCUTANEOUS | 2 refills | Status: DC

## 2023-02-26 MED ORDER — BLOOD GLUCOSE TEST VI STRP: 1.0000 | ORAL_STRIP | Freq: Three times a day (TID) | Status: DC

## 2023-02-26 MED ORDER — METFORMIN HCL 500 MG PO TABS: 500.0000 mg | ORAL_TABLET | Freq: Two times a day (BID) | ORAL | 3 refills | Status: DC

## 2023-02-26 MED ORDER — BLOOD GLUCOSE MONITORING SUPPL DEVI: 1.0000 | Freq: Three times a day (TID) | 0 refills | Status: DC

## 2023-02-26 MED ORDER — LANCETS MISC. MISC: 1.0000 | Freq: Three times a day (TID) | 0 refills | Status: DC

## 2023-02-26 NOTE — Addendum Note (Signed)
Addended by: Lula Olszewski on: 02/26/2023 02:58 PM   Modules accepted: Orders

## 2023-02-26 NOTE — Addendum Note (Signed)
Addended by: Lula Olszewski on: 02/26/2023 01:23 PM   Modules accepted: Orders

## 2023-02-27 ENCOUNTER — Other Ambulatory Visit: Payer: Self-pay

## 2023-02-27 DIAGNOSIS — E282 Polycystic ovarian syndrome: Secondary | ICD-10-CM | POA: Insufficient documentation

## 2023-02-27 DIAGNOSIS — E119 Type 2 diabetes mellitus without complications: Secondary | ICD-10-CM

## 2023-02-27 MED ORDER — ACCU-CHEK GUIDE TEST VI STRP: ORAL_STRIP | 12 refills | Status: DC

## 2023-02-27 NOTE — Telephone Encounter (Signed)
I have sent in the test strips to the pharmacy on file.

## 2023-02-27 NOTE — Telephone Encounter (Signed)
Called and spoke to patient. She started metformin taking Debbie [name] is in prior authorization  No plans for insulin Nutrition put off until January due to soonest they can get in Ophthalmology told her to wait until sugar controlled Home sugars in 400s still Yet to pick up strips, these were not in tester kit. Has been stressed by all this.

## 2023-02-27 NOTE — Progress Notes (Deleted)
Initial neurology clinic note  Reason for Evaluation: Consultation requested by Lula Olszewski, MD for an opinion regarding diplopia. My final recommendations will be communicated back to the requesting physician by way of shared medical record or letter to requesting physician via [place] mail.  HPI: This is [name], a 52 y.o. ***-handed female with a medical history of DM, HTN, HLD, hypothyroidism, OSA (on CPAP), depression, and asthma*** who presents to neurology clinic with the chief complaint of ***. The patient is accompanied by ***.  *** Right sided facial weakness for about 1 year Had a normal MRI brain 08/17/22 Now with diplopia since 01/2023  The patient has not*** had similar episodes of symptoms in the past. ***  Muscle bulk loss? *** Muscle pain? ***  Cramps/Twitching? *** Suggestion of myotonia/difficulty relaxing after contraction? ***  Fatigable weakness?*** Does strength improve after brief exercise?***  Able to brush hair/teeth without difficulty? *** Able to button shirts/use zips? *** Clumsiness/dropping grasped objects?*** Can you arise from squatted position easily? *** Able to get out of chair without using arms? *** Able to walk up steps easily? *** Use an assistive device to walk? *** Significant imbalance with walking? *** Falls?*** Any change in urine color, especially after exertion/physical activity? ***  The patient denies*** symptoms suggestive of oculobulbar weakness including diplopia, ptosis, dysphagia, poor saliva control, dysarthria/dysphonia, impaired mastication, facial weakness/droop.  There are no*** neuromuscular respiratory weakness symptoms, particularly orthopnea>dyspnea.   Pseudobulbar affect is absent***.  The patient does not*** report symptoms referable to autonomic dysfunction including impaired sweating, heat or cold intolerance, excessive mucosal dryness, gastroparetic early satiety, postprandial abdominal bloating,  constipation, bowel or bladder dyscontrol, erectile dysfunction*** or syncope/presyncope/orthostatic intolerance.  There are no*** complaints relating to other symptoms of small fiber modalities including paresthesia/pain.  The patient has not *** noticed any recent skin rashes nor does he*** report any constitutional symptoms like fever, night sweats, anorexia or unintentional weight loss.  EtOH use: ***  Restrictive diet? *** Family history of neuropathy/myopathy/NM disease?***  Previous labs, electrodiagnostics, and neuroimaging are summarized below, but pertinent findings include***  Any biopsy done? *** Current medications being tried for the patient's symptoms include ***  Prior medications that have been tried: ***   MEDICATIONS:  Outpatient Encounter Medications as of 03/08/2023  Medication Sig   albuterol (VENTOLIN HFA) 108 (90 Base) MCG/ACT inhaler 1 PUFF AS NEEDED EVERY 4 HOURS FOR COUGH/WHEEZE INHALATION 90 DAYS   Albuterol-Budesonide (AIRSUPRA) 90-80 MCG/ACT AERO Inhale 2 Inhalations into the lungs every 4 (four) hours as needed. Replaces albuterol, rescue inhaler   Ascorbic Acid (VITAMIN C) 1000 MG tablet Take 1,000 mg by mouth daily.   Azelastine-Fluticasone 137-50 MCG/ACT SUSP PLACE 1 SPRAY INTO THE NOSE EVERY 12 (TWELVE) HOURS.   benzonatate (TESSALON PERLES) 100 MG capsule Take 1 capsule (100 mg total) by mouth 3 (three) times daily.   BIOTIN PO Take 1 tablet by mouth daily.   Blood Glucose Monitoring Suppl DEVI 1 each by Does not apply route in the morning, at noon, and at bedtime. May substitute to any manufacturer covered by patient's insurance.   Blood Glucose Monitoring Suppl DEVI 1 each by Does not apply route in the morning, at noon, and at bedtime. May substitute to any manufacturer covered by patient's insurance.   cetirizine (ZYRTEC) 10 MG tablet Take 10 mg by mouth daily.   Cholecalciferol (D3-1000 PO) Take 1,000 Units by mouth daily.   Continuous Glucose  Sensor (DEXCOM G7 SENSOR) MISC 1  Act by Does not apply route daily.   Cyanocobalamin (VITAMIN B12 PO) Take by mouth daily.   DANDELION ROOT PO Take by mouth.   dextromethorphan-guaiFENesin (TUSSIN DM) 10-100 MG/5ML liquid Take 10 mLs by mouth every 4 (four) hours as needed for cough.   escitalopram (LEXAPRO) 20 MG tablet TAKE 1 TABLET BY MOUTH EVERY DAY   Glucose Blood (BLOOD GLUCOSE TEST STRIPS) STRP 1 each by In Vitro route in the morning, at noon, and at bedtime. May substitute to any manufacturer covered by patient's insurance.   hydrochlorothiazide (HYDRODIURIL) 25 MG tablet    ibuprofen (ADVIL) 600 MG tablet Take 600 mg by mouth every 8 (eight) hours as needed for cramping, moderate pain, mild pain, headache or fever.   Lancet Device MISC 1 each by Does not apply route in the morning, at noon, and at bedtime. May substitute to any manufacturer covered by patient's insurance.   Lancets Misc. MISC 1 each by Does not apply route in the morning, at noon, and at bedtime. May substitute to any manufacturer covered by patient's insurance.   levocetirizine (XYZAL) 5 MG tablet Take 1 tablet (5 mg total) by mouth every evening.   levothyroxine (SYNTHROID) 100 MCG tablet TAKE 1 TABLET BY MOUTH EVERY DAY   losartan-hydrochlorothiazide (HYZAAR) 50-12.5 MG tablet TAKE 1 TABLET BY MOUTH DAILY. OK TO START WITH JUST HALF TABLET DAILY FOR WEEK 1 AND LEAVE IT THERE IF GOAL OF 140/90 REACHED.   meclizine (ANTIVERT) 25 MG tablet Take 1 tablet (25 mg total) by mouth 3 (three) times daily as needed for dizziness.   metFORMIN (GLUCOPHAGE) 500 MG tablet Take 1 tablet (500 mg total) by mouth 2 (two) times daily with a meal.   montelukast (SINGULAIR) 10 MG tablet Take 1 tablet by mouth every evening.   Multiple Vitamins-Minerals (HAIR/SKIN/NAILS) CAPS Take 1 capsule by mouth daily.   Multiple Vitamins-Minerals (MULTIVITAMIN WOMEN 50+) TABS Take 1 tablet by mouth 1 day or 1 dose.   NEOMYCIN-POLYMYXIN-HYDROCORTISONE  (CORTISPORIN) 1 % SOLN OTIC solution Place 1-3 drops into both ears every 8 (eight) hours as needed.   predniSONE (DELTASONE) 20 MG tablet Take 2 pills for 3 days, 1 pill for 4 days   promethazine-dextromethorphan (PROMETHAZINE-DM) 6.25-15 MG/5ML syrup Take 5 mLs by mouth 4 (four) times daily as needed for cough.   pseudoephedrine (SUDAFED 12 HOUR) 120 MG 12 hr tablet Take 1 tablet (120 mg total) by mouth 2 (two) times daily.   rosuvastatin (CRESTOR) 20 MG tablet TAKE 1 TABLET BY MOUTH EVERY DAY   Saline (SIMPLY SALINE) 0.9 % AERS Place 2 each into the nose as directed. Use nightly for sinus hygiene long-term.  Can also be used as many times daily as desired to assist with clearing congested sinuses.   Semaglutide-Weight Management 0.5 MG/0.5ML SOAJ Inject 0.5 mg into the skin once a week for 28 days.   [START ON 03/23/2023] Semaglutide-Weight Management 1 MG/0.5ML SOAJ Inject 1 mg into the skin once a week for 28 days.   [START ON 04/21/2023] Semaglutide-Weight Management 1.7 MG/0.75ML SOAJ Inject 1.7 mg into the skin once a week for 28 days.   [START ON 05/20/2023] Semaglutide-Weight Management 2.4 MG/0.75ML SOAJ Inject 2.4 mg into the skin once a week.   tirzepatide [facility]) 2.5 MG/0.5ML Pen Inject 2.5 mg into the skin once a week.   topiramate (TOPAMAX) 25 MG tablet TAKE 1 TABLET (25 MG TOTAL) BY MOUTH 2 (TWO) TIMES DAILY. TAKE JUST ONE TABLET DAILY FOR THE FIRST WEEK.  WEGOVY 0.25 MG/0.5ML SOAJ Inject 0.25 mg into the skin once a week.   Zinc 50 MG TABS Take by mouth daily.   No facility-administered encounter medications on file as of 03/08/2023.    PAST MEDICAL HISTORY: Past Medical History:  Diagnosis Date   Acute renal failure (ARF) (HCC) 10/01/2015   Allergy    Depression    Elevated cholesterol    History of endometrial ablation 05/20/2021   2021   Hyperlipidemia    Hypertension    Hypertriglyceridemia 02/08/2022   No components found for: "TG" Lab  Results Component Value Date/Time  TRIG 125.0 08/18/2021 08:53 AM  TRIG 163.0 (H) 12/29/2020 09:20 AM  TRIG 148.0 08/04/2020 08:24 AM  TRIG 177.0 (H) 04/03/2019 07:30 AM     Hypothyroidism    Insomnia 11/08/2021   Morbid obesity (HCC) 02/08/2019   Seasonal allergies    Sleep apnea    wears cpap    Thyroid disease    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 Irena Cords     PAST SURGICAL HISTORY: Past Surgical History:  Procedure Laterality Date   ABLATION  10/2019   ADENOIDECTOMY     COLONOSCOPY     DENTAL SURGERY     graft of mouth     ALLERGIES: Allergies  Allergen Reactions   Atorvastatin Hives   Other Shortness Of Breath    FAMILY HISTORY: Family History  Problem Relation Age of Onset   Heart disease Father    Hypertension Mother    Hyperlipidemia Mother    Breast cancer Maternal Grandmother    Prostate cancer Maternal Grandfather    Diabetes Maternal Grandfather    CAD Maternal Grandfather    Colon cancer Neg Hx    Colon polyps Neg Hx    Esophageal cancer Neg Hx    Stomach cancer Neg Hx    Rectal cancer Neg Hx     SOCIAL HISTORY: Social History   Tobacco Use   Smoking status: Never   Smokeless tobacco: Never  Vaping Use   Vaping status: Never Used  Substance Use Topics   Alcohol use: No   Drug use: No   Social History   Social History Narrative   Not on file     OBJECTIVE: PHYSICAL EXAM: There were no vitals taken for this visit.  General:*** General appearance: Awake and alert. No distress. Cooperative with exam.  Skin: No obvious rash or jaundice. HEENT: Atraumatic. Anicteric. Lungs: Non-labored breathing on room air  Heart: Regular Abdomen: Soft, non tender. Extremities: No edema. No obvious deformity.  Musculoskeletal: No obvious joint swelling. Psych: Affect appropriate.  Neurological: Mental Status: Alert. Speech fluent. No pseudobulbar affect Cranial Nerves: CNII: No  RAPD. Visual fields grossly intact. CNIII, IV, VI: PERRL. No nystagmus. EOMI. CN V: Facial sensation intact bilaterally to fine touch. [place] clench strong. Jaw jerk***. CN VII: Facial muscles symmetric and strong. No ptosis at rest or after sustained upgaze***. CN VIII: Hearing grossly intact bilaterally. CN IX: No hypophonia. CN X: Palate elevates symmetrically. CN XI: Full strength shoulder shrug bilaterally. CN XII: Tongue protrusion full and midline. No atrophy or fasciculations. No significant dysarthria*** Motor: Tone is ***. *** fasciculations in *** extremities. *** atrophy. No grip or percussive myotonia.***  Individual muscle group testing (MRC grade out of 5):  Movement     Neck flexion ***    Neck extension ***     Right Left   Shoulder abduction *** ***  Shoulder adduction *** ***   Shoulder ext rotation *** ***   Shoulder int rotation *** ***   Elbow flexion *** ***   Elbow extension *** ***   Wrist extension *** ***   Wrist flexion *** ***   Finger abduction - FDI *** ***   Finger abduction - ADM *** ***   Finger extension *** ***   Finger distal flexion - 2/3 *** ***   Finger distal flexion - 4/5 *** ***   Thumb flexion - FPL *** ***   Thumb abduction - APB *** ***    Hip flexion *** ***   Hip extension *** ***   Hip adduction *** ***   Hip abduction *** ***   Knee extension *** ***   Knee flexion *** ***   Dorsiflexion *** ***   Plantarflexion *** ***   Inversion *** ***   Eversion *** ***   Great toe extension *** ***   Great toe flexion *** ***     Reflexes:  Right Left   Bicep *** ***   Tricep *** ***   BrRad *** ***   Knee *** ***   Ankle *** ***    Pathological Reflexes: Babinski: *** response bilaterally*** Hoffman: *** Troemner: *** Pectoral: *** Palmomental: *** Facial: *** Midline tap: *** Sensation: Pinprick: *** Vibration: *** Temperature: *** Proprioception: *** Coordination: Intact finger-to- nose-finger  bilaterally. Romberg negative.*** Gait: Able to rise from chair with arms crossed unassisted. Normal, narrow-based gait. Able to tandem walk. Able to walk on toes and heels.***  Lab and Test Review: Internal labs: HbA1c (02/23/23): 11.1  08/22/22: Lipid panel: tChol 174, LDL 128.28, TG 218 Vit D wnl TSH wnl CBC w/ diff unremarkable (MCV 91.9) CMP unremarkable  B12 (08/04/20): 922  External labs: ***  Imaging: MRI brain w/wo contrast (08/17/22): FINDINGS: Brain: No acute infarction, hemorrhage, hydrocephalus, extra-axial collection or mass lesion.   Vascular: Normal flow voids.   Skull and upper cervical spine: Normal marrow signal.   Sinuses/Orbits: Moderate left sphenoid sinus mucosal thickening.   Other: No mastoid effusions   IMPRESSION: 1. Unremarkable appearance of the brain. No acute intracranial abnormality. 2. Moderate left sphenoid sinus mucosal thickening ***  ASSESSMENT: Debbie [name] is a 52 y.o. female who presents for evaluation of ***. *** has a relevant medical history of ***. *** neurological examination is pertinent for ***. Available diagnostic data is significant for ***. This constellation of symptoms and objective data would most likely localize to ***. ***  PLAN: -Blood work: *** ***  -Return to clinic ***  The impression above as well as the plan as outlined below were extensively discussed with the patient (in the company of ***) who voiced understanding. All questions were answered to their satisfaction.  The patient was counseled on pertinent fall precautions per the printed material provided today, and as noted under the "Patient Instructions" section below.***  When available, results of the above investigations and possible further recommendations will be communicated to the patient via telephone/MyChart. Patient to call office if not contacted after expected testing turnaround time.   Total time spent reviewing records, interview,  history/exam, documentation, and coordination of care on day of encounter:  *** min   Thank you for allowing me to participate in patient's care.  If I can answer any additional questions, I would be pleased to do so.  Jacquelyne Balint, MD   CC: Lula Olszewski, MD [address] [place] [address]  CC: Referring provider:  Lula Olszewski, MD [address] [place],  [place] [address]

## 2023-03-01 ENCOUNTER — Inpatient Hospital Stay (HOSPITAL_COMMUNITY)
Admission: EM | Admit: 2023-03-01 | Discharge: 2023-03-02 | DRG: 638 | Disposition: A | Discharge status: Home / Self Care | Payer: 59 | Attending: Internal Medicine | Admitting: Internal Medicine

## 2023-03-01 ENCOUNTER — Emergency Department (HOSPITAL_COMMUNITY): Payer: 59

## 2023-03-01 ENCOUNTER — Other Ambulatory Visit: Payer: Self-pay

## 2023-03-01 ENCOUNTER — Other Ambulatory Visit (HOSPITAL_COMMUNITY): Payer: Self-pay

## 2023-03-01 ENCOUNTER — Telehealth (HOSPITAL_COMMUNITY): Payer: Self-pay | Admitting: Pharmacy Technician

## 2023-03-01 ENCOUNTER — Encounter (HOSPITAL_COMMUNITY): Payer: Self-pay

## 2023-03-01 DIAGNOSIS — E1165 Type 2 diabetes mellitus with hyperglycemia: Secondary | ICD-10-CM | POA: Diagnosis not present

## 2023-03-01 DIAGNOSIS — E785 Hyperlipidemia, unspecified: Secondary | ICD-10-CM | POA: Diagnosis not present

## 2023-03-01 DIAGNOSIS — E7800 Pure hypercholesterolemia, unspecified: Secondary | ICD-10-CM | POA: Diagnosis present

## 2023-03-01 DIAGNOSIS — I10 Essential (primary) hypertension: Secondary | ICD-10-CM

## 2023-03-01 DIAGNOSIS — Z79890 Hormone replacement therapy (postmenopausal): Secondary | ICD-10-CM

## 2023-03-01 DIAGNOSIS — G4733 Obstructive sleep apnea (adult) (pediatric): Secondary | ICD-10-CM | POA: Diagnosis present

## 2023-03-01 DIAGNOSIS — E039 Hypothyroidism, unspecified: Secondary | ICD-10-CM | POA: Diagnosis present

## 2023-03-01 DIAGNOSIS — Z888 Allergy status to other drugs, medicaments and biological substances status: Secondary | ICD-10-CM

## 2023-03-01 DIAGNOSIS — D72829 Elevated white blood cell count, unspecified: Secondary | ICD-10-CM | POA: Diagnosis present

## 2023-03-01 DIAGNOSIS — E1110 Type 2 diabetes mellitus with ketoacidosis without coma: Secondary | ICD-10-CM

## 2023-03-01 DIAGNOSIS — F32A Depression, unspecified: Secondary | ICD-10-CM | POA: Diagnosis present

## 2023-03-01 DIAGNOSIS — Z79899 Other long term (current) drug therapy: Secondary | ICD-10-CM | POA: Diagnosis not present

## 2023-03-01 DIAGNOSIS — E119 Type 2 diabetes mellitus without complications: Secondary | ICD-10-CM | POA: Diagnosis not present

## 2023-03-01 DIAGNOSIS — Z7984 Long term (current) use of oral hypoglycemic drugs: Secondary | ICD-10-CM

## 2023-03-01 DIAGNOSIS — Z7985 Long-term (current) use of injectable non-insulin antidiabetic drugs: Secondary | ICD-10-CM

## 2023-03-01 DIAGNOSIS — E781 Pure hyperglyceridemia: Secondary | ICD-10-CM | POA: Diagnosis present

## 2023-03-01 DIAGNOSIS — Z8249 Family history of ischemic heart disease and other diseases of the circulatory system: Secondary | ICD-10-CM | POA: Diagnosis not present

## 2023-03-01 DIAGNOSIS — Z833 Family history of diabetes mellitus: Secondary | ICD-10-CM | POA: Diagnosis not present

## 2023-03-01 DIAGNOSIS — Z794 Long term (current) use of insulin: Secondary | ICD-10-CM

## 2023-03-01 DIAGNOSIS — Z83438 Family history of other disorder of lipoprotein metabolism and other lipidemia: Secondary | ICD-10-CM | POA: Diagnosis not present

## 2023-03-01 DIAGNOSIS — R531 Weakness: Secondary | ICD-10-CM | POA: Diagnosis present

## 2023-03-01 DIAGNOSIS — N179 Acute kidney failure, unspecified: Secondary | ICD-10-CM

## 2023-03-01 DIAGNOSIS — Z6841 Body Mass Index (BMI) 40.0 and over, adult: Secondary | ICD-10-CM | POA: Diagnosis not present

## 2023-03-01 HISTORY — DX: Type 2 diabetes mellitus with ketoacidosis without coma: E11.10

## 2023-03-01 LAB — URINALYSIS, ROUTINE W REFLEX MICROSCOPIC
Bilirubin Urine: NEGATIVE
Glucose, UA: >=500 mg/dL — AB
Hgb urine dipstick: NEGATIVE
Ketones, ur: 80 mg/dL — AB
Nitrite: NEGATIVE
Protein, ur: 30 mg/dL — AB
Specific Gravity, Urine: 1.023 (ref 1.005–1.030)
pH: 5 (ref 5.0–8.0)

## 2023-03-01 LAB — I-STAT CHEM 8, ED
BUN: 29 mg/dL — ABNORMAL HIGH (ref 6–20)
Calcium, Ion: 1.22 mmol/L (ref 1.15–1.40)
Chloride: 100 mmol/L (ref 98–111)
Creatinine, Ser: 0.9 mg/dL (ref 0.44–1.00)
Glucose, Bld: 522 mg/dL (ref 70–99)
HCT: 49 % — ABNORMAL HIGH (ref 36.0–46.0)
Hemoglobin: 16.7 g/dL — ABNORMAL HIGH (ref 12.0–15.0)
Potassium: 4.5 mmol/L (ref 3.5–5.1)
Sodium: 132 mmol/L — ABNORMAL LOW (ref 135–145)
TCO2: 14 mmol/L — ABNORMAL LOW (ref 22–32)

## 2023-03-01 LAB — BASIC METABOLIC PANEL
Anion gap: 27 — ABNORMAL HIGH (ref 5–15)
Anion gap: 12 (ref 5–15)
Anion gap: 9 (ref 5–15)
BUN: 27 mg/dL — ABNORMAL HIGH (ref 6–20)
BUN: 18 mg/dL (ref 6–20)
BUN: 11 mg/dL (ref 6–20)
CO2: 12 mmol/L — ABNORMAL LOW (ref 22–32)
CO2: 17 mmol/L — ABNORMAL LOW (ref 22–32)
CO2: 22 mmol/L (ref 22–32)
Calcium: 10.1 mg/dL (ref 8.9–10.3)
Calcium: 8.9 mg/dL (ref 8.9–10.3)
Calcium: 9 mg/dL (ref 8.9–10.3)
Chloride: 94 mmol/L — ABNORMAL LOW (ref 98–111)
Chloride: 106 mmol/L (ref 98–111)
Chloride: 103 mmol/L (ref 98–111)
Creatinine, Ser: 1.51 mg/dL — ABNORMAL HIGH (ref 0.44–1.00)
Creatinine, Ser: 0.9 mg/dL (ref 0.44–1.00)
Creatinine, Ser: 0.77 mg/dL (ref 0.44–1.00)
GFR, Estimated: 41 mL/min — ABNORMAL LOW (ref >60)
GFR, Estimated: >60 mL/min (ref >60)
GFR, Estimated: >60 mL/min (ref >60)
Glucose, Bld: 516 mg/dL (ref 70–99)
Glucose, Bld: 246 mg/dL — ABNORMAL HIGH (ref 70–99)
Glucose, Bld: 225 mg/dL — ABNORMAL HIGH (ref 70–99)
Potassium: 4.5 mmol/L (ref 3.5–5.1)
Potassium: 3.8 mmol/L (ref 3.5–5.1)
Potassium: 3.1 mmol/L — ABNORMAL LOW (ref 3.5–5.1)
Sodium: 133 mmol/L — ABNORMAL LOW (ref 135–145)
Sodium: 135 mmol/L (ref 135–145)
Sodium: 134 mmol/L — ABNORMAL LOW (ref 135–145)

## 2023-03-01 LAB — OSMOLALITY: Osmolality: 333 mosm/kg (ref 275–295)

## 2023-03-01 LAB — CBC WITH DIFFERENTIAL/PLATELET
Abs Immature Granulocytes: 0.06 10*3/uL (ref 0.00–0.07)
Basophils Absolute: 0.1 10*3/uL (ref 0.0–0.1)
Basophils Relative: 1 %
Eosinophils Absolute: 0.1 10*3/uL (ref 0.0–0.5)
Eosinophils Relative: 1 %
HCT: 47.9 % — ABNORMAL HIGH (ref 36.0–46.0)
Hemoglobin: 16.1 g/dL — ABNORMAL HIGH (ref 12.0–15.0)
Immature Granulocytes: 1 %
Lymphocytes Relative: 26 %
Lymphs Abs: 3.5 10*3/uL (ref 0.7–4.0)
MCH: 31.6 pg (ref 26.0–34.0)
MCHC: 33.6 g/dL (ref 30.0–36.0)
MCV: 94.1 fL (ref 80.0–100.0)
Monocytes Absolute: 1 10*3/uL (ref 0.1–1.0)
Monocytes Relative: 7 %
Neutro Abs: 8.6 10*3/uL — ABNORMAL HIGH (ref 1.7–7.7)
Neutrophils Relative %: 64 %
Platelets: 339 10*3/uL (ref 150–400)
RBC: 5.09 MIL/uL (ref 3.87–5.11)
RDW: 13.7 % (ref 11.5–15.5)
WBC: 13.2 10*3/uL — ABNORMAL HIGH (ref 4.0–10.5)
nRBC: 0 % (ref 0.0–0.2)

## 2023-03-01 LAB — CBG MONITORING, ED
Glucose-Capillary: 531 mg/dL (ref 70–99)
Glucose-Capillary: 433 mg/dL — ABNORMAL HIGH (ref 70–99)
Glucose-Capillary: 347 mg/dL — ABNORMAL HIGH (ref 70–99)
Glucose-Capillary: 278 mg/dL — ABNORMAL HIGH (ref 70–99)
Glucose-Capillary: 226 mg/dL — ABNORMAL HIGH (ref 70–99)
Glucose-Capillary: 250 mg/dL — ABNORMAL HIGH (ref 70–99)
Glucose-Capillary: 246 mg/dL — ABNORMAL HIGH (ref 70–99)
Glucose-Capillary: 205 mg/dL — ABNORMAL HIGH (ref 70–99)

## 2023-03-01 LAB — HCG, SERUM, QUALITATIVE: Preg, Serum: NEGATIVE

## 2023-03-01 LAB — TSH: TSH: 0.238 u[IU]/mL — ABNORMAL LOW (ref 0.350–4.500)

## 2023-03-01 LAB — HIV ANTIBODY (ROUTINE TESTING W REFLEX): HIV Screen 4th Generation wRfx: NONREACTIVE

## 2023-03-01 LAB — BETA-HYDROXYBUTYRIC ACID: Beta-Hydroxybutyric Acid: 0.85 mmol/L — ABNORMAL HIGH (ref 0.05–0.27)

## 2023-03-01 LAB — GLUCOSE, CAPILLARY
Glucose-Capillary: 243 mg/dL — ABNORMAL HIGH (ref 70–99)
Glucose-Capillary: 206 mg/dL — ABNORMAL HIGH (ref 70–99)

## 2023-03-01 MED ORDER — LACTATED RINGERS IV SOLN: INTRAVENOUS | Status: DC

## 2023-03-01 MED ORDER — LEVOTHYROXINE SODIUM 100 MCG PO TABS
100.0000 ug | ORAL_TABLET | Freq: Every day | ORAL | Status: DC
  Administered 2023-03-02: 100 ug via ORAL
  Filled 2023-03-01: qty 1

## 2023-03-01 MED ORDER — LORATADINE 10 MG PO TABS
10.0000 mg | ORAL_TABLET | Freq: Every evening | ORAL | Status: DC
  Administered 2023-03-01: 10 mg via ORAL
  Filled 2023-03-01: qty 1

## 2023-03-01 MED ORDER — HYDRALAZINE HCL 20 MG/ML IJ SOLN: 10.0000 mg | INTRAMUSCULAR | Status: DC | PRN

## 2023-03-01 MED ORDER — ONDANSETRON HCL 4 MG/2ML IJ SOLN
4.0000 mg | Freq: Once | INTRAMUSCULAR | Status: AC
  Administered 2023-03-01: 4 mg via INTRAVENOUS
  Filled 2023-03-01: qty 2

## 2023-03-01 MED ORDER — AZELASTINE HCL 0.1 % NA SOLN: 1.0000 | Freq: Every day | NASAL | Status: DC | PRN

## 2023-03-01 MED ORDER — SALINE SPRAY 0.65 % NA SOLN: 1.0000 | NASAL | Status: DC | PRN

## 2023-03-01 MED ORDER — ALBUTEROL SULFATE (2.5 MG/3ML) 0.083% IN NEBU
2.5000 mg | INHALATION_SOLUTION | Freq: Four times a day (QID) | RESPIRATORY_TRACT | Status: DC | PRN
Start: 2023-03-01 — End: 2023-03-02

## 2023-03-01 MED ORDER — LIVING WELL WITH DIABETES BOOK
Freq: Once | Status: AC
  Filled 2023-03-01: qty 1

## 2023-03-01 MED ORDER — FLUTICASONE PROPIONATE 50 MCG/ACT NA SUSP: 1.0000 | Freq: Every day | NASAL | Status: DC | PRN

## 2023-03-01 MED ORDER — TOPIRAMATE 25 MG PO TABS
25.0000 mg | ORAL_TABLET | Freq: Two times a day (BID) | ORAL | Status: DC
  Administered 2023-03-01 – 2023-03-02 (×2): 25 mg via ORAL
  Filled 2023-03-01 (×2): qty 1

## 2023-03-01 MED ORDER — AZELASTINE-FLUTICASONE 137-50 MCG/ACT NA SUSP
1.0000 | Freq: Every day | NASAL | Status: DC | PRN
Start: 2023-03-01 — End: 2023-03-01

## 2023-03-01 MED ORDER — LACTATED RINGERS IV BOLUS
20.0000 mL/kg | Freq: Once | INTRAVENOUS | Status: AC
  Administered 2023-03-01: 2190 mL via INTRAVENOUS

## 2023-03-01 MED ORDER — MONTELUKAST SODIUM 10 MG PO TABS
10.0000 mg | ORAL_TABLET | Freq: Every evening | ORAL | Status: DC
  Administered 2023-03-01: 10 mg via ORAL
  Filled 2023-03-01: qty 1

## 2023-03-01 MED ORDER — DEXTROSE IN LACTATED RINGERS 5 % IV SOLN: INTRAVENOUS | Status: AC

## 2023-03-01 MED ORDER — ESCITALOPRAM OXALATE 20 MG PO TABS
20.0000 mg | ORAL_TABLET | Freq: Every day | ORAL | Status: DC
  Administered 2023-03-01 – 2023-03-02 (×2): 20 mg via ORAL
  Filled 2023-03-01 (×2): qty 1

## 2023-03-01 MED ORDER — POTASSIUM CHLORIDE 10 MEQ/100ML IV SOLN
10.0000 meq | INTRAVENOUS | Status: AC
  Administered 2023-03-01 (×2): 10 meq via INTRAVENOUS
  Filled 2023-03-01 (×2): qty 100

## 2023-03-01 MED ORDER — DEXTROSE 50 % IV SOLN: 0.0000 mL | INTRAVENOUS | Status: DC | PRN

## 2023-03-01 MED ORDER — ENOXAPARIN SODIUM 40 MG/0.4ML IJ SOSY
40.0000 mg | PREFILLED_SYRINGE | INTRAMUSCULAR | Status: DC
  Administered 2023-03-01: 40 mg via SUBCUTANEOUS
  Filled 2023-03-01: qty 0.4

## 2023-03-01 MED ORDER — SODIUM CHLORIDE 0.9% FLUSH
3.0000 mL | Freq: Two times a day (BID) | INTRAVENOUS | Status: DC
  Administered 2023-03-01 – 2023-03-02 (×2): 3 mL via INTRAVENOUS

## 2023-03-01 MED ORDER — LACTATED RINGERS IV BOLUS
20.0000 mL/kg | Freq: Once | INTRAVENOUS | Status: AC
  Administered 2023-03-01: 2132 mL via INTRAVENOUS

## 2023-03-01 MED ORDER — INSULIN REGULAR(HUMAN) IN NACL 100-0.9 UT/100ML-% IV SOLN
INTRAVENOUS | Status: DC
  Administered 2023-03-01: 13 [IU]/h via INTRAVENOUS
  Administered 2023-03-02: 5.5 [IU]/h via INTRAVENOUS
  Filled 2023-03-01 (×2): qty 100

## 2023-03-01 MED ORDER — ACETAMINOPHEN 325 MG PO TABS: 650.0000 mg | ORAL_TABLET | Freq: Four times a day (QID) | ORAL | Status: DC | PRN

## 2023-03-01 MED ORDER — ACETAMINOPHEN 650 MG RE SUPP: 650.0000 mg | Freq: Four times a day (QID) | RECTAL | Status: DC | PRN

## 2023-03-01 MED ORDER — INSULIN STARTER KIT- PEN NEEDLES (ENGLISH)
1.0000 | Freq: Once | Status: AC
  Administered 2023-03-01: 1
  Filled 2023-03-01: qty 1

## 2023-03-01 MED ORDER — SALINE 0.9 % NA AERS
2.0000 | INHALATION_SPRAY | NASAL | Status: DC
Start: 2023-03-01 — End: 2023-03-01

## 2023-03-01 NOTE — Inpatient Diabetes Management (Addendum)
Inpatient Diabetes Program Recommendations  AACE/ADA: New Consensus Statement on Inpatient Glycemic Control (2015)  Target Ranges:  Prepandial:   less than 140 mg/dL      Peak postprandial:   less than 180 mg/dL (1-2 hours)      Critically ill patients:  140 - 180 mg/dL   Lab Results  Component Value Date   GLUCAP 226 (H) 03/01/2023   HGBA1C 11.1 (A) 02/23/2023    Review of Glycemic Control  Latest Reference Range & Units 03/01/23 11:08 03/01/23 11:41 03/01/23 12:44 03/01/23 14:02  Glucose-Capillary 70 - 99 mg/dL 161 (H) 096 (H) 045 (H) 226 (H)  (H): Data is abnormally high Diabetes history: New onset DM Outpatient Diabetes medications: Mounjaro Qwk, Metformin BID (hasn't started yet) Current orders for Inpatient glycemic control: IV insulin  Inpatient Diabetes Program Recommendations:    Consider adding beta hydroxybutyric acid Q8H x 2 occurrences and changing order to Hyperglycemia-DKA. secure chat sent to MD. Order provided for CGM use.  Spoke with patient at length regarding new onset diabetes. Patient was diagnosed by PCP last Thursday. Prior to this appointment, patient had already been trying to take GLPs for weight loss. Reports that she could not tolerate Ozempic due to nausea. Based on prescriptions at office visit, there was a delay on processing prescriptions (Metformin/Mounjaro) between insurance coverage and physician entry.  Of note, patient was prescribed Prednisone at the same time and was taking for asthma related symptoms. Reviewed patient's current A1c of 11.1%. Explained what a A1c is and what it measures. Also reviewed goal A1c with patient, importance of good glucose control @ home, and blood sugar goals. Reviewed patho of DM, DKA, role of pancreas, role of insulin, survival skills, interventions, vascular changes and commorbidities.  Patient has already received glucose meter and testing supplies. Discussed CGMs as another option and patient is interested.  Pharmacy contacted on options. Patient downloaded Freestyle libre 3 application because insurance tried to deny Dexcom. Will have team follow up closer to DC.  LWWDM, dietitian, outpatient education and insulin starter kit ordered. Will attach endocrinology list to DC summary.  Patient has been trying to lose weight and has avoided sugary beverages. Reviewed basic CHo counting, nutritional labels, importance of protein and general ways to encourage CHo  mindfulness.  Anticipate patient to require insulin.  Educated patient on insulin pen use at home. Reviewed contents of insulin flexpen starter kit. Reviewed all steps if insulin pen including attachment of needle, 2-unit air shot, dialing up dose, giving injection, removing needle, disposal of sharps, storage of unused insulin, disposal of insulin etc. Patient able to provide successful return demonstration. Also reviewed troubleshooting with insulin pen. MD to give patient Rxs for insulin pens and insulin pen needles.  Thanks, Lujean Rave, MSN, RNC-OB Diabetes Coordinator [phone] (8a-5p)

## 2023-03-01 NOTE — ED Provider Notes (Signed)
[place] EMERGENCY DEPARTMENT AT [facility] Provider Note   CSN: [id number] Arrival date & time: 03/01/23  8657     History  Chief Complaint  Patient presents with   Hyperglycemia    Debbie [name] is a 52 y.o. female.  HPI 52 year old female new onset diabetes presents today complaining of hyperglycemia, weakness, nausea but no vomiting.  She reports that she was recently diagnosed with new onset diabetes by primary care physician.  She had had some elevated A1c in the past.  She was on a cruise where she was drinking lots of water and felt very weak.  After arrival home she reports that she went to her primary care doctor was diagnosed.  She was started on metformin and insulin but has been unable to get her insulin filled.  She has been monitoring her blood sugar and noting it to continue to go up despite taking medications.  She continues to feel weak     Home Medications Prior to Admission medications   Medication Sig Start Date End Date Taking? Authorizing Provider  albuterol (VENTOLIN HFA) 108 (90 Base) MCG/ACT inhaler 1 PUFF AS NEEDED EVERY 4 HOURS FOR COUGH/WHEEZE INHALATION 90 DAYS    [provider]  Albuterol-Budesonide (AIRSUPRA) 90-80 MCG/ACT AERO Inhale 2 Inhalations into the lungs every 4 (four) hours as needed. Replaces albuterol, rescue inhaler 02/23/23   Lula Olszewski, MD  Ascorbic Acid (VITAMIN C) 1000 MG tablet Take 1,000 mg by mouth daily.    [provider]  Azelastine-Fluticasone 137-50 MCG/ACT SUSP PLACE 1 SPRAY INTO THE NOSE EVERY 12 (TWELVE) HOURS. 07/21/21   Janeece Agee, NP  benzonatate (TESSALON PERLES) 100 MG capsule Take 1 capsule (100 mg total) by mouth 3 (three) times daily. 02/23/23   Lula Olszewski, MD  BIOTIN PO Take 1 tablet by mouth daily.    [provider]  Blood Glucose Monitoring Suppl DEVI 1 each by Does not apply route in the morning, at noon, and at bedtime. May substitute to any manufacturer  covered by patient's insurance. 02/23/23   Lula Olszewski, MD  Blood Glucose Monitoring Suppl DEVI 1 each by Does not apply route in the morning, at noon, and at bedtime. May substitute to any manufacturer covered by patient's insurance. 02/26/23   Lula Olszewski, MD  cetirizine (ZYRTEC) 10 MG tablet Take 10 mg by mouth daily.    [provider]  Cholecalciferol (D3-1000 PO) Take 1,000 Units by mouth daily.    [provider]  Continuous Glucose Sensor (DEXCOM G7 SENSOR) MISC 1 Act by Does not apply route daily. 02/23/23   Lula Olszewski, MD  Cyanocobalamin (VITAMIN B12 PO) Take by mouth daily.    [provider]  DANDELION ROOT PO Take by mouth.    [provider]  dextromethorphan-guaiFENesin (TUSSIN DM) 10-100 MG/5ML liquid Take 10 mLs by mouth every 4 (four) hours as needed for cough. 02/23/23   Lula Olszewski, MD  escitalopram (LEXAPRO) 20 MG tablet TAKE 1 TABLET BY MOUTH EVERY DAY 12/21/22   Lula Olszewski, MD  glucose blood (ACCU-CHEK GUIDE TEST) test strip Use as instructed 02/27/23   Lula Olszewski, MD  hydrochlorothiazide (HYDRODIURIL) 25 MG tablet     [provider]  ibuprofen (ADVIL) 600 MG tablet Take 600 mg by mouth every 8 (eight) hours as needed for cramping, moderate pain, mild pain, headache or fever. 10/15/21   [provider]  Lancet Device MISC 1 each  by Does not apply route in the morning, at noon, and at bedtime. May substitute to any manufacturer covered by patient's insurance. 02/26/23 03/28/23  Lula Olszewski, MD  Lancets Misc. MISC 1 each by Does not apply route in the morning, at noon, and at bedtime. May substitute to any manufacturer covered by patient's insurance. 02/26/23 03/28/23  Lula Olszewski, MD  levocetirizine (XYZAL) 5 MG tablet Take 1 tablet (5 mg total) by mouth every evening. 01/16/23   Lula Olszewski, MD  levothyroxine (SYNTHROID) 100 MCG tablet TAKE 1 TABLET BY MOUTH EVERY DAY 12/21/22   Lula Olszewski, MD  losartan-hydrochlorothiazide (HYZAAR) 50-12.5 MG tablet TAKE 1 TABLET BY MOUTH DAILY. OK TO START WITH JUST HALF TABLET DAILY FOR WEEK 1 AND LEAVE IT THERE IF GOAL OF 140/90 REACHED. 10/12/22   Lula Olszewski, MD  meclizine (ANTIVERT) 25 MG tablet Take 1 tablet (25 mg total) by mouth 3 (three) times daily as needed for dizziness. 07/11/22   Lula Olszewski, MD  metFORMIN (GLUCOPHAGE) 500 MG tablet Take 1 tablet (500 mg total) by mouth 2 (two) times daily with a meal. 02/26/23   Lula Olszewski, MD  montelukast (SINGULAIR) 10 MG tablet Take 1 tablet by mouth every evening. 07/13/15   [provider]  Multiple Vitamins-Minerals (HAIR/SKIN/NAILS) CAPS Take 1 capsule by mouth daily.    [provider]  Multiple Vitamins-Minerals (MULTIVITAMIN WOMEN 50+) TABS Take 1 tablet by mouth 1 day or 1 dose.    [provider]  NEOMYCIN-POLYMYXIN-HYDROCORTISONE (CORTISPORIN) 1 % SOLN OTIC solution Place 1-3 drops into both ears every 8 (eight) hours as needed. 08/31/21   Shade Flood, MD  predniSONE (DELTASONE) 20 MG tablet Take 2 pills for 3 days, 1 pill for 4 days 02/23/23   Lula Olszewski, MD  promethazine-dextromethorphan (PROMETHAZINE-DM) 6.25-15 MG/5ML syrup Take 5 mLs by mouth 4 (four) times daily as needed for cough. 01/24/23   Lula Olszewski, MD  pseudoephedrine (SUDAFED 12 HOUR) 120 MG 12 hr tablet Take 1 tablet (120 mg total) by mouth 2 (two) times daily. 01/16/23   Lula Olszewski, MD  rosuvastatin (CRESTOR) 20 MG tablet TAKE 1 TABLET BY MOUTH EVERY DAY 12/21/22   Lula Olszewski, MD  Saline (SIMPLY SALINE) 0.9 % AERS Place 2 each into the nose as directed. Use nightly for sinus hygiene long-term.  Can also be used as many times daily as desired to assist with clearing congested sinuses. 01/16/23   Lula Olszewski, MD  Semaglutide-Weight Management 0.5 MG/0.5ML SOAJ Inject 0.5 mg into the skin once a week for 28 days. 02/22/23 03/22/23  Lula Olszewski, MD   Semaglutide-Weight Management 1 MG/0.5ML SOAJ Inject 1 mg into the skin once a week for 28 days. 03/23/23 04/20/23  Lula Olszewski, MD  Semaglutide-Weight Management 1.7 MG/0.75ML SOAJ Inject 1.7 mg into the skin once a week for 28 days. 04/21/23 05/19/23  Lula Olszewski, MD  Semaglutide-Weight Management 2.4 MG/0.75ML SOAJ Inject 2.4 mg into the skin once a week. 05/20/23 04/20/24  Lula Olszewski, MD  tirzepatide [facility]) 2.5 MG/0.5ML Pen Inject 2.5 mg into the skin once a week. 02/26/23   Lula Olszewski, MD  topiramate (TOPAMAX) 25 MG tablet TAKE 1 TABLET (25 MG TOTAL) BY MOUTH 2 (TWO) TIMES DAILY. TAKE JUST ONE TABLET DAILY FOR THE FIRST WEEK. 10/12/22   Lula Olszewski, MD  WEGOVY 0.25 MG/0.5ML SOAJ Inject 0.25 mg into the skin once  a week. 02/06/23   [provider]  Zinc 50 MG TABS Take by mouth daily.    [provider]      Allergies    Atorvastatin and Other    Review of Systems   Review of Systems  Physical Exam Updated Vital Signs BP (!) 138/92   Pulse 99   Temp 97.9 F (36.6 C) (Oral)   Resp (!) 24   Ht 1.626 m (5\' 4" )   Wt 106.6 kg   SpO2 99%   BMI 40.34 kg/m  Physical Exam Vitals and nursing note reviewed.  Constitutional:      General: She is not in acute distress.    Appearance: She is well-developed.  HENT:     Head: Normocephalic and atraumatic.     Right Ear: External ear normal.     Left Ear: External ear normal.     Nose: Nose normal.  Eyes:     Conjunctiva/sclera: Conjunctivae normal.     Pupils: Pupils are equal, round, and reactive to light.  Cardiovascular:     Rate and Rhythm: Normal rate and regular rhythm.  Pulmonary:     Effort: Pulmonary effort is normal.  Abdominal:     General: Abdomen is flat. Bowel sounds are normal.     Palpations: Abdomen is soft.  Musculoskeletal:        General: Normal range of motion.     Cervical back: Normal range of motion and neck supple.  Skin:    General: Skin is warm and dry.   Neurological:     Mental Status: She is alert and oriented to person, place, and time.     Motor: No abnormal muscle tone.     Coordination: Coordination normal.  Psychiatric:        Behavior: Behavior normal.        Thought Content: Thought content normal.     ED Results / Procedures / Treatments   Labs (all labs ordered are listed, but only abnormal results are displayed) Labs Reviewed  BASIC METABOLIC PANEL - Abnormal; Notable for the following components:      Result Value   Sodium 133 (*)    Chloride 94 (*)    CO2 12 (*)    Glucose, Bld 516 (*)    BUN 27 (*)    Creatinine, Ser 1.51 (*)    GFR, Estimated 41 (*)    Anion gap 27 (*)    All other components within normal limits  OSMOLALITY - Abnormal; Notable for the following components:   Osmolality 333 (*)    All other components within normal limits  CBC WITH DIFFERENTIAL/PLATELET - Abnormal; Notable for the following components:   WBC 13.2 (*)    Hemoglobin 16.1 (*)    HCT 47.9 (*)    Neutro Abs 8.6 (*)    All other components within normal limits  CBG MONITORING, ED - Abnormal; Notable for the following components:   Glucose-Capillary 531 (*)    All other components within normal limits  I-STAT CHEM 8, ED - Abnormal; Notable for the following components:   Sodium 132 (*)    BUN 29 (*)    Glucose, Bld 522 (*)    TCO2 14 (*)    Hemoglobin 16.7 (*)    HCT 49.0 (*)    All other components within normal limits  BASIC METABOLIC PANEL  BASIC METABOLIC PANEL  BASIC METABOLIC PANEL  URINALYSIS, ROUTINE W REFLEX MICROSCOPIC  HCG, SERUM, QUALITATIVE  CBG  MONITORING, ED    EKG EKG Interpretation Date/Time:  Wednesday March 01 2023 09:37:20 EST Ventricular Rate:  99 PR Interval:  133 QRS Duration:  97 QT Interval:  364 QTC Calculation: 468 R Axis:   28  Text Interpretation: Sinus rhythm Low voltage, precordial leads Confirmed by Margarita Grizzle [phone]) on 03/01/2023 10:19:03 AM  Radiology DG Chest 1  View  Result Date: 03/01/2023 CLINICAL DATA:  Weakness. EXAM: CHEST  1 VIEW COMPARISON:  October 25, 2012. FINDINGS: The heart size and mediastinal contours are within normal limits. Both lungs are clear. The visualized skeletal structures are unremarkable. IMPRESSION: No active disease. Electronically Signed   By: Lupita Raider M.D.   On: 03/01/2023 10:10    Procedures .Critical Care  Performed by: Margarita Grizzle, MD Authorized by: Margarita Grizzle, MD   Critical care provider statement:    Critical care time (minutes):  45   Critical care end time:  03/01/2023 10:32 AM   Critical care was necessary to treat or prevent imminent or life-threatening deterioration of the following conditions:  Dehydration and endocrine crisis   Critical care was time spent personally by me on the following activities:  Discussions with consultants, evaluation of patient's response to treatment, examination of patient, re-evaluation of patient's condition, ordering and review of laboratory studies, ordering and performing treatments and interventions and ordering and review of radiographic studies     Medications Ordered in ED Medications  lactated ringers bolus 2,132 mL (has no administration in time range)  insulin regular, human (MYXREDLIN) 100 units/ 100 mL infusion (has no administration in time range)  lactated ringers infusion (has no administration in time range)  dextrose 5 % in lactated ringers infusion (has no administration in time range)  dextrose 50 % solution 0-50 mL (has no administration in time range)  potassium chloride 10 mEq in 100 mL IVPB (10 mEq Intravenous New Bag/Given 03/01/23 1032)  lactated ringers bolus 2,190 mL (2,190 mLs Intravenous New Bag/Given 03/01/23 0923)  ondansetron (ZOFRAN) injection 4 mg (4 mg Intravenous Given 03/01/23 6045)    ED Course/ Medical Decision Making/ A&P                                 Medical Decision Making Amount and/or Complexity of Data  Reviewed Labs: ordered. Radiology: ordered.  Risk Prescription drug management.   52 year old female new onset diabetes presents today with generalized weakness and nausea. Differential diagnosis includes but is not limited to hyperglycemic crisis including DKA, hyperosmolar crisis, infection, volume depletion, electrolyte abnormality Patient is evaluated with labs, CBC, osmolality, basic metabolic panel. CO2 is normal Patient has elevated osmolality at 333 with blood sugar 531 IV fluids are infusing Patient is given IV fluid bolus and insulin infusion is started Plan admission for hyperosmolar glycemic crisis Discussed with Dr. Katrinka Blazing who will see for admission       Final Clinical Impression(s) / ED Diagnoses Final diagnoses:  Hyperglycemic crisis due to diabetes mellitus [facility])    Rx / DC Orders ED Discharge Orders     None         Margarita Grizzle, MD 03/01/23 1032

## 2023-03-01 NOTE — Telephone Encounter (Signed)
Pharmacy Patient Advocate Encounter  Insurance verification completed.    The patient is insured through [facility]. Patient has [employer], may use a copay card, and/or apply for patient assistance if available.    Ran test claim for Lantus and the current 30 day co-pay is 35.00. Ran test claim for Novolog and the current 30 day co-pay is 35.00. Ran test claim for Dexcom and requires a prior authorization. Ran test claim for Freestyle and is not preferred.   This test claim was processed through [facility]- copay amounts may vary at other pharmacies due to pharmacy/plan contracts, or as the patient moves through the different stages of their insurance plan.

## 2023-03-01 NOTE — Telephone Encounter (Signed)
Pharmacy Patient Advocate Encounter  Received notification from [facility] that Prior Authorization for Dexcom G7 Sensor has been APPROVED from 03/01/2023 to 02/29/2024. Ran test claim, Copay is $75.00. This test claim was processed through [facility]- copay amounts may vary at other pharmacies due to pharmacy/plan contracts, or as the patient moves through the different stages of their insurance plan.   PA #/Case ID/Reference #: [id number] Key: [id number]

## 2023-03-01 NOTE — H&P (Signed)
History and Physical    Patient: Debbie [name] DOB: September 15, 1970 DOA: 03/01/2023 DOS: the patient was seen and examined on 03/01/2023 PCP: Lula Olszewski, MD  Patient coming from: Home via EMS  Chief Complaint:  Chief Complaint  Patient presents with   Hyperglycemia   HPI: Fe Debbie [name] is a 52 y.o. female with medical history significant of hypertension, hyperlipidemia, hypothyroidism, OSA on CPAP, and morbid obesity who presents with complaints of generalized weakness  Patient was diagnosed with diabetes last Thursday.  For about a week prior to diagnosis, the patient experienced symptoms including lightheadedness, blurred vision, lack of energy, urinary frequency, dry mouth, and nausea.  Denied having any vomiting, cough, or shortness of breath.  She had been drinking a lot of water at home without improvement.  Her primary care provider had started metformin and Mounjaro.  However, she was unable to get the metformin medication until 2 days ago.  She was having difficulty with her insurance company to obtain Brenham as well, but states that it was able to be cleared today.  Patient into the emergency department patient was noted to be afebrile with blood pressures 138/92, normal vital signs maintained.  Labs significant for WBC 13.2, sodium 133, CO2 12, BUN 27, creatinine 1.51, glucose 516, urine osmolarity 333, and anion gap 27.  Chest x-ray had not noted any acute abnormality.  Urinalysis significant for greater than 500 glucose, 80 ketones, trace leukocytes, rare bacteria, 6-10 squamous epithelial cells/hpf, and 6-10 WBCs.  t.  Patient had been given over 4 L of lactated Ringer's, Zofran.  Potassium chloride 20 mEq IV, and orders were placed to start insulin drip per protocol.  Review of Systems: As mentioned in the history of present illness. All other systems reviewed and are negative. Past Medical History:  Diagnosis Date   Acute renal failure (ARF) (HCC) 10/01/2015    Allergy    Depression    Elevated cholesterol    History of endometrial ablation 05/20/2021   2021   Hyperlipidemia    Hypertension    Hypertriglyceridemia 02/08/2022   No components found for: "TG" Lab Results Component Value Date/Time  TRIG 125.0 08/18/2021 08:53 AM  TRIG 163.0 (H) 12/29/2020 09:20 AM  TRIG 148.0 08/04/2020 08:24 AM  TRIG 177.0 (H) 04/03/2019 07:30 AM     Hypothyroidism    Insomnia 11/08/2021   Morbid obesity (HCC) 02/08/2019   Seasonal allergies    Sleep apnea    wears cpap    Thyroid disease    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 Irena Cords    Past Surgical History:  Procedure Laterality Date   ABLATION  10/2019   ADENOIDECTOMY     COLONOSCOPY     DENTAL SURGERY     graft of mouth    Social History:  reports that she has never smoked. She has never used smokeless tobacco. She reports that she does not drink alcohol and does not use drugs.  Allergies  Allergen Reactions   Atorvastatin Hives   Other Shortness Of Breath    Family History  Problem Relation Age of Onset   Heart disease Father    Hypertension Mother    Hyperlipidemia Mother    Breast cancer Maternal Grandmother    Prostate cancer Maternal Grandfather    Diabetes Maternal Grandfather    CAD Maternal Grandfather    Colon cancer Neg Hx    Colon polyps Neg Hx    Esophageal  cancer Neg Hx    Stomach cancer Neg Hx    Rectal cancer Neg Hx     Prior to Admission medications   Medication Sig Start Date End Date Taking? Authorizing Provider  albuterol (VENTOLIN HFA) 108 (90 Base) MCG/ACT inhaler 1 PUFF AS NEEDED EVERY 4 HOURS FOR COUGH/WHEEZE INHALATION 90 DAYS    [provider]  Albuterol-Budesonide (AIRSUPRA) 90-80 MCG/ACT AERO Inhale 2 Inhalations into the lungs every 4 (four) hours as needed. Replaces albuterol, rescue inhaler 02/23/23   Lula Olszewski, MD  Ascorbic Acid (VITAMIN C) 1000 MG tablet Take 1,000 mg by  mouth daily.    [provider]  Azelastine-Fluticasone 137-50 MCG/ACT SUSP PLACE 1 SPRAY INTO THE NOSE EVERY 12 (TWELVE) HOURS. 07/21/21   Janeece Agee, NP  benzonatate (TESSALON PERLES) 100 MG capsule Take 1 capsule (100 mg total) by mouth 3 (three) times daily. 02/23/23   Lula Olszewski, MD  BIOTIN PO Take 1 tablet by mouth daily.    [provider]  Blood Glucose Monitoring Suppl DEVI 1 each by Does not apply route in the morning, at noon, and at bedtime. May substitute to any manufacturer covered by patient's insurance. 02/23/23   Lula Olszewski, MD  Blood Glucose Monitoring Suppl DEVI 1 each by Does not apply route in the morning, at noon, and at bedtime. May substitute to any manufacturer covered by patient's insurance. 02/26/23   Lula Olszewski, MD  cetirizine (ZYRTEC) 10 MG tablet Take 10 mg by mouth daily.    [provider]  Cholecalciferol (D3-1000 PO) Take 1,000 Units by mouth daily.    [provider]  Continuous Glucose Sensor (DEXCOM G7 SENSOR) MISC 1 Act by Does not apply route daily. 02/23/23   Lula Olszewski, MD  Cyanocobalamin (VITAMIN B12 PO) Take by mouth daily.    [provider]  DANDELION ROOT PO Take by mouth.    [provider]  dextromethorphan-guaiFENesin (TUSSIN DM) 10-100 MG/5ML liquid Take 10 mLs by mouth every 4 (four) hours as needed for cough. 02/23/23   Lula Olszewski, MD  escitalopram (LEXAPRO) 20 MG tablet TAKE 1 TABLET BY MOUTH EVERY DAY 12/21/22   Lula Olszewski, MD  glucose blood (ACCU-CHEK GUIDE TEST) test strip Use as instructed 02/27/23   Lula Olszewski, MD  hydrochlorothiazide (HYDRODIURIL) 25 MG tablet     [provider]  ibuprofen (ADVIL) 600 MG tablet Take 600 mg by mouth every 8 (eight) hours as needed for cramping, moderate pain, mild pain, headache or fever. 10/15/21   [provider]  Lancet Device MISC 1 each by Does not apply route in the morning, at noon, and at  bedtime. May substitute to any manufacturer covered by patient's insurance. 02/26/23 03/28/23  Lula Olszewski, MD  Lancets Misc. MISC 1 each by Does not apply route in the morning, at noon, and at bedtime. May substitute to any manufacturer covered by patient's insurance. 02/26/23 03/28/23  Lula Olszewski, MD  levocetirizine (XYZAL) 5 MG tablet Take 1 tablet (5 mg total) by mouth every evening. 01/16/23   Lula Olszewski, MD  levothyroxine (SYNTHROID) 100 MCG tablet TAKE 1 TABLET BY MOUTH EVERY DAY 12/21/22   Lula Olszewski, MD  losartan-hydrochlorothiazide (HYZAAR) 50-12.5 MG tablet TAKE 1 TABLET BY MOUTH DAILY. OK TO START WITH JUST HALF TABLET DAILY FOR WEEK 1 AND LEAVE IT THERE IF GOAL OF 140/90 REACHED. 10/12/22   Lula Olszewski, MD  meclizine Lezlie Octave)  25 MG tablet Take 1 tablet (25 mg total) by mouth 3 (three) times daily as needed for dizziness. 07/11/22   Lula Olszewski, MD  metFORMIN (GLUCOPHAGE) 500 MG tablet Take 1 tablet (500 mg total) by mouth 2 (two) times daily with a meal. 02/26/23   Lula Olszewski, MD  montelukast (SINGULAIR) 10 MG tablet Take 1 tablet by mouth every evening. 07/13/15   [provider]  Multiple Vitamins-Minerals (HAIR/SKIN/NAILS) CAPS Take 1 capsule by mouth daily.    [provider]  Multiple Vitamins-Minerals (MULTIVITAMIN WOMEN 50+) TABS Take 1 tablet by mouth 1 day or 1 dose.    [provider]  NEOMYCIN-POLYMYXIN-HYDROCORTISONE (CORTISPORIN) 1 % SOLN OTIC solution Place 1-3 drops into both ears every 8 (eight) hours as needed. 08/31/21   Shade Flood, MD  predniSONE (DELTASONE) 20 MG tablet Take 2 pills for 3 days, 1 pill for 4 days 02/23/23   Lula Olszewski, MD  promethazine-dextromethorphan (PROMETHAZINE-DM) 6.25-15 MG/5ML syrup Take 5 mLs by mouth 4 (four) times daily as needed for cough. 01/24/23   Lula Olszewski, MD  pseudoephedrine (SUDAFED 12 HOUR) 120 MG 12 hr tablet Take 1 tablet (120 mg total) by mouth 2 (two) times  daily. 01/16/23   Lula Olszewski, MD  rosuvastatin (CRESTOR) 20 MG tablet TAKE 1 TABLET BY MOUTH EVERY DAY 12/21/22   Lula Olszewski, MD  Saline (SIMPLY SALINE) 0.9 % AERS Place 2 each into the nose as directed. Use nightly for sinus hygiene long-term.  Can also be used as many times daily as desired to assist with clearing congested sinuses. 01/16/23   Lula Olszewski, MD  Semaglutide-Weight Management 0.5 MG/0.5ML SOAJ Inject 0.5 mg into the skin once a week for 28 days. 02/22/23 03/22/23  Lula Olszewski, MD  Semaglutide-Weight Management 1 MG/0.5ML SOAJ Inject 1 mg into the skin once a week for 28 days. 03/23/23 04/20/23  Lula Olszewski, MD  Semaglutide-Weight Management 1.7 MG/0.75ML SOAJ Inject 1.7 mg into the skin once a week for 28 days. 04/21/23 05/19/23  Lula Olszewski, MD  Semaglutide-Weight Management 2.4 MG/0.75ML SOAJ Inject 2.4 mg into the skin once a week. 05/20/23 04/20/24  Lula Olszewski, MD  tirzepatide [facility]) 2.5 MG/0.5ML Pen Inject 2.5 mg into the skin once a week. 02/26/23   Lula Olszewski, MD  topiramate (TOPAMAX) 25 MG tablet TAKE 1 TABLET (25 MG TOTAL) BY MOUTH 2 (TWO) TIMES DAILY. TAKE JUST ONE TABLET DAILY FOR THE FIRST WEEK. 10/12/22   Lula Olszewski, MD  WEGOVY 0.25 MG/0.5ML SOAJ Inject 0.25 mg into the skin once a week. 02/06/23   [provider]  Zinc 50 MG TABS Take by mouth daily.    [provider]    Physical Exam: Vitals:   03/01/23 0908 03/01/23 0911 03/01/23 0913 03/01/23 0915  BP:  (!) 135/107  (!) 138/92  Pulse:  99    Resp:  (!) 24    Temp:   97.9 F (36.6 C)   TempSrc:   Oral   SpO2:  99%    Weight: 106.6 kg     Height: 5\' 4"  (1.626 m)       Constitutional: Obese middle-aged female currently no acute distress Eyes: PERRL, lids and conjunctivae normal ENMT: Mucous membranes are moist.  Normal dentition.  Neck: normal, supple  Respiratory: clear to auscultation bilaterally, no wheezing, no crackles. Normal respiratory  effort. No accessory muscle use.  Cardiovascular: Regular rate and rhythm,  no murmurs / rubs / gallops. No extremity edema. 2+ pedal pulses.  Abdomen: no tenderness, no masses palpated.   Bowel sounds positive.  Musculoskeletal: no clubbing / cyanosis. No joint deformity upper and lower extremities. Good ROM, no contractures. Normal muscle tone.  Skin: no rashes, lesions, ulcers. No induration Neurologic: CN 2-12 grossly intact.  Strength appears to be 5/5 in all extremities. Psychiatric: Normal judgment and insight. Alert and oriented x 3. Normal mood.   Data Reviewed:  EKG reveals sinus rhythm at 99 bpm.  Reviewed labs, imaging, and pertinent records as documented.  Assessment and Plan:   DKA, type II Patient presents with reports of fatigue with nausea, dry mouth, urinary frequency, and weakness.  Glucose elevated up to 516, CO2 12, anion gap 27, and osmolarity 333.  Reports issues with obtaining her medications.  office.  Urinalysis was positive for glucose and ketones. Hemoglobin A1c was noted to be 11.11 when checked on 12/5 at her PCP.  She was started on hyperglycemia protocol with IV fluid bolus and insulin drip.  Findings appear more consistent with DKA. -Admit to stepdown unit  -Glucose stabilizer protocol initiated  -Hold metformin in the inpatient setting -Serial BMPs, beta hydroxybutyrate acid -Correct electrolytes as needed -Monitoring for AG closure and will transition to subcutaneous insulin once able -Social work and care management consult for need of PCP and barriers of -Diabetes education consulted,will follow-up for any further recommendations  Leukocytosis Acute.  WBC elevated at 13.2.  Chest x-ray noted no acute abnormality.  Urinalysis did not note significant signs for infection.  Suspect secondary to above. -Recheck CBC in a.m.  Acute kidney injury Patient's creatinine noted to be 1.51 with BUN 27.  Baseline creatinine previously noted to be 0.6-0.7. -Hold  possible nephrotoxic agents -Continue IV fluids as noted above  Essential hypertension Home blood pressure regimen includes losartan-hydrochlorothiazide -Held due to acute kidney injury.  Patient may need to follow-up with primary care provider regards in continuing hydrochlorothiazide -Hydralazine IV as needed for elevated blood pressures  Hyperlipidemia -Continue Crestor  Hypothyroidism -Add on TSH -continue levothyroxine  Morbid obesity BMI 40.34 kg/m.  Patient had been started on Mounjaro and was pending insurance approval which she states that she has currently. -Continue Mounjaro in the outpatient setting  DVT prophylaxis: Lovenox Advance Care Planning:   Code Status: Full Code    Consults: Diabetic education Family Communication: Mother updated at bedside  Severity of Illness: The appropriate patient status for this patient is INPATIENT. Inpatient status is judged to be reasonable and necessary in order to provide the required intensity of service to ensure the patient's safety. The patient's presenting symptoms, physical exam findings, and initial radiographic and laboratory data in the context of their chronic comorbidities is felt to place them at high risk for further clinical deterioration. Furthermore, it is not anticipated that the patient will be medically stable for discharge from the hospital within 2 midnights of admission.   * I certify that at the point of admission it is my clinical judgment that the patient will require inpatient hospital care spanning beyond 2 midnights from the point of admission due to high intensity of service, high risk for further deterioration and high frequency of surveillance required.*  Author: Clydie Braun, MD 03/01/2023 10:22 AM  For on call review [email or web address].

## 2023-03-01 NOTE — Discharge Instructions (Signed)
Local Endocrinologists [facility] [phone]) Dr. Carlus Pavlov Dr. Reather Littler [facility] [phone]) Dr. Talmage Coin [facility] [phone]) Dr. Dorisann Frames Dr. Darci Needle [facility] [phone]) Dr. Deirdre Pippins [facility] [phone]) [[place] office]  [phone]) Dan Humphreys office] Dr. Wendall Mola Dr. Verdis Frederickson [facility]) [phone]) Autumn Pearletha [place] Yetta Barre), [place] Dr. Izell  Dr. Jillyn Ledger. [facility] [phone]) Dr. Marquis Lunch Dr. Girtha Hake. [place] in [place] [phone]) [place]a [place], [place] [phone]) Daiva Nakayama- [place] Nora [place], [place] [phone])  =========================================================  Give these to your pharmacy to decrease copay to $35 per month

## 2023-03-01 NOTE — ED Triage Notes (Signed)
Pt arrives via ems to the er via ems for the c/o recently dx diabetes, insurance did not approve insulin. Cbg 574 per ems. Polyuria, very thirsty. Other vss per ems.

## 2023-03-01 NOTE — ED Notes (Signed)
Patient declined to have a bed alarm.

## 2023-03-01 NOTE — ED Notes (Signed)
ED TO INPATIENT HANDOFF REPORT  ED Nurse Name and Phone #: Ramon Brant RN  S Name/Age/Gender Debbie [name] 52 y.o. female Room/Bed: 003C/003C  Code Status   Code Status: Full Code  Home/SNF/Other Home Patient oriented to: self, place, time, and situation Is this baseline? Yes   Triage Complete: Triage complete  Chief Complaint DKA, type 2 (HCC) [E11.10]  Triage Note Pt arrives via ems to the er via ems for the c/o recently dx diabetes, insurance did not approve insulin. Cbg 574 per ems. Polyuria, very thirsty. Other vss per ems.    Allergies Allergies  Allergen Reactions   Atorvastatin Hives   Other Shortness Of Breath    Level of Care/Admitting Diagnosis ED Disposition     ED Disposition  Admit   Condition  --   Comment  Hospital [facility]: [facility] [[id number]]  Level of Care: Progressive [102]  Admit to Progressive based on following criteria: CARDIOVASCULAR & THORACIC of moderate stability with acute coronary syndrome symptoms/low risk myocardial infarction/hypertensive urgency/arrhythmias/heart failure potentially compromising stability and stable post cardiovascular intervention patients.  May admit patient to [facility] or [facility] if equivalent level of care is available:: No  Covid Evaluation: Asymptomatic - no recent exposure (last 10 days) testing not required  Diagnosis: DKA, type 2 [facility]) [[id number]]  Admitting Physician: Clydie Braun [[id number]]  Attending Physician: Clydie Braun [[id number]]  Certification:: I certify this patient will need inpatient services for at least 2 midnights  Expected Medical Readiness: 03/03/2023          B Medical/Surgery History Past Medical History:  Diagnosis Date   Acute renal failure (ARF) (HCC) 10/01/2015   Allergy    Depression    Elevated cholesterol    History of endometrial ablation 05/20/2021   2021   Hyperlipidemia    Hypertension    Hypertriglyceridemia 02/08/2022   No components  found for: "TG" Lab Results Component Value Date/Time  TRIG 125.0 08/18/2021 08:53 AM  TRIG 163.0 (H) 12/29/2020 09:20 AM  TRIG 148.0 08/04/2020 08:24 AM  TRIG 177.0 (H) 04/03/2019 07:30 AM     Hypothyroidism    Insomnia 11/08/2021   Morbid obesity (HCC) 02/08/2019   Seasonal allergies    Sleep apnea    wears cpap    Thyroid disease    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 Irena Cords    Past Surgical History:  Procedure Laterality Date   ABLATION  10/2019   ADENOIDECTOMY     COLONOSCOPY     DENTAL SURGERY     graft of mouth      A IV Location/Drains/Wounds Patient Lines/Drains/Airways Status     Active Line/Drains/Airways     Name Placement date Placement time Site Days   Peripheral IV 03/01/23 20 G 1.88" Anterior;Left Forearm 03/01/23  0914  Forearm  less than 1   Peripheral IV 03/01/23 18 G 1.88" Left Antecubital 03/01/23  1143  Antecubital  less than 1            Intake/Output Last 24 hours  Intake/Output Summary (Last 24 hours) at 03/01/2023 2106 Last data filed at 03/01/2023 1854 Gross per 24 hour  Intake 136.42 ml  Output --  Net 136.42 ml    Labs/Imaging Results for orders placed or performed during the hospital encounter of 03/01/23 (from the past 48 hour(s))  CBG monitoring, ED     Status: Abnormal   Collection Time: 03/01/23  9:02 AM  Result Value Ref Range   Glucose-Capillary 531 (HH) 70 - 99 mg/dL    Comment: Glucose reference range applies only to samples taken after fasting for at least 8 hours.  Basic metabolic panel     Status: Abnormal   Collection Time: 03/01/23  9:02 AM  Result Value Ref Range   Sodium 133 (L) 135 - 145 mmol/L   Potassium 4.5 3.5 - 5.1 mmol/L   Chloride 94 (L) 98 - 111 mmol/L   CO2 12 (L) 22 - 32 mmol/L   Glucose, Bld 516 (HH) 70 - 99 mg/dL    Comment: ATTEMPTED CALL AT 1020 CRITICAL RESULT CALLED TO, READ BACK BY AND VERIFIED WITH G. MILLER, RN 1023 03/01/23 L.  KLAR Glucose reference range applies only to samples taken after fasting for at least 8 hours.    BUN 27 (H) 6 - 20 mg/dL   Creatinine, Ser 1.61 (H) 0.44 - 1.00 mg/dL   Calcium 09.6 8.9 - 04.5 mg/dL   GFR, Estimated 41 (L) >60 mL/min    Comment: (NOTE) Calculated using the CKD-EPI Creatinine Equation (2021)    Anion gap 27 (H) 5 - 15    Comment: ELECTROLYTES REPEATED TO VERIFY Performed at [facility], [address], [place], [place] [address]   Osmolality     Status: Abnormal   Collection Time: 03/01/23  9:02 AM  Result Value Ref Range   Osmolality 333 (HH) 275 - 295 mOsm/kg    Comment: CRITICAL RESULT CALLED TO, READ BACK BY AND VERIFIED WITH:  CALLED TO J.MILLER RN @0959  03/01/2023 SB Performed at [facility], [address] [place], [place] [address]   CBC with Differential (PNL)     Status: Abnormal   Collection Time: 03/01/23  9:02 AM  Result Value Ref Range   WBC 13.2 (H) 4.0 - 10.5 K/uL   RBC 5.09 3.87 - 5.11 MIL/uL   Hemoglobin 16.1 (H) 12.0 - 15.0 g/dL   HCT 82.9 (H) 56.2 - 13.0 %   MCV 94.1 80.0 - 100.0 fL   MCH 31.6 26.0 - 34.0 pg   MCHC 33.6 30.0 - 36.0 g/dL   RDW 86.5 78.4 - 69.6 %   Platelets 339 150 - 400 K/uL   nRBC 0.0 0.0 - 0.2 %   Neutrophils Relative % 64 %   Neutro Abs 8.6 (H) 1.7 - 7.7 K/uL   Lymphocytes Relative 26 %   Lymphs Abs 3.5 0.7 - 4.0 K/uL   Monocytes Relative 7 %   Monocytes Absolute 1.0 0.1 - 1.0 K/uL   Eosinophils Relative 1 %   Eosinophils Absolute 0.1 0.0 - 0.5 K/uL   Basophils Relative 1 %   Basophils Absolute 0.1 0.0 - 0.1 K/uL   Immature Granulocytes 1 %   Abs Immature Granulocytes 0.06 0.00 - 0.07 K/uL    Comment: Performed at [facility], [address], [place], [place] [address]  Urinalysis, Routine w reflex microscopic -Urine, Clean Catch     Status: Abnormal   Collection Time: 03/01/23  9:02 AM  Result Value Ref Range   Color, Urine YELLOW YELLOW   APPearance HAZY (A) CLEAR   Specific Gravity,  Urine 1.023 1.005 - 1.030   pH 5.0 5.0 - 8.0   Glucose, UA >=500 (A) NEGATIVE mg/dL   Hgb urine dipstick NEGATIVE NEGATIVE   Bilirubin Urine NEGATIVE NEGATIVE   Ketones, ur 80 (A) NEGATIVE mg/dL   Protein, ur 30 (A) NEGATIVE mg/dL  Nitrite NEGATIVE NEGATIVE   Leukocytes,Ua TRACE (A) NEGATIVE   RBC / HPF 0-5 0 - 5 RBC/hpf   WBC, UA 6-10 0 - 5 WBC/hpf   Bacteria, UA RARE (A) NONE SEEN   Squamous Epithelial / HPF 6-10 0 - 5 /HPF   Mucus PRESENT    Budding Yeast PRESENT    Hyaline Casts, UA PRESENT     Comment: Performed at [facility], [address] [place], [place] [address]  I-stat chem 8, ED (not at [facility], [facility] or [facility])     Status: Abnormal   Collection Time: 03/01/23  9:21 AM  Result Value Ref Range   Sodium 132 (L) 135 - 145 mmol/L   Potassium 4.5 3.5 - 5.1 mmol/L   Chloride 100 98 - 111 mmol/L   BUN 29 (H) 6 - 20 mg/dL   Creatinine, Ser 6.04 0.44 - 1.00 mg/dL   Glucose, Bld 540 (HH) 70 - 99 mg/dL    Comment: Glucose reference range applies only to samples taken after fasting for at least 8 hours.   Calcium, Ion 1.22 1.15 - 1.40 mmol/L   TCO2 14 (L) 22 - 32 mmol/L   Hemoglobin 16.7 (H) 12.0 - 15.0 g/dL   HCT 98.1 (H) 19.1 - 47.8 %   Comment NOTIFIED PHYSICIAN   CBG monitoring, ED     Status: Abnormal   Collection Time: 03/01/23 11:08 AM  Result Value Ref Range   Glucose-Capillary 433 (H) 70 - 99 mg/dL    Comment: Glucose reference range applies only to samples taken after fasting for at least 8 hours.  CBG monitoring, ED     Status: Abnormal   Collection Time: 03/01/23 11:41 AM  Result Value Ref Range   Glucose-Capillary 347 (H) 70 - 99 mg/dL    Comment: Glucose reference range applies only to samples taken after fasting for at least 8 hours.  CBG monitoring, ED     Status: Abnormal   Collection Time: 03/01/23 12:44 PM  Result Value Ref Range   Glucose-Capillary 278 (H) 70 - 99 mg/dL    Comment: Glucose reference range applies only to samples taken after fasting  for at least 8 hours.  CBG monitoring, ED     Status: Abnormal   Collection Time: 03/01/23  2:02 PM  Result Value Ref Range   Glucose-Capillary 226 (H) 70 - 99 mg/dL    Comment: Glucose reference range applies only to samples taken after fasting for at least 8 hours.  TSH     Status: Abnormal   Collection Time: 03/01/23  2:42 PM  Result Value Ref Range   TSH 0.238 (L) 0.350 - 4.500 uIU/mL    Comment: Performed by a 3rd Generation assay with a functional sensitivity of <=0.01 uIU/mL. Performed at [facility], [address], [place], [place] [address]   Basic metabolic panel     Status: Abnormal   Collection Time: 03/01/23  2:43 PM  Result Value Ref Range   Sodium 135 135 - 145 mmol/L   Potassium 3.8 3.5 - 5.1 mmol/L   Chloride 106 98 - 111 mmol/L   CO2 17 (L) 22 - 32 mmol/L   Glucose, Bld 246 (H) 70 - 99 mg/dL    Comment: Glucose reference range applies only to samples taken after fasting for at least 8 hours.   BUN 18 6 - 20 mg/dL   Creatinine, Ser 1.30 0.44 - 1.00 mg/dL   Calcium 8.9 8.9 - 86.5 mg/dL  GFR, Estimated >60 >60 mL/min    Comment: (NOTE) Calculated using the CKD-EPI Creatinine Equation (2021)    Anion gap 12 5 - 15    Comment: Performed at [facility], [address] [place], [place] [address]  hCG, serum, qualitative     Status: None   Collection Time: 03/01/23  2:43 PM  Result Value Ref Range   Preg, Serum NEGATIVE NEGATIVE    Comment:        THE SENSITIVITY OF THIS METHODOLOGY IS >10 mIU/mL. Performed at [facility], [address], [place], [place] [address]   HIV Antibody (routine testing w rflx)     Status: None   Collection Time: 03/01/23  2:43 PM  Result Value Ref Range   HIV Screen 4th Generation wRfx Non Reactive Non Reactive    Comment: Performed at [facility], [address] [place], [place] [address]  CBG monitoring, ED     Status: Abnormal   Collection Time: 03/01/23  5:05 PM  Result Value Ref Range    Glucose-Capillary 250 (H) 70 - 99 mg/dL    Comment: Glucose reference range applies only to samples taken after fasting for at least 8 hours.  CBG monitoring, ED     Status: Abnormal   Collection Time: 03/01/23  6:50 PM  Result Value Ref Range   Glucose-Capillary 246 (H) 70 - 99 mg/dL    Comment: Glucose reference range applies only to samples taken after fasting for at least 8 hours.  CBG monitoring, ED     Status: Abnormal   Collection Time: 03/01/23  8:40 PM  Result Value Ref Range   Glucose-Capillary 205 (H) 70 - 99 mg/dL    Comment: Glucose reference range applies only to samples taken after fasting for at least 8 hours.   DG Chest 1 View  Result Date: 03/01/2023 CLINICAL DATA:  Weakness. EXAM: CHEST  1 VIEW COMPARISON:  October 25, 2012. FINDINGS: The heart size and mediastinal contours are within normal limits. Both lungs are clear. The visualized skeletal structures are unremarkable. IMPRESSION: No active disease. Electronically Signed   By: Lupita Raider M.D.   On: 03/01/2023 10:10    Pending Labs Unresulted Labs (From admission, onward)     Start     Ordered   03/02/23 0500  CBC  Tomorrow morning,   R        03/01/23 1041   03/02/23 0500  Basic metabolic panel  Tomorrow morning,   R        03/01/23 1041   03/01/23 2102  Basic metabolic panel  (Hyperglycemic Hyperosmolar State (HHS))  STAT Now then every 4 hours ,   STAT        03/01/23 1904   03/01/23 1529  Beta-hydroxybutyric acid  Now then every 6 hours,   R (with TIMED occurrences)      03/01/23 1528   03/01/23 1528  Urine Culture (for pregnant, neutropenic or urologic patients or patients with an indwelling urinary catheter)  (Urine Labs)  Once,   R       Question:  Indication  Answer:  Urgency/frequency   03/01/23 1527            Vitals/Pain Today's Vitals   03/01/23 1409 03/01/23 1715 03/01/23 1849 03/01/23 2030  BP:  132/77 123/74 125/78  Pulse:  94 93 91  Resp:  19 20 (!) 21  Temp: 99.1 F (37.3 C)      TempSrc:  Oral     SpO2:  97% 95% 96%  Weight:      Height:      PainSc:        Isolation Precautions No active isolations  Medications Medications  insulin regular, human (MYXREDLIN) 100 units/ 100 mL infusion (5.5 Units/hr Intravenous Infusion Verify 03/01/23 1854)  lactated ringers infusion ( Intravenous New Bag/Given 03/01/23 1143)  dextrose 5 % in lactated ringers infusion ( Intravenous New Bag/Given 03/01/23 1407)  dextrose 50 % solution 0-50 mL (has no administration in time range)  enoxaparin (LOVENOX) injection 40 mg (40 mg Subcutaneous Given 03/01/23 1237)  sodium chloride flush (NS) 0.9 % injection 3 mL (3 mLs Intravenous Not Given 03/01/23 1116)  acetaminophen (TYLENOL) tablet 650 mg (has no administration in time range)    Or  acetaminophen (TYLENOL) suppository 650 mg (has no administration in time range)  albuterol (PROVENTIL) (2.5 MG/3ML) 0.083% nebulizer solution 2.5 mg (has no administration in time range)  hydrALAZINE (APRESOLINE) injection 10 mg (has no administration in time range)  escitalopram (LEXAPRO) tablet 20 mg (has no administration in time range)  levothyroxine (SYNTHROID) tablet 100 mcg (has no administration in time range)  topiramate (TOPAMAX) tablet 25 mg (has no administration in time range)  Azelastine-Fluticasone 137-50 MCG/ACT SUSP 1 spray (has no administration in time range)  montelukast (SINGULAIR) tablet 10 mg (has no administration in time range)  Saline 0.9 % AERS 2 each (has no administration in time range)  loratadine (CLARITIN) tablet 10 mg (has no administration in time range)  lactated ringers bolus 2,190 mL (2,190 mLs Intravenous New Bag/Given 03/01/23 0923)  ondansetron (ZOFRAN) injection 4 mg (4 mg Intravenous Given 03/01/23 1610)  lactated ringers bolus 2,132 mL (2,132 mLs Intravenous New Bag/Given 03/01/23 1142)  potassium chloride 10 mEq in 100 mL IVPB (0 mEq Intravenous Stopped 03/01/23 1339)  insulin starter kit- pen needles  (English) 1 kit (1 kit Other Given 03/01/23 1745)  living well with diabetes book MISC ( Does not apply Given 03/01/23 1745)    Mobility walks     Focused Assessments Pulmonary Assessment Handoff:  Lung sounds:   O2 Device: Room Air      R Recommendations: See Admitting Provider Note  Report given to:   Additional Notes:

## 2023-03-01 NOTE — Progress Notes (Signed)
Patient admitted to room [id number] via ED with DKA. Insulin drip infusing and LR with Dextrose infusing at 5%. VSS. Alert and oriented X 4.

## 2023-03-02 ENCOUNTER — Other Ambulatory Visit (HOSPITAL_COMMUNITY): Payer: Self-pay

## 2023-03-02 DIAGNOSIS — E119 Type 2 diabetes mellitus without complications: Secondary | ICD-10-CM

## 2023-03-02 DIAGNOSIS — E1110 Type 2 diabetes mellitus with ketoacidosis without coma: Secondary | ICD-10-CM | POA: Diagnosis not present

## 2023-03-02 DIAGNOSIS — E039 Hypothyroidism, unspecified: Secondary | ICD-10-CM | POA: Diagnosis not present

## 2023-03-02 DIAGNOSIS — E1165 Type 2 diabetes mellitus with hyperglycemia: Secondary | ICD-10-CM

## 2023-03-02 LAB — CBC
HCT: 35.5 % — ABNORMAL LOW (ref 36.0–46.0)
Hemoglobin: 12.4 g/dL (ref 12.0–15.0)
MCH: 31.4 pg (ref 26.0–34.0)
MCHC: 34.9 g/dL (ref 30.0–36.0)
MCV: 89.9 fL (ref 80.0–100.0)
Platelets: 186 10*3/uL (ref 150–400)
RBC: 3.95 MIL/uL (ref 3.87–5.11)
RDW: 13.4 % (ref 11.5–15.5)
WBC: 6.8 10*3/uL (ref 4.0–10.5)
nRBC: 0 % (ref 0.0–0.2)

## 2023-03-02 LAB — GLUCOSE, CAPILLARY
Glucose-Capillary: 146 mg/dL — ABNORMAL HIGH (ref 70–99)
Glucose-Capillary: 147 mg/dL — ABNORMAL HIGH (ref 70–99)
Glucose-Capillary: 154 mg/dL — ABNORMAL HIGH (ref 70–99)
Glucose-Capillary: 175 mg/dL — ABNORMAL HIGH (ref 70–99)
Glucose-Capillary: 187 mg/dL — ABNORMAL HIGH (ref 70–99)
Glucose-Capillary: 195 mg/dL — ABNORMAL HIGH (ref 70–99)
Glucose-Capillary: 235 mg/dL — ABNORMAL HIGH (ref 70–99)

## 2023-03-02 MED ORDER — INSULIN GLARGINE-YFGN 100 UNIT/ML ~~LOC~~ SOLN
20.0000 [IU] | SUBCUTANEOUS | Status: DC
  Administered 2023-03-02: 20 [IU] via SUBCUTANEOUS
  Filled 2023-03-02: qty 0.2

## 2023-03-02 MED ORDER — INSULIN ASPART 100 UNIT/ML IJ SOLN: 0.0000 [IU] | Freq: Three times a day (TID) | INTRAMUSCULAR | Status: DC

## 2023-03-02 MED ORDER — INSULIN ASPART 100 UNIT/ML IJ SOLN
3.0000 [IU] | Freq: Three times a day (TID) | INTRAMUSCULAR | Status: DC
  Administered 2023-03-02: 3 [IU] via SUBCUTANEOUS

## 2023-03-02 MED ORDER — NOVOLOG FLEXPEN 100 UNIT/ML ~~LOC~~ SOPN
PEN_INJECTOR | SUBCUTANEOUS | 0 refills | Status: DC
Start: 2023-03-02 — End: 2023-03-28
  Filled 2023-03-02: qty 9, 30d supply, fill #0

## 2023-03-02 MED ORDER — INSULIN ASPART 100 UNIT/ML IJ SOLN
0.0000 [IU] | Freq: Every day | INTRAMUSCULAR | Status: DC
Start: 2023-03-02 — End: 2023-03-02

## 2023-03-02 MED ORDER — BLOOD GLUCOSE MONITORING SUPPL DEVI
1.0000 | Freq: Three times a day (TID) | 0 refills | Status: AC
  Filled 2023-03-02: qty 1, fill #0

## 2023-03-02 MED ORDER — LANCETS MISC. MISC
1.0000 | Freq: Three times a day (TID) | 0 refills | Status: AC
  Filled 2023-03-02: qty 100, 30d supply, fill #0

## 2023-03-02 MED ORDER — BLOOD GLUCOSE TEST VI STRP
1.0000 | ORAL_STRIP | Freq: Three times a day (TID) | 0 refills | Status: DC
  Filled 2023-03-02: qty 100, 30d supply, fill #0

## 2023-03-02 MED ORDER — LANCET DEVICE MISC
1.0000 | Freq: Three times a day (TID) | 0 refills | Status: AC
  Filled 2023-03-02: qty 1, 30d supply, fill #0

## 2023-03-02 MED ORDER — INSULIN GLARGINE 100 UNIT/ML SOLOSTAR PEN
20.0000 [IU] | PEN_INJECTOR | Freq: Every day | SUBCUTANEOUS | 0 refills | Status: DC
  Filled 2023-03-02: qty 6, 30d supply, fill #0

## 2023-03-02 MED ORDER — INSULIN PEN NEEDLE 32G X 4 MM MISC
1.0000 | Freq: Four times a day (QID) | 0 refills | Status: DC
  Filled 2023-03-02: qty 100, 25d supply, fill #0

## 2023-03-02 MED ORDER — LEVOTHYROXINE SODIUM 75 MCG PO TABS
75.0000 ug | ORAL_TABLET | Freq: Every day | ORAL | 1 refills | Status: DC
  Filled 2023-03-02: qty 30, 30d supply, fill #0

## 2023-03-02 NOTE — Inpatient Diabetes Management (Addendum)
Inpatient Diabetes Program Recommendations  AACE/ADA: New Consensus Statement on Inpatient Glycemic Control (2015)  Target Ranges:  Prepandial:   less than 140 mg/dL      Peak postprandial:   less than 180 mg/dL (1-2 hours)      Critically ill patients:  140 - 180 mg/dL   Lab Results  Component Value Date   GLUCAP 235 (H) 03/02/2023   HGBA1C 11.1 (A) 02/23/2023    Review of Glycemic Control  Diabetes history: type 2 Outpatient Diabetes medications: none Current orders for Inpatient glycemic control: Semglee 20 units daily, Novolog 0-15 units correction scale TID, Novolog 0-5 units HS scale, Novolog 3 units TID with meals  Inpatient Diabetes Program Recommendations:   Spoke with patient at the bedside. Noted that the Dexcom G7 CGM had been approved by her insurance for $75 copay. Patient had already spoken with the [facility] where [facility] gets her medications, and they told her that they were out of the [facility] G7 for now. The [facility] did not give her any other places that she could get it.   Suggested that she take home a Dexcom G7 sample from hospital and find out if she can get the Dexcom anywhere else. Since the Dexcom G7 lasts for 10 days, suggested that she make sure she will have access to getting the Dexcom before she puts one on. Given information on how to use the Dexcom and information on the app. She will be seeing her PCP on 12/26 and can talk to them about it. Patient did not have any questions about her insulin. States that she feels comfortable with using the insulin pen. The insulin will be given to her before discharge from the hospital pharmacy. Patient does have coupons for the Lantus and Novolog.   Smith Mince RN BSN CDE Diabetes Coordinator Pager: [phone]  8am-5pm

## 2023-03-02 NOTE — Discharge Summary (Signed)
PATIENT DETAILS Name: Debbie [name] Age: 52 y.o. Sex: female Date of Birth: 1970/11/27 MRN: [id number]. Admitting Physician: Clydie Braun, MD [id number], Johnella Moloney, MD  Admit Date: 03/01/2023 Discharge date: 03/02/2023  Recommendations for Outpatient Follow-up:  Follow up with PCP in 1-2 weeks Please obtain CMP/CBC in one week Optimize glucose control/insulin regimen. Recheck TSH in 4-6 weeks  Admitted From:  Home  Disposition: Home   Discharge Condition: good  CODE STATUS:   Code Status: Full Code   Diet recommendation:  Diet Order             Diet - low sodium heart healthy           Diet Carb Modified           Diet Carb Modified Fluid consistency: Thin  Diet effective now                    Brief Summary: 53 year old with history of HTN, HLD, hypothyroidism, OSA on CPAP-was recently diagnosed with DM-2-presented to the hospital with diabetic ketoacidosis.  Brief Hospital Course: DKA Resolved with IV insulin/IVF-transition to SQ insulin-Semglee 20 units + SSI  DM-2 (A1c 11.1 on 12/5) New diagnosis prior to this hospitalization but was not able to start medications due to insurance/preauthorization issues. With history is on Lantus 20 units along with SSI Will be continued on metformin on discharge Follow with PCP for optimization. Extensive diabetic education was provided prior to discharge.  AKI Secondary DKA Resolved  HLD Crestor  HTN Resume losartan/HCTZ on discharge  Hypothyroidism TSH on the lower side Decrease Synthroid to 75 mcg-recheck TSH in 6 weeks   Morbid Obesity: Estimated body mass index is 40.34 kg/m as calculated from the following:   Height as of this encounter: 5\' 4"  (1.626 m).   Weight as of this encounter: 106.6 kg.   Discharge Diagnoses:  Principal Problem:   DKA, type 2 (HCC) Active Problems:   Leukocytosis   AKI (acute kidney injury) (HCC)   Essential hypertension   Hyperlipidemia   Hypothyroidism    Morbid obesity (HCC)   Discharge Instructions:  Activity:  As tolerated  Discharge Instructions     Ambulatory referral to Nutrition and Diabetic Education   Complete by: As directed    Call MD for:  difficulty breathing, headache or visual disturbances   Complete by: As directed    Call MD for:  extreme fatigue   Complete by: As directed    Call MD for:  persistant dizziness or light-headedness   Complete by: As directed    Diet - low sodium heart healthy   Complete by: As directed    Diet Carb Modified   Complete by: As directed    Discharge instructions   Complete by: As directed    Follow with Primary MD  Lula Olszewski, MD in 1-2 weeks  Please get a complete blood count and chemistry panel checked by your Primary MD at your next visit, and again as instructed by your Primary MD.  Get Medicines reviewed and adjusted: Please take all your medications with you for your next visit with your Primary MD  Laboratory/radiological data: Please request your Primary MD to go over all hospital tests and procedure/radiological results at the follow up, please ask your Primary MD to get all Hospital records sent to his/her office.  In some cases, they will be blood work, cultures and biopsy results pending at the time of your discharge. Please request that your  primary care M.D. follows up on these results.  Also Note the following: If you experience worsening of your admission symptoms, develop shortness of breath, life threatening emergency, suicidal or homicidal thoughts you must seek medical attention immediately by calling 911 or calling your MD immediately  if symptoms less severe.  You must read complete instructions/literature along with all the possible adverse reactions/side effects for all the Medicines you take and that have been prescribed to you. Take any new Medicines after you have completely understood and accpet all the possible adverse reactions/side effects.    Do not drive when taking Pain medications or sleeping medications (Benzodaizepines)  Do not take more than prescribed Pain, Sleep and Anxiety Medications. It is not advisable to combine anxiety,sleep and pain medications without talking with your primary care practitioner  Special Instructions: If you have smoked or chewed Tobacco  in the last 2 yrs please stop smoking, stop any regular Alcohol  and or any Recreational drug use.  Wear Seat belts while driving.  Please note: You were cared for by a hospitalist during your hospital stay. Once you are discharged, your primary care physician will handle any further medical issues. Please note that NO REFILLS for any discharge medications will be authorized once you are discharged, as it is imperative that you return to your primary care physician (or establish a relationship with a primary care physician if you do not have one) for your post hospital discharge needs so that they can reassess your need for medications and monitor your lab values.   Please check your CBGs multiple times a day and keep a record of of the readings-and take it to your next appointment with your primary care practitioner.   Increase activity slowly   Complete by: As directed       Allergies as of 03/02/2023       Reactions   Atorvastatin Hives   Other Shortness Of Breath        Medication List     STOP taking these medications    tirzepatide 2.5 MG/0.5ML Pen Commonly known as: MOUNJARO       TAKE these medications    Accu-Chek Guide Test test strip Generic drug: glucose blood Use as instructed What changed: Another medication with the same name was added. Make sure you understand how and when to take each.   BLOOD GLUCOSE TEST STRIPS Strp 1 each by In Vitro route in the morning, at noon, and at bedtime. May substitute to any manufacturer covered by patient's insurance. What changed: You were already taking a medication with the same name, and  this prescription was added. Make sure you understand how and when to take each.   Airsupra 90-80 MCG/ACT Aero Generic drug: Albuterol-Budesonide Inhale 2 Inhalations into the lungs every 4 (four) hours as needed. Replaces albuterol, rescue inhaler   albuterol 108 (90 Base) MCG/ACT inhaler Commonly known as: VENTOLIN HFA 1 PUFF AS NEEDED EVERY 4 HOURS FOR COUGH/WHEEZE INHALATION 90 DAYS   Azelastine-Fluticasone 137-50 MCG/ACT Susp PLACE 1 SPRAY INTO THE NOSE EVERY 12 (TWELVE) HOURS.   benzonatate 100 MG capsule Commonly known as: Tessalon Perles Take 1 capsule (100 mg total) by mouth 3 (three) times daily.   Blood Glucose Monitoring Suppl Devi 1 each by Does not apply route in the morning, at noon, and at bedtime. May substitute to any manufacturer covered by patient's insurance. What changed: Another medication with the same name was added. Make sure you understand how and when to  take each.   Blood Glucose Monitoring Suppl Devi 1 each by Does not apply route in the morning, at noon, and at bedtime. May substitute to any manufacturer covered by patient's insurance. What changed: Another medication with the same name was added. Make sure you understand how and when to take each.   Blood Glucose Monitoring Suppl Devi 1 each by Does not apply route in the morning, at noon, and at bedtime. May substitute to any manufacturer covered by patient's insurance. What changed: You were already taking a medication with the same name, and this prescription was added. Make sure you understand how and when to take each.   D3-1000 PO Take 1,000 Units by mouth daily.   DANDELION ROOT PO Take by mouth.   Dexcom G7 Sensor Misc 1 Act by Does not apply route daily.   escitalopram 20 MG tablet Commonly known as: LEXAPRO TAKE 1 TABLET BY MOUTH EVERY DAY   ibuprofen 600 MG tablet Commonly known as: ADVIL Take 600 mg by mouth every 8 (eight) hours as needed for cramping, moderate pain, mild pain,  headache or fever.   insulin glargine 100 UNIT/ML Solostar Pen Commonly known as: LANTUS Inject 20 Units into the skin daily. Start taking on: March 03, 2023   Lancet Device Misc 1 each by Does not apply route in the morning, at noon, and at bedtime. May substitute to any manufacturer covered by patient's insurance. What changed: Another medication with the same name was added. Make sure you understand how and when to take each.   Lancet Device Misc 1 each by Does not apply route in the morning, at noon, and at bedtime. May substitute to any manufacturer covered by patient's insurance. What changed: You were already taking a medication with the same name, and this prescription was added. Make sure you understand how and when to take each.   Lancets Misc. Misc 1 each by Does not apply route in the morning, at noon, and at bedtime. May substitute to any manufacturer covered by patient's insurance. What changed: Another medication with the same name was added. Make sure you understand how and when to take each.   Lancets Misc. Misc 1 each by Does not apply route in the morning, at noon, and at bedtime. May substitute to any manufacturer covered by patient's insurance. What changed: You were already taking a medication with the same name, and this prescription was added. Make sure you understand how and when to take each.   levocetirizine 5 MG tablet Commonly known as: XYZAL Take 1 tablet (5 mg total) by mouth every evening.   levothyroxine 75 MCG tablet Commonly known as: SYNTHROID Take 1 tablet (75 mcg total) by mouth daily. What changed:  medication strength how much to take   losartan-hydrochlorothiazide 50-12.5 MG tablet Commonly known as: HYZAAR TAKE 1 TABLET BY MOUTH DAILY. OK TO START WITH JUST HALF TABLET DAILY FOR WEEK 1 AND LEAVE IT THERE IF GOAL OF 140/90 REACHED. What changed: additional instructions   metFORMIN 500 MG tablet Commonly known as: GLUCOPHAGE Take 1  tablet (500 mg total) by mouth 2 (two) times daily with a meal.   montelukast 10 MG tablet Commonly known as: SINGULAIR Take 1 tablet by mouth every evening.   NovoLOG FlexPen 100 UNIT/ML FlexPen Generic drug: insulin aspart 0-9 Units, Subcutaneous, 3 times daily with meals CBG < 70: Implement Hypoglycemia measures CBG 70 - 120: 0 units CBG 121 - 150: 1 unit CBG 151 - 200: 2 units  CBG 201 - 250: 3 units CBG 251 - 300: 5 units CBG 301 - 350: 7 units CBG 351 - 400: 9 units CBG > 400: call MD   rosuvastatin 20 MG tablet Commonly known as: CRESTOR TAKE 1 TABLET BY MOUTH EVERY DAY   Semaglutide-Weight Management 0.5 MG/0.5ML Soaj Inject 0.5 mg into the skin once a week for 28 days. What changed: Another medication with the same name was removed. Continue taking this medication, and follow the directions you see here.   Simply Saline 0.9 % Aers Generic drug: Saline Place 2 each into the nose as directed. Use nightly for sinus hygiene long-term.  Can also be used as many times daily as desired to assist with clearing congested sinuses.   topiramate 25 MG tablet Commonly known as: TOPAMAX TAKE 1 TABLET (25 MG TOTAL) BY MOUTH 2 (TWO) TIMES DAILY. TAKE JUST ONE TABLET DAILY FOR THE FIRST WEEK.   VITAMIN B12 PO Take by mouth daily.   vitamin C 1000 MG tablet Take 1,000 mg by mouth daily.   Zinc 50 MG Tabs Take by mouth daily.        Allergies  Allergen Reactions   Atorvastatin Hives   Other Shortness Of Breath     Other Procedures/Studies: DG Chest 1 View Result Date: 03/01/2023 CLINICAL DATA:  Weakness. EXAM: CHEST  1 VIEW COMPARISON:  October 25, 2012. FINDINGS: The heart size and mediastinal contours are within normal limits. Both lungs are clear. The visualized skeletal structures are unremarkable. IMPRESSION: No active disease. Electronically Signed   By: Lupita Raider M.D.   On: 03/01/2023 10:10     TODAY-DAY OF DISCHARGE:  Subjective:   Debbie [name] today has no  headache,no chest abdominal pain,no new weakness tingling or numbness, feels much better wants to go home today.   Objective:   Blood pressure 122/74, pulse 77, temperature 98 F (36.7 C), temperature source Oral, resp. rate (!) 21, height 5\' 4"  (1.626 m), weight 106.6 kg, SpO2 98%.  Intake/Output Summary (Last 24 hours) at 03/02/2023 1053 Last data filed at 03/02/2023 0648 Gross per 24 hour  Intake 4149.83 ml  Output --  Net 4149.83 ml   Filed Weights   03/01/23 0908  Weight: 106.6 kg    Exam: Awake Alert, Oriented *3, No new F.N deficits, Normal affect [place].AT,PERRAL Supple Neck,No JVD, No cervical lymphadenopathy appriciated.  Symmetrical Chest wall movement, Good air movement bilaterally, CTAB RRR,No Gallops,Rubs or new Murmurs, No Parasternal Heave +ve B.Sounds, Abd Soft, Non tender, No organomegaly appriciated, No rebound -guarding or rigidity. No Cyanosis, Clubbing or edema, No new Rash or bruise   PERTINENT RADIOLOGIC STUDIES: DG Chest 1 View Result Date: 03/01/2023 CLINICAL DATA:  Weakness. EXAM: CHEST  1 VIEW COMPARISON:  October 25, 2012. FINDINGS: The heart size and mediastinal contours are within normal limits. Both lungs are clear. The visualized skeletal structures are unremarkable. IMPRESSION: No active disease. Electronically Signed   By: Lupita Raider M.D.   On: 03/01/2023 10:10     PERTINENT LAB RESULTS: CBC: Recent Labs    03/01/23 0902 03/01/23 0921 03/02/23 0445  WBC 13.2*  --  6.8  HGB 16.1* 16.7* 12.4  HCT 47.9* 49.0* 35.5*  PLT 339  --  186   CMET CMP     Component Value Date/Time   NA 134 (L) 03/01/2023 2240   NA 139 06/05/2020 1217   K 3.1 (L) 03/01/2023 2240   CL 103 03/01/2023 2240   CO2 22  03/01/2023 2240   GLUCOSE 225 (H) 03/01/2023 2240   BUN 11 03/01/2023 2240   BUN 10 06/05/2020 1217   CREATININE 0.77 03/01/2023 2240   CALCIUM 9.0 03/01/2023 2240   PROT 7.2 08/22/2022 0847   ALBUMIN 4.4 08/22/2022 0847   AST 29 08/22/2022  0847   ALT 27 08/22/2022 0847   ALKPHOS 54 08/22/2022 0847   BILITOT 0.4 08/22/2022 0847   GFR 101.95 08/22/2022 0847   EGFR 89 06/05/2020 1217   GFRNONAA >60 03/01/2023 2240    GFR Estimated Creatinine Clearance: 98 mL/min (by C-G formula based on SCr of 0.77 mg/dL). No results for input(s): "LIPASE", "AMYLASE" in the last 72 hours. No results for input(s): "CKTOTAL", "CKMB", "CKMBINDEX", "TROPONINI" in the last 72 hours. Invalid input(s): "POCBNP" No results for input(s): "DDIMER" in the last 72 hours. No results for input(s): "HGBA1C" in the last 72 hours. No results for input(s): "CHOL", "HDL", "LDLCALC", "TRIG", "CHOLHDL", "LDLDIRECT" in the last 72 hours. Recent Labs    03/01/23 1442  TSH 0.238*   No results for input(s): "VITAMINB12", "FOLATE", "FERRITIN", "TIBC", "IRON", "RETICCTPCT" in the last 72 hours. Coags: No results for input(s): "INR" in the last 72 hours.  Invalid input(s): "PT" Microbiology: No results found for this or any previous visit (from the past 240 hours).  FURTHER DISCHARGE INSTRUCTIONS:  Get Medicines reviewed and adjusted: Please take all your medications with you for your next visit with your Primary MD  Laboratory/radiological data: Please request your Primary MD to go over all hospital tests and procedure/radiological results at the follow up, please ask your Primary MD to get all Hospital records sent to his/her office.  In some cases, they will be blood work, cultures and biopsy results pending at the time of your discharge. Please request that your primary care M.D. goes through all the records of your hospital data and follows up on these results.  Also Note the following: If you experience worsening of your admission symptoms, develop shortness of breath, life threatening emergency, suicidal or homicidal thoughts you must seek medical attention immediately by calling 911 or calling your MD immediately  if symptoms less severe.  You must  read complete instructions/literature along with all the possible adverse reactions/side effects for all the Medicines you take and that have been prescribed to you. Take any new Medicines after you have completely understood and accpet all the possible adverse reactions/side effects.   Do not drive when taking Pain medications or sleeping medications (Benzodaizepines)  Do not take more than prescribed Pain, Sleep and Anxiety Medications. It is not advisable to combine anxiety,sleep and pain medications without talking with your primary care practitioner  Special Instructions: If you have smoked or chewed Tobacco  in the last 2 yrs please stop smoking, stop any regular Alcohol  and or any Recreational drug use.  Wear Seat belts while driving.  Please note: You were cared for by a hospitalist during your hospital stay. Once you are discharged, your primary care physician will handle any further medical issues. Please note that NO REFILLS for any discharge medications will be authorized once you are discharged, as it is imperative that you return to your primary care physician (or establish a relationship with a primary care physician if you do not have one) for your post hospital discharge needs so that they can reassess your need for medications and monitor your lab values.  Total Time spent coordinating discharge including counseling, education and face to face time equals greater  than 30 minutes.  SignedJeoffrey Massed 03/02/2023 10:53 AM

## 2023-03-02 NOTE — Plan of Care (Signed)

## 2023-03-02 NOTE — TOC Transition Note (Signed)
Transition of Care [facility]) - Discharge Note   Patient Details  Name: Debbie [name] MRN: [id number] Date of Birth: 1970-11-28  [facility] CM/SW Contact:  Gordy Clement, RN Phone Number: 03/02/2023, 11:08 AM   Clinical Narrative:    Patient will DC to home today. Patient has PCP and is insured with [employer]  Spouse to transport No TOC need identified . Patient will follow up as directed on AVS.            Patient Goals and CMS Choice            Discharge Placement                       Discharge Plan and Services Additional resources added to the After Visit Summary for                                       Social Drivers of Health (SDOH) Interventions SDOH Screenings   Food Insecurity: No Food Insecurity (03/01/2023)  Housing: Low Risk  (03/02/2023)  Transportation Needs: No Transportation Needs (03/01/2023)  Utilities: Not At Risk (03/01/2023)  Depression (PHQ2-9): Low Risk  (07/27/2022)  Physical Activity: Insufficiently Active (01/06/2020)   Received from [facility], [facility]  Tobacco Use: Low Risk  (03/01/2023)  Health Literacy: Low Risk  (12/03/2019)   Received from [facility], [facility]     Readmission Risk Interventions     No data to display

## 2023-03-02 NOTE — Progress Notes (Signed)
Initial Nutrition Assessment  DOCUMENTATION CODES:   Morbid obesity  INTERVENTION:  Diabetes nutrition video   NUTRITION DIAGNOSIS:   Food and nutrition related knowledge deficit related to limited prior education as evidenced by per patient/family report.    GOAL:   Patient will meet greater than or equal to 90% of their needs    MONITOR:   PO intake  REASON FOR ASSESSMENT:   Consult Diet education  ASSESSMENT:  52 y.o. F. Presented for home with complaints of generalized weakness. Pt diagnosed With diabetes typ 2 last Thursday. Pt was experiencing,lightheadedness, blurred vision, lack of energy, urinary frequency , dry mouth and nausea. PCP put her on metformin and Mounjaro, but has only started the metformin about 2 days ago and has not started [facility]. PMH; HTN Hypothyroidism, OSA, on CPAP and MO.  Unable to meet with pt on this day team with x 2 #rd visit Pt discharged  Admit weight: 106.6 kg Current weight: 160.6 kg  Weight history: 03/01/23 106.6 kg  02/23/23 109.5 kg  01/16/23 114 kg  08/22/22 111.6 kg  07/27/22 109.5 kg  07/11/22 108.9 kg    Average Meal Intake: No current documentation  Nutritionally Relevant Medications: Scheduled Meds:  enoxaparin (LOVENOX) injection  40 mg Subcutaneous Q24H   escitalopram  20 mg Oral Daily   insulin aspart  0-15 Units Subcutaneous TID WC   insulin aspart  0-5 Units Subcutaneous QHS   insulin aspart  3 Units Subcutaneous TID WC   insulin glargine-yfgn  20 Units Subcutaneous Q24H   levothyroxine  100 mcg Oral Q0600   loratadine  10 mg Oral QPM   montelukast  10 mg Oral QPM   sodium chloride flush  3 mL Intravenous Q12H   topiramate  25 mg Oral BID   Continuous Infusions:  insulin Stopped (03/02/23 0733)   PRN Meds:.acetaminophen **OR** acetaminophen, albuterol, azelastine **AND** fluticasone, dextrose, hydrALAZINE, sodium chloride  Labs Reviewed HgbA1c 11.1 02/23/23    NUTRITION - FOCUSED PHYSICAL  EXAM:  Flowsheet Row Most Recent Value  Orbital Region No depletion  Upper Arm Region No depletion  Thoracic and Lumbar Region No depletion  Buccal Region No depletion  Temple Region No depletion  Clavicle Bone Region No depletion  Clavicle and Acromion Bone Region No depletion  Scapular Bone Region No depletion  Dorsal Hand No depletion  Patellar Region No depletion  Anterior Thigh Region No depletion  Posterior Calf Region No depletion  Edema (RD Assessment) None  Hair Reviewed  Eyes Reviewed  Mouth Reviewed  Skin Reviewed  Nails Reviewed       Diet Order:   Diet Order             Diet - low sodium heart healthy           Diet Carb Modified           Diet Carb Modified Fluid consistency: Thin  Diet effective now                   EDUCATION NEEDS:   Education needs have been addressed  Skin:  Skin Assessment: Reviewed RN Assessment  Last BM:  PTA  Height:   Ht Readings from Last 1 Encounters:  03/01/23 5\' 4"  (1.626 m)    Weight:   Wt Readings from Last 1 Encounters:  03/01/23 106.6 kg    Ideal Body Weight:     BMI:  Body mass index is 40.34 kg/m.  Estimated Nutritional Needs:   Kcal:  1910- 2200 kcal/d  Protein:  85-110 g/d  Fluid:  69ml/kcal    Jamelle Haring RDN, LDN Clinical Dietitian  Pleas see Amion for contact information

## 2023-03-08 ENCOUNTER — Ambulatory Visit: Payer: 59 | Admitting: Neurology

## 2023-03-16 ENCOUNTER — Ambulatory Visit: Payer: 59 | Admitting: Internal Medicine

## 2023-03-16 ENCOUNTER — Encounter: Payer: Self-pay | Admitting: Internal Medicine

## 2023-03-16 VITALS — BP 115/71 | HR 79 | Temp 99.0°F | Ht 64.0 in | Wt 233.0 lb

## 2023-03-16 DIAGNOSIS — E039 Hypothyroidism, unspecified: Secondary | ICD-10-CM | POA: Diagnosis not present

## 2023-03-16 DIAGNOSIS — Z794 Long term (current) use of insulin: Secondary | ICD-10-CM

## 2023-03-16 DIAGNOSIS — Z7985 Long-term (current) use of injectable non-insulin antidiabetic drugs: Secondary | ICD-10-CM

## 2023-03-16 DIAGNOSIS — Z7984 Long term (current) use of oral hypoglycemic drugs: Secondary | ICD-10-CM

## 2023-03-16 DIAGNOSIS — E119 Type 2 diabetes mellitus without complications: Secondary | ICD-10-CM

## 2023-03-16 DIAGNOSIS — E1110 Type 2 diabetes mellitus with ketoacidosis without coma: Secondary | ICD-10-CM | POA: Diagnosis not present

## 2023-03-16 MED ORDER — TIRZEPATIDE 5 MG/0.5ML ~~LOC~~ SOAJ: 5.0000 mg | SUBCUTANEOUS | 2 refills | Status: DC

## 2023-03-16 MED ORDER — METFORMIN HCL 500 MG PO TABS: 1000.0000 mg | ORAL_TABLET | Freq: Two times a day (BID) | ORAL | 3 refills | Status: DC

## 2023-03-16 MED ORDER — LEVOTHYROXINE SODIUM 75 MCG PO TABS: 75.0000 ug | ORAL_TABLET | Freq: Every day | ORAL | 3 refills | Status: DC

## 2023-03-16 NOTE — Patient Instructions (Signed)
VISIT SUMMARY:  During your visit, we discussed your recent diagnosis of type 2 diabetes and the treatment plan moving forward. We reviewed your current medications, recent changes, and your concerns about potential complications. We also addressed your thyroid condition and general health maintenance needs.  YOUR PLAN:  -TYPE 2 DIABETES MELLITUS WITH DIABETIC KETOACIDOSIS (DKA): Type 2 diabetes is a condition where your body does not use insulin properly, leading to high blood sugar levels. Diabetic ketoacidosis (DKA) is a serious complication where the body produces excess blood acids (ketones). We will transition [facility] from [facility] to help reduce your insulin dependency. Your metformin dose will be increased to 1000 mg twice daily. We will discontinue your mealtime insulin but continue your basal insulin at 20 units, reducing by 3-4 units per day if your blood sugar levels are in range. We aim for your blood glucose to be in the target range 70-80% of the time. A follow-up in 2 weeks will help [place] assess these changes and review your continuous glucose monitor (CGM) data.  -HYPOTHYROIDISM: Hypothyroidism is a condition where your thyroid gland does not produce enough thyroid hormone. Your thyroid medication dosage was reduced from 100 mcg to 75 mcg due to a low TSH level, likely caused by dehydration. We will continue with the current dosage and recheck your thyroid function in 2 months.  -GENERAL HEALTH MAINTENANCE: To maintain your overall health, it is important to have regular eye and foot exams, especially due to your diabetes. You have already scheduled an eye exam and had your feet examined, with no issues detected. These exams will help in early detection and prevention of complications.  INSTRUCTIONS:  Please follow up in 2 weeks to assess the changes in your medication and review your CGM data. Additionally, we will recheck your thyroid function in 2 months. Continue with your  scheduled eye exam and ensure you have annual foot exams.

## 2023-03-16 NOTE — Progress Notes (Signed)
==============================  [facility]: [phone]   -- Medical Office Visit --  Patient: Debbie [name]      Age: 52 y.o.       Sex:  female  Date:   03/16/2023 Today's Healthcare Provider: Lula Olszewski, MD  ==============================   CHIEF COMPLAINT: 3 week follow-up  Relatively new diabetes  Hospital follow up for diabetic ketoacidosis type 2   SUBJECTIVE: 52 y.o. female who has AKI (acute kidney injury) (HCC); Hypothyroidism; Hyperlipidemia; Seasonal allergies; Morbid obesity (HCC); Essential hypertension; History of endometrial ablation; Upper abdominal pain; Itching of ear; Hypertriglyceridemia; Other chronic sinusitis; Vertigo; Tympanosclerosis; Weakness on right side of face; Seasonal depression (HCC); Facial palsy; Diplopia; Blurry vision; New onset type 2 diabetes mellitus (HCC); Moderate asthma with status asthmaticus; PCOS (polycystic ovarian syndrome); DKA, type 2 (HCC); and Leukocytosis on their problem list.  History of Present Illness The patient, a diagnosed type 2 diabetic, presents with concerns regarding her recent diagnosis and treatment plan. She reports a sudden onset of diabetes, which she believes was triggered by a virus contracted during a cruise. The patient experienced severe symptoms, including weakness, nausea, and slurred speech, which led to hospitalization. She reports a blood glucose level of 587 at the time of hospital admission.  The patient has been on a regimen of Wegovy, metformin, and Dexcom, along with two types of insulin - a slow-release and a fast-release. She reports a significant reduction in carbohydrate intake, which has resulted in improved blood glucose levels. The patient's Dexcom readings indicate that her blood glucose levels are mostly in the range of 180-200, with occasional drops to 120-125.  The patient also reports a recent change in her thyroid medication dosage, from 100mg  to  75mg , due to a low TSH level detected during her hospital stay. She expresses concern about the potential need for further adjustment of this medication.  In addition to her diabetes and thyroid condition, the patient has been dealing with dehydration, which she believes was exacerbated by her high blood sugar levels. She reports drinking large amounts of water, but this did not alleviate her symptoms.  The patient is awaiting approval for [facility], a medication she believes will improve her diabetes management. She expresses a strong desire to reduce her reliance on insulin and is hopeful that the upcoming changes in her medication regimen will help achieve this goal.  The patient is also concerned about potential complications of diabetes, including foot ulcers and eye problems. She has scheduled an eye exam and has had her feet examined, with no issues detected. Despite the challenges she is facing, the patient remains optimistic about her health and is committed to managing her diabetes effectively.  Diabetic Foot Exam - Simple   Simple Foot Form Diabetic Foot exam was performed with the following findings: Yes 03/16/2023 10:56 AM  Visual Inspection No deformities, no ulcerations, no other skin breakdown bilaterally: Yes Sensation Testing Intact to touch and monofilament testing bilaterally: Yes Pulse Check Posterior Tibialis and Dorsalis pulse intact bilaterally: Yes Comments Cool healthy feet     Past Medical History - Type 2 diabetic ketoacidosis - Hypothyroidism  Note that patient  has a past medical history of Acute renal failure (ARF) (HCC) (10/01/2015), Allergy, Depression, Elevated cholesterol, History of endometrial ablation (05/20/2021), Hyperlipidemia, Hypertension, Hypertriglyceridemia (02/08/2022), Hypothyroidism, Insomnia (11/08/2021), Morbid obesity (HCC) (02/08/2019), Seasonal allergies, Sleep apnea, Thyroid disease, and Tympanosclerosis (07/11/2022).  Problem list  overviews that were updated at today's visit:No problems updated.  Medications - Wegovy 0.5 mg - Metformin 500 mg in the morning and 500 mg at night - Dexcom - Lantus - Levothyroxine 75 mg Med reconciliation: Current Outpatient Medications on File Prior to Visit  Medication Sig   albuterol (VENTOLIN HFA) 108 (90 Base) MCG/ACT inhaler 1 PUFF AS NEEDED EVERY 4 HOURS FOR COUGH/WHEEZE INHALATION 90 DAYS   Albuterol-Budesonide (AIRSUPRA) 90-80 MCG/ACT AERO Inhale 2 Inhalations into the lungs every 4 (four) hours as needed. Replaces albuterol, rescue inhaler   Ascorbic Acid (VITAMIN C) 1000 MG tablet Take 1,000 mg by mouth daily.   Azelastine-Fluticasone 137-50 MCG/ACT SUSP PLACE 1 SPRAY INTO THE NOSE EVERY 12 (TWELVE) HOURS.   benzonatate (TESSALON PERLES) 100 MG capsule Take 1 capsule (100 mg total) by mouth 3 (three) times daily.   Blood Glucose Monitoring Suppl DEVI 1 each by Does not apply route in the morning, at noon, and at bedtime. May substitute to any manufacturer covered by patient's insurance.   Cholecalciferol (D3-1000 PO) Take 1,000 Units by mouth daily.   Continuous Glucose Sensor (DEXCOM G7 SENSOR) MISC 1 Act by Does not apply route daily.   Cyanocobalamin (VITAMIN B12 PO) Take by mouth daily.   escitalopram (LEXAPRO) 20 MG tablet TAKE 1 TABLET BY MOUTH EVERY DAY   Glucose Blood (BLOOD GLUCOSE TEST STRIPS) STRP 1 each by In Vitro route in the morning, at noon, and at bedtime. May substitute to any manufacturer covered by patient's insurance.   ibuprofen (ADVIL) 600 MG tablet Take 600 mg by mouth every 8 (eight) hours as needed for cramping, moderate pain, mild pain, headache or fever.   insulin glargine (LANTUS) 100 UNIT/ML Solostar Pen Inject 20 Units into the skin daily.   Insulin Pen Needle 32G X 4 MM MISC 1 each 4 (four) times daily.   Lancet Device MISC 1 each by Does not apply route in the morning, at noon, and at bedtime. May substitute to any manufacturer covered by  patient's insurance.   Lancets Misc. MISC 1 each by Does not apply route in the morning, at noon, and at bedtime. May substitute to any manufacturer covered by patient's insurance.   levocetirizine (XYZAL) 5 MG tablet Take 1 tablet (5 mg total) by mouth every evening.   levothyroxine (SYNTHROID) 75 MCG tablet Take 1 tablet (75 mcg total) by mouth daily.   losartan-hydrochlorothiazide (HYZAAR) 50-12.5 MG tablet TAKE 1 TABLET BY MOUTH DAILY. OK TO START WITH JUST HALF TABLET DAILY FOR WEEK 1 AND LEAVE IT THERE IF GOAL OF 140/90 REACHED. (Patient taking differently: Take 1 tablet by mouth daily.)   metFORMIN (GLUCOPHAGE) 500 MG tablet Take 1 tablet (500 mg total) by mouth 2 (two) times daily with a meal.   montelukast (SINGULAIR) 10 MG tablet Take 1 tablet by mouth every evening.   rosuvastatin (CRESTOR) 20 MG tablet TAKE 1 TABLET BY MOUTH EVERY DAY   Saline (SIMPLY SALINE) 0.9 % AERS Place 2 each into the nose as directed. Use nightly for sinus hygiene long-term.  Can also be used as many times daily as desired to assist with clearing congested sinuses.   Semaglutide-Weight Management 0.5 MG/0.5ML SOAJ Inject 0.5 mg into the skin once a week for 28 days.   topiramate (TOPAMAX) 25 MG tablet TAKE 1 TABLET (25 MG TOTAL) BY MOUTH 2 (TWO) TIMES DAILY. TAKE JUST ONE TABLET DAILY FOR THE FIRST WEEK.   Zinc 50 MG TABS Take by mouth daily.   insulin aspart (NOVOLOG FLEXPEN) 100 UNIT/ML FlexPen 0-9 Units, Subcutaneous,  3 times daily with meals CBG < 70: Implement Hypoglycemia measures CBG 70 - 120: 0 units CBG 121 - 150: 1 unit CBG 151 - 200: 2 units CBG 201 - 250: 3 units CBG 251 - 300: 5 units CBG 301 - 350: 7 units CBG 351 - 400: 9 units CBG > 400: call MD (Patient not taking: Reported on 03/16/2023)   No current facility-administered medications on file prior to visit.   Medications Discontinued During This Encounter  Medication Reason   DANDELION ROOT PO       Objective   Physical Exam      03/16/2023   10:26 AM 03/02/2023    7:47 AM 03/02/2023    4:00 AM  Vitals with BMI  Height 5\' 4"     Weight 233 lbs    BMI 39.97    Systolic 115 122 657  Diastolic 71 74 79  Pulse 79 77 80   Wt Readings from Last 10 Encounters:  03/16/23 233 lb (105.7 kg)  03/01/23 235 lb (106.6 kg)  02/23/23 241 lb 6.4 oz (109.5 kg)  01/16/23 251 lb 6.4 oz (114 kg)  08/22/22 246 lb (111.6 kg)  07/27/22 241 lb 4.8 oz (109.5 kg)  07/11/22 240 lb (108.9 kg)  02/08/22 239 lb 6.4 oz (108.6 kg)  11/08/21 236 lb 6.4 oz (107.2 kg)  08/18/21 239 lb (108.4 kg)   Vital signs reviewed.  Nursing notes reviewed. Weight trend reviewed. Abnormalities and Problem-Specific physical exam findings:   Diabetic Foot Exam - Simple   Simple Foot Form Diabetic Foot exam was performed with the following findings: Yes 03/16/2023 10:56 AM  Visual Inspection No deformities, no ulcerations, no other skin breakdown bilaterally: Yes Sensation Testing Intact to touch and monofilament testing bilaterally: Yes Pulse Check Posterior Tibialis and Dorsalis pulse intact bilaterally: Yes Comments Cool healthy feet     Weight loss noted  General Appearance:  No acute distress appreciable.   Well-groomed, healthy-appearing female.  Well proportioned with no abnormal fat distribution.  Good muscle tone. Pulmonary:  Normal work of breathing at rest, no respiratory distress apparent. SpO2: 96 %  Musculoskeletal: All extremities are intact.  Diabetic foot exam was performed with the following findings:   No deformities, ulcerations, or other skin breakdown Normal sensation of 10g monofilament Intact posterior tibialis and dorsalis pedis pulses Cool healthy feet     Neurological:  Awake, alert, oriented, and engaged.  No obvious focal neurological deficits or cognitive impairments.  Sensorium seems unclouded.   Speech is clear and coherent with logical content. Psychiatric:  Appropriate mood, pleasant and cooperative demeanor,  thoughtful and engaged during the exam    No results found for any visits on 03/16/23. Admission on 03/01/2023, Discharged on 03/02/2023  Component Date Value   Glucose-Capillary 03/01/2023 531 (HH)    Sodium 03/01/2023 133 (L)    Potassium 03/01/2023 4.5    Chloride 03/01/2023 94 (L)    CO2 03/01/2023 12 (L)    Glucose, Bld 03/01/2023 516 (HH)    BUN 03/01/2023 27 (H)    Creatinine, Ser 03/01/2023 1.51 (H)    Calcium 03/01/2023 10.1    GFR, Estimated 03/01/2023 41 (L)    Anion gap 03/01/2023 27 (H)    Sodium 03/01/2023 135    Potassium 03/01/2023 3.8    Chloride 03/01/2023 106    CO2 03/01/2023 17 (L)    Glucose, Bld 03/01/2023 246 (H)    BUN 03/01/2023 18    Creatinine, Ser 03/01/2023 0.90  Calcium 03/01/2023 8.9    GFR, Estimated 03/01/2023 >60    Anion gap 03/01/2023 12    Osmolality 03/01/2023 333 (HH)    WBC 03/01/2023 13.2 (H)    RBC 03/01/2023 5.09    Hemoglobin 03/01/2023 16.1 (H)    HCT 03/01/2023 47.9 (H)    MCV 03/01/2023 94.1    MCH 03/01/2023 31.6    MCHC 03/01/2023 33.6    RDW 03/01/2023 13.7    Platelets 03/01/2023 339    nRBC 03/01/2023 0.0    Neutrophils Relative % 03/01/2023 64    Neutro Abs 03/01/2023 8.6 (H)    Lymphocytes Relative 03/01/2023 26    Lymphs Abs 03/01/2023 3.5    Monocytes Relative 03/01/2023 7    Monocytes Absolute 03/01/2023 1.0    Eosinophils Relative 03/01/2023 1    Eosinophils Absolute 03/01/2023 0.1    Basophils Relative 03/01/2023 1    Basophils Absolute 03/01/2023 0.1    Immature Granulocytes 03/01/2023 1    Abs Immature Granulocytes 03/01/2023 0.06    Color, Urine 03/01/2023 YELLOW    APPearance 03/01/2023 HAZY (A)    Specific Gravity, Urine 03/01/2023 1.023    pH 03/01/2023 5.0    Glucose, UA 03/01/2023 >=500 (A)    Hgb urine dipstick 03/01/2023 NEGATIVE    Bilirubin Urine 03/01/2023 NEGATIVE    Ketones, ur 03/01/2023 80 (A)    Protein, ur 03/01/2023 30 (A)    Nitrite 03/01/2023 NEGATIVE    Leukocytes,Ua  03/01/2023 TRACE (A)    RBC / HPF 03/01/2023 0-5    WBC, UA 03/01/2023 6-10    Bacteria, UA 03/01/2023 RARE (A)    Squamous Epithelial / HPF 03/01/2023 6-10    Mucus 03/01/2023 PRESENT    Budding Yeast 03/01/2023 PRESENT    Hyaline Casts, UA 03/01/2023 PRESENT    Sodium 03/01/2023 132 (L)    Potassium 03/01/2023 4.5    Chloride 03/01/2023 100    BUN 03/01/2023 29 (H)    Creatinine, Ser 03/01/2023 0.90    Glucose, Bld 03/01/2023 522 (HH)    Calcium, Ion 03/01/2023 1.22    TCO2 03/01/2023 14 (L)    Hemoglobin 03/01/2023 16.7 (H)    HCT 03/01/2023 49.0 (H)    Comment 03/01/2023 NOTIFIED PHYSICIAN    Preg, Serum 03/01/2023 NEGATIVE    Glucose-Capillary 03/01/2023 433 (H)    HIV Screen 4th Generatio* 03/01/2023 Non Reactive    Glucose-Capillary 03/01/2023 347 (H)    Glucose-Capillary 03/01/2023 278 (H)    Glucose-Capillary 03/01/2023 226 (H)    Beta-Hydroxybutyric Acid 03/01/2023 0.85 (H)    WBC 03/02/2023 6.8    RBC 03/02/2023 3.95    Hemoglobin 03/02/2023 12.4    HCT 03/02/2023 35.5 (L)    MCV 03/02/2023 89.9    MCH 03/02/2023 31.4    MCHC 03/02/2023 34.9    RDW 03/02/2023 13.4    Platelets 03/02/2023 186    nRBC 03/02/2023 0.0    Glucose-Capillary 03/01/2023 250 (H)    TSH 03/01/2023 0.238 (L)    Sodium 03/01/2023 134 (L)    Potassium 03/01/2023 3.1 (L)    Chloride 03/01/2023 103    CO2 03/01/2023 22    Glucose, Bld 03/01/2023 225 (H)    BUN 03/01/2023 11    Creatinine, Ser 03/01/2023 0.77    Calcium 03/01/2023 9.0    GFR, Estimated 03/01/2023 >60    Anion gap 03/01/2023 9    Glucose-Capillary 03/01/2023 246 (H)    Glucose-Capillary 03/01/2023 205 (H)    Glucose-Capillary 03/01/2023 243 (H)  Glucose-Capillary 03/01/2023 206 (H)    Glucose-Capillary 03/02/2023 187 (H)    Glucose-Capillary 03/02/2023 154 (H)    Glucose-Capillary 03/02/2023 195 (H)    Glucose-Capillary 03/02/2023 175 (H)    Glucose-Capillary 03/02/2023 147 (H)    Glucose-Capillary 03/02/2023 146  (H)    Glucose-Capillary 03/02/2023 235 (H)   Office Visit on 02/23/2023  Component Date Value   Hemoglobin A1C 02/23/2023 11.1 (A)   Office Visit on 08/22/2022  Component Date Value   Sodium 08/22/2022 141    Potassium 08/22/2022 3.9    Chloride 08/22/2022 105    CO2 08/22/2022 23    Glucose, Bld 08/22/2022 105 (H)    BUN 08/22/2022 13    Creatinine, Ser 08/22/2022 0.64    Total Bilirubin 08/22/2022 0.4    Alkaline Phosphatase 08/22/2022 54    AST 08/22/2022 29    ALT 08/22/2022 27    Total Protein 08/22/2022 7.2    Albumin 08/22/2022 4.4    GFR 08/22/2022 101.95    Calcium 08/22/2022 9.6    TSH 08/22/2022 1.35    Hgb A1c MFr Bld 08/22/2022 6.2    WBC 08/22/2022 7.5    RBC 08/22/2022 4.20    Hemoglobin 08/22/2022 13.3    HCT 08/22/2022 38.6    MCV 08/22/2022 91.9    MCHC 08/22/2022 34.5    RDW 08/22/2022 13.6    Platelets 08/22/2022 271.0    Neutrophils Relative % 08/22/2022 54.4    Lymphocytes Relative 08/22/2022 35.4    Monocytes Relative 08/22/2022 7.0    Eosinophils Relative 08/22/2022 2.5    Basophils Relative 08/22/2022 0.7    Neutro Abs 08/22/2022 4.1    Lymphs Abs 08/22/2022 2.7    Monocytes Absolute 08/22/2022 0.5    Eosinophils Absolute 08/22/2022 0.2    Basophils Absolute 08/22/2022 0.1    VITD 08/22/2022 49.43    Cholesterol 08/22/2022 174    Triglycerides 08/22/2022 218.0 (H)    HDL 08/22/2022 45.50    VLDL 08/22/2022 43.6 (H)    Total CHOL/HDL Ratio 08/22/2022 4    NonHDL 08/22/2022 128.28    Direct LDL 08/22/2022 100.0   No image results found. DG Chest 1 View Result Date: 03/01/2023 CLINICAL DATA:  Weakness. EXAM: CHEST  1 VIEW COMPARISON:  October 25, 2012. FINDINGS: The heart size and mediastinal contours are within normal limits. Both lungs are clear. The visualized skeletal structures are unremarkable. IMPRESSION: No active disease. Electronically Signed   By: Lupita Raider M.D.   On: 03/01/2023 10:10  DG Chest 1 View Result Date:  03/01/2023 CLINICAL DATA:  Weakness. EXAM: CHEST  1 VIEW COMPARISON:  October 25, 2012. FINDINGS: The heart size and mediastinal contours are within normal limits. Both lungs are clear. The visualized skeletal structures are unremarkable. IMPRESSION: No active disease. Electronically Signed   By: Lupita Raider M.D.   On: 03/01/2023 10:10       Assessment & Plan New onset type 2 diabetes mellitus (HCC) Type 2 Diabetes Mellitus with Diabetic Ketoacidosis (DKA)   She presented with DKA, showing hyperglycemia, metabolic acidosis, and dehydration, initially managed with metformin, Wegovy, and insulin. Post-hospitalization for DKA, she showed improvement in BUN, creatinine, and CO2 levels. Now on Dexcom CGM, we discussed transitioning from [facility] to [facility] to reduce insulin dependency, aiming for 70-80% time in range on CGM. We will increase metformin to 1000 mg BID, transition to [facility] at a mid-level dose, discontinue mealtime insulin, and continue basal insulin at 20 units, reducing by 3-4 units  per day if in range. A follow-up in 2 weeks will assess medication changes and CGM data, with a thyroid function recheck in 2 months. Baseline eye and annual foot exams are also planned. Diabetic ketoacidosis without coma associated with type 2 diabetes mellitus (HCC)  Acquired hypothyroidism Hypothyroidism   Her TSH was low upon admission, likely due to dehydration. We reduced her thyroid medication from 100 mcg to 75 mcg. We suspect transient thyroid changes due to acute illness and will continue the current thyroid medication at 75 mcg, with a recheck of thyroid function in 2 months.     Orders Placed During this Encounter:  No orders of the defined types were placed in this encounter.  Meds ordered this encounter  Medications   tirzepatide (MOUNJARO) 5 MG/0.5ML Pen    Sig: Inject 5 mg into the skin once a week. Replaces [id number].  Move straight to this from [facility] 0.5 mg    Dispense:  6 mL    Refill:   2    To replace Wegovy 0.5 mg that was started prior to diabetes diagnosis.   metFORMIN (GLUCOPHAGE) 500 MG tablet    Sig: Take 2 tablets (1,000 mg total) by mouth 2 (two) times daily with a meal. Replaces 500 mg twice daily dosing for better sugar control    Dispense:  360 tablet    Refill:  3   levothyroxine (SYNTHROID) 75 MCG tablet    Sig: Take 1 tablet (75 mcg total) by mouth daily.    Dispense:  90 tablet    Refill:  3    General Health Maintenance   Due to her diabetes diagnosis, baseline eye and annual foot exams are required to ensure comprehensive health maintenance.  Follow-up   A follow-up in 2 weeks is scheduled to assess medication changes and CGM data, with a thyroid function recheck planned in 2 months.    This document was synthesized by artificial intelligence ([employer]bridge) using HIPAA-compliant recording of the clinical interaction;   We discussed the use of AI scribe software for clinical note transcription with the patient, who gave verbal consent to proceed.    Additional Info: This encounter employed state-of-the-art, real-time, collaborative documentation. The patient actively reviewed and assisted in updating their electronic medical record on a shared screen, ensuring transparency and facilitating joint problem-solving for the problem list, overview, and plan. This approach promotes accurate, informed care. The treatment plan was discussed and reviewed in detail, including medication safety, potential side effects, and all patient questions. We confirmed understanding and comfort with the plan. Follow-up instructions were established, including contacting the office for any concerns, returning if symptoms worsen, persist, or new symptoms develop, and precautions for potential emergency department visits.

## 2023-03-16 NOTE — Assessment & Plan Note (Signed)
Hypothyroidism   Her TSH was low upon admission, likely due to dehydration. We reduced her thyroid medication from 100 mcg to 75 mcg. We suspect transient thyroid changes due to acute illness and will continue the current thyroid medication at 75 mcg, with a recheck of thyroid function in 2 months.

## 2023-03-16 NOTE — Assessment & Plan Note (Signed)
Type 2 Diabetes Mellitus with Diabetic Ketoacidosis (DKA)   She presented with DKA, showing hyperglycemia, metabolic acidosis, and dehydration, initially managed with metformin, Wegovy, and insulin. Post-hospitalization for DKA, she showed improvement in BUN, creatinine, and CO2 levels. Now on Dexcom CGM, we discussed transitioning from [facility] to [facility] to reduce insulin dependency, aiming for 70-80% time in range on CGM. We will increase metformin to 1000 mg BID, transition to [facility] at a mid-level dose, discontinue mealtime insulin, and continue basal insulin at 20 units, reducing by 3-4 units per day if in range. A follow-up in 2 weeks will assess medication changes and CGM data, with a thyroid function recheck in 2 months. Baseline eye and annual foot exams are also planned.

## 2023-03-24 ENCOUNTER — Emergency Department (HOSPITAL_COMMUNITY): Payer: 59

## 2023-03-24 ENCOUNTER — Other Ambulatory Visit: Payer: Self-pay

## 2023-03-24 ENCOUNTER — Inpatient Hospital Stay (HOSPITAL_COMMUNITY)
Admission: EM | Admit: 2023-03-24 | Discharge: 2023-03-28 | DRG: 435 | Disposition: A | Discharge status: Home / Self Care | Payer: 59 | Attending: Internal Medicine | Admitting: Internal Medicine

## 2023-03-24 ENCOUNTER — Encounter (HOSPITAL_COMMUNITY): Payer: Self-pay | Admitting: Emergency Medicine

## 2023-03-24 DIAGNOSIS — E039 Hypothyroidism, unspecified: Secondary | ICD-10-CM

## 2023-03-24 DIAGNOSIS — K859 Acute pancreatitis without necrosis or infection, unspecified: Secondary | ICD-10-CM | POA: Diagnosis not present

## 2023-03-24 DIAGNOSIS — E1165 Type 2 diabetes mellitus with hyperglycemia: Secondary | ICD-10-CM

## 2023-03-24 DIAGNOSIS — Z888 Allergy status to other drugs, medicaments and biological substances status: Secondary | ICD-10-CM

## 2023-03-24 DIAGNOSIS — E119 Type 2 diabetes mellitus without complications: Secondary | ICD-10-CM

## 2023-03-24 DIAGNOSIS — E781 Pure hyperglyceridemia: Secondary | ICD-10-CM | POA: Diagnosis present

## 2023-03-24 DIAGNOSIS — K2289 Other specified disease of esophagus: Secondary | ICD-10-CM | POA: Diagnosis present

## 2023-03-24 DIAGNOSIS — E876 Hypokalemia: Secondary | ICD-10-CM | POA: Diagnosis present

## 2023-03-24 DIAGNOSIS — C252 Malignant neoplasm of tail of pancreas: Principal | ICD-10-CM | POA: Diagnosis present

## 2023-03-24 DIAGNOSIS — E785 Hyperlipidemia, unspecified: Secondary | ICD-10-CM | POA: Diagnosis not present

## 2023-03-24 DIAGNOSIS — E1122 Type 2 diabetes mellitus with diabetic chronic kidney disease: Secondary | ICD-10-CM

## 2023-03-24 DIAGNOSIS — I10 Essential (primary) hypertension: Secondary | ICD-10-CM

## 2023-03-24 DIAGNOSIS — Z6841 Body Mass Index (BMI) 40.0 and over, adult: Secondary | ICD-10-CM

## 2023-03-24 DIAGNOSIS — Z833 Family history of diabetes mellitus: Secondary | ICD-10-CM

## 2023-03-24 DIAGNOSIS — K2980 Duodenitis without bleeding: Secondary | ICD-10-CM | POA: Diagnosis present

## 2023-03-24 DIAGNOSIS — C787 Secondary malignant neoplasm of liver and intrahepatic bile duct: Secondary | ICD-10-CM | POA: Diagnosis present

## 2023-03-24 DIAGNOSIS — Z8249 Family history of ischemic heart disease and other diseases of the circulatory system: Secondary | ICD-10-CM

## 2023-03-24 DIAGNOSIS — E1169 Type 2 diabetes mellitus with other specified complication: Secondary | ICD-10-CM | POA: Diagnosis present

## 2023-03-24 DIAGNOSIS — Z7984 Long term (current) use of oral hypoglycemic drugs: Secondary | ICD-10-CM

## 2023-03-24 DIAGNOSIS — J45909 Unspecified asthma, uncomplicated: Secondary | ICD-10-CM

## 2023-03-24 DIAGNOSIS — E7800 Pure hypercholesterolemia, unspecified: Secondary | ICD-10-CM | POA: Diagnosis present

## 2023-03-24 DIAGNOSIS — Z79899 Other long term (current) drug therapy: Secondary | ICD-10-CM

## 2023-03-24 DIAGNOSIS — K2990 Gastroduodenitis, unspecified, without bleeding: Secondary | ICD-10-CM | POA: Diagnosis present

## 2023-03-24 DIAGNOSIS — Z794 Long term (current) use of insulin: Secondary | ICD-10-CM

## 2023-03-24 DIAGNOSIS — Z79890 Hormone replacement therapy (postmenopausal): Secondary | ICD-10-CM

## 2023-03-24 DIAGNOSIS — K8689 Other specified diseases of pancreas: Secondary | ICD-10-CM

## 2023-03-24 DIAGNOSIS — Z7985 Long-term (current) use of injectable non-insulin antidiabetic drugs: Secondary | ICD-10-CM

## 2023-03-24 DIAGNOSIS — G4730 Sleep apnea, unspecified: Secondary | ICD-10-CM | POA: Diagnosis present

## 2023-03-24 DIAGNOSIS — K2970 Gastritis, unspecified, without bleeding: Secondary | ICD-10-CM

## 2023-03-24 LAB — CBC
HCT: 39.7 % (ref 36.0–46.0)
Hemoglobin: 13.9 g/dL (ref 12.0–15.0)
MCH: 31.9 pg (ref 26.0–34.0)
MCHC: 35 g/dL (ref 30.0–36.0)
MCV: 91.1 fL (ref 80.0–100.0)
Platelets: 229 10*3/uL (ref 150–400)
RBC: 4.36 MIL/uL (ref 3.87–5.11)
RDW: 13.2 % (ref 11.5–15.5)
WBC: 9.4 10*3/uL (ref 4.0–10.5)
nRBC: 0 % (ref 0.0–0.2)

## 2023-03-24 LAB — LIPASE, BLOOD: Lipase: 1232 U/L — ABNORMAL HIGH (ref 11–51)

## 2023-03-24 LAB — BASIC METABOLIC PANEL
Anion gap: 16 — ABNORMAL HIGH (ref 5–15)
BUN: 9 mg/dL (ref 6–20)
CO2: 19 mmol/L — ABNORMAL LOW (ref 22–32)
Calcium: 10.1 mg/dL (ref 8.9–10.3)
Chloride: 101 mmol/L (ref 98–111)
Creatinine, Ser: 0.72 mg/dL (ref 0.44–1.00)
GFR, Estimated: >60 mL/min (ref >60)
Glucose, Bld: 207 mg/dL — ABNORMAL HIGH (ref 70–99)
Potassium: 3.3 mmol/L — ABNORMAL LOW (ref 3.5–5.1)
Sodium: 136 mmol/L (ref 135–145)

## 2023-03-24 LAB — CBG MONITORING, ED
Glucose-Capillary: 152 mg/dL — ABNORMAL HIGH (ref 70–99)
Glucose-Capillary: 125 mg/dL — ABNORMAL HIGH (ref 70–99)

## 2023-03-24 LAB — TROPONIN I (HIGH SENSITIVITY)
Troponin I (High Sensitivity): 8 ng/L (ref <18)
Troponin I (High Sensitivity): 6 ng/L (ref <18)

## 2023-03-24 LAB — HEPATIC FUNCTION PANEL
ALT: 29 U/L (ref 0–44)
AST: 34 U/L (ref 15–41)
Albumin: 4.2 g/dL (ref 3.5–5.0)
Alkaline Phosphatase: 81 U/L (ref 38–126)
Bilirubin, Direct: 0.1 mg/dL (ref 0.0–0.2)
Indirect Bilirubin: 0.9 mg/dL (ref 0.3–0.9)
Total Bilirubin: 1 mg/dL (ref 0.0–1.2)
Total Protein: 7.5 g/dL (ref 6.5–8.1)

## 2023-03-24 LAB — GLUCOSE, CAPILLARY: Glucose-Capillary: 126 mg/dL — ABNORMAL HIGH (ref 70–99)

## 2023-03-24 MED ORDER — ONDANSETRON HCL 4 MG/2ML IJ SOLN
4.0000 mg | Freq: Once | INTRAMUSCULAR | Status: AC
  Administered 2023-03-24: 4 mg via INTRAVENOUS
  Filled 2023-03-24: qty 2

## 2023-03-24 MED ORDER — ACETAMINOPHEN 325 MG PO TABS
650.0000 mg | ORAL_TABLET | Freq: Four times a day (QID) | ORAL | Status: DC | PRN
  Administered 2023-03-24: 650 mg via ORAL
  Filled 2023-03-24: qty 2

## 2023-03-24 MED ORDER — ACETAMINOPHEN 650 MG RE SUPP: 650.0000 mg | Freq: Four times a day (QID) | RECTAL | Status: DC | PRN

## 2023-03-24 MED ORDER — ALBUTEROL SULFATE (2.5 MG/3ML) 0.083% IN NEBU: 2.5000 mg | INHALATION_SOLUTION | Freq: Four times a day (QID) | RESPIRATORY_TRACT | Status: DC | PRN

## 2023-03-24 MED ORDER — PANTOPRAZOLE SODIUM 40 MG IV SOLR
40.0000 mg | Freq: Once | INTRAVENOUS | Status: AC
  Administered 2023-03-24: 40 mg via INTRAVENOUS
  Filled 2023-03-24: qty 10

## 2023-03-24 MED ORDER — ONDANSETRON HCL 4 MG PO TABS: 4.0000 mg | ORAL_TABLET | Freq: Four times a day (QID) | ORAL | Status: DC | PRN

## 2023-03-24 MED ORDER — HYDROCHLOROTHIAZIDE 12.5 MG PO TABS
12.5000 mg | ORAL_TABLET | Freq: Every day | ORAL | Status: DC
  Administered 2023-03-25: 12.5 mg via ORAL
  Filled 2023-03-24: qty 1

## 2023-03-24 MED ORDER — HYDROMORPHONE HCL 1 MG/ML IJ SOLN
0.5000 mg | Freq: Once | INTRAMUSCULAR | Status: AC
  Administered 2023-03-24: 0.5 mg via INTRAVENOUS
  Filled 2023-03-24: qty 1

## 2023-03-24 MED ORDER — LEVOTHYROXINE SODIUM 75 MCG PO TABS
75.0000 ug | ORAL_TABLET | Freq: Every day | ORAL | Status: DC
  Administered 2023-03-25 – 2023-03-28 (×4): 75 ug via ORAL
  Filled 2023-03-24 (×4): qty 1

## 2023-03-24 MED ORDER — POTASSIUM CHLORIDE IN NACL 20-0.9 MEQ/L-% IV SOLN
Freq: Once | INTRAVENOUS | Status: AC
  Filled 2023-03-24: qty 1000

## 2023-03-24 MED ORDER — ROSUVASTATIN CALCIUM 20 MG PO TABS
20.0000 mg | ORAL_TABLET | Freq: Every day | ORAL | Status: DC
  Administered 2023-03-25 – 2023-03-28 (×4): 20 mg via ORAL
  Filled 2023-03-24 (×4): qty 1

## 2023-03-24 MED ORDER — INSULIN ASPART 100 UNIT/ML IJ SOLN
0.0000 [IU] | INTRAMUSCULAR | Status: DC
  Administered 2023-03-24 – 2023-03-25 (×3): 2 [IU] via SUBCUTANEOUS
  Administered 2023-03-25: 3 [IU] via SUBCUTANEOUS
  Administered 2023-03-25 – 2023-03-27 (×3): 2 [IU] via SUBCUTANEOUS

## 2023-03-24 MED ORDER — LOSARTAN POTASSIUM-HCTZ 50-12.5 MG PO TABS: 1.0000 | ORAL_TABLET | Freq: Every day | ORAL | Status: DC

## 2023-03-24 MED ORDER — MONTELUKAST SODIUM 10 MG PO TABS
10.0000 mg | ORAL_TABLET | Freq: Every evening | ORAL | Status: DC
  Administered 2023-03-25 – 2023-03-27 (×3): 10 mg via ORAL
  Filled 2023-03-24 (×3): qty 1

## 2023-03-24 MED ORDER — SODIUM CHLORIDE 0.9% FLUSH
3.0000 mL | Freq: Two times a day (BID) | INTRAVENOUS | Status: DC
  Administered 2023-03-24 – 2023-03-27 (×7): 3 mL via INTRAVENOUS

## 2023-03-24 MED ORDER — DICYCLOMINE HCL 10 MG PO CAPS
10.0000 mg | ORAL_CAPSULE | Freq: Once | ORAL | Status: AC
  Administered 2023-03-24: 10 mg via ORAL
  Filled 2023-03-24: qty 1

## 2023-03-24 MED ORDER — ONDANSETRON HCL 4 MG/2ML IJ SOLN: 4.0000 mg | Freq: Four times a day (QID) | INTRAMUSCULAR | Status: DC | PRN

## 2023-03-24 MED ORDER — IOHEXOL 350 MG/ML SOLN
75.0000 mL | Freq: Once | INTRAVENOUS | Status: AC | PRN
  Administered 2023-03-24: 75 mL via INTRAVENOUS

## 2023-03-24 MED ORDER — ONDANSETRON 4 MG PO TBDP
4.0000 mg | ORAL_TABLET | Freq: Once | ORAL | Status: AC
  Administered 2023-03-24: 4 mg via ORAL
  Filled 2023-03-24: qty 1

## 2023-03-24 MED ORDER — SODIUM CHLORIDE 0.9 % IV BOLUS
1000.0000 mL | Freq: Once | INTRAVENOUS | Status: AC
  Administered 2023-03-24: 1000 mL via INTRAVENOUS

## 2023-03-24 MED ORDER — ENOXAPARIN SODIUM 40 MG/0.4ML IJ SOSY
40.0000 mg | PREFILLED_SYRINGE | INTRAMUSCULAR | Status: DC
  Administered 2023-03-24 – 2023-03-27 (×4): 40 mg via SUBCUTANEOUS
  Filled 2023-03-24 (×4): qty 0.4

## 2023-03-24 MED ORDER — ALBUTEROL SULFATE HFA 108 (90 BASE) MCG/ACT IN AERS: 1.0000 | INHALATION_SPRAY | Freq: Four times a day (QID) | RESPIRATORY_TRACT | Status: DC | PRN

## 2023-03-24 MED ORDER — LOSARTAN POTASSIUM 50 MG PO TABS
50.0000 mg | ORAL_TABLET | Freq: Every day | ORAL | Status: DC
  Administered 2023-03-25 – 2023-03-28 (×4): 50 mg via ORAL
  Filled 2023-03-24 (×4): qty 1

## 2023-03-24 MED ORDER — POTASSIUM CHLORIDE CRYS ER 20 MEQ PO TBCR
40.0000 meq | EXTENDED_RELEASE_TABLET | Freq: Once | ORAL | Status: AC
  Administered 2023-03-24: 40 meq via ORAL
  Filled 2023-03-24: qty 2

## 2023-03-24 MED ORDER — HYDROMORPHONE HCL 1 MG/ML IJ SOLN
0.5000 mg | INTRAMUSCULAR | Status: DC | PRN
  Administered 2023-03-24 – 2023-03-25 (×2): 0.5 mg via INTRAVENOUS
  Filled 2023-03-24 (×2): qty 0.5

## 2023-03-24 MED ORDER — POLYETHYLENE GLYCOL 3350 17 G PO PACK: 17.0000 g | PACK | Freq: Every day | ORAL | Status: DC | PRN

## 2023-03-24 NOTE — ED Triage Notes (Signed)
 Pt c/o N/V since 1730 tonight and chest pain.

## 2023-03-24 NOTE — H&P (Signed)
 History and Physical   Debbie [name] FMW:[id number] DOB: [birth year] DOA: 03/24/2023  PCP: Jesus Bernardino MATSU, MD   Patient coming from: Home  Chief Complaint: N/V/ Abdominal pain  HPI: Debbie [name] is a 53 y.o. female with medical history significant of hypertension, hyperlipidemia, hypothyroidism, diabetes, asthma, obesity, PCOS presenting with nausea vomiting and abdominal pain.  Presenting with 1 day of nausea vomiting and some abdominal pain.  States that she ate out at a restaurant yesterday but did not eat very much and had phlegm in And a salad.  Had nausea vomiting started when she got home as well as some episodes of diarrhea.  Reports abdominal pain that radiates to her chest and her back at times. Denies any blood in her vomit or diarrhea.  Recently diagnosed with diabetes.  Had been on Wegovy  prior to that for weight management.  Denies fevers, chills, chest pain, shortness of breath.  ED Course: Vital signs in the ED notable for blood pressure in the 1 6170 systolic.  Lab workup included CMP with potassium 3.3, bicarb 19, gap 16, glucose 207.  CBC within normal limits.  Troponin negative x 2.  Lipase elevated at 1232.  Chest x-ray showed no acute normality.  CT of the abdomen pelvis pending.  Patient received Dilaudid , Zofran , PPI, 40 mEq p.o. potassium, 1 L IV fluids and started on a rate of IV fluids in the ED.  Review of Systems: As per HPI otherwise all other systems reviewed and are negative.  Past Medical History:  Diagnosis Date   Acute renal failure (ARF) (HCC) 10/01/2015   Allergy    Depression    DKA, type 2 (HCC) 03/01/2023   Elevated cholesterol    History of endometrial ablation 05/20/2021   2021   Hyperlipidemia    Hypertension    Hypertriglyceridemia 02/08/2022   No components found for: TG Lab Results Component Value Date/Time  TRIG 125.0 08/18/2021 08:53 AM  TRIG 163.0 (H) 12/29/2020 09:20 AM  TRIG 148.0 08/04/2020 08:24 AM  TRIG 177.0  (H) 04/03/2019 07:30 AM     Hypothyroidism    Insomnia 11/08/2021   Morbid obesity (HCC) 02/08/2019   New onset type 2 diabetes mellitus (HCC) 02/23/2023   Type 2 Diabetes Mellitus (E11.9) Onset: 02/23/2023 Key Events:  Initial A1c: 11.1% Presenting with polydipsia Current Treatment: Initiating Metformin  and Mounjaro  Monitoring: Blood glucose monitoring TID Follow-up in 2 weeks Quality Metrics: Eye exam needed Diabetes education pending Tags: #NewProblem #RequiresFollowUp #PatientEducation     Seasonal allergies    Sleep apnea    wears cpap    Thyroid disease    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 Fleeta Smock     Past Surgical History:  Procedure Laterality Date   ABLATION  10/2019   ADENOIDECTOMY     COLONOSCOPY     DENTAL SURGERY     graft of mouth     Social History  reports that she has never smoked. She has never used smokeless tobacco. She reports that she does not drink alcohol and does not use drugs.  Allergies  Allergen Reactions   Atorvastatin Hives   Other Shortness Of Breath    Family History  Problem Relation Age of Onset   Heart disease Father    Hypertension Mother    Hyperlipidemia Mother    Breast cancer Maternal Grandmother    Prostate cancer Maternal Grandfather    Diabetes Maternal Grandfather    CAD Maternal Grandfather  Colon cancer Neg Hx    Colon polyps Neg Hx    Esophageal cancer Neg Hx    Stomach cancer Neg Hx    Rectal cancer Neg Hx   Reviewed on admission  Prior to Admission medications   Medication Sig Start Date End Date Taking? Authorizing Provider  albuterol  (VENTOLIN  HFA) 108 (90 Base) MCG/ACT inhaler 1 PUFF AS NEEDED EVERY 4 HOURS FOR COUGH/WHEEZE INHALATION 90 DAYS    [provider]  Albuterol -Budesonide (AIRSUPRA ) 90-80 MCG/ACT AERO Inhale 2 Inhalations into the lungs every 4 (four) hours as needed. Replaces albuterol , rescue inhaler 02/23/23   Jesus Bernardino MATSU, MD  Ascorbic Acid (VITAMIN C) 1000 MG tablet Take 1,000 mg by mouth daily.    [provider]  Azelastine -Fluticasone  137-50 MCG/ACT SUSP PLACE 1 SPRAY INTO THE NOSE EVERY 12 (TWELVE) HOURS. 07/21/21   Kip Ade, NP  benzonatate  (TESSALON  PERLES) 100 MG capsule Take 1 capsule (100 mg total) by mouth 3 (three) times daily. 02/23/23   Jesus Bernardino MATSU, MD  Blood Glucose Monitoring Suppl DEVI 1 each by Does not apply route in the morning, at noon, and at bedtime. May substitute to any manufacturer covered by patient's insurance. 03/02/23   Ghimire, Donalda HERO, MD  Cholecalciferol (D3-1000 PO) Take 1,000 Units by mouth daily.    [provider]  Continuous Glucose Sensor (DEXCOM G7 SENSOR) MISC 1 Act by Does not apply route daily. 02/23/23   Jesus Bernardino MATSU, MD  Cyanocobalamin (VITAMIN B12 PO) Take by mouth daily.    [provider]  escitalopram  (LEXAPRO ) 20 MG tablet TAKE 1 TABLET BY MOUTH EVERY DAY 12/21/22   Jesus Bernardino MATSU, MD  Glucose Blood (BLOOD GLUCOSE TEST STRIPS) STRP 1 each by In Vitro route in the morning, at noon, and at bedtime. May substitute to any manufacturer covered by patient's insurance. 03/02/23 04/01/23  Ghimire, Donalda HERO, MD  ibuprofen (ADVIL) 600 MG tablet Take 600 mg by mouth every 8 (eight) hours as needed for cramping, moderate pain, mild pain, headache or fever. 10/15/21   [provider]  insulin  aspart (NOVOLOG  FLEXPEN) 100 UNIT/ML FlexPen 0-9 Units, Subcutaneous, 3 times daily with meals CBG < 70: Implement Hypoglycemia measures CBG 70 - 120: 0 units CBG 121 - 150: 1 unit CBG 151 - 200: 2 units CBG 201 - 250: 3 units CBG 251 - 300: 5 units CBG 301 - 350: 7 units CBG 351 - 400: 9 units CBG > 400: call MD Patient not taking: Reported on 03/16/2023 03/02/23   Raenelle Donalda HERO, MD  insulin  glargine (LANTUS ) 100 UNIT/ML Solostar Pen Inject 20 Units into the skin daily. 03/03/23   Ghimire, Donalda HERO, MD  Insulin  Pen Needle 32G X 4 MM  MISC 1 each 4 (four) times daily. 03/02/23   Ghimire, Donalda HERO, MD  Lancet Device MISC 1 each by Does not apply route in the morning, at noon, and at bedtime. May substitute to any manufacturer covered by patient's insurance. 03/02/23 04/01/23  GhimireDonalda HERO, MD  Lancets Misc. MISC 1 each by Does not apply route in the morning, at noon, and at bedtime. May substitute to any manufacturer covered by patient's insurance. 03/02/23 04/01/23  Ghimire, Donalda HERO, MD  levocetirizine (XYZAL ) 5 MG tablet Take 1 tablet (5 mg total) by mouth every evening. 01/16/23   Jesus Bernardino MATSU, MD  levothyroxine  (SYNTHROID ) 75 MCG tablet Take 1 tablet (75 mcg total) by mouth daily. 03/02/23   Ghimire, Donalda HERO, MD  levothyroxine  (SYNTHROID ) 75 MCG tablet Take 1 tablet (75 mcg total) by mouth daily. 03/16/23   Jesus Bernardino MATSU, MD  losartan -hydrochlorothiazide  (HYZAAR) 50-12.5 MG tablet TAKE 1 TABLET BY MOUTH DAILY. OK TO START WITH JUST HALF TABLET DAILY FOR WEEK 1 AND LEAVE IT THERE IF GOAL OF 140/90 REACHED. Patient taking differently: Take 1 tablet by mouth daily. 10/12/22   Jesus Bernardino MATSU, MD  metFORMIN  (GLUCOPHAGE ) 500 MG tablet Take 1 tablet (500 mg total) by mouth 2 (two) times daily with a meal. 02/26/23   Jesus Bernardino MATSU, MD  metFORMIN  (GLUCOPHAGE ) 500 MG tablet Take 2 tablets (1,000 mg total) by mouth 2 (two) times daily with a meal. Replaces 500 mg twice daily dosing for better sugar control 03/16/23   Jesus Bernardino MATSU, MD  montelukast  (SINGULAIR ) 10 MG tablet Take 1 tablet by mouth every evening. 07/13/15   [provider]  rosuvastatin  (CRESTOR ) 20 MG tablet TAKE 1 TABLET BY MOUTH EVERY DAY 12/21/22   Jesus Bernardino MATSU, MD  Saline (SIMPLY SALINE) 0.9 % AERS Place 2 each into the nose as directed. Use nightly for sinus hygiene long-term.  Can also be used as many times daily as desired to assist with clearing congested sinuses. 01/16/23   Jesus Bernardino MATSU, MD  tirzepatide  (MOUNJARO ) 5 MG/0.5ML Pen  Inject 5 mg into the skin once a week. Replaces Wegovy .  Move straight to this from Wegovy  0.5 mg 03/16/23   Jesus Bernardino MATSU, MD  topiramate  (TOPAMAX ) 25 MG tablet TAKE 1 TABLET (25 MG TOTAL) BY MOUTH 2 (TWO) TIMES DAILY. TAKE JUST ONE TABLET DAILY FOR THE FIRST WEEK. 10/12/22   Jesus Bernardino MATSU, MD  Zinc 50 MG TABS Take by mouth daily.    [provider]    Physical Exam: Vitals:   03/24/23 0025 03/24/23 0031 03/24/23 0506 03/24/23 0933  BP: (!) 173/110  (!) 179/93 (!) 161/93  Pulse: 87  82 78  Resp: 15  16 16   Temp: 98.1 F (36.7 C)  98.2 F (36.8 C) 99 F (37.2 C)  TempSrc:   Oral   SpO2: 95%  97% 98%  Weight:  105.6 kg    Height:  5' 4 (1.626 m)      Physical Exam Constitutional:      General: She is not in acute distress.    Appearance: Normal appearance. She is obese.  HENT:     Head: Normocephalic and atraumatic.     Mouth/Throat:     Mouth: Mucous membranes are moist.     Pharynx: Oropharynx is clear.  Eyes:     Extraocular Movements: Extraocular movements intact.     Pupils: Pupils are equal, round, and reactive to light.  Cardiovascular:     Rate and Rhythm: Normal rate and regular rhythm.     Pulses: Normal pulses.     Heart sounds: Normal heart sounds.  Pulmonary:     Effort: Pulmonary effort is normal. No respiratory distress.     Breath sounds: Normal breath sounds.  Abdominal:     General: Bowel sounds are normal. There is no distension.     Palpations: Abdomen is soft.     Tenderness: There is abdominal tenderness.  Musculoskeletal:        General: No swelling or deformity.  Skin:    General: Skin is warm and dry.  Neurological:     General: No focal deficit present.     Mental Status: Mental status is at baseline.  Labs on Admission: I have personally reviewed following labs and imaging studies  CBC: Recent Labs  Lab 03/24/23 0038  WBC 9.4  HGB 13.9  HCT 39.7  MCV 91.1  PLT 229    Basic Metabolic Panel: Recent Labs  Lab  03/24/23 0038  NA 136  K 3.3*  CL 101  CO2 19*  GLUCOSE 207*  BUN 9  CREATININE 0.72  CALCIUM  10.1    GFR: Estimated Creatinine Clearance: 97.5 mL/min (by C-G formula based on SCr of 0.72 mg/dL).  Liver Function Tests: Recent Labs  Lab 03/24/23 1127  AST 34  ALT 29  ALKPHOS 81  BILITOT 1.0  PROT 7.5  ALBUMIN 4.2    Urine analysis:    Component Value Date/Time   COLORURINE YELLOW 03/01/2023 0902   APPEARANCEUR HAZY (A) 03/01/2023 0902   LABSPEC 1.023 03/01/2023 0902   PHURINE 5.0 03/01/2023 0902   GLUCOSEU >=500 (A) 03/01/2023 0902   GLUCOSEU NEGATIVE 08/18/2021 0853   HGBUR NEGATIVE 03/01/2023 0902   BILIRUBINUR NEGATIVE 03/01/2023 0902   KETONESUR 80 (A) 03/01/2023 0902   PROTEINUR 30 (A) 03/01/2023 0902   UROBILINOGEN 1.0 08/18/2021 0853   NITRITE NEGATIVE 03/01/2023 0902   LEUKOCYTESUR TRACE (A) 03/01/2023 0902    Radiological Exams on Admission: DG Chest 2 View Result Date: 03/24/2023 CLINICAL DATA:  Chest pain EXAM: CHEST - 2 VIEW COMPARISON:  03/01/2023 FINDINGS: The heart size and mediastinal contours are within normal limits. Both lungs are clear. The visualized skeletal structures are unremarkable. IMPRESSION: No active cardiopulmonary disease. Electronically Signed   By: Oneil Devonshire M.D.   On: 03/24/2023 00:56   EKG: Independently reviewed.  Sinus rhythm at 77 bpm.  Significant baseline wander.  Nonspecific T wave flattening.  Low voltage multiple leads.  Assessment/Plan Active Problems:   Essential hypertension   Hyperlipidemia   Hypothyroidism   Morbid obesity (HCC)   Type 2 diabetes mellitus (HCC)   Asthma, chronic   Pancreatitis > Presenting with 1 day of nausea vomiting abdominal pain that started after a meal yesterday. > Lipase elevated to 1232.  CT pending (though my initial review of imaging is suspicious for pancreatic pseudocyst). > Clinically consistent with pancreatitis.  Unclear etiology.  Has been on Wegovy  for weight management  for some time and this could be related.  Will follow-up CT results to evaluate for biliary etiology. - Monitor on MedSurg with continuous pulse ox overnight - N.p.o. except sips with meds and ice chips - IV fluids - As needed pain medication  Hypokalemia > Potassium 3.3 in the ED.  Received 40 mEq p.o. potassium. - Continue to trend  Hypertension - Continue home losartan  tomorrow  Hyperlipidemia - Continue home rosuvastatin   Hypothyroidism - Continue home Synthroid   Diabetes - SSI as patient is n.p.o.  Obesity - Noted  Asthma - Continue home Singulair  and as needed albuterol   DVT prophylaxis: Lovenox  Code Status:   Full Family Communication:  None on admission, reports husband is on his way.  Disposition Plan:   Patient is from:  Home  Anticipated DC to:  Home  Anticipated DC date:  1 to 3 days  Anticipated DC barriers: None  Consults called:  None Admission status:  Observation, MedSurg with continuous pulse ox  Severity of Illness: The appropriate patient status for this patient is OBSERVATION. Observation status is judged to be reasonable and necessary in order to provide the required intensity of service to ensure the patient's safety. The patient's presenting symptoms, physical exam findings,  and initial radiographic and laboratory data in the context of their medical condition is felt to place them at decreased risk for further clinical deterioration. Furthermore, it is anticipated that the patient will be medically stable for discharge from the hospital within 2 midnights of admission.    Debbie KATHEE Scurry MD [facility]  How to contact the [facility] Attending or Consulting provider 7A - 7P or covering provider during after hours 7P -7A, for this patient?   Check the care team in [facility] and look for a) attending/consulting [facility] provider listed and b) the [facility] team listed Log into [email or web address] and use [place]'s universal password to access. If you do not have  the password, please contact the hospital operator. Locate the [facility] provider you are looking for under [facility] and page to a number that you can be directly reached. If you still have difficulty reaching the provider, please page the [facility] (Director on Call) for the Hospitalists listed on amion for assistance.  03/24/2023, 2:03 PM

## 2023-03-24 NOTE — Plan of Care (Signed)

## 2023-03-24 NOTE — Progress Notes (Signed)
 Provided emotional and spiritual support.Lunette Stands will follow as needed.  Debbie [name], [place], [facility], Pager [phone]

## 2023-03-24 NOTE — ED Provider Notes (Signed)
 [place] EMERGENCY DEPARTMENT AT [facility] Provider Note   CSN: [id number] Arrival date & time: 03/24/23  0009     History  Chief Complaint  Patient presents with   Emesis    Debbie [name] is a 53 y.o. female.  HPI Patient reports she ate out at a restaurant yesterday evening.  She did not eat much.  She had a small amount of filet mignon and some salad.  She reports after getting home she got nauseated and started having multiple episodes of vomiting.  Patient had a couple of episodes of diarrhea.  She reports after a number of hours of vomiting she has severe pain that went from her central upper abdomen into her chest as well as into her lower abdomen.  She did not see any blood while vomiting.  Patient reports that the most severe of the pain has become somewhat improved but still has a lot of discomfort.  Patient was given Zofran  on arrival to the emergency department and has not had repeat vomiting since 4 AM.    Home Medications Prior to Admission medications   Medication Sig Start Date End Date Taking? Authorizing Provider  albuterol  (VENTOLIN  HFA) 108 (90 Base) MCG/ACT inhaler 1 PUFF AS NEEDED EVERY 4 HOURS FOR COUGH/WHEEZE INHALATION 90 DAYS    [provider]  Albuterol -Budesonide (AIRSUPRA ) 90-80 MCG/ACT AERO Inhale 2 Inhalations into the lungs every 4 (four) hours as needed. Replaces albuterol , rescue inhaler 02/23/23   Jesus Bernardino MATSU, MD  Ascorbic Acid (VITAMIN C) 1000 MG tablet Take 1,000 mg by mouth daily.    [provider]  Azelastine -Fluticasone  137-50 MCG/ACT SUSP PLACE 1 SPRAY INTO THE NOSE EVERY 12 (TWELVE) HOURS. 07/21/21   Kip Ade, NP  benzonatate  (TESSALON  PERLES) 100 MG capsule Take 1 capsule (100 mg total) by mouth 3 (three) times daily. 02/23/23   Jesus Bernardino MATSU, MD  Blood Glucose Monitoring Suppl DEVI 1 each by Does not apply route in the morning, at noon, and at bedtime. May substitute to any manufacturer covered by  patient's insurance. 03/02/23   Ghimire, Donalda HERO, MD  Cholecalciferol (D3-1000 PO) Take 1,000 Units by mouth daily.    [provider]  Continuous Glucose Sensor (DEXCOM G7 SENSOR) MISC 1 Act by Does not apply route daily. 02/23/23   Jesus Bernardino MATSU, MD  Cyanocobalamin (VITAMIN B12 PO) Take by mouth daily.    [provider]  escitalopram  (LEXAPRO ) 20 MG tablet TAKE 1 TABLET BY MOUTH EVERY DAY 12/21/22   Jesus Bernardino MATSU, MD  Glucose Blood (BLOOD GLUCOSE TEST STRIPS) STRP 1 each by In Vitro route in the morning, at noon, and at bedtime. May substitute to any manufacturer covered by patient's insurance. 03/02/23 04/01/23  Ghimire, Donalda HERO, MD  ibuprofen (ADVIL) 600 MG tablet Take 600 mg by mouth every 8 (eight) hours as needed for cramping, moderate pain, mild pain, headache or fever. 10/15/21   [provider]  insulin  aspart (NOVOLOG  FLEXPEN) 100 UNIT/ML FlexPen 0-9 Units, Subcutaneous, 3 times daily with meals CBG < 70: Implement Hypoglycemia measures CBG 70 - 120: 0 units CBG 121 - 150: 1 unit CBG 151 - 200: 2 units CBG 201 - 250: 3 units CBG 251 - 300: 5 units CBG 301 - 350: 7 units CBG 351 - 400: 9 units CBG > 400: call MD Patient not taking: Reported on 03/16/2023 03/02/23   Raenelle Donalda HERO, MD  insulin  glargine (LANTUS ) 100 UNIT/ML Solostar Pen Inject 20  Units into the skin daily. 03/03/23   Ghimire, Donalda HERO, MD  Insulin  Pen Needle 32G X 4 MM MISC 1 each 4 (four) times daily. 03/02/23   Ghimire, Donalda HERO, MD  Lancet Device MISC 1 each by Does not apply route in the morning, at noon, and at bedtime. May substitute to any manufacturer covered by patient's insurance. 03/02/23 04/01/23  GhimireDonalda HERO, MD  Lancets Misc. MISC 1 each by Does not apply route in the morning, at noon, and at bedtime. May substitute to any manufacturer covered by patient's insurance. 03/02/23 04/01/23  Ghimire, Donalda HERO, MD  levocetirizine (XYZAL ) 5 MG tablet Take 1 tablet (5 mg total)  by mouth every evening. 01/16/23   Jesus Bernardino MATSU, MD  levothyroxine  (SYNTHROID ) 75 MCG tablet Take 1 tablet (75 mcg total) by mouth daily. 03/02/23   Ghimire, Donalda HERO, MD  levothyroxine  (SYNTHROID ) 75 MCG tablet Take 1 tablet (75 mcg total) by mouth daily. 03/16/23   Jesus Bernardino MATSU, MD  losartan -hydrochlorothiazide  (HYZAAR) 50-12.5 MG tablet TAKE 1 TABLET BY MOUTH DAILY. OK TO START WITH JUST HALF TABLET DAILY FOR WEEK 1 AND LEAVE IT THERE IF GOAL OF 140/90 REACHED. Patient taking differently: Take 1 tablet by mouth daily. 10/12/22   Jesus Bernardino MATSU, MD  metFORMIN  (GLUCOPHAGE ) 500 MG tablet Take 1 tablet (500 mg total) by mouth 2 (two) times daily with a meal. 02/26/23   Jesus Bernardino MATSU, MD  metFORMIN  (GLUCOPHAGE ) 500 MG tablet Take 2 tablets (1,000 mg total) by mouth 2 (two) times daily with a meal. Replaces 500 mg twice daily dosing for better sugar control 03/16/23   Jesus Bernardino MATSU, MD  montelukast  (SINGULAIR ) 10 MG tablet Take 1 tablet by mouth every evening. 07/13/15   [provider]  rosuvastatin  (CRESTOR ) 20 MG tablet TAKE 1 TABLET BY MOUTH EVERY DAY 12/21/22   Jesus Bernardino MATSU, MD  Saline (SIMPLY SALINE) 0.9 % AERS Place 2 each into the nose as directed. Use nightly for sinus hygiene long-term.  Can also be used as many times daily as desired to assist with clearing congested sinuses. 01/16/23   Jesus Bernardino MATSU, MD  tirzepatide  (MOUNJARO ) 5 MG/0.5ML Pen Inject 5 mg into the skin once a week. Replaces Wegovy .  Move straight to this from Wegovy  0.5 mg 03/16/23   Jesus Bernardino MATSU, MD  topiramate  (TOPAMAX ) 25 MG tablet TAKE 1 TABLET (25 MG TOTAL) BY MOUTH 2 (TWO) TIMES DAILY. TAKE JUST ONE TABLET DAILY FOR THE FIRST WEEK. 10/12/22   Jesus Bernardino MATSU, MD  Zinc 50 MG TABS Take by mouth daily.    [provider]      Allergies    Atorvastatin and Other    Review of Systems   Review of Systems  Physical Exam Updated Vital Signs BP (!) 161/93 (BP Location: Left Arm)    Pulse 78   Temp 99 F (37.2 C)   Resp 16   Ht 5' 4 (1.626 m)   Wt 105.6 kg   SpO2 98%   BMI 39.96 kg/m  Physical Exam Constitutional:      Comments: Alert, nontoxic, clear mental status no respiratory distress well-nourished well-developed.  HENT:     Mouth/Throat:     Pharynx: Oropharynx is clear.  Eyes:     Extraocular Movements: Extraocular movements intact.     Conjunctiva/sclera: Conjunctivae normal.  Cardiovascular:     Rate and Rhythm: Normal rate and regular rhythm.  Pulmonary:     Effort: Pulmonary  effort is normal.     Breath sounds: Normal breath sounds.  Abdominal:     Comments: Abdomen is diffusely tender to palpation.  No guarding.  No localizing.  Musculoskeletal:        General: No swelling or tenderness. Normal range of motion.     Right lower leg: No edema.     Left lower leg: No edema.  Skin:    General: Skin is warm and dry.  Neurological:     General: No focal deficit present.     Mental Status: She is oriented to person, place, and time.     Motor: No weakness.     Coordination: Coordination normal.  Psychiatric:        Mood and Affect: Mood normal.     ED Results / Procedures / Treatments   Labs (all labs ordered are listed, but only abnormal results are displayed) Labs Reviewed  BASIC METABOLIC PANEL - Abnormal; Notable for the following components:      Result Value   Potassium 3.3 (*)    CO2 19 (*)    Glucose, Bld 207 (*)    Anion gap 16 (*)    All other components within normal limits  LIPASE, BLOOD - Abnormal; Notable for the following components:   Lipase 1,232 (*)    All other components within normal limits  CBG MONITORING, ED - Abnormal; Notable for the following components:   Glucose-Capillary 152 (*)    All other components within normal limits  CBC  HEPATIC FUNCTION PANEL  TROPONIN I (HIGH SENSITIVITY)  TROPONIN I (HIGH SENSITIVITY)    EKG None  Radiology DG Chest 2 View Result Date: 03/24/2023 CLINICAL DATA:   Chest pain EXAM: CHEST - 2 VIEW COMPARISON:  03/01/2023 FINDINGS: The heart size and mediastinal contours are within normal limits. Both lungs are clear. The visualized skeletal structures are unremarkable. IMPRESSION: No active cardiopulmonary disease. Electronically Signed   By: Oneil Devonshire M.D.   On: 03/24/2023 00:56    Procedures Procedures    Medications Ordered in ED Medications  potassium chloride  SA (KLOR-CON  M) CR tablet 40 mEq (has no administration in time range)  dicyclomine  (BENTYL ) capsule 10 mg (has no administration in time range)  0.9 % NaCl with KCl 20 mEq/ L  infusion (has no administration in time range)  HYDROmorphone  (DILAUDID ) injection 0.5 mg (has no administration in time range)  ondansetron  (ZOFRAN -ODT) disintegrating tablet 4 mg (4 mg Oral Given 03/24/23 0035)  pantoprazole  (PROTONIX ) injection 40 mg (40 mg Intravenous Given 03/24/23 1134)  ondansetron  (ZOFRAN ) injection 4 mg (4 mg Intravenous Given 03/24/23 1134)  sodium chloride  0.9 % bolus 1,000 mL (1,000 mLs Intravenous New Bag/Given 03/24/23 1136)    ED Course/ Medical Decision Making/ A&P                                 Medical Decision Making Amount and/or Complexity of Data Reviewed Labs: ordered. Radiology: ordered.  Risk Prescription drug management. Decision regarding hospitalization.   Presents as outlined with predominantly vomiting but also diarrheal illness of fairly abrupt onset.  Patient has associated abdominal pain that is nonlocalizing at this time.  Differential diagnosis includes food related illness\viral gastroenteritis\bowel obstruction\pancreatitis\biliary colic.  Will proceed with diagnostic evaluation.  Will treat with Protonix  and fluids.  Troponin 6, metabolic panel potassium 3.3 glucose 207 GFR greater than 16 anion gap 16 CBC normal.  Chest x-ray interpreted radiology  no acute findings.  Lipase 1100 LFTs normal.  At this time findings are consistent with pancreatitis.   Patient has a lot of upper abdominal discomfort and some pain through to the back.  Will proceed with CT abdomen pelvis.  Patient will require admission for pancreatitis with comorbid diabetes.  At this time she has not had further vomiting.  Will continue fluid resuscitation and pain control with Dilaudid .  Consult: Reviewed with Dr. Seena, hospitalist service for admission.        Final Clinical Impression(s) / ED Diagnoses Final diagnoses:  Acute pancreatitis, unspecified complication status, unspecified pancreatitis type  Type 2 diabetes mellitus with other specified complication, unspecified whether long term insulin  use [facility])    Rx / DC Orders ED Discharge Orders     None         Armenta Canning, MD 03/24/23 1559

## 2023-03-24 NOTE — ED Notes (Signed)
 Pt. Stated that she needed her insulin. Triage RN notified and cbg checked. RN notified

## 2023-03-25 DIAGNOSIS — Z794 Long term (current) use of insulin: Secondary | ICD-10-CM | POA: Diagnosis not present

## 2023-03-25 DIAGNOSIS — E7800 Pure hypercholesterolemia, unspecified: Secondary | ICD-10-CM | POA: Diagnosis present

## 2023-03-25 DIAGNOSIS — R112 Nausea with vomiting, unspecified: Secondary | ICD-10-CM | POA: Diagnosis not present

## 2023-03-25 DIAGNOSIS — I10 Essential (primary) hypertension: Secondary | ICD-10-CM | POA: Diagnosis present

## 2023-03-25 DIAGNOSIS — E781 Pure hyperglyceridemia: Secondary | ICD-10-CM | POA: Diagnosis present

## 2023-03-25 DIAGNOSIS — K859 Acute pancreatitis without necrosis or infection, unspecified: Secondary | ICD-10-CM | POA: Diagnosis present

## 2023-03-25 DIAGNOSIS — E876 Hypokalemia: Secondary | ICD-10-CM | POA: Diagnosis present

## 2023-03-25 DIAGNOSIS — K2970 Gastritis, unspecified, without bleeding: Secondary | ICD-10-CM | POA: Diagnosis present

## 2023-03-25 DIAGNOSIS — K8500 Idiopathic acute pancreatitis without necrosis or infection: Secondary | ICD-10-CM | POA: Diagnosis not present

## 2023-03-25 DIAGNOSIS — R109 Unspecified abdominal pain: Secondary | ICD-10-CM | POA: Diagnosis not present

## 2023-03-25 DIAGNOSIS — Z7984 Long term (current) use of oral hypoglycemic drugs: Secondary | ICD-10-CM | POA: Diagnosis not present

## 2023-03-25 DIAGNOSIS — Z8249 Family history of ischemic heart disease and other diseases of the circulatory system: Secondary | ICD-10-CM | POA: Diagnosis not present

## 2023-03-25 DIAGNOSIS — C787 Secondary malignant neoplasm of liver and intrahepatic bile duct: Secondary | ICD-10-CM | POA: Diagnosis present

## 2023-03-25 DIAGNOSIS — K2990 Gastroduodenitis, unspecified, without bleeding: Secondary | ICD-10-CM | POA: Diagnosis present

## 2023-03-25 DIAGNOSIS — R590 Localized enlarged lymph nodes: Secondary | ICD-10-CM | POA: Diagnosis not present

## 2023-03-25 DIAGNOSIS — K2289 Other specified disease of esophagus: Secondary | ICD-10-CM | POA: Diagnosis present

## 2023-03-25 DIAGNOSIS — E1169 Type 2 diabetes mellitus with other specified complication: Secondary | ICD-10-CM | POA: Diagnosis present

## 2023-03-25 DIAGNOSIS — J45909 Unspecified asthma, uncomplicated: Secondary | ICD-10-CM | POA: Diagnosis present

## 2023-03-25 DIAGNOSIS — K2980 Duodenitis without bleeding: Secondary | ICD-10-CM | POA: Diagnosis present

## 2023-03-25 DIAGNOSIS — Z79890 Hormone replacement therapy (postmenopausal): Secondary | ICD-10-CM | POA: Diagnosis not present

## 2023-03-25 DIAGNOSIS — Z7985 Long-term (current) use of injectable non-insulin antidiabetic drugs: Secondary | ICD-10-CM | POA: Diagnosis not present

## 2023-03-25 DIAGNOSIS — E119 Type 2 diabetes mellitus without complications: Secondary | ICD-10-CM | POA: Diagnosis not present

## 2023-03-25 DIAGNOSIS — K8689 Other specified diseases of pancreas: Secondary | ICD-10-CM | POA: Diagnosis not present

## 2023-03-25 DIAGNOSIS — Z6841 Body Mass Index (BMI) 40.0 and over, adult: Secondary | ICD-10-CM | POA: Diagnosis not present

## 2023-03-25 DIAGNOSIS — C252 Malignant neoplasm of tail of pancreas: Secondary | ICD-10-CM | POA: Diagnosis present

## 2023-03-25 DIAGNOSIS — Z888 Allergy status to other drugs, medicaments and biological substances status: Secondary | ICD-10-CM | POA: Diagnosis not present

## 2023-03-25 DIAGNOSIS — E039 Hypothyroidism, unspecified: Secondary | ICD-10-CM | POA: Diagnosis present

## 2023-03-25 DIAGNOSIS — G4730 Sleep apnea, unspecified: Secondary | ICD-10-CM | POA: Diagnosis present

## 2023-03-25 LAB — COMPREHENSIVE METABOLIC PANEL
ALT: 22 U/L (ref 0–44)
AST: 24 U/L (ref 15–41)
Albumin: 3.8 g/dL (ref 3.5–5.0)
Alkaline Phosphatase: 71 U/L (ref 38–126)
Anion gap: 10 (ref 5–15)
BUN: 5 mg/dL — ABNORMAL LOW (ref 6–20)
CO2: 24 mmol/L (ref 22–32)
Calcium: 8.9 mg/dL (ref 8.9–10.3)
Chloride: 105 mmol/L (ref 98–111)
Creatinine, Ser: 0.58 mg/dL (ref 0.44–1.00)
GFR, Estimated: >60 mL/min (ref >60)
Glucose, Bld: 126 mg/dL — ABNORMAL HIGH (ref 70–99)
Potassium: 3.2 mmol/L — ABNORMAL LOW (ref 3.5–5.1)
Sodium: 139 mmol/L (ref 135–145)
Total Bilirubin: 1.1 mg/dL (ref 0.0–1.2)
Total Protein: 6.7 g/dL (ref 6.5–8.1)

## 2023-03-25 LAB — GLUCOSE, CAPILLARY
Glucose-Capillary: 110 mg/dL — ABNORMAL HIGH (ref 70–99)
Glucose-Capillary: 118 mg/dL — ABNORMAL HIGH (ref 70–99)
Glucose-Capillary: 128 mg/dL — ABNORMAL HIGH (ref 70–99)
Glucose-Capillary: 130 mg/dL — ABNORMAL HIGH (ref 70–99)
Glucose-Capillary: 183 mg/dL — ABNORMAL HIGH (ref 70–99)
Glucose-Capillary: 92 mg/dL (ref 70–99)

## 2023-03-25 LAB — CBC
HCT: 38.3 % (ref 36.0–46.0)
Hemoglobin: 13.5 g/dL (ref 12.0–15.0)
MCH: 31.8 pg (ref 26.0–34.0)
MCHC: 35.2 g/dL (ref 30.0–36.0)
MCV: 90.1 fL (ref 80.0–100.0)
Platelets: 181 10*3/uL (ref 150–400)
RBC: 4.25 MIL/uL (ref 3.87–5.11)
RDW: 13.6 % (ref 11.5–15.5)
WBC: 7.9 10*3/uL (ref 4.0–10.5)
nRBC: 0 % (ref 0.0–0.2)

## 2023-03-25 LAB — LIPASE, BLOOD: Lipase: 262 U/L — ABNORMAL HIGH (ref 11–51)

## 2023-03-25 MED ORDER — SODIUM CHLORIDE 0.9 % IV SOLN: INTRAVENOUS | Status: DC

## 2023-03-25 MED ORDER — POTASSIUM CHLORIDE CRYS ER 20 MEQ PO TBCR
40.0000 meq | EXTENDED_RELEASE_TABLET | Freq: Once | ORAL | Status: AC
  Administered 2023-03-25: 40 meq via ORAL
  Filled 2023-03-25: qty 2

## 2023-03-25 NOTE — Consult Note (Signed)
 CROSS COVER [facility] Reason for Consult: Abdominal pain with pancreatic mass on CT scan. Referring Physician: [facility].  Debbie [name] is an 53 y.o. female.  HPI: Debbie [name] is a 53 year old white female who was hospitalized on 03/01/2023 with diabetic ketoacidosis.  She claims that he hemoglobin A1c increased from 5.6-11.1 in 6 months.  She also has a history of hypertension hyperlipidemia hypothyroidism obstructive sleep apnea on CPAP.  She was in her usual state of health till 2 days ago when she developed severe abdominal pain prompting her to come to the emergency room. He claims the symptoms started after she ate out at [employer]. She got progressively worse with nausea and had multiple episodes of vomiting when she returned home in the emergency room she was given some Zofran  for symptomatic relief. A CT done on admission revealed a 3 x 4.6 cm pancreatic tail mass [highly concerning for pancreatic adenocarcinoma with extension of the tumor into the splenic artery branch which is expanded with heterogeneous tumor and complete occlusion of the splenic vein posterior to the pancreas] with multiple target-like liver lesions favoring a pancreatic malignancy malignancy with liver metastatic disease along with fat stranding predominantly second and third portion of the duodenum extending around the pancreatic head and uncinate process favoring duodenitis secondary to pancreatic head and uncinate process involvement; mild peripheral arthrosclerotic vascular calcification of the aorta and major branches were noted along with a tiny fat-containing umbilical hernia pulm level degenerative disc changes were noticed as well; diverticulosis was noted in the left colon with no evidence of diverticulitis.  Her last colonoscopy was done by Dr. Eda in 2022 when sigmoid diverticulosis was noted a small polyp was removed.  She denies any family history of pancreatic cancer but her maternal grandmother died of  metastatic cancer but the primary was unknown. Her father had gallstones and neuro some in the pancreas. Her appetite has been somewhat poor and she has lost 30 pounds within the last couple of months.  She was noted to have a lipase of 1232 on admission which is down to 262 today.  Past Medical History:  Diagnosis Date   Acute renal failure (ARF) (HCC) 10/01/2015   Allergy    Depression    DKA, type 2 (HCC) 03/01/2023   Elevated cholesterol    History of endometrial ablation 05/20/2021   2021   Hyperlipidemia    Hypertension    Hypertriglyceridemia 02/08/2022   No components found for: TG Lab Results Component Value Date/Time  TRIG 125.0 08/18/2021 08:53 AM  TRIG 163.0 (H) 12/29/2020 09:20 AM  TRIG 148.0 08/04/2020 08:24 AM  TRIG 177.0 (H) 04/03/2019 07:30 AM     Hypothyroidism    Insomnia 11/08/2021   Morbid obesity (HCC) 02/08/2019   New onset type 2 diabetes mellitus (HCC) 02/23/2023   Type 2 Diabetes Mellitus (E11.9) Onset: 02/23/2023 Key Events:  Initial A1c: 11.1% Presenting with polydipsia Current Treatment: Initiating Metformin  and Mounjaro  Monitoring: Blood glucose monitoring TID Follow-up in 2 weeks Quality Metrics: Eye exam needed Diabetes education pending Tags: #NewProblem #RequiresFollowUp #PatientEducation     Seasonal allergies    Sleep apnea    wears cpap    Thyroid disease    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 Fleeta Smock    Past Surgical History:  Procedure Laterality Date   ABLATION  10/2019   ADENOIDECTOMY     COLONOSCOPY     DENTAL SURGERY     graft of  mouth    Family History  Problem Relation Age of Onset   Heart disease Father    Hypertension Mother    Hyperlipidemia Mother    Breast cancer Maternal Grandmother    Prostate cancer Maternal Grandfather    Diabetes Maternal Grandfather    CAD Maternal Grandfather    Colon cancer Neg Hx    Colon polyps Neg Hx    Esophageal  cancer Neg Hx    Stomach cancer Neg Hx    Rectal cancer Neg Hx    Social History:  reports that she has never smoked. She has never used smokeless tobacco. She reports that she does not drink alcohol and does not use drugs.  Allergies:  Allergies  Allergen Reactions   Atorvastatin Hives   Other Shortness Of Breath   Medications: I have reviewed the patient's current medications. Prior to Admission:  Medications Prior to Admission  Medication Sig Dispense Refill Last Dose/Taking   albuterol  (VENTOLIN  HFA) 108 (90 Base) MCG/ACT inhaler 1 PUFF AS NEEDED EVERY 4 HOURS FOR COUGH/WHEEZE INHALATION 90 DAYS   Past Month   Albuterol -Budesonide (AIRSUPRA ) 90-80 MCG/ACT AERO Inhale 2 Inhalations into the lungs every 4 (four) hours as needed. Replaces albuterol , rescue inhaler 10.7 g 11 Past Month   Ascorbic Acid (VITAMIN C) 1000 MG tablet Take 1,000 mg by mouth daily.   03/23/2023   Azelastine -Fluticasone  137-50 MCG/ACT SUSP PLACE 1 SPRAY INTO THE NOSE EVERY 12 (TWELVE) HOURS. 23 g 1 03/23/2023   Cholecalciferol (D3-1000 PO) Take 1,000 Units by mouth daily.   03/23/2023   Continuous Glucose Sensor (DEXCOM G7 SENSOR) MISC 1 Act by Does not apply route daily. 9 each 1 Taking   Cyanocobalamin (VITAMIN B12 PO) Take by mouth daily.   03/23/2023   escitalopram  (LEXAPRO ) 20 MG tablet TAKE 1 TABLET BY MOUTH EVERY DAY 90 tablet 3 03/23/2023   ibuprofen (ADVIL) 600 MG tablet Take 600 mg by mouth every 8 (eight) hours as needed for cramping, moderate pain, mild pain, headache or fever.   Taking As Needed   insulin  glargine (LANTUS ) 100 UNIT/ML Solostar Pen Inject 20 Units into the skin daily. 15 mL 0 03/23/2023   levocetirizine (XYZAL ) 5 MG tablet Take 1 tablet (5 mg total) by mouth every evening. 30 tablet 2 03/23/2023   levothyroxine  (SYNTHROID ) 75 MCG tablet Take 1 tablet (75 mcg total) by mouth daily. 90 tablet 3 03/23/2023   losartan -hydrochlorothiazide  (HYZAAR) 50-12.5 MG tablet TAKE 1 TABLET BY MOUTH DAILY. OK TO START  WITH JUST HALF TABLET DAILY FOR WEEK 1 AND LEAVE IT THERE IF GOAL OF 140/90 REACHED. (Patient taking differently: Take 1 tablet by mouth daily.) 90 tablet 4 03/23/2023   metFORMIN  (GLUCOPHAGE ) 500 MG tablet Take 2 tablets (1,000 mg total) by mouth 2 (two) times daily with a meal. Replaces 500 mg twice daily dosing for better sugar control 360 tablet 3 03/23/2023   montelukast  (SINGULAIR ) 10 MG tablet Take 1 tablet by mouth every evening.  5 03/23/2023   rosuvastatin  (CRESTOR ) 20 MG tablet TAKE 1 TABLET BY MOUTH EVERY DAY 90 tablet 3 03/23/2023   Saline (SIMPLY SALINE) 0.9 % AERS Place 2 each into the nose as directed. Use nightly for sinus hygiene long-term.  Can also be used as many times daily as desired to assist with clearing congested sinuses. 127 mL 11 Past Week   tirzepatide  (MOUNJARO ) 5 MG/0.5ML Pen Inject 5 mg into the skin once a week. Replaces Wegovy .  Move straight to this from Wegovy   0.5 mg 6 mL 2 03/23/2023   topiramate  (TOPAMAX ) 25 MG tablet TAKE 1 TABLET (25 MG TOTAL) BY MOUTH 2 (TWO) TIMES DAILY. TAKE JUST ONE TABLET DAILY FOR THE FIRST WEEK. 180 tablet 4 03/23/2023   Zinc 50 MG TABS Take by mouth daily.   03/23/2023   Blood Glucose Monitoring Suppl DEVI 1 each by Does not apply route in the morning, at noon, and at bedtime. May substitute to any manufacturer covered by patient's insurance. 1 each 0    Glucose Blood (BLOOD GLUCOSE TEST STRIPS) STRP 1 each by In Vitro route in the morning, at noon, and at bedtime. May substitute to any manufacturer covered by patient's insurance. 100 strip 0    insulin  aspart (NOVOLOG  FLEXPEN) 100 UNIT/ML FlexPen 0-9 Units, Subcutaneous, 3 times daily with meals CBG < 70: Implement Hypoglycemia measures CBG 70 - 120: 0 units CBG 121 - 150: 1 unit CBG 151 - 200: 2 units CBG 201 - 250: 3 units CBG 251 - 300: 5 units CBG 301 - 350: 7 units CBG 351 - 400: 9 units CBG > 400: call MD (Patient not taking: Reported on 03/16/2023) 15 mL 0 Not Taking   Insulin  Pen Needle 32G X 4  MM MISC 1 each 4 (four) times daily. 100 each 0    Lancet Device MISC 1 each by Does not apply route in the morning, at noon, and at bedtime. May substitute to any manufacturer covered by patient's insurance. 1 each 0    Lancets Misc. MISC 1 each by Does not apply route in the morning, at noon, and at bedtime. May substitute to any manufacturer covered by patient's insurance. 100 each 0    Scheduled:  enoxaparin  (LOVENOX ) injection  40 mg Subcutaneous Q24H   insulin  aspart  0-15 Units Subcutaneous Q4H   levothyroxine   75 mcg Oral Q0600   losartan   50 mg Oral Daily   montelukast   10 mg Oral QPM   rosuvastatin   20 mg Oral Daily   sodium chloride  flush  3 mL Intravenous Q12H   Continuous:  sodium chloride  125 mL/hr at 03/25/23 1352   PRN:acetaminophen  **OR** acetaminophen , albuterol , HYDROmorphone  (DILAUDID ) injection, ondansetron  **OR** ondansetron  (ZOFRAN ) IV, polyethylene glycol  Results for orders placed or performed during the hospital encounter of 03/24/23 (from the past 48 hours)  Basic metabolic panel     Status: Abnormal   Collection Time: 03/24/23 12:38 AM  Result Value Ref Range   Sodium 136 135 - 145 mmol/L   Potassium 3.3 (L) 3.5 - 5.1 mmol/L   Chloride 101 98 - 111 mmol/L   CO2 19 (L) 22 - 32 mmol/L   Glucose, Bld 207 (H) 70 - 99 mg/dL    Comment: Glucose reference range applies only to samples taken after fasting for at least 8 hours.   BUN 9 6 - 20 mg/dL   Creatinine, Ser 9.27 0.44 - 1.00 mg/dL   Calcium  10.1 8.9 - 10.3 mg/dL   GFR, Estimated >39 >39 mL/min    Comment: (NOTE) Calculated using the CKD-EPI Creatinine Equation (2021)    Anion gap 16 (H) 5 - 15    Comment: Performed at [facility], [address] [place], [place] [address]  CBC     Status: None   Collection Time: 03/24/23 12:38 AM  Result Value Ref Range   WBC 9.4 4.0 - 10.5 K/uL   RBC 4.36 3.87 - 5.11 MIL/uL   Hemoglobin 13.9 12.0 - 15.0 g/dL  HCT 39.7 36.0 - 46.0 %   MCV 91.1 80.0 -  100.0 fL   MCH 31.9 26.0 - 34.0 pg   MCHC 35.0 30.0 - 36.0 g/dL   RDW 86.7 88.4 - 84.4 %   Platelets 229 150 - 400 K/uL   nRBC 0.0 0.0 - 0.2 %    Comment: Performed at [facility], [address] [place], [place] [address]  Troponin I (High Sensitivity)     Status: None   Collection Time: 03/24/23 12:38 AM  Result Value Ref Range   Troponin I (High Sensitivity) 8 <18 ng/L    Comment: (NOTE) Elevated high sensitivity troponin I (hsTnI) values and significant  changes across serial measurements may suggest ACS but many other  chronic and acute conditions are known to elevate hsTnI results.  Refer to the Links section for chest pain algorithms and additional  guidance. Performed at [facility], [address] [place], [place] [address]   Troponin I (High Sensitivity)     Status: None   Collection Time: 03/24/23  2:47 AM  Result Value Ref Range   Troponin I (High Sensitivity) 6 <18 ng/L    Comment: (NOTE) Elevated high sensitivity troponin I (hsTnI) values and significant  changes across serial measurements may suggest ACS but many other  chronic and acute conditions are known to elevate hsTnI results.  Refer to the Links section for chest pain algorithms and additional  guidance. Performed at [facility], [address] [place], [place] [address]   CBG monitoring, ED     Status: Abnormal   Collection Time: 03/24/23  8:57 AM  Result Value Ref Range   Glucose-Capillary 152 (H) 70 - 99 mg/dL    Comment: Glucose reference range applies only to samples taken after fasting for at least 8 hours.  Hepatic function panel     Status: None   Collection Time: 03/24/23 11:27 AM  Result Value Ref Range   Total Protein 7.5 6.5 - 8.1 g/dL   Albumin 4.2 3.5 - 5.0 g/dL   AST 34 15 - 41 U/L   ALT 29 0 - 44 U/L   Alkaline Phosphatase 81 38 - 126 U/L   Total Bilirubin 1.0 0.0 - 1.2 mg/dL   Bilirubin, Direct 0.1 0.0 - 0.2 mg/dL   Indirect Bilirubin 0.9 0.3 - 0.9  mg/dL    Comment: Performed at [facility], [address] [place], [place] [address]  Lipase, blood     Status: Abnormal   Collection Time: 03/24/23 11:27 AM  Result Value Ref Range   Lipase 1,232 (H) 11 - 51 U/L    Comment: RESULTS CONFIRMED BY MANUAL DILUTION Performed at [facility], [address], [place], [place] [address]   CBG monitoring, ED     Status: Abnormal   Collection Time: 03/24/23  3:40 PM  Result Value Ref Range   Glucose-Capillary 125 (H) 70 - 99 mg/dL    Comment: Glucose reference range applies only to samples taken after fasting for at least 8 hours.  Glucose, capillary     Status: Abnormal   Collection Time: 03/24/23  8:37 PM  Result Value Ref Range   Glucose-Capillary 126 (H) 70 - 99 mg/dL    Comment: Glucose reference range applies only to samples taken after fasting for at least 8 hours.  Glucose, capillary     Status: Abnormal   Collection Time: 03/25/23 12:34 AM  Result Value Ref Range  Glucose-Capillary 110 (H) 70 - 99 mg/dL    Comment: Glucose reference range applies only to samples taken after fasting for at least 8 hours.  Glucose, capillary     Status: Abnormal   Collection Time: 03/25/23  4:37 AM  Result Value Ref Range   Glucose-Capillary 130 (H) 70 - 99 mg/dL    Comment: Glucose reference range applies only to samples taken after fasting for at least 8 hours.  Comprehensive metabolic panel     Status: Abnormal   Collection Time: 03/25/23  6:44 AM  Result Value Ref Range   Sodium 139 135 - 145 mmol/L   Potassium 3.2 (L) 3.5 - 5.1 mmol/L   Chloride 105 98 - 111 mmol/L   CO2 24 22 - 32 mmol/L   Glucose, Bld 126 (H) 70 - 99 mg/dL    Comment: Glucose reference range applies only to samples taken after fasting for at least 8 hours.   BUN 5 (L) 6 - 20 mg/dL   Creatinine, Ser 9.41 0.44 - 1.00 mg/dL   Calcium  8.9 8.9 - 10.3 mg/dL   Total Protein 6.7 6.5 - 8.1 g/dL   Albumin 3.8 3.5 - 5.0 g/dL   AST 24 15 - 41 U/L   ALT 22 0 - 44  U/L   Alkaline Phosphatase 71 38 - 126 U/L   Total Bilirubin 1.1 0.0 - 1.2 mg/dL   GFR, Estimated >39 >39 mL/min    Comment: (NOTE) Calculated using the CKD-EPI Creatinine Equation (2021)    Anion gap 10 5 - 15    Comment: Performed at [facility], [address] [place], [place] [address]  CBC     Status: None   Collection Time: 03/25/23  6:44 AM  Result Value Ref Range   WBC 7.9 4.0 - 10.5 K/uL   RBC 4.25 3.87 - 5.11 MIL/uL   Hemoglobin 13.5 12.0 - 15.0 g/dL   HCT 61.6 63.9 - 53.9 %   MCV 90.1 80.0 - 100.0 fL   MCH 31.8 26.0 - 34.0 pg   MCHC 35.2 30.0 - 36.0 g/dL   RDW 86.3 88.4 - 84.4 %   Platelets 181 150 - 400 K/uL   nRBC 0.0 0.0 - 0.2 %    Comment: Performed at [facility], [address] [place], [place] [address]  Lipase, blood     Status: Abnormal   Collection Time: 03/25/23  6:44 AM  Result Value Ref Range   Lipase 262 (H) 11 - 51 U/L    Comment: Performed at [facility], [address], [place], [place] [address]  Glucose, capillary     Status: Abnormal   Collection Time: 03/25/23  9:41 AM  Result Value Ref Range   Glucose-Capillary 128 (H) 70 - 99 mg/dL    Comment: Glucose reference range applies only to samples taken after fasting for at least 8 hours.    CT ABDOMEN PELVIS W CONTRAST Result Date: 03/24/2023 CLINICAL DATA:  Abdominal pain, acute, nonlocalized elevated lipase. * Tracking Code: BO * EXAM: CT ABDOMEN AND PELVIS WITH CONTRAST TECHNIQUE: Multidetector CT imaging of the abdomen and pelvis was performed using the standard protocol following bolus administration of intravenous contrast. RADIATION DOSE REDUCTION: This exam was performed according to the departmental dose-optimization program which includes automated exposure control, adjustment of the mA and/or kV according to patient size and/or use of iterative reconstruction technique. CONTRAST:  75mL OMNIPAQUE  IOHEXOL  350 MG/ML SOLN COMPARISON:  CT scan abdomen  and pelvis from  06/11/2021. FINDINGS: Lower chest: There are patchy atelectatic changes in the visualized lung bases. No overt consolidation. No pleural effusion. The heart is normal in size. No pericardial effusion. 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 Hepatobiliary: The liver is normal in size. Non-cirrhotic configuration. There are at least 10-12, ill-defined, hypoattenuating target like liver lesions with largest in the right hepatic lobe, segment 7 measuring up to 3.3 x 4.9 cm, compatible with metastases. No intrahepatic or extrahepatic bile duct dilation. No calcified gallstones. Normal gallbladder wall thickness. No pericholecystic inflammatory changes. Pancreas: There is heterogeneous, hypoattenuating mass centered in the pancreatic tail measuring up to 3.0 x 4.6 cm, highly concerning for pancreatic adenocarcinoma. 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 There is also fat stranding surrounding the pancreatic head/uncinate process and duodenum. The epicenter of the fat stranding appears to be centered more around duodenum and pancreas may be secondarily involved. Correlate clinically. Otherwise unremarkable pancreas. Main pancreatic duct is not dilated. Spleen: Within normal limits. No focal lesion. Adrenals/Urinary Tract: Adrenal glands are unremarkable. No suspicious renal mass. No hydronephrosis. No renal or ureteric calculi. Unremarkable urinary bladder. Stomach/Bowel: No disproportionate dilation of the small or large bowel loops. The appendix is unremarkable. There is mild circumferential and irregular thickening of the wall of second and third part of duodenum with surrounding fat stranding, concerning for duodenitis. No discrete  duodenal ulcer seen. No pneumoperitoneum. There are multiple diverticula mainly in the left hemi colon, without imaging signs of diverticulitis. Vascular/Lymphatic: No ascites or pneumoperitoneum. No abdominal or pelvic lymphadenopathy, by size criteria. No aneurysmal dilation of the major abdominal arteries. There are mild peripheral atherosclerotic vascular calcifications of the aorta and its major branches. Reproductive: The uterus is unremarkable. No large adnexal mass. Other: There is a tiny fat containing umbilical hernia. The soft tissues and abdominal wall are otherwise unremarkable. Musculoskeletal: No suspicious osseous lesions. There are mild multilevel degenerative changes in the visualized spine. IMPRESSION: 1. 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 2. There is fat stranding predominantly surrounding the second and third parts of duodenum and also extending around the pancreatic head/uncinate process. Findings favor duodenitis with secondary involvement of pancreatic head/uncinate process. Correlate clinically. 3. Multiple other nonacute observations, as described above. Electronically Signed   By: Ree Molt M.D.   On: 03/24/2023 16:01   DG Chest 2 View Result Date: 03/24/2023 CLINICAL DATA:  Chest pain EXAM: CHEST - 2 VIEW COMPARISON:  03/01/2023 FINDINGS: The heart size and mediastinal contours are within normal limits. Both lungs are clear. The visualized skeletal structures are unremarkable. IMPRESSION: No active cardiopulmonary disease. Electronically Signed   By: Oneil Devonshire M.D.   On: 03/24/2023 00:56    Review of Systems  Constitutional:  Positive for appetite change and unexpected weight change. Negative for activity change, chills, diaphoresis, fatigue and fever.  HENT: Negative.    Eyes: Negative.   Respiratory: Negative.    Cardiovascular: Negative.   Gastrointestinal:   Positive for abdominal pain, diarrhea, nausea and vomiting. Negative for anal bleeding, blood in stool, constipation and rectal pain.  Endocrine: Negative.   Genitourinary: Negative.   Musculoskeletal:  Positive for arthralgias.  Allergic/Immunologic: Negative.   Neurological: Negative.   Hematological: Negative.   Psychiatric/Behavioral: Negative.     Blood pressure (!) 149/98, pulse 80, temperature 98.7 F (37.1 C), resp. rate 18, height 5' 4 (1.626 m), weight 107.4 kg, SpO2 95%. Physical Exam Constitutional:      General: She is not in acute distress.    Appearance: She is obese.  HENT:     Head: Normocephalic and atraumatic.     Mouth/Throat:     Mouth: Mucous membranes are moist.  Eyes:     Extraocular Movements: Extraocular movements intact.     Pupils: Pupils are equal, round, and reactive to light.  Cardiovascular:     Rate and Rhythm: Normal rate and regular rhythm.  Pulmonary:     Effort: Pulmonary effort is normal.     Breath sounds: Normal breath sounds.  Abdominal:     General: There is no distension.     Palpations: There is no mass.     Tenderness: There is abdominal tenderness. There is no guarding.  Musculoskeletal:     Cervical back: Normal range of motion and neck supple.  Skin:    General: Skin is warm and dry.  Neurological:     General: No focal deficit present.     Mental Status: She is oriented to person, place, and time.   Assessment/Plan: 1) Acute pancreatitis with abnormal CT scan of the abdomen pelvis with a 3.0 x 4.6 mass in the tail of the pancreas with invasion of the splenic vein and multiple liver lesions suspicious for primary adenocarcinoma of the pancreas-patient will need tissue diagnosis. Have discussed the possibilities with her and her family her husband and her mother by the bedside.  All their questions have been answered. 2) Hypertension/hyperlipidemia. 3) Hypothyroidism. 4) AODM. 5) Asthma. 6) Morbid obesity/sleep  apnea.  Renaye Sous 03/25/2023, 12:48 PM

## 2023-03-25 NOTE — Progress Notes (Signed)
 PROGRESS NOTE  Debbie [name]  FMW:[id number] DOB: 01-21-1971 DOA: 03/24/2023 PCP: Jesus Bernardino MATSU, MD   Brief Narrative: Patient is a 53 year old female with history of hypertension, hyperlipidemia, hypothyroidism, diabetes, obesity who presented with nausea, vomiting, abdominal pain.  On presentation, she was hypertensive.  Lab work showed potassium of 3.3, lipase of 232.  Patient admitted for the management of acute meningitis.  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  GI also consulted.  Assessment & Plan:  Principal Problem:   Acute pancreatitis Active Problems:   Essential hypertension   Hyperlipidemia   Hypothyroidism   Morbid obesity (HCC)   Type 2 diabetes mellitus (HCC)   Asthma, chronic   Acute pancreatitis: Presented with nausea, vomiting, abdominal pain, elevated lipase.  No history of alcohol abuse.  Patient most likely secondary to pancreatic mass which was incidentally seen on the CT scan.  Currently NPO.  Continue IV fluid.  Will advance the diet gradually.  Started on clear liquid diet today .Abd pain is better.  No nausea or vomiting.  Still having some abdominal discomfort.  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  Might need IR guided pancreatic biopsy.  Will follow up with  GI.  No history of malignancy.  Will check CA 19-9.  Will need to involve oncology at some point.  Hypokalemia: Supplemented with potassium  Hypertension: Hypertensive on presentation.  Blood pressure better now.  Continue losartan   Hyperlipidemia: Liver enzymes normal.  Continue statin  Hypothyroidism: Continue Synthyroid  Diabetes type 2: Recently diagnosed.  .Currently on sliding scale.   Monitor blood sugars  Asthma: Currently not on exacerbation.  Continue bronchodilators  as needed  Morbid obesity: BMI 40.6        DVT prophylaxis:enoxaparin  (LOVENOX ) injection 40 mg Start: 03/24/23 1430     Code Status: Full Code  Family Communication: Husband at bedside  Patient status:Obs  Patient is from :Home  Anticipated discharge un:ynfz  Estimated DC date:after full work up   Consultants: GI  Procedures:None yet  Antimicrobials:  Anti-infectives (From admission, onward)    None       Subjective: Patient seen and examined at the bedside today.  She feels better.  No nausea or vomiting this morning.  Has some generalized abdominal soreness.  Abdomen is soft, nondistended but has mild generalized tenderness.  Has good bowel sounds.  Want to try liquid diet.  We discussed about possibility of underlying pancreatic malignancy.  Long discussion held at bedside with patient and her husband  Objective: Vitals:   03/24/23 1958 03/25/23 0435 03/25/23 0500 03/25/23 0801  BP: (!) 152/87 (!) 155/87  (!) 149/98  Pulse: 91 84  80  Resp:    18  Temp: 99 F (37.2 C) 98.9 F (37.2 C)  98.7 F (37.1 C)  TempSrc:      SpO2: 96% 95%  95%  Weight:   107.4 kg   Height:        Intake/Output Summary (Last 24 hours) at 03/25/2023 1101 Last data filed at 03/24/2023 2202 Gross per 24 hour  Intake 1000 ml  Output --  Net 1000 ml   Filed Weights   03/24/23 0031 03/25/23 0500  Weight: 105.6 kg 107.4 kg    Examination:  General exam: Overall comfortable, not in distress,obese HEENT: PERRL Respiratory system:  no wheezes or crackles  Cardiovascular system: S1 & S2 heard, RRR.  Gastrointestinal system: Abdomen is mildly distended, soft and bowel sounds were heard.  Mild generalized tenderness. Central nervous system: Alert and oriented Extremities: No edema, no clubbing ,no cyanosis Skin: No rashes, no ulcers,no icterus     Data Reviewed: I have personally  reviewed following labs and imaging studies  CBC: Recent Labs  Lab 03/24/23 0038 03/25/23 0644  WBC 9.4 7.9  HGB 13.9 13.5  HCT 39.7 38.3  MCV 91.1 90.1  PLT 229 181   Basic Metabolic Panel: Recent Labs  Lab 03/24/23 0038 03/25/23 0644  NA 136 139  K 3.3* 3.2*  CL 101 105  CO2 19* 24  GLUCOSE 207* 126*  BUN 9 5*  CREATININE 0.72 0.58  CALCIUM  10.1 8.9     No results found for this or any previous visit (from the past 240 hours).   Radiology Studies: CT ABDOMEN PELVIS W CONTRAST Result Date: 03/24/2023 CLINICAL DATA:  Abdominal pain, acute, nonlocalized elevated lipase. * Tracking Code: BO * EXAM: CT ABDOMEN AND PELVIS WITH CONTRAST TECHNIQUE: Multidetector CT imaging of the abdomen and pelvis was performed using the standard protocol following bolus administration of intravenous contrast. RADIATION DOSE REDUCTION: This exam was performed according to the departmental dose-optimization program which includes automated exposure control, adjustment of the mA and/or kV according to patient size and/or use of iterative reconstruction technique. CONTRAST:  75mL OMNIPAQUE  IOHEXOL  350 MG/ML SOLN COMPARISON:  CT scan abdomen and pelvis from 06/11/2021. FINDINGS: Lower chest: There are patchy atelectatic changes in the visualized lung bases. No overt consolidation. No pleural effusion. The heart is normal in size. No pericardial effusion. 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 Hepatobiliary: The liver is normal in size. Non-cirrhotic configuration. There are at least 10-12, ill-defined, hypoattenuating target like liver lesions with largest in the right hepatic lobe, segment 7 measuring up to 3.3 x 4.9 cm, compatible with metastases. No intrahepatic or extrahepatic bile duct dilation. No calcified gallstones. Normal gallbladder wall thickness. No pericholecystic inflammatory changes.  Pancreas: There is heterogeneous, hypoattenuating mass centered in the pancreatic tail measuring up to 3.0 x 4.6 cm, highly concerning for pancreatic adenocarcinoma. 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 There is also fat stranding surrounding the pancreatic head/uncinate process and duodenum. The epicenter of the fat stranding appears to be centered more around duodenum and pancreas may be secondarily involved. Correlate clinically. Otherwise unremarkable pancreas. Main pancreatic duct is not dilated. Spleen: Within normal limits. No focal lesion. Adrenals/Urinary Tract: Adrenal glands are unremarkable. No suspicious renal mass. No hydronephrosis. No renal or ureteric calculi. Unremarkable urinary bladder. Stomach/Bowel: No disproportionate dilation of the small or large bowel loops. The appendix is unremarkable. There is mild circumferential and irregular thickening of the wall of second and third part of duodenum with surrounding fat stranding, concerning for duodenitis. No discrete duodenal ulcer seen. No pneumoperitoneum. There are multiple diverticula mainly in the left hemi colon, without imaging signs of diverticulitis. Vascular/Lymphatic: No ascites or pneumoperitoneum. No abdominal or pelvic lymphadenopathy, by size criteria. No aneurysmal dilation of the major abdominal arteries. There are mild peripheral atherosclerotic vascular calcifications of  the aorta and its major branches. Reproductive: The uterus is unremarkable. No large adnexal mass. Other: There is a tiny fat containing umbilical hernia. The soft tissues and abdominal wall are otherwise unremarkable. Musculoskeletal: No suspicious osseous lesions. There are mild multilevel degenerative changes in the visualized spine. IMPRESSION: 1. 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 2. There is fat stranding predominantly surrounding the second and third parts of duodenum and also extending around the pancreatic head/uncinate process. Findings favor duodenitis with secondary involvement of pancreatic head/uncinate process. Correlate clinically. 3. Multiple other nonacute observations, as described above. Electronically Signed   By: Ree Molt M.D.   On: 03/24/2023 16:01   DG Chest 2 View Result Date: 03/24/2023 CLINICAL DATA:  Chest pain EXAM: CHEST - 2 VIEW COMPARISON:  03/01/2023 FINDINGS: The heart size and mediastinal contours are within normal limits. Both lungs are clear. The visualized skeletal structures are unremarkable. IMPRESSION: No active cardiopulmonary disease. Electronically Signed   By: Oneil Devonshire M.D.   On: 03/24/2023 00:56    Scheduled Meds:  enoxaparin  (LOVENOX ) injection  40 mg Subcutaneous Q24H   losartan   50 mg Oral Daily   And   hydrochlorothiazide   12.5 mg Oral Daily   insulin  aspart  0-15 Units Subcutaneous Q4H   levothyroxine   75 mcg Oral Q0600   montelukast   10 mg Oral QPM   rosuvastatin   20 mg Oral Daily   sodium chloride  flush  3 mL Intravenous Q12H   Continuous Infusions:   LOS: 0 days   Ivonne Mustache, MD [facility] P1/06/2023, 11:01 AM

## 2023-03-26 DIAGNOSIS — K8500 Idiopathic acute pancreatitis without necrosis or infection: Secondary | ICD-10-CM | POA: Diagnosis not present

## 2023-03-26 DIAGNOSIS — C252 Malignant neoplasm of tail of pancreas: Secondary | ICD-10-CM | POA: Diagnosis not present

## 2023-03-26 LAB — GLUCOSE, CAPILLARY
Glucose-Capillary: 100 mg/dL — ABNORMAL HIGH (ref 70–99)
Glucose-Capillary: 108 mg/dL — ABNORMAL HIGH (ref 70–99)
Glucose-Capillary: 114 mg/dL — ABNORMAL HIGH (ref 70–99)
Glucose-Capillary: 131 mg/dL — ABNORMAL HIGH (ref 70–99)
Glucose-Capillary: 92 mg/dL (ref 70–99)
Glucose-Capillary: 96 mg/dL (ref 70–99)

## 2023-03-26 LAB — BASIC METABOLIC PANEL
Anion gap: 11 (ref 5–15)
BUN: <5 mg/dL — ABNORMAL LOW (ref 6–20)
CO2: 22 mmol/L (ref 22–32)
Calcium: 8.6 mg/dL — ABNORMAL LOW (ref 8.9–10.3)
Chloride: 106 mmol/L (ref 98–111)
Creatinine, Ser: 0.59 mg/dL (ref 0.44–1.00)
GFR, Estimated: >60 mL/min (ref >60)
Glucose, Bld: 136 mg/dL — ABNORMAL HIGH (ref 70–99)
Potassium: 3.1 mmol/L — ABNORMAL LOW (ref 3.5–5.1)
Sodium: 139 mmol/L (ref 135–145)

## 2023-03-26 MED ORDER — POTASSIUM CHLORIDE CRYS ER 20 MEQ PO TBCR
40.0000 meq | EXTENDED_RELEASE_TABLET | Freq: Two times a day (BID) | ORAL | Status: AC
  Administered 2023-03-26 (×2): 40 meq via ORAL
  Filled 2023-03-26 (×2): qty 2

## 2023-03-26 NOTE — Progress Notes (Signed)
 PROGRESS NOTE  Debbie [name]  FMW:[id number] DOB: June 03, 1970 DOA: 03/24/2023 PCP: Jesus Bernardino MATSU, MD   Brief Narrative: Patient is a 53 year old female with history of hypertension, hyperlipidemia, hypothyroidism, diabetes, obesity who presented with nausea, vomiting, abdominal pain.  On presentation, she was hypertensive.  Lab work showed potassium of 3.3, lipase of 1232.  Patient admitted for the management of acute pancreatitis.  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  GI also consulted.  Assessment & Plan:  Principal Problem:   Acute pancreatitis Active Problems:   Essential hypertension   Hyperlipidemia   Hypothyroidism   Morbid obesity (HCC)   Type 2 diabetes mellitus (HCC)   Asthma, chronic   Acute pancreatitis: Presented with nausea, vomiting, abdominal pain, elevated lipase.  No history of alcohol abuse.  Patient most likely secondary to pancreatic mass which was incidentally seen on the CT scan.   Clinically improving.  Lipase downtrending.  Currently tolerating clear liquid diet.  Adequate pain medications.  Continue IV fluids.  Recheck electrolytes and lipase tomorrow morning.  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  Discussed with GI.  Possible EUS/biopsy tomorrow.   Hypokalemia: Persistent.  Replace.  Hypertension: Hypertensive on presentation.  Blood pressure better now.  Continue losartan   Hyperlipidemia: Liver enzymes normal.  Continue statin  Hypothyroidism: Continue Synthyroid  Diabetes type 2: Recently diagnosed.  .Currently on sliding scale.  Monitor blood sugars  Asthma: Currently not on exacerbation.  Continue bronchodilators  as needed  Morbid obesity: BMI  40.6        DVT prophylaxis:enoxaparin  (LOVENOX ) injection 40 mg Start: 03/24/23 1430     Code Status: Full Code  Family Communication: None at the bedside.  Patient status: Inpatient.  IV fluid.,  Inpatient procedures  Patient is from :Home  Anticipated discharge un:ynfz  Estimated DC date:after full work up   Consultants: GI  Procedures:None yet  Antimicrobials:  Anti-infectives (From admission, onward)    None       Subjective:  Patient was seen and examined.  No overnight events.  Denies any nausea vomiting.  Tolerating clears.  She has occasional epigastric area pain but moderate in intensity.  Objective: Vitals:   03/25/23 2007 03/26/23 0406 03/26/23 0500 03/26/23 0808  BP: (!) 140/81 139/81  139/87  Pulse: 83 71  74  Resp:    18  Temp: 99.2 F (37.3 C) 98.4 F (36.9 C)  98.1 F (36.7 C)  TempSrc:    Oral  SpO2: 97% 96%  98%  Weight:   106.9 kg   Height:        Intake/Output Summary (Last 24 hours) at 03/26/2023 1129 Last data filed at 03/26/2023 9391 Gross per 24 hour  Intake 183 ml  Output --  Net 183 ml   Filed Weights   03/24/23 0031 03/25/23 0500 03/26/23 0500  Weight: 105.6 kg 107.4 kg 106.9 kg    Examination:  General: Fairly comfortable.  Appropriately anxious.  On room air. Cardiovascular: S1-S2 normal.  Regular rate rhythm. Respiratory: Bilateral clear.  No added sounds. Gastrointestinal: Soft.  Nontender.  Bowel sounds present. Ext: No swelling or edema. Neuro: Alert awake and oriented.  No focal neurological deficits.    Data Reviewed: I have personally reviewed following labs and imaging  studies  CBC: Recent Labs  Lab 03/24/23 0038 03/25/23 0644  WBC 9.4 7.9  HGB 13.9 13.5  HCT 39.7 38.3  MCV 91.1 90.1  PLT 229 181   Basic Metabolic Panel: Recent Labs  Lab 03/24/23 0038 03/25/23 0644 03/26/23 0658  NA 136 139 139  K 3.3* 3.2* 3.1*  CL 101 105 106  CO2 19* 24 22  GLUCOSE 207* 126* 136*  BUN 9 5* <5*   CREATININE 0.72 0.58 0.59  CALCIUM  10.1 8.9 8.6*     No results found for this or any previous visit (from the past 240 hours).   Radiology Studies: CT ABDOMEN PELVIS W CONTRAST Result Date: 03/24/2023 CLINICAL DATA:  Abdominal pain, acute, nonlocalized elevated lipase. * Tracking Code: BO * EXAM: CT ABDOMEN AND PELVIS WITH CONTRAST TECHNIQUE: Multidetector CT imaging of the abdomen and pelvis was performed using the standard protocol following bolus administration of intravenous contrast. RADIATION DOSE REDUCTION: This exam was performed according to the departmental dose-optimization program which includes automated exposure control, adjustment of the mA and/or kV according to patient size and/or use of iterative reconstruction technique. CONTRAST:  75mL OMNIPAQUE  IOHEXOL  350 MG/ML SOLN COMPARISON:  CT scan abdomen and pelvis from 06/11/2021. FINDINGS: Lower chest: There are patchy atelectatic changes in the visualized lung bases. No overt consolidation. No pleural effusion. The heart is normal in size. No pericardial effusion. 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 Hepatobiliary: The liver is normal in size. Non-cirrhotic configuration. There are at least 10-12, ill-defined, hypoattenuating target like liver lesions with largest in the right hepatic lobe, segment 7 measuring up to 3.3 x 4.9 cm, compatible with metastases. No intrahepatic or extrahepatic bile duct dilation. No calcified gallstones. Normal gallbladder wall thickness. No pericholecystic inflammatory changes. Pancreas: There is heterogeneous, hypoattenuating mass centered in the pancreatic tail measuring up to 3.0 x 4.6 cm, highly concerning for pancreatic adenocarcinoma. There is extension of the tumor into the 1 of the splenic artery branch (series 6, image 57), which is expanded with heterogeneous tumor there is complete  occlusion of the splenic vein posterior to the pancreas. There is also fat stranding surrounding the pancreatic head/uncinate process and duodenum. The epicenter of the fat stranding appears to be centered more around duodenum and pancreas may be secondarily involved. Correlate clinically. Otherwise unremarkable pancreas. Main pancreatic duct is not dilated. Spleen: Within normal limits. No focal lesion. Adrenals/Urinary Tract: Adrenal glands are unremarkable. No suspicious renal mass. No hydronephrosis. No renal or ureteric calculi. Unremarkable urinary bladder. Stomach/Bowel: No disproportionate dilation of the small or large bowel loops. The appendix is unremarkable. There is mild circumferential and irregular thickening of the wall of second and third part of duodenum with surrounding fat stranding, concerning for duodenitis. No discrete duodenal ulcer seen. No pneumoperitoneum. There are multiple diverticula mainly in the left hemi colon, without imaging signs of diverticulitis. Vascular/Lymphatic: No ascites or pneumoperitoneum. No abdominal or pelvic lymphadenopathy, by size criteria. No aneurysmal dilation of the major abdominal arteries. There are mild peripheral atherosclerotic vascular calcifications of the aorta and its major branches. Reproductive: The uterus is unremarkable. No large adnexal mass. Other: There is a tiny fat containing umbilical hernia. The soft tissues and abdominal wall are otherwise unremarkable. Musculoskeletal: No suspicious osseous lesions. There are mild multilevel degenerative changes in the visualized spine. IMPRESSION: 1. 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 2. There is fat stranding predominantly surrounding the second and third parts of duodenum and also extending around the pancreatic head/uncinate process. Findings favor duodenitis with secondary involvement of  pancreatic head/uncinate process. Correlate clinically. 3. Multiple other nonacute observations, as described above. Electronically Signed   By: Ree Molt M.D.   On: 03/24/2023 16:01    Scheduled Meds:  enoxaparin  (LOVENOX ) injection  40 mg Subcutaneous Q24H   insulin  aspart  0-15 Units Subcutaneous Q4H   levothyroxine   75 mcg Oral Q0600   losartan   50 mg Oral Daily   montelukast   10 mg Oral QPM   potassium chloride   40 mEq Oral BID   rosuvastatin   20 mg Oral Daily   sodium chloride  flush  3 mL Intravenous Q12H   Continuous Infusions:  sodium chloride  125 mL/hr at 03/25/23 2358     LOS: 1 day   Renato Applebaum, MD [facility] P1/07/2023, 11:29 AM

## 2023-03-26 NOTE — Progress Notes (Addendum)
 [facility]OSS [facility] Subjective: Since I last evaluated the patient, she seems to be doing somewhat better.  Her abdominal pain is improved and she describes it as a soreness. She is awaiting EUS FNA for tissue diagnosis with regards to the mass in the pancreatic tail. Objective: Vital signs in last 24 hours: Temp:  [98.4 F (36.9 C)-99.2 F (37.3 C)] 98.4 F (36.9 C) (01/05 0406) Pulse Rate:  [71-86] 71 (01/05 0406) Resp:  [16-18] 16 (01/04 1607) BP: (139-149)/(81-98) 139/81 (01/05 0406) SpO2:  [95 %-97 %] 96 % (01/05 0406) Weight:  [106.9 kg] 106.9 kg (01/05 0500) Last BM Date : 03/23/23  Intake/Output from previous day: 01/04 0701 - 01/05 0700 In: 183 [P.O.:180; I.V.:3] Out: -  Intake/Output this shift: No intake/output data recorded.  General appearance: alert, cooperative, appears stated age, and morbidly obese Resp: clear to auscultation bilaterally Cardio: regular rate and rhythm, S1, S2 normal, no murmur, click, rub or gallop GI: soft, non-tender; bowel sounds normal; no masses,  no organomegaly  Lab Results: Recent Labs    03/24/23 0038 03/25/23 0644  WBC 9.4 7.9  HGB 13.9 13.5  HCT 39.7 38.3  PLT 229 181   BMET Recent Labs    03/24/23 0038 03/25/23 0644  NA 136 139  K 3.3* 3.2*  CL 101 105  CO2 19* 24  GLUCOSE 207* 126*  BUN 9 5*  CREATININE 0.72 0.58  CALCIUM  10.1 8.9   LFT Recent Labs    03/24/23 1127 03/25/23 0644  PROT 7.5 6.7  ALBUMIN 4.2 3.8  AST 34 24  ALT 29 22  ALKPHOS 81 71  BILITOT 1.0 1.1  BILIDIR 0.1  --   IBILI 0.9  --    Studies/Results: CT ABDOMEN PELVIS W CONTRAST Result Date: 03/24/2023 CLINICAL DATA:  Abdominal pain, acute, nonlocalized elevated lipase. * Tracking Code: BO * EXAM: CT ABDOMEN AND PELVIS WITH CONTRAST TECHNIQUE: Multidetector CT imaging of the abdomen and pelvis was performed using the standard protocol following bolus administration of intravenous contrast. RADIATION DOSE REDUCTION: This exam was performed  according to the departmental dose-optimization program which includes automated exposure control, adjustment of the mA and/or kV according to patient size and/or use of iterative reconstruction technique. CONTRAST:  75mL OMNIPAQUE  IOHEXOL  350 MG/ML SOLN COMPARISON:  CT scan abdomen and pelvis from 06/11/2021. FINDINGS: Lower chest: There are patchy atelectatic changes in the visualized lung bases. No overt consolidation. No pleural effusion. The heart is normal in size. No pericardial effusion. 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 Hepatobiliary: The liver is normal in size. Non-cirrhotic configuration. There are at least 10-12, ill-defined, hypoattenuating target like liver lesions with largest in the right hepatic lobe, segment 7 measuring up to 3.3 x 4.9 cm, compatible with metastases. No intrahepatic or extrahepatic bile duct dilation. No calcified gallstones. Normal gallbladder wall thickness. No pericholecystic inflammatory changes. Pancreas: There is heterogeneous, hypoattenuating mass centered in the pancreatic tail measuring up to 3.0 x 4.6 cm, highly concerning for pancreatic adenocarcinoma. 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 There is also fat stranding surrounding the pancreatic head/uncinate process and duodenum. The epicenter of the fat stranding appears to be centered more around duodenum and pancreas may be secondarily involved. Correlate clinically. Otherwise unremarkable  pancreas. Main pancreatic duct is not dilated. Spleen: Within normal limits. No focal lesion. Adrenals/Urinary Tract: Adrenal glands are unremarkable. No suspicious renal mass. No hydronephrosis. No renal or ureteric calculi. Unremarkable urinary  bladder. Stomach/Bowel: No disproportionate dilation of the small or large bowel loops. The appendix is unremarkable. There is mild circumferential and irregular thickening of the wall of second and third part of duodenum with surrounding fat stranding, concerning for duodenitis. No discrete duodenal ulcer seen. No pneumoperitoneum. There are multiple diverticula mainly in the left hemi colon, without imaging signs of diverticulitis. Vascular/Lymphatic: No ascites or pneumoperitoneum. No abdominal or pelvic lymphadenopathy, by size criteria. No aneurysmal dilation of the major abdominal arteries. There are mild peripheral atherosclerotic vascular calcifications of the aorta and its major branches. Reproductive: The uterus is unremarkable. No large adnexal mass. Other: There is a tiny fat containing umbilical hernia. The soft tissues and abdominal wall are otherwise unremarkable. Musculoskeletal: No suspicious osseous lesions. There are mild multilevel degenerative changes in the visualized spine. IMPRESSION: 1. 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 2. There is fat stranding predominantly surrounding the second and third parts of duodenum and also extending around the pancreatic head/uncinate process. Findings favor duodenitis with secondary involvement of pancreatic head/uncinate process. Correlate clinically. 3. Multiple other nonacute observations, as described above. Electronically Signed   By: Ree Molt M.D.   On: 03/24/2023 16:01    Medications: I have reviewed the patient's current medications. Prior to Admission:  Medications Prior to Admission  Medication Sig Dispense Refill Last Dose/Taking   albuterol  (VENTOLIN  HFA) 108 (90 Base) MCG/ACT inhaler 1 PUFF AS NEEDED EVERY 4 HOURS FOR COUGH/WHEEZE INHALATION 90 DAYS   Past Month   Albuterol -Budesonide (AIRSUPRA ) 90-80 MCG/ACT AERO Inhale 2  Inhalations into the lungs every 4 (four) hours as needed. Replaces albuterol , rescue inhaler 10.7 g 11 Past Month   Ascorbic Acid (VITAMIN C) 1000 MG tablet Take 1,000 mg by mouth daily.   03/23/2023   Azelastine -Fluticasone  137-50 MCG/ACT SUSP PLACE 1 SPRAY INTO THE NOSE EVERY 12 (TWELVE) HOURS. 23 g 1 03/23/2023   Cholecalciferol (D3-1000 PO) Take 1,000 Units by mouth daily.   03/23/2023   Continuous Glucose Sensor (DEXCOM G7 SENSOR) MISC 1 Act by Does not apply route daily. 9 each 1 Taking   Cyanocobalamin (VITAMIN B12 PO) Take by mouth daily.   03/23/2023   escitalopram  (LEXAPRO ) 20 MG tablet TAKE 1 TABLET BY MOUTH EVERY DAY 90 tablet 3 03/23/2023   ibuprofen (ADVIL) 600 MG tablet Take 600 mg by mouth every 8 (eight) hours as needed for cramping, moderate pain, mild pain, headache or fever.   Taking As Needed   insulin  glargine (LANTUS ) 100 UNIT/ML Solostar Pen Inject 20 Units into the skin daily. 15 mL 0 03/23/2023   levocetirizine (XYZAL ) 5 MG tablet Take 1 tablet (5 mg total) by mouth every evening. 30 tablet 2 03/23/2023   levothyroxine  (SYNTHROID ) 75 MCG tablet Take 1 tablet (75 mcg total) by mouth daily. 90 tablet 3 03/23/2023   losartan -hydrochlorothiazide  (HYZAAR) 50-12.5 MG tablet TAKE 1 TABLET BY MOUTH DAILY. OK TO START WITH JUST HALF TABLET DAILY FOR WEEK 1 AND LEAVE IT THERE IF GOAL OF 140/90 REACHED. (Patient taking differently: Take 1 tablet by mouth daily.) 90 tablet 4 03/23/2023   metFORMIN  (GLUCOPHAGE ) 500 MG tablet Take 2 tablets (1,000 mg total) by mouth 2 (two) times daily with a meal.  Replaces 500 mg twice daily dosing for better sugar control 360 tablet 3 03/23/2023   montelukast  (SINGULAIR ) 10 MG tablet Take 1 tablet by mouth every evening.  5 03/23/2023   rosuvastatin  (CRESTOR ) 20 MG tablet TAKE 1 TABLET BY MOUTH EVERY DAY 90 tablet 3 03/23/2023   Saline (SIMPLY SALINE) 0.9 % AERS Place 2 each into the nose as directed. Use nightly for sinus hygiene long-term.  Can also be used as many times  daily as desired to assist with clearing congested sinuses. 127 mL 11 Past Week   tirzepatide  (MOUNJARO ) 5 MG/0.5ML Pen Inject 5 mg into the skin once a week. Replaces Wegovy .  Move straight to this from Wegovy  0.5 mg 6 mL 2 03/23/2023   topiramate  (TOPAMAX ) 25 MG tablet TAKE 1 TABLET (25 MG TOTAL) BY MOUTH 2 (TWO) TIMES DAILY. TAKE JUST ONE TABLET DAILY FOR THE FIRST WEEK. 180 tablet 4 03/23/2023   Zinc 50 MG TABS Take by mouth daily.   03/23/2023   Blood Glucose Monitoring Suppl DEVI 1 each by Does not apply route in the morning, at noon, and at bedtime. May substitute to any manufacturer covered by patient's insurance. 1 each 0    Glucose Blood (BLOOD GLUCOSE TEST STRIPS) STRP 1 each by In Vitro route in the morning, at noon, and at bedtime. May substitute to any manufacturer covered by patient's insurance. 100 strip 0    insulin  aspart (NOVOLOG  FLEXPEN) 100 UNIT/ML FlexPen 0-9 Units, Subcutaneous, 3 times daily with meals CBG < 70: Implement Hypoglycemia measures CBG 70 - 120: 0 units CBG 121 - 150: 1 unit CBG 151 - 200: 2 units CBG 201 - 250: 3 units CBG 251 - 300: 5 units CBG 301 - 350: 7 units CBG 351 - 400: 9 units CBG > 400: call MD (Patient not taking: Reported on 03/16/2023) 15 mL 0 Not Taking   Insulin  Pen Needle 32G X 4 MM MISC 1 each 4 (four) times daily. 100 each 0    Lancet Device MISC 1 each by Does not apply route in the morning, at noon, and at bedtime. May substitute to any manufacturer covered by patient's insurance. 1 each 0    Lancets Misc. MISC 1 each by Does not apply route in the morning, at noon, and at bedtime. May substitute to any manufacturer covered by patient's insurance. 100 each 0    Scheduled:  enoxaparin  (LOVENOX ) injection  40 mg Subcutaneous Q24H   insulin  aspart  0-15 Units Subcutaneous Q4H   levothyroxine   75 mcg Oral Q0600   losartan   50 mg Oral Daily   montelukast   10 mg Oral QPM   rosuvastatin   20 mg Oral Daily   sodium chloride  flush  3 mL Intravenous Q12H    Continuous:  sodium chloride  125 mL/hr at 03/25/23 2358   PRN:acetaminophen  **OR** acetaminophen , albuterol , HYDROmorphone  (DILAUDID ) injection, ondansetron  **OR** ondansetron  (ZOFRAN ) IV, polyethylene glycol  Assessment/Plan: 1) Acute pancreatitis with abnormal CT scan of the abdomen pelvis with a 3.0 x 4.6 mass in the tail of the pancreas with invasion of the splenic vein and multiple liver lesions suspicious for primary adenocarcinoma of the pancreas-patient will need tissue diagnosis. Have discussed the possibilities with her and her family her husband and her mother by the bedside. All their questions have been answered. She will need a EUS with FNA.  I have discussed this with Dr. Diann Finner and he will try to do her procedure tomorrow. I will order a CA 19-9. 2)  Hypertension/hyperlipidemia. 3) Hypothyroidism. 4) AODM. 5) Asthma. 6) Morbid obesity/sleep apnea.    LOS: 1 day   Renaye Sous 03/26/2023, 7:44 AM

## 2023-03-26 NOTE — H&P (View-Only) (Signed)
 [facility] Subjective: Since I last evaluated the patient, she seems to be doing somewhat better.  Her abdominal pain is improved and she describes it as a soreness. She is awaiting EUS FNA for tissue diagnosis with regards to the mass in the pancreatic tail. Objective: Vital signs in last 24 hours: Temp:  [98.4 F (36.9 C)-99.2 F (37.3 C)] 98.4 F (36.9 C) (01/05 0406) Pulse Rate:  [71-86] 71 (01/05 0406) Resp:  [16-18] 16 (01/04 1607) BP: (139-149)/(81-98) 139/81 (01/05 0406) SpO2:  [95 %-97 %] 96 % (01/05 0406) Weight:  [106.9 kg] 106.9 kg (01/05 0500) Last BM Date : 03/23/23  Intake/Output from previous day: 01/04 0701 - 01/05 0700 In: 183 [P.O.:180; I.V.:3] Out: -  Intake/Output this shift: No intake/output data recorded.  General appearance: alert, cooperative, appears stated age, and morbidly obese Resp: clear to auscultation bilaterally Cardio: regular rate and rhythm, S1, S2 normal, no murmur, click, rub or gallop GI: soft, non-tender; bowel sounds normal; no masses,  no organomegaly  Lab Results: Recent Labs    03/24/23 0038 03/25/23 0644  WBC 9.4 7.9  HGB 13.9 13.5  HCT 39.7 38.3  PLT 229 181   BMET Recent Labs    03/24/23 0038 03/25/23 0644  NA 136 139  K 3.3* 3.2*  CL 101 105  CO2 19* 24  GLUCOSE 207* 126*  BUN 9 5*  CREATININE 0.72 0.58  CALCIUM  10.1 8.9   LFT Recent Labs    03/24/23 1127 03/25/23 0644  PROT 7.5 6.7  ALBUMIN 4.2 3.8  AST 34 24  ALT 29 22  ALKPHOS 81 71  BILITOT 1.0 1.1  BILIDIR 0.1  --   IBILI 0.9  --    Studies/Results: CT ABDOMEN PELVIS W CONTRAST Result Date: 03/24/2023 CLINICAL DATA:  Abdominal pain, acute, nonlocalized elevated lipase. * Tracking Code: BO * EXAM: CT ABDOMEN AND PELVIS WITH CONTRAST TECHNIQUE: Multidetector CT imaging of the abdomen and pelvis was performed using the standard protocol following bolus administration of intravenous contrast. RADIATION DOSE REDUCTION: This exam was performed  according to the departmental dose-optimization program which includes automated exposure control, adjustment of the mA and/or kV according to patient size and/or use of iterative reconstruction technique. CONTRAST:  75mL OMNIPAQUE  IOHEXOL  350 MG/ML SOLN COMPARISON:  CT scan abdomen and pelvis from 06/11/2021. FINDINGS: Lower chest: There are patchy atelectatic changes in the visualized lung bases. No overt consolidation. No pleural effusion. The heart is normal in size. No pericardial effusion. 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 Hepatobiliary: The liver is normal in size. Non-cirrhotic configuration. There are at least 10-12, ill-defined, hypoattenuating target like liver lesions with largest in the right hepatic lobe, segment 7 measuring up to 3.3 x 4.9 cm, compatible with metastases. No intrahepatic or extrahepatic bile duct dilation. No calcified gallstones. Normal gallbladder wall thickness. No pericholecystic inflammatory changes. Pancreas: There is heterogeneous, hypoattenuating mass centered in the pancreatic tail measuring up to 3.0 x 4.6 cm, highly concerning for pancreatic adenocarcinoma. 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 There is also fat stranding surrounding the pancreatic head/uncinate process and duodenum. The epicenter of the fat stranding appears to be centered more around duodenum and pancreas may be secondarily involved. Correlate clinically. Otherwise unremarkable  pancreas. Main pancreatic duct is not dilated. Spleen: Within normal limits. No focal lesion. Adrenals/Urinary Tract: Adrenal glands are unremarkable. No suspicious renal mass. No hydronephrosis. No renal or ureteric calculi. Unremarkable urinary  bladder. Stomach/Bowel: No disproportionate dilation of the small or large bowel loops. The appendix is unremarkable. There is mild circumferential and irregular thickening of the wall of second and third part of duodenum with surrounding fat stranding, concerning for duodenitis. No discrete duodenal ulcer seen. No pneumoperitoneum. There are multiple diverticula mainly in the left hemi colon, without imaging signs of diverticulitis. Vascular/Lymphatic: No ascites or pneumoperitoneum. No abdominal or pelvic lymphadenopathy, by size criteria. No aneurysmal dilation of the major abdominal arteries. There are mild peripheral atherosclerotic vascular calcifications of the aorta and its major branches. Reproductive: The uterus is unremarkable. No large adnexal mass. Other: There is a tiny fat containing umbilical hernia. The soft tissues and abdominal wall are otherwise unremarkable. Musculoskeletal: No suspicious osseous lesions. There are mild multilevel degenerative changes in the visualized spine. IMPRESSION: 1. 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 2. There is fat stranding predominantly surrounding the second and third parts of duodenum and also extending around the pancreatic head/uncinate process. Findings favor duodenitis with secondary involvement of pancreatic head/uncinate process. Correlate clinically. 3. Multiple other nonacute observations, as described above. Electronically Signed   By: Ree Molt M.D.   On: 03/24/2023 16:01    Medications: I have reviewed the patient's current medications. Prior to Admission:  Medications Prior to Admission  Medication Sig Dispense Refill Last Dose/Taking   albuterol  (VENTOLIN  HFA) 108 (90 Base) MCG/ACT inhaler 1 PUFF AS NEEDED EVERY 4 HOURS FOR COUGH/WHEEZE INHALATION 90 DAYS   Past Month   Albuterol -Budesonide (AIRSUPRA ) 90-80 MCG/ACT AERO Inhale 2  Inhalations into the lungs every 4 (four) hours as needed. Replaces albuterol , rescue inhaler 10.7 g 11 Past Month   Ascorbic Acid (VITAMIN C) 1000 MG tablet Take 1,000 mg by mouth daily.   03/23/2023   Azelastine -Fluticasone  137-50 MCG/ACT SUSP PLACE 1 SPRAY INTO THE NOSE EVERY 12 (TWELVE) HOURS. 23 g 1 03/23/2023   Cholecalciferol (D3-1000 PO) Take 1,000 Units by mouth daily.   03/23/2023   Continuous Glucose Sensor (DEXCOM G7 SENSOR) MISC 1 Act by Does not apply route daily. 9 each 1 Taking   Cyanocobalamin (VITAMIN B12 PO) Take by mouth daily.   03/23/2023   escitalopram  (LEXAPRO ) 20 MG tablet TAKE 1 TABLET BY MOUTH EVERY DAY 90 tablet 3 03/23/2023   ibuprofen (ADVIL) 600 MG tablet Take 600 mg by mouth every 8 (eight) hours as needed for cramping, moderate pain, mild pain, headache or fever.   Taking As Needed   insulin  glargine (LANTUS ) 100 UNIT/ML Solostar Pen Inject 20 Units into the skin daily. 15 mL 0 03/23/2023   levocetirizine (XYZAL ) 5 MG tablet Take 1 tablet (5 mg total) by mouth every evening. 30 tablet 2 03/23/2023   levothyroxine  (SYNTHROID ) 75 MCG tablet Take 1 tablet (75 mcg total) by mouth daily. 90 tablet 3 03/23/2023   losartan -hydrochlorothiazide  (HYZAAR) 50-12.5 MG tablet TAKE 1 TABLET BY MOUTH DAILY. OK TO START WITH JUST HALF TABLET DAILY FOR WEEK 1 AND LEAVE IT THERE IF GOAL OF 140/90 REACHED. (Patient taking differently: Take 1 tablet by mouth daily.) 90 tablet 4 03/23/2023   metFORMIN  (GLUCOPHAGE ) 500 MG tablet Take 2 tablets (1,000 mg total) by mouth 2 (two) times daily with a meal.  Replaces 500 mg twice daily dosing for better sugar control 360 tablet 3 03/23/2023   montelukast  (SINGULAIR ) 10 MG tablet Take 1 tablet by mouth every evening.  5 03/23/2023   rosuvastatin  (CRESTOR ) 20 MG tablet TAKE 1 TABLET BY MOUTH EVERY DAY 90 tablet 3 03/23/2023   Saline (SIMPLY SALINE) 0.9 % AERS Place 2 each into the nose as directed. Use nightly for sinus hygiene long-term.  Can also be used as many times  daily as desired to assist with clearing congested sinuses. 127 mL 11 Past Week   tirzepatide  (MOUNJARO ) 5 MG/0.5ML Pen Inject 5 mg into the skin once a week. Replaces Wegovy .  Move straight to this from Wegovy  0.5 mg 6 mL 2 03/23/2023   topiramate  (TOPAMAX ) 25 MG tablet TAKE 1 TABLET (25 MG TOTAL) BY MOUTH 2 (TWO) TIMES DAILY. TAKE JUST ONE TABLET DAILY FOR THE FIRST WEEK. 180 tablet 4 03/23/2023   Zinc 50 MG TABS Take by mouth daily.   03/23/2023   Blood Glucose Monitoring Suppl DEVI 1 each by Does not apply route in the morning, at noon, and at bedtime. May substitute to any manufacturer covered by patient's insurance. 1 each 0    Glucose Blood (BLOOD GLUCOSE TEST STRIPS) STRP 1 each by In Vitro route in the morning, at noon, and at bedtime. May substitute to any manufacturer covered by patient's insurance. 100 strip 0    insulin  aspart (NOVOLOG  FLEXPEN) 100 UNIT/ML FlexPen 0-9 Units, Subcutaneous, 3 times daily with meals CBG < 70: Implement Hypoglycemia measures CBG 70 - 120: 0 units CBG 121 - 150: 1 unit CBG 151 - 200: 2 units CBG 201 - 250: 3 units CBG 251 - 300: 5 units CBG 301 - 350: 7 units CBG 351 - 400: 9 units CBG > 400: call MD (Patient not taking: Reported on 03/16/2023) 15 mL 0 Not Taking   Insulin  Pen Needle 32G X 4 MM MISC 1 each 4 (four) times daily. 100 each 0    Lancet Device MISC 1 each by Does not apply route in the morning, at noon, and at bedtime. May substitute to any manufacturer covered by patient's insurance. 1 each 0    Lancets Misc. MISC 1 each by Does not apply route in the morning, at noon, and at bedtime. May substitute to any manufacturer covered by patient's insurance. 100 each 0    Scheduled:  enoxaparin  (LOVENOX ) injection  40 mg Subcutaneous Q24H   insulin  aspart  0-15 Units Subcutaneous Q4H   levothyroxine   75 mcg Oral Q0600   losartan   50 mg Oral Daily   montelukast   10 mg Oral QPM   rosuvastatin   20 mg Oral Daily   sodium chloride  flush  3 mL Intravenous Q12H    Continuous:  sodium chloride  125 mL/hr at 03/25/23 2358   PRN:acetaminophen  **OR** acetaminophen , albuterol , HYDROmorphone  (DILAUDID ) injection, ondansetron  **OR** ondansetron  (ZOFRAN ) IV, polyethylene glycol  Assessment/Plan: 1) Acute pancreatitis with abnormal CT scan of the abdomen pelvis with a 3.0 x 4.6 mass in the tail of the pancreas with invasion of the splenic vein and multiple liver lesions suspicious for primary adenocarcinoma of the pancreas-patient will need tissue diagnosis. Have discussed the possibilities with her and her family her husband and her mother by the bedside. All their questions have been answered. She will need a EUS with FNA.  I have discussed this with Dr. Diann Finner and he will try to do her procedure tomorrow. I will order a CA 19-9. 2)  Hypertension/hyperlipidemia. 3) Hypothyroidism. 4) AODM. 5) Asthma. 6) Morbid obesity/sleep apnea.    LOS: 1 day   Renaye Sous 03/26/2023, 7:44 AM

## 2023-03-27 ENCOUNTER — Encounter (HOSPITAL_COMMUNITY)
Admission: EM | Disposition: A | Discharge status: Home / Self Care | Payer: Self-pay | Source: Home / Self Care | Attending: Internal Medicine

## 2023-03-27 ENCOUNTER — Inpatient Hospital Stay (HOSPITAL_COMMUNITY): Payer: 59

## 2023-03-27 ENCOUNTER — Inpatient Hospital Stay (HOSPITAL_COMMUNITY): Payer: 59 | Admitting: Anesthesiology

## 2023-03-27 ENCOUNTER — Encounter (HOSPITAL_COMMUNITY): Payer: Self-pay | Admitting: Internal Medicine

## 2023-03-27 DIAGNOSIS — C787 Secondary malignant neoplasm of liver and intrahepatic bile duct: Secondary | ICD-10-CM | POA: Diagnosis not present

## 2023-03-27 DIAGNOSIS — K2970 Gastritis, unspecified, without bleeding: Secondary | ICD-10-CM

## 2023-03-27 DIAGNOSIS — K8689 Other specified diseases of pancreas: Secondary | ICD-10-CM

## 2023-03-27 DIAGNOSIS — K2289 Other specified disease of esophagus: Secondary | ICD-10-CM

## 2023-03-27 DIAGNOSIS — E119 Type 2 diabetes mellitus without complications: Secondary | ICD-10-CM | POA: Diagnosis not present

## 2023-03-27 DIAGNOSIS — R590 Localized enlarged lymph nodes: Secondary | ICD-10-CM | POA: Diagnosis not present

## 2023-03-27 DIAGNOSIS — K8500 Idiopathic acute pancreatitis without necrosis or infection: Secondary | ICD-10-CM | POA: Diagnosis not present

## 2023-03-27 DIAGNOSIS — C252 Malignant neoplasm of tail of pancreas: Secondary | ICD-10-CM | POA: Diagnosis not present

## 2023-03-27 HISTORY — PX: BIOPSY: SHX5522

## 2023-03-27 HISTORY — PX: ESOPHAGOGASTRODUODENOSCOPY (EGD) WITH PROPOFOL: SHX5813

## 2023-03-27 HISTORY — PX: EUS: SHX5427

## 2023-03-27 HISTORY — PX: FINE NEEDLE ASPIRATION: SHX5430

## 2023-03-27 LAB — CBC WITH DIFFERENTIAL/PLATELET
Abs Immature Granulocytes: 0.01 10*3/uL (ref 0.00–0.07)
Basophils Absolute: 0 10*3/uL (ref 0.0–0.1)
Basophils Relative: 0 %
Eosinophils Absolute: 0.3 10*3/uL (ref 0.0–0.5)
Eosinophils Relative: 6 %
HCT: 35.2 % — ABNORMAL LOW (ref 36.0–46.0)
Hemoglobin: 12.2 g/dL (ref 12.0–15.0)
Immature Granulocytes: 0 %
Lymphocytes Relative: 30 %
Lymphs Abs: 1.6 10*3/uL (ref 0.7–4.0)
MCH: 31.6 pg (ref 26.0–34.0)
MCHC: 34.7 g/dL (ref 30.0–36.0)
MCV: 91.2 fL (ref 80.0–100.0)
Monocytes Absolute: 0.5 10*3/uL (ref 0.1–1.0)
Monocytes Relative: 8 %
Neutro Abs: 3 10*3/uL (ref 1.7–7.7)
Neutrophils Relative %: 56 %
Platelets: 181 10*3/uL (ref 150–400)
RBC: 3.86 MIL/uL — ABNORMAL LOW (ref 3.87–5.11)
RDW: 13.2 % (ref 11.5–15.5)
WBC: 5.5 10*3/uL (ref 4.0–10.5)
nRBC: 0 % (ref 0.0–0.2)

## 2023-03-27 LAB — GLUCOSE, CAPILLARY
Glucose-Capillary: 100 mg/dL — ABNORMAL HIGH (ref 70–99)
Glucose-Capillary: 110 mg/dL — ABNORMAL HIGH (ref 70–99)
Glucose-Capillary: 124 mg/dL — ABNORMAL HIGH (ref 70–99)
Glucose-Capillary: 91 mg/dL (ref 70–99)
Glucose-Capillary: 97 mg/dL (ref 70–99)

## 2023-03-27 LAB — MAGNESIUM: Magnesium: 1.8 mg/dL (ref 1.7–2.4)

## 2023-03-27 LAB — COMPREHENSIVE METABOLIC PANEL
ALT: 17 U/L (ref 0–44)
AST: 21 U/L (ref 15–41)
Albumin: 3.3 g/dL — ABNORMAL LOW (ref 3.5–5.0)
Alkaline Phosphatase: 81 U/L (ref 38–126)
Anion gap: 13 (ref 5–15)
BUN: <5 mg/dL — ABNORMAL LOW (ref 6–20)
CO2: 20 mmol/L — ABNORMAL LOW (ref 22–32)
Calcium: 8.7 mg/dL — ABNORMAL LOW (ref 8.9–10.3)
Chloride: 106 mmol/L (ref 98–111)
Creatinine, Ser: 0.53 mg/dL (ref 0.44–1.00)
GFR, Estimated: >60 mL/min (ref >60)
Glucose, Bld: 119 mg/dL — ABNORMAL HIGH (ref 70–99)
Potassium: 3.6 mmol/L (ref 3.5–5.1)
Sodium: 139 mmol/L (ref 135–145)
Total Bilirubin: 1.2 mg/dL (ref 0.0–1.2)
Total Protein: 6.7 g/dL (ref 6.5–8.1)

## 2023-03-27 LAB — PHOSPHORUS: Phosphorus: 1.9 mg/dL — ABNORMAL LOW (ref 2.5–4.6)

## 2023-03-27 LAB — LIPASE, BLOOD: Lipase: 55 U/L — ABNORMAL HIGH (ref 11–51)

## 2023-03-27 SURGERY — ESOPHAGOGASTRODUODENOSCOPY (EGD) WITH PROPOFOL
Anesthesia: Monitor Anesthesia Care

## 2023-03-27 MED ORDER — DEXMEDETOMIDINE HCL IN NACL 200 MCG/50ML IV SOLN
INTRAVENOUS | Status: DC | PRN
  Administered 2023-03-27: 2 ug via INTRAVENOUS
  Administered 2023-03-27: 4 ug via INTRAVENOUS
  Administered 2023-03-27 (×2): 2 ug via INTRAVENOUS
  Administered 2023-03-27: 8 ug via INTRAVENOUS
  Administered 2023-03-27: 2 ug via INTRAVENOUS

## 2023-03-27 MED ORDER — PANTOPRAZOLE SODIUM 40 MG PO TBEC
40.0000 mg | DELAYED_RELEASE_TABLET | Freq: Two times a day (BID) | ORAL | Status: DC
  Administered 2023-03-27 – 2023-03-28 (×3): 40 mg via ORAL
  Filled 2023-03-27 (×3): qty 1

## 2023-03-27 MED ORDER — POTASSIUM & SODIUM PHOSPHATES 280-160-250 MG PO PACK
1.0000 | PACK | Freq: Three times a day (TID) | ORAL | Status: DC
  Administered 2023-03-27 – 2023-03-28 (×3): 1 via ORAL
  Filled 2023-03-27 (×7): qty 1

## 2023-03-27 MED ORDER — PROPOFOL 10 MG/ML IV BOLUS
INTRAVENOUS | Status: DC | PRN
  Administered 2023-03-27 (×2): 50 mg via INTRAVENOUS
  Administered 2023-03-27 (×2): 100 mg via INTRAVENOUS
  Administered 2023-03-27 (×5): 50 mg via INTRAVENOUS

## 2023-03-27 SURGICAL SUPPLY — 14 items

## 2023-03-27 NOTE — Anesthesia Preprocedure Evaluation (Signed)
 Anesthesia Evaluation  Patient identified by MRN, date of birth, ID band Patient awake    Reviewed: Allergy & Precautions, NPO status , Patient's Chart, lab work & pertinent test results, reviewed documented beta blocker date and time   History of Anesthesia Complications Negative for: history of anesthetic complications  Airway Mallampati: III  TM Distance: >3 FB   Mouth opening: Limited Mouth Opening  Dental no notable dental hx.    Pulmonary neg shortness of breath, asthma , sleep apnea , neg COPD, neg PE   breath sounds clear to auscultation       Cardiovascular hypertension, (-) angina (-) CAD, (-) Past MI, (-) Cardiac Stents and (-) CABG  Rhythm:Regular Rate:Normal     Neuro/Psych neg Seizures PSYCHIATRIC DISORDERS  Depression     Neuromuscular disease    GI/Hepatic Pancreatic tail mass   Endo/Other  diabetes, Type 2Hypothyroidism    Renal/GU ARFRenal disease     Musculoskeletal   Abdominal   Peds  Hematology   Anesthesia Other Findings   Reproductive/Obstetrics                             Anesthesia Physical Anesthesia Plan  ASA: 2  Anesthesia Plan: MAC   Post-op Pain Management:    Induction: Intravenous  PONV Risk Score and Plan: 2 and Ondansetron   Airway Management Planned:   Additional Equipment:   Intra-op Plan:   Post-operative Plan:   Informed Consent: I have reviewed the patients History and Physical, chart, labs and discussed the procedure including the risks, benefits and alternatives for the proposed anesthesia with the patient or authorized representative who has indicated his/her understanding and acceptance.     Dental advisory given  Plan Discussed with: CRNA  Anesthesia Plan Comments:        Anesthesia Quick Evaluation

## 2023-03-27 NOTE — Op Note (Signed)
 [facility] Patient Name: Debbie [name] Procedure Date : 03/27/2023 MRN: [id number] Attending MD: Aloha Finner , MD, [id number] Date of Birth: Sep 20, 1970 CSN: [id number] Age: 53 Admit Type: Inpatient Procedure:                Upper EUS Indications:              Suspected mass in pancreas on CT scan, Epigastric                            abdominal pain Providers:                Aloha Finner, MD, Corky Czech, Technician,                            Almarie Pizza, RN Referring MD:             Inpatient medical service Medicines:                Monitored Anesthesia Care Complications:            No immediate complications. Estimated Blood Loss:     Estimated blood loss was minimal. Procedure:                Pre-Anesthesia Assessment:                           - Prior to the procedure, a History and Physical                            was performed, and patient medications and                            allergies were reviewed. The patient's tolerance of                            previous anesthesia was also reviewed. The risks                            and benefits of the procedure and the sedation                            options and risks were discussed with the patient.                            All questions were answered, and informed consent                            was obtained. Prior Anticoagulants: The patient has                            taken Lovenox  (enoxaparin ), last dose was 1 day                            prior to procedure. ASA Grade Assessment: III - A  patient with severe systemic disease. After                            reviewing the risks and benefits, the patient was                            deemed in satisfactory condition to undergo the                            procedure.                           After obtaining informed consent, the endoscope was                            passed under direct  vision. Throughout the                            procedure, the patient's blood pressure, pulse, and                            oxygen saturations were monitored continuously. The                            [id number]-[id number] ([id number]) Olympus endoscope was introduced                            through the mouth, and advanced to the second part                            of duodenum. The [id number]-[id number] ([id number]) Olympus                            duodenoscope was introduced through the mouth, and                            advanced to the area of papilla. The [id number]                            ([id number]) Olympus linear ultrasound scope was                            introduced through the mouth, and advanced to the                            duodenum for ultrasound examination from the                            stomach and duodenum. The upper EUS was                            accomplished without difficulty. The patient  tolerated the procedure. Scope In: Scope Out: Findings:      ENDOSCOPIC FINDING: :      No gross lesions were noted in the entire esophagus.      The Z-line was irregular and was found 39 cm from the incisors.      Segmental moderate inflammation characterized by erosions, erythema and       friability was found in the entire examined stomach. Biopsies were taken       with a cold forceps for histology and Helicobacter pylori testing.      No gross lesions were noted in the duodenal bulb, in the first portion       of the duodenum and in the second portion of the duodenum.      The major papilla was normal.      ENDOSONOGRAPHIC FINDING: :      An irregular mass was identified in the pancreatic tail. The mass was       hypoechoic. The mass measured 34 mm by 25 mm in maximal cross-sectional       diameter. The endosonographic borders were well-defined. There was       sonographic evidence suggesting invasion into the splenic artery       (manifested by  interface loss less than 15 mm) and the splenic vein       (manifested by interface loss less than 15 mm). An intact interface was       seen between the mass and the superior mesenteric artery and celiac       trunk suggesting a lack of invasion. The remainder of the pancreas was       examined. The endosonographic appearance of parenchyma and the upstream       pancreatic duct indicated a normal appearing duct, a maximum duct       diameter of 1 mm and no parenchymal atrophy. Fine needle biopsy was       performed. Color Doppler imaging was utilized prior to needle puncture       to confirm a lack of significant vascular structures within the needle       path. Six passes were made with the 22 gauge Acquire biopsy needle using       a transgastric approach. A visible core of tissue was obtained.       Preliminary cytologic examination and touch preps were performed. The       cellularity of the specimen was adequate. Final cytology results are       pending.      There was no sign of significant endosonographic abnormality in the       common bile duct and in the common hepatic duct. An unremarkable       gallbladder was identified.      Endosonographic imaging of the ampulla showed no intramural       (subepithelial) lesion.      Four round lesions were identified endosonographically in the visualized       portion of the liver. The endosonographic appearance is suggestive of a       metastatic process. The lesions were hypoechoic. The largest lesion       measured 13 mm by 14 mm in maximal cross-sectional diameter (the other       lesions measured 10 mm x 8 mm, 9 mm x 7 mm, 13 mm x 12 mm). The outer       margins were irregular. Many of these lesions  were deep into the liver       parenchyma (approximately 3 to 4 cm from the liver capsule).      One enlarged lymph node was visualized in the porta hepatis region. It       measured 6 mm by 5 mm in maximal cross-sectional diameter. The  node was       round, hypoechoic and had well defined margins.      The celiac region was visualized. Impression:               EGD impression:                           - No gross lesions in the entire esophagus. Z-line                            irregular, 39 cm from the incisors.                           - Gastritis. Biopsied.                           - No gross lesions in the duodenal bulb, in the                            first portion of the duodenum and in the second                            portion of the duodenum.                           - Normal major papilla.                           EUS impression:                           - A mass was identified in the pancreatic tail.                            Cytology results are pending. However, the                            endosonographic appearance is highly suspicious for                            adenocarcinoma. This was staged T2 N0 M1 (based on                            endosonographic criteria of metastasis to the liver                            - tissue was not obtained) by endosonographic                            criteria. However, the overall mass based on  cross-sectional imaging is greater than 4 cm as                            well as it would truly be staged T3 N0 M1. The                            staging applies if malignancy is confirmed. Fine                            needle biopsy performed as noted above.                           - There was no sign of significant pathology in the                            common bile duct and in the common hepatic duct.                           - Four metastatic appearing lesions were found in                            the visualized portion of the left lobe of the                            liver. Tissue has not been obtained. However, the                            endosonographic appearance is highly suspicious for                             metastatic pancreatic adenocarcinoma.                           - One enlarged lymph node was visualized in the                            porta hepatis region. Tissue has not been obtained.                            However, the endosonographic appearance is                            suspicious for benign inflammatory changes.                           . Recommendation:           - The patient will be observed post-procedure,                            until all discharge criteria are met.                           - Patient has a contact number available for  emergencies. The signs and symptoms of potential                            delayed complications were discussed with the                            patient. Return to normal activities tomorrow.                            Written discharge instructions were provided to the                            patient.                           - Return patient to hospital ward for ongoing care.                           - Low fat diet.                           - Observe patient's clinical course.                           - Initiate pantoprazole  40 mg once daily.                           - Await cytology results, await path results and                            await CA 19-9.                           - Recommend patient have CT chest for completion                            staging of likely underlying pancreatic malignancy.                           - Consider inpatient versus outpatient referral to                            oncology as well as surgical oncology (pending                            pathology, when it returns I will update the                            patient and the team).                           - The findings and recommendations were discussed                            with the patient.                           -  The findings and recommendations were discussed                             with the patient's family. Procedure Code(s):        --- Professional ---                           [phone], Esophagogastroduodenoscopy, flexible,                            transoral; with transendoscopic ultrasound-guided                            intramural or transmural fine needle                            aspiration/biopsy(s), (includes endoscopic                            ultrasound examination limited to the esophagus,                            stomach or duodenum, and adjacent structures)                           43239, 59, Esophagogastroduodenoscopy, flexible,                            transoral; with biopsy, single or multiple Diagnosis Code(s):        --- Professional ---                           K22.89, Other specified disease of esophagus                           K29.70, Gastritis, unspecified, without bleeding                           K86.89, Other specified diseases of pancreas                           C78.7, Secondary malignant neoplasm of liver and                            intrahepatic bile duct                           R59.0, Localized enlarged lymph nodes                           R10.13, Epigastric pain                           R93.3, Abnormal findings on diagnostic imaging of                            other parts of digestive tract CPT copyright 2022 American  Medical Association. All rights reserved. The codes documented in this report are preliminary and upon coder review may  be revised to meet current compliance requirements. Aloha Finner, MD 03/27/2023 2:58:57 PM Number of Addenda: 0

## 2023-03-27 NOTE — Transfer of Care (Signed)
 Immediate Anesthesia Transfer of Care Note  Patient: Debbie [name]  Procedure(s) Performed: ESOPHAGOGASTRODUODENOSCOPY (EGD) WITH PROPOFOL  BIOPSY UPPER ENDOSCOPIC ULTRASOUND (EUS) LINEAR FINE NEEDLE ASPIRATION (FNA) LINEAR  Patient Location: Endoscopy Unit  Anesthesia Type:MAC  Level of Consciousness: drowsy and patient cooperative  Airway & Oxygen Therapy: Patient Spontanous Breathing  Post-op Assessment: Report given to RN and Post -op Vital signs reviewed and stable  Post vital signs: Reviewed and stable  Last Vitals:  Vitals Value Taken Time  BP 117/76 03/27/23 1445  Temp 36.5 C 03/27/23 1445  Pulse 72 03/27/23 1449  Resp 16 03/27/23 1449  SpO2 94 % 03/27/23 1449  Vitals shown include unfiled device data.  Last Pain:  Vitals:   03/27/23 1445  TempSrc: Temporal  PainSc: 0-No pain      Patients Stated Pain Goal: 1 (03/25/23 2326)  Complications: No notable events documented.

## 2023-03-27 NOTE — Interval H&P Note (Signed)
 History and Physical Interval Note:  03/27/2023 1:48 PM  Sholonda T Heathcock  has presented today for surgery, with the diagnosis of Pancreatic tail mass.  The various methods of treatment have been discussed with the patient and family. After consideration of risks, benefits and other options for treatment, the patient has consented to  Procedure(s) with comments: ESOPHAGOGASTRODUODENOSCOPY (EGD) WITH PROPOFOL  (N/A) UPPER ESOPHAGEAL ENDOSCOPIC ULTRASOUND (EUS) (N/A) - Craddick tail mass as a surgical intervention.  The patient's history has been reviewed, patient examined, no change in status, stable for surgery.  I have reviewed the patient's chart and labs.  Questions were answered to the patient's satisfaction.    The risks of an EUS including intestinal perforation, bleeding, infection, aspiration, and medication effects were discussed as was the possibility it may not give a definitive diagnosis if a biopsy is performed.  When a biopsy of the pancreas is done as part of the EUS, there is an additional risk of pancreatitis at the rate of about 1-2%.  It was explained that procedure related pancreatitis is typically mild, although it can be severe and even life threatening, which is why we do not perform random pancreatic biopsies and only biopsy a lesion/area we feel is concerning enough to warrant the risk.    Lael Pilch Mansouraty Jr

## 2023-03-27 NOTE — Progress Notes (Signed)
 PROGRESS NOTE  DEMETRIA IWAI  FMW:[id number] DOB: May 29, 1970 DOA: 03/24/2023 PCP: Jesus Bernardino MATSU, MD   Brief Narrative: Patient is a 53 year old female with history of hypertension, hyperlipidemia, hypothyroidism, diabetes, obesity who presented with nausea, vomiting, abdominal pain.  On presentation, she was hypertensive.  Lab work showed potassium of 3.3, lipase of 1232.  Patient admitted for the management of acute pancreatitis.  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  GI also consulted.  Assessment & Plan:  Principal Problem:   Acute pancreatitis Active Problems:   Essential hypertension   Hyperlipidemia   Hypothyroidism   Morbid obesity (HCC)   Type 2 diabetes mellitus (HCC)   Asthma, chronic   Acute pancreatitis: Presented with nausea, vomiting, abdominal pain, elevated lipase.  No history of alcohol abuse.  Patient most likely secondary to pancreatic mass which was incidentally seen on the CT scan.   Clinically improving.  Lipase normalizing.  Tolerating clears.  Will challenge with regular diet after the procedure today.    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  GI following.  Possible EUS/biopsy today.  Hypokalemia: Replaced.  Adequate. Hypophosphatemia: Replace.  Hypertension: Hypertensive on presentation.  Blood pressure better now.  Continue losartan   Hyperlipidemia: Liver enzymes normal.  Continue statin  Hypothyroidism: Continue Synthyroid  Diabetes type 2: Recently diagnosed.  .Currently on sliding scale.  Monitor blood sugars  Asthma: Currently not on exacerbation.  Continue bronchodilators  as needed  Morbid obesity: BMI 40.6        DVT prophylaxis:enoxaparin   (LOVENOX ) injection 40 mg Start: 03/24/23 1430     Code Status: Full Code  Family Communication: Husband at the bedside  Patient status: Inpatient.  IV fluid.,  Inpatient procedures  Patient is from :Home  Anticipated discharge un:ynfz  Estimated DC date:after full work up   Consultants: GI  Procedures:None yet  Antimicrobials:  Anti-infectives (From admission, onward)    None       Subjective:  Patient seen and examined.  Husband at the bedside.  Denies any complaints.  Denies any nausea vomiting or abdominal pain today.  Looking forward for procedure.  Objective: Vitals:   03/27/23 0004 03/27/23 0207 03/27/23 0518 03/27/23 0743  BP: (!) 153/91  139/85 130/86  Pulse: 77  73 77  Resp: 18  18 18   Temp: 98.9 F (37.2 C) 98.6 F (37 C) 98.9 F (37.2 C) 98.3 F (36.8 C)  TempSrc:  Oral    SpO2: 98%  97% 97%  Weight:      Height:        Intake/Output Summary (Last 24 hours) at 03/27/2023 1124 Last data filed at 03/27/2023 1031 Gross per 24 hour  Intake 3 ml  Output --  Net 3 ml   Filed Weights   03/24/23 0031 03/25/23 0500 03/26/23 0500  Weight: 105.6 kg 107.4 kg 106.9 kg    Examination:  General: Fairly comfortable.  On room air. Cardiovascular: S1-S2 normal.  Regular rate rhythm. Respiratory: Bilateral clear.  No added sounds. Gastrointestinal: Soft.  Nontender.  Bowel sounds present. Ext: No swelling or edema. Neuro: Alert awake and oriented.  No focal neurological deficits.    Data Reviewed: I have personally reviewed following labs and imaging studies  CBC: Recent Labs  Lab 03/24/23  0038 03/25/23 0644 03/27/23 0546  WBC 9.4 7.9 5.5  NEUTROABS  --   --  3.0  HGB 13.9 13.5 12.2  HCT 39.7 38.3 35.2*  MCV 91.1 90.1 91.2  PLT 229 181 181   Basic Metabolic Panel: Recent Labs  Lab 03/24/23 0038 03/25/23 0644 03/26/23 0658 03/27/23 0546  NA 136 139 139 139  K 3.3* 3.2* 3.1* 3.6  CL 101 105 106 106  CO2 19* 24 22 20*  GLUCOSE 207*  126* 136* 119*  BUN 9 5* <5* <5*  CREATININE 0.72 0.58 0.59 0.53  CALCIUM  10.1 8.9 8.6* 8.7*  MG  --   --   --  1.8  PHOS  --   --   --  1.9*     No results found for this or any previous visit (from the past 240 hours).   Radiology Studies: No results found.   Scheduled Meds:  enoxaparin  (LOVENOX ) injection  40 mg Subcutaneous Q24H   insulin  aspart  0-15 Units Subcutaneous Q4H   levothyroxine   75 mcg Oral Q0600   losartan   50 mg Oral Daily   montelukast   10 mg Oral QPM   potassium & sodium phosphates   1 packet Oral TID WC & HS   rosuvastatin   20 mg Oral Daily   sodium chloride  flush  3 mL Intravenous Q12H   Continuous Infusions:  sodium chloride  125 mL/hr at 03/26/23 2355     LOS: 2 days   Renato Applebaum, MD [facility] P1/08/2023, 11:24 AM

## 2023-03-27 NOTE — Anesthesia Postprocedure Evaluation (Signed)
 Anesthesia Post Note  Patient: Debbie [name]  Procedure(s) Performed: ESOPHAGOGASTRODUODENOSCOPY (EGD) WITH PROPOFOL  BIOPSY UPPER ENDOSCOPIC ULTRASOUND (EUS) LINEAR FINE NEEDLE ASPIRATION (FNA) LINEAR     Patient location during evaluation: PACU Anesthesia Type: MAC Level of consciousness: awake and alert Pain management: pain level controlled Vital Signs Assessment: post-procedure vital signs reviewed and stable Respiratory status: spontaneous breathing, nonlabored ventilation, respiratory function stable and patient connected to nasal cannula oxygen Cardiovascular status: stable and blood pressure returned to baseline Postop Assessment: no apparent nausea or vomiting Anesthetic complications: no  No notable events documented.  Last Vitals:  Vitals:   03/27/23 1510 03/27/23 1638  BP: 123/79 (!) 141/93  Pulse: 72 66  Resp: 20 18  Temp:  36.4 C  SpO2: 94% 98%    Last Pain:  Vitals:   03/27/23 1510  TempSrc:   PainSc: 0-No pain                 Geovanny Sartin L Kutler Vanvranken

## 2023-03-28 DIAGNOSIS — C787 Secondary malignant neoplasm of liver and intrahepatic bile duct: Secondary | ICD-10-CM | POA: Diagnosis not present

## 2023-03-28 DIAGNOSIS — K8500 Idiopathic acute pancreatitis without necrosis or infection: Secondary | ICD-10-CM | POA: Diagnosis not present

## 2023-03-28 DIAGNOSIS — R109 Unspecified abdominal pain: Secondary | ICD-10-CM

## 2023-03-28 DIAGNOSIS — K8689 Other specified diseases of pancreas: Secondary | ICD-10-CM

## 2023-03-28 DIAGNOSIS — C252 Malignant neoplasm of tail of pancreas: Secondary | ICD-10-CM | POA: Diagnosis not present

## 2023-03-28 DIAGNOSIS — E119 Type 2 diabetes mellitus without complications: Secondary | ICD-10-CM

## 2023-03-28 DIAGNOSIS — R112 Nausea with vomiting, unspecified: Secondary | ICD-10-CM

## 2023-03-28 LAB — CANCER ANTIGEN 19-9
CA 19-9: 126444 U/mL — ABNORMAL HIGH (ref 0–35)
CA 19-9: 121720 U/mL — ABNORMAL HIGH (ref 0–35)

## 2023-03-28 LAB — GLUCOSE, CAPILLARY
Glucose-Capillary: 100 mg/dL — ABNORMAL HIGH (ref 70–99)
Glucose-Capillary: 101 mg/dL — ABNORMAL HIGH (ref 70–99)
Glucose-Capillary: 112 mg/dL — ABNORMAL HIGH (ref 70–99)

## 2023-03-28 LAB — SURGICAL PATHOLOGY

## 2023-03-28 MED ORDER — ONDANSETRON HCL 4 MG PO TABS: 4.0000 mg | ORAL_TABLET | Freq: Four times a day (QID) | ORAL | 0 refills | Status: DC | PRN

## 2023-03-28 MED ORDER — PANTOPRAZOLE SODIUM 40 MG PO TBEC: 40.0000 mg | DELAYED_RELEASE_TABLET | Freq: Two times a day (BID) | ORAL | 2 refills | Status: DC

## 2023-03-28 MED ORDER — OXYCODONE HCL 5 MG PO TABS: 5.0000 mg | ORAL_TABLET | ORAL | 0 refills | Status: DC | PRN

## 2023-03-28 NOTE — Discharge Summary (Signed)
 Physician Discharge Summary  Debbie [name] FMW:[id number] DOB: Nov 08, 1970 DOA: 03/24/2023  PCP: Jesus Bernardino MATSU, MD  Admit date: 03/24/2023 Discharge date: 03/28/2023  Admitted From: Home Disposition: Home  Recommendations for Outpatient Follow-up:  Follow up with PCP in 1-2 weeks Oncology to schedule follow-up   Discharge Condition: Stable CODE STATUS: Full code Diet recommendation: Low-salt and low-carb diet  Discharge summary: Patient is a 53 year old female with history of hypertension, hyperlipidemia, hypothyroidism, diabetes, obesity who presented with nausea, vomiting, abdominal pain.  On presentation, she was hypertensive.  Lab work showed potassium of 3.3, lipase of 1232.  Patient admitted for the management of acute pancreatitis.  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  GI also consulted. Underwent EUS, biopsy consistent with adenocarcinoma.    Consistent with pancreatic adenocarcinoma with liver mets.  CT scan of the chest also showed right lower lobe 8 x 4 mm spiculated nodule. Abdominal pain has improved.  She is tolerating soft diet and liquid diet.  Currently symptoms are controlled.  Seen by oncology, plans for office follow-up within a week. Able to discharge home.  Will prescribe short course of pain medications and nausea medications.  Acute pancreatitis, resolved Pancreatic adenocarcinoma with metastatic lesions, follow-up at cancer center. Essential hypertension, stable.  Resume home medications. Hypothyroidism, on Synthroid .  Resume. Type 2 diabetes, recently diagnosed.  On insulin  regimen at home.  Stable for discharge with scheduled outpatient follow-up.  Discharge Diagnoses:  Principal Problem:   Acute pancreatitis Active Problems:   Essential hypertension   Hyperlipidemia   Hypothyroidism   Morbid obesity (HCC)    Type 2 diabetes mellitus (HCC)   Asthma, chronic   Pancreatic mass   Gastritis and gastroduodenitis    Discharge Instructions  Discharge Instructions     Diet - low sodium heart healthy   Complete by: As directed    Diet Carb Modified   Complete by: As directed    Increase activity slowly   Complete by: As directed       Allergies as of 03/28/2023       Reactions   Atorvastatin Hives        Medication List     STOP taking these medications    NovoLOG  FlexPen 100 UNIT/ML FlexPen Generic drug: insulin  aspart       TAKE these medications    Airsupra  90-80 MCG/ACT Aero Generic drug: Albuterol -Budesonide Inhale 2 Inhalations into the lungs every 4 (four) hours as needed. Replaces albuterol , rescue inhaler   albuterol  108 (90 Base) MCG/ACT inhaler Commonly known as: VENTOLIN  HFA 1 PUFF AS NEEDED EVERY 4 HOURS FOR COUGH/WHEEZE INHALATION 90 DAYS   Azelastine -Fluticasone  137-50 MCG/ACT Susp PLACE 1 SPRAY INTO THE NOSE EVERY 12 (TWELVE) HOURS.   BD Pen Needle Nano U/F 32G X 4 MM Misc Generic drug: Insulin  Pen Needle 1 each 4 (four) times daily.   Blood Glucose Monitoring Suppl Devi 1 each by Does not apply route in the morning, at noon, and at bedtime. May substitute to any manufacturer covered by patient's insurance.   BLOOD GLUCOSE TEST STRIPS Strp 1 each by In Vitro route in the morning, at noon, and at bedtime. May substitute to any manufacturer covered by patient's insurance.   D3-1000 PO Take 1,000 Units by mouth daily.   Dexcom G7 Sensor Misc 1 Act by Does not apply route daily.   escitalopram  20 MG tablet  Commonly known as: LEXAPRO  TAKE 1 TABLET BY MOUTH EVERY DAY   ibuprofen 600 MG tablet Commonly known as: ADVIL Take 600 mg by mouth every 8 (eight) hours as needed for cramping, moderate pain, mild pain, headache or fever.   Lancet Device Misc 1 each by Does not apply route in the morning, at noon, and at bedtime. May substitute to any  manufacturer covered by patient's insurance.   Lancets Misc. Misc 1 each by Does not apply route in the morning, at noon, and at bedtime. May substitute to any manufacturer covered by patient's insurance.   Lantus  SoloStar 100 UNIT/ML Solostar Pen Generic drug: insulin  glargine Inject 20 Units into the skin daily.   levocetirizine 5 MG tablet Commonly known as: XYZAL  Take 1 tablet (5 mg total) by mouth every evening.   levothyroxine  75 MCG tablet Commonly known as: SYNTHROID  Take 1 tablet (75 mcg total) by mouth daily.   losartan -hydrochlorothiazide  50-12.5 MG tablet Commonly known as: HYZAAR TAKE 1 TABLET BY MOUTH DAILY. OK TO START WITH JUST HALF TABLET DAILY FOR WEEK 1 AND LEAVE IT THERE IF GOAL OF 140/90 REACHED. What changed: additional instructions   metFORMIN  500 MG tablet Commonly known as: GLUCOPHAGE  Take 2 tablets (1,000 mg total) by mouth 2 (two) times daily with a meal. Replaces 500 mg twice daily dosing for better sugar control   montelukast  10 MG tablet Commonly known as: SINGULAIR  Take 1 tablet by mouth every evening.   ondansetron  4 MG tablet Commonly known as: ZOFRAN  Take 1 tablet (4 mg total) by mouth every 6 (six) hours as needed for nausea.   oxyCODONE  5 MG immediate release tablet Commonly known as: Roxicodone  Take 1 tablet (5 mg total) by mouth every 4 (four) hours as needed for up to 5 days for severe pain (pain score 7-10) or moderate pain (pain score 4-6).   pantoprazole  40 MG tablet Commonly known as: PROTONIX  Take 1 tablet (40 mg total) by mouth 2 (two) times daily.   rosuvastatin  20 MG tablet Commonly known as: CRESTOR  TAKE 1 TABLET BY MOUTH EVERY DAY   Simply Saline 0.9 % Aers Generic drug: Saline Place 2 each into the nose as directed. Use nightly for sinus hygiene long-term.  Can also be used as many times daily as desired to assist with clearing congested sinuses.   tirzepatide  5 MG/0.5ML Pen Commonly known as: MOUNJARO  Inject 5 mg  into the skin once a week. Replaces Wegovy .  Move straight to this from Wegovy  0.5 mg   topiramate  25 MG tablet Commonly known as: TOPAMAX  TAKE 1 TABLET (25 MG TOTAL) BY MOUTH 2 (TWO) TIMES DAILY. TAKE JUST ONE TABLET DAILY FOR THE FIRST WEEK.   VITAMIN B12 PO Take by mouth daily.   vitamin C 1000 MG tablet Take 1,000 mg by mouth daily.   Zinc 50 MG Tabs Take by mouth daily.        Allergies  Allergen Reactions   Atorvastatin Hives    Consultations: GI Oncology   Procedures/Studies: CT CHEST WO CONTRAST Result Date: 03/27/2023 CLINICAL DATA:  Occult malignancy EXAM: CT CHEST WITHOUT CONTRAST TECHNIQUE: Multidetector CT imaging of the chest was performed following the standard protocol without IV contrast. RADIATION DOSE REDUCTION: This exam was performed according to the departmental dose-optimization program which includes automated exposure control, adjustment of the mA and/or kV according to patient size and/or use of iterative reconstruction technique. COMPARISON:  CT chest 06/17/2020. CT abdomen and pelvis 03/24/2023. FINDINGS: Cardiovascular: No significant vascular findings.  Normal heart size. No pericardial effusion. Mediastinum/Nodes: There is a right thyroid nodule measuring 12 mm. There are no enlarged mediastinal or hilar lymph nodes. Esophagus is within limits. There are 2 cystic areas in the right cardiophrenic angle which appear unchanged from 2022 favored as pericardial cyst. The largest measures up to 3 cm. Lungs/Pleura: New spiculated nodular density in the superior segment of the right lower lobe measures 8 x 4 mm on image 3/62. The lungs are otherwise clear. Upper Abdomen: Hepatic and pancreatic lesions are again seen and appear unchanged from CT abdomen and pelvis 03/23/1998. Musculoskeletal: No focal osseous lesion. Degenerative changes affect the spine. IMPRESSION: 1. New spiculated nodular density in the superior segment of the right lower lobe measuring 8 x 4  mm. Findings are worrisome for malignancy. 2. Stable cystic areas in the right cardiophrenic angle favored as pericardial cysts. 3. Stable hepatic and pancreatic lesions. 4. Incidental right thyroid nodule measuring 1.2 cm. No follow-up imaging is recommended. Reference: J Am Coll Radiol. 2015 Feb;12(2): 143-50 Electronically Signed   By: Greig Pique M.D.   On: 03/27/2023 23:37   CT ABDOMEN PELVIS W CONTRAST Result Date: 03/24/2023 CLINICAL DATA:  Abdominal pain, acute, nonlocalized elevated lipase. * Tracking Code: BO * EXAM: CT ABDOMEN AND PELVIS WITH CONTRAST TECHNIQUE: Multidetector CT imaging of the abdomen and pelvis was performed using the standard protocol following bolus administration of intravenous contrast. RADIATION DOSE REDUCTION: This exam was performed according to the departmental dose-optimization program which includes automated exposure control, adjustment of the mA and/or kV according to patient size and/or use of iterative reconstruction technique. CONTRAST:  75mL OMNIPAQUE  IOHEXOL  350 MG/ML SOLN COMPARISON:  CT scan abdomen and pelvis from 06/11/2021. FINDINGS: Lower chest: There are patchy atelectatic changes in the visualized lung bases. No overt consolidation. No pleural effusion. The heart is normal in size. No pericardial effusion. 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 Hepatobiliary: The liver is normal in size. Non-cirrhotic configuration. There are at least 10-12, ill-defined, hypoattenuating target like liver lesions with largest in the right hepatic lobe, segment 7 measuring up to 3.3 x 4.9 cm, compatible with metastases. No intrahepatic or extrahepatic bile duct dilation. No calcified gallstones. Normal gallbladder wall thickness. No pericholecystic inflammatory changes. Pancreas: There is heterogeneous, hypoattenuating mass centered in the pancreatic tail measuring  up to 3.0 x 4.6 cm, highly concerning for pancreatic adenocarcinoma. 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 There is also fat stranding surrounding the pancreatic head/uncinate process and duodenum. The epicenter of the fat stranding appears to be centered more around duodenum and pancreas may be secondarily involved. Correlate clinically. Otherwise unremarkable pancreas. Main pancreatic duct is not dilated. Spleen: Within normal limits. No focal lesion. Adrenals/Urinary Tract: Adrenal glands are unremarkable. No suspicious renal mass. No hydronephrosis. No renal or ureteric calculi. Unremarkable urinary bladder. Stomach/Bowel: No disproportionate dilation of the small or large bowel loops. The appendix is unremarkable. There is mild circumferential and irregular thickening of the wall of second and third part of duodenum with surrounding fat stranding, concerning for duodenitis. No discrete duodenal ulcer seen. No pneumoperitoneum. There are multiple diverticula mainly in the left hemi colon, without imaging signs of diverticulitis. Vascular/Lymphatic: No ascites or pneumoperitoneum. No abdominal or pelvic lymphadenopathy,  by size criteria. No aneurysmal dilation of the major abdominal arteries. There are mild peripheral atherosclerotic vascular calcifications of the aorta and its major branches. Reproductive: The uterus is unremarkable. No large adnexal mass. Other: There is a tiny fat containing umbilical hernia. The soft tissues and abdominal wall are otherwise unremarkable. Musculoskeletal: No suspicious osseous lesions. There are mild multilevel degenerative changes in the visualized spine. IMPRESSION: 1. There is a 3.0 x 4.6 cm pancreatic tail mass and multiple target like liver lesions, favoring primary pancreatic malignancy with liver  metastases. 2. There is fat stranding predominantly surrounding the second and third parts of duodenum and also extending around the pancreatic head/uncinate process. Findings favor duodenitis with secondary involvement of pancreatic head/uncinate process. Correlate clinically. 3. Multiple other nonacute observations, as described above. Electronically Signed   By: Ree Molt M.D.   On: 03/24/2023 16:01   DG Chest 2 View Result Date: 03/24/2023 CLINICAL DATA:  Chest pain EXAM: CHEST - 2 VIEW COMPARISON:  03/01/2023 FINDINGS: The heart size and mediastinal contours are within normal limits. Both lungs are clear. The visualized skeletal structures are unremarkable. IMPRESSION: No active cardiopulmonary disease. Electronically Signed   By: Oneil Devonshire M.D.   On: 03/24/2023 00:56   DG Chest 1 View Result Date: 03/01/2023 CLINICAL DATA:  Weakness. EXAM: CHEST  1 VIEW COMPARISON:  October 25, 2012. FINDINGS: The heart size and mediastinal contours are within normal limits. Both lungs are clear. The visualized skeletal structures are unremarkable. IMPRESSION: No active disease. Electronically Signed   By: Lynwood Landy Raddle M.D.   On: 03/01/2023 10:10   (Echo, Carotid, EGD, Colonoscopy, ERCP)    Subjective: Patient seen and examined in the morning rounds.  She was given soft food and tolerated well.  She did discuss her care with oncologist and wants to go home today with scheduled outpatient follow-up.  She has not required any pain medications since last 24 hours.   Discharge Exam: Vitals:   03/28/23 0406 03/28/23 0749  BP: (!) 155/89 (!) 149/90  Pulse: 78 78  Resp: 18 18  Temp: 99.1 F (37.3 C) 98.1 F (36.7 C)  SpO2: 97% 98%   Vitals:   03/27/23 1957 03/28/23 0406 03/28/23 0500 03/28/23 0749  BP: (!) 165/97 (!) 155/89  (!) 149/90  Pulse: 75 78  78  Resp: 18 18  18   Temp: 98.8 F (37.1 C) 99.1 F (37.3 C)  98.1 F (36.7 C)  TempSrc: Oral Oral  Oral  SpO2: 98% 97%  98%  Weight:   101.4  kg   Height:        General: Pt is alert, awake, not in acute distress Cardiovascular: RRR, S1/S2 +, no rubs, no gallops Respiratory: CTA bilaterally, no wheezing, no rhonchi Abdominal: Soft, NT, ND, bowel sounds + Extremities: no edema, no cyanosis    The results of significant diagnostics from this hospitalization (including imaging, microbiology, ancillary and laboratory) are listed below for reference.     Microbiology: No results found for this or any previous visit (from the past 240 hours).   Labs: BNP (last 3 results) No results for input(s): BNP in the last 8760 hours. Basic Metabolic Panel: Recent Labs  Lab 03/24/23 0038 03/25/23 0644 03/26/23 0658 03/27/23 0546  NA 136 139 139 139  K 3.3* 3.2* 3.1* 3.6  CL 101 105 106 106  CO2 19* 24 22 20*  GLUCOSE 207* 126* 136* 119*  BUN 9 5* <5* <5*  CREATININE 0.72 0.58 0.59 0.53  CALCIUM  10.1 8.9 8.6* 8.7*  MG  --   --   --  1.8  PHOS  --   --   --  1.9*   Liver Function Tests: Recent Labs  Lab 03/24/23 1127 03/25/23 0644 03/27/23 0546  AST 34 24 21  ALT 29 22 17   ALKPHOS 81 71 81  BILITOT 1.0 1.1 1.2  PROT 7.5 6.7 6.7  ALBUMIN 4.2 3.8 3.3*   Recent Labs  Lab 03/24/23 1127 03/25/23 0644 03/27/23 0546  LIPASE 1,232* 262* 55*   No results for input(s): AMMONIA in the last 168 hours. CBC: Recent Labs  Lab 03/24/23 0038 03/25/23 0644 03/27/23 0546  WBC 9.4 7.9 5.5  NEUTROABS  --   --  3.0  HGB 13.9 13.5 12.2  HCT 39.7 38.3 35.2*  MCV 91.1 90.1 91.2  PLT 229 181 181   Cardiac Enzymes: No results for input(s): CKTOTAL, CKMB, CKMBINDEX, TROPONINI in the last 168 hours. BNP: Invalid input(s): POCBNP CBG: Recent Labs  Lab 03/27/23 1636 03/27/23 2016 03/28/23 0021 03/28/23 0408 03/28/23 1223  GLUCAP 91 100* 101* 112* 100*   D-Dimer No results for input(s): DDIMER in the last 72 hours. Hgb A1c No results for input(s): HGBA1C in the last 72 hours. Lipid Profile No  results for input(s): CHOL, HDL, LDLCALC, TRIG, CHOLHDL, LDLDIRECT in the last 72 hours. Thyroid function studies No results for input(s): TSH, T4TOTAL, T3FREE, THYROIDAB in the last 72 hours.  Invalid input(s): FREET3 Anemia work up No results for input(s): VITAMINB12, FOLATE, FERRITIN, TIBC, IRON, RETICCTPCT in the last 72 hours. Urinalysis    Component Value Date/Time   COLORURINE YELLOW 03/01/2023 0902   APPEARANCEUR HAZY (A) 03/01/2023 0902   LABSPEC 1.023 03/01/2023 0902   PHURINE 5.0 03/01/2023 0902   GLUCOSEU >=500 (A) 03/01/2023 0902   GLUCOSEU NEGATIVE 08/18/2021 0853   HGBUR NEGATIVE 03/01/2023 0902   BILIRUBINUR NEGATIVE 03/01/2023 0902   KETONESUR 80 (A) 03/01/2023 0902   PROTEINUR 30 (A) 03/01/2023 0902   UROBILINOGEN 1.0 08/18/2021 0853   NITRITE NEGATIVE 03/01/2023 0902   LEUKOCYTESUR TRACE (A) 03/01/2023 0902   Sepsis Labs Recent Labs  Lab 03/24/23 0038 03/25/23 0644 03/27/23 0546  WBC 9.4 7.9 5.5   Microbiology No results found for this or any previous visit (from the past 240 hours).   Time coordinating discharge: 35 minutes  SIGNED:   Renato Applebaum, MD  [facility] 03/28/2023, 5:03 PM

## 2023-03-28 NOTE — Consult Note (Addendum)
 [facility] CONSULT NOTE  Patient Care Team: Jesus Bernardino MATSU, MD as PCP - General (Internal Medicine) Nahser, Aleene PARAS, MD as PCP - Cardiology (Cardiology) Lorence Ozell CROME, MD (Inactive) as Consulting Physician (Obstetrics and Gynecology) Eda Baxter, MD as Referring Physician (Dermatology)  CHIEF COMPLAINTS/PURPOSE OF CONSULTATION:  New pancreatic cancer  REFERRING PHYSICIAN: Dr. Raenelle  HISTORY OF PRESENTING ILLNESS:  Debbie [name] 53 y.o. female who presented to the ED on 03/24/2023 complaining of nausea, vomiting, abdominal pain.  Patient had been recently diagnosed with diabetes.  Reports she had seen her primary care physician in mid December because she was constantly thirsty, dry mouth, and fatigue.  She had just returned from a cruise and was not sure if this was all related.  Her PCP checked her her hemoglobin A1c and noted it was significantly high at 11.1 with previous A1c of 5.25 Jul 2022. She was started on metformin  and insulin  however unable to take that insulin  as her insurance company denied the insulin .  Patient notes that she came to the ED mid December because she noticed she was slurring her speech.  Admits to very poor appetite with weight loss of approximately 20 pounds.  Admits to abdominal pain and back pain.  Reports her blood sugar was 586 at that time and thought that everything was related to her recently diagnosed diabetes.  Medical history includes hypertension, hypercholesteremia, polycystic ovarian syndrome. Surgical history includes endometrial ablation done 8 years ago, no other significant surgeries reported. Family history significant for maternal grandmother with unknown cancer with mets, however patient remembers she always complained of abdominal pain.  Patient's maternal great grandmother had breast cancer. 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   I have reviewed her chart and materials  related to her cancer extensively and collaborated history with the patient. Summary of oncologic history is as follows: Oncology History   No history exists.    ASSESSMENT & PLAN:  New pancreatic cancer with liver metastases, possible lung mets Acute pancreatitis -CT of the abdomen pelvis done 03/24/2023 shows 3.0 x 4.6 cm pancreatic tail mass with multiple target-like liver lesions favoring primary pancreatic malignancy with liver metastases. - CT of chest done 03/27/2023 shows nodule right lower lobe 8 x 4 mm, worrisome for malignancy. - Tumor markers show extremely elevated CA 19-9 - Status post EUS with biopsy done 03/27/2023, path pending - Recommend consider biopsying a liver lesion to confirm metastatic disease as this will make a big difference in the prognosis and treatment options.  - Medical oncology/Dr. Cloretta following closely  2.  Diabetes - Recently diagnosed December 2024 by PCP. - Was started on metformin  at that time with recommendations for insulin . - Continue insulin  per protocol as inpatient  3.  Hypertension Hyperlipidemia - Administer antihypertensives and statin as ordered - Monitor BP and liver enzymes closely -Medicine following  Orders Placed This Encounter  Procedures   Procedural/ Surgical Case Request: ENDOSCOPIC RETROGRADE CHOLANGIOPANCREATOGRAPHY (ERCP)    Standing Status:   Standing    Number of Occurrences:   1    Pre-op diagnosis:   Pancreatic tail mass   UPPER ENDOSCOPIC ULTRASOUND    Standing Status:   Standing    Number of Occurrences:   1   DG Chest 2 View    If patient pregnant, contact provider.    Standing Status:   Standing    Number of Occurrences:   1    Reason for Exam (  SYMPTOM  OR DIAGNOSIS REQUIRED):   chest pain / emesis    Radiology Contrast Protocol - do NOT remove file path:   [email or web address]   CT ABDOMEN PELVIS W CONTRAST    Standing Status:   Standing    Number of  Occurrences:   1    Does the patient have a contrast media/X-ray dye allergy?:   No    If indicated for the ordered procedure, I authorize the administration of contrast media per Radiology protocol:   Yes    If indicated for the ordered procedure, I authorize the administration of oral contrast media per Radiology protocol:   Yes   CT CHEST WO CONTRAST    Standing Status:   Standing    Number of Occurrences:   1   Basic metabolic panel    Standing Status:   Standing    Number of Occurrences:   1   CBC    Standing Status:   Standing    Number of Occurrences:   1   Hepatic function panel    Standing Status:   Standing    Number of Occurrences:   1   Lipase, blood    Standing Status:   Standing    Number of Occurrences:   1   Creatinine, serum    while on enoxaparin  therapy    Standing Status:   Standing    Number of Occurrences:   3   Comprehensive metabolic panel    Standing Status:   Standing    Number of Occurrences:   1   CBC    Standing Status:   Standing    Number of Occurrences:   1   Glucose, capillary    Standing Status:   Standing    Number of Occurrences:   1   Glucose, capillary    Standing Status:   Standing    Number of Occurrences:   1   Glucose, capillary    Standing Status:   Standing    Number of Occurrences:   1   Glucose, capillary    Standing Status:   Standing    Number of Occurrences:   1   Lipase, blood    Standing Status:   Standing    Number of Occurrences:   1   Basic metabolic panel    Standing Status:   Standing    Number of Occurrences:   1   Cancer antigen 19-9    Standing Status:   Standing    Number of Occurrences:   1   Glucose, capillary    Standing Status:   Standing    Number of Occurrences:   1   Glucose, capillary    Standing Status:   Standing    Number of Occurrences:   1   Glucose, capillary    Standing Status:   Standing    Number of Occurrences:   1   Glucose, capillary    Standing Status:   Standing    Number of  Occurrences:   1   Glucose, capillary    Standing Status:   Standing    Number of Occurrences:   1   Glucose, capillary    Standing Status:   Standing    Number of Occurrences:   1   Cancer antigen 19-9    Standing Status:   Standing    Number of Occurrences:   1   Comprehensive metabolic panel    Standing Status:   Standing  Number of Occurrences:   1   CBC with Differential/Platelet    Standing Status:   Standing    Number of Occurrences:   1   Magnesium     Standing Status:   Standing    Number of Occurrences:   1   Phosphorus    Standing Status:   Standing    Number of Occurrences:   1   Lipase, blood    Standing Status:   Standing    Number of Occurrences:   1   Glucose, capillary    Standing Status:   Standing    Number of Occurrences:   1   Glucose, capillary    Standing Status:   Standing    Number of Occurrences:   1   Glucose, capillary    Standing Status:   Standing    Number of Occurrences:   1   Glucose, capillary    Standing Status:   Standing    Number of Occurrences:   1   Glucose, capillary    Standing Status:   Standing    Number of Occurrences:   1   Glucose, capillary    Standing Status:   Standing    Number of Occurrences:   1   Glucose, capillary    Standing Status:   Standing    Number of Occurrences:   1   Glucose, capillary    Standing Status:   Standing    Number of Occurrences:   1   Glucose, capillary    Standing Status:   Standing    Number of Occurrences:   1   Glucose, capillary    Standing Status:   Standing    Number of Occurrences:   1   Comprehensive metabolic panel    Standing Status:   Standing    Number of Occurrences:   1   Glucose, capillary    Standing Status:   Standing    Number of Occurrences:   1   DIET SOFT Room service appropriate? Yes; Fluid consistency: Thin    Standing Status:   Standing    Number of Occurrences:   1    Room service appropriate?:   Yes    Fluid consistency::   Thin   Diet - low sodium  heart healthy   Diet Carb Modified   Document Height and Actual Weight    Use scales to weigh patient, not stated or estimated weight.    Standing Status:   Standing    Number of Occurrences:   1   Vital signs    Standing Status:   Standing    Number of Occurrences:   1   Notify physician (specify)    Standing Status:   Standing    Number of Occurrences:   20    Notify Physician:   for pulse less than 55 or greater than 120    Notify Physician:   for respiratory rate less than 12 or greater than 25    Notify Physician:   for temperature greater than 100.5 F    Notify Physician:   for urinary output less than 30 mL/hr for four hours    Notify Physician:   for systolic BP less than 90 or greater than 160, diastolic BP less than 60 or greater than 100    Notify Physician:   for new hypoxia w/ oxygen saturations < 88%   Mobility Protocol: No Restrictions    RN to initiate protocols based on  patient's level of care    Standing Status:   Standing    Number of Occurrences:   1   Refer to Sidebar Report Refer to ICU, Med-Surg, Progressive, and Step-Down Mobility Protocol Sidebars    Refer to ICU, Med-Surg, Progressive, and Step-Down Mobility Protocol Sidebars    Standing Status:   Standing    Number of Occurrences:   1   Do not place and if present remove PureWick    Standing Status:   Standing    Number of Occurrences:   1   Initiate Oral Care Protocol    Standing Status:   Standing    Number of Occurrences:   1   Initiate Carrier Fluid Protocol    Standing Status:   Standing    Number of Occurrences:   1   RN may order General Admission PRN Orders utilizing General Admission PRN medications (through manage orders) for the following patient needs: allergy symptoms (Claritin ), cold sores (Carmex), cough (Robitussin DM), eye irritation (Liquifilm Tears), hemorrhoids (Tucks), indigestion (Maalox), minor skin irritation (Hydrocortisone  Cream), muscle pain (Ben Gay), nose irritation (saline  nasal spray) and sore throat (Chloraseptic spray).    Standing Status:   Standing    Number of Occurrences:   [id number]   Maintain IV access    Standing Status:   Standing    Number of Occurrences:   1   Daily weights    Standing Status:   Standing    Number of Occurrences:   1   Intake and Output    Standing Status:   Standing    Number of Occurrences:   1   Apply Diabetes Mellitus Care Plan    Standing Status:   Standing    Number of Occurrences:   1   STAT CBG when hypoglycemia is suspected. If treated, recheck every 15 minutes after each treatment until CBG >/= 70 mg/dl    Standing Status:   Standing    Number of Occurrences:   1   Refer to Hypoglycemia Protocol Sidebar Report for treatment of CBG < 70 mg/dl    Standing Status:   Standing    Number of Occurrences:   1   Pre-admission testing diagnosis    Standing Status:   Standing    Number of Occurrences:   1    Diagnosis:   Encounter for other preprocedural examination [Z01.818]   Advance diet as tolerated    Standing Status:   Standing    Number of Occurrences:   1    Advance from:   Diet full liquid    Advance to:   Diet heart healthy    Fluid consistency::   Thin   Increase activity slowly   Full code    Standing Status:   Standing    Number of Occurrences:   1    By::   Consent: discussion documented in EHR   Consult to hospitalist    Standing Status:   Standing    Number of Occurrences:   1    Place call to::   [facility]    Reason for Consult:   Admit   Oxygen therapy Mode or (Route): Nasal cannula; Liters Per Minute: 2; Keep O2 saturation between: greater than 92 %    Standing Status:   Standing    Number of Occurrences:   1    Mode or (Route):   Nasal cannula    Liters Per Minute:   2    Keep  O2 saturation between:   greater than 92 %   CBG monitoring, ED    Standing Status:   Standing    Number of Occurrences:   1   CBG monitoring, ED    Standing Status:   Standing    Number of Occurrences:   1    ED EKG    Standing Status:   Standing    Number of Occurrences:   1    Reason for Exam:   Chest Pain   EKG 12-Lead    Standing Status:   Standing    Number of Occurrences:   1   EKG    Standing Status:   Standing    Number of Occurrences:   1   Place in observation (patient's expected length of stay will be less than 2 midnights)    Standing Status:   Standing    Number of Occurrences:   1    Hospital Area:   [facility] [[id number]]    Level of Care:   Med-Surg [16]    May place patient in observation at [facility] or [facility] if equivalent level of care is available::   No    Covid Evaluation:   Asymptomatic - no recent exposure (last 10 days) testing not required    Diagnosis:   Acute pancreatitis [577.0.ICD-9-CM]    Admitting Physician:   MELVIN, ALEXANDER B [[id number]]    Attending Physician:   MELVIN, ALEXANDER B [[id number]]   Admit to Inpatient (patient's expected length of stay will be greater than 2 midnights or inpatient only procedure)    Standing Status:   Standing    Number of Occurrences:   1    Hospital Area:   [facility] [[id number]]    Level of Care:   Med-Surg [16]    Covid Evaluation:   Asymptomatic - no recent exposure (last 10 days) testing not required    Diagnosis:   Acute pancreatitis [577.0.ICD-9-CM]    Admitting Physician:   MELVIN, ALEXANDER B [[id number]]    Attending Physician:   ADHIKARI, AMRIT [[id number]]    Certification::   I certify this patient will need inpatient services for at least 2 midnights     MEDICAL HISTORY:  Past Medical History:  Diagnosis Date   Acute renal failure (ARF) (HCC) 10/01/2015   Allergy    Depression    DKA, type 2 (HCC) 03/01/2023   Elevated cholesterol    History of endometrial ablation 05/20/2021   2021   Hyperlipidemia    Hypertension    Hypertriglyceridemia 02/08/2022   No components found for: TG Lab Results Component Value Date/Time  TRIG 125.0 08/18/2021 08:53 AM  TRIG 163.0  (H) 12/29/2020 09:20 AM  TRIG 148.0 08/04/2020 08:24 AM  TRIG 177.0 (H) 04/03/2019 07:30 AM     Hypothyroidism    Insomnia 11/08/2021   Morbid obesity (HCC) 02/08/2019   New onset type 2 diabetes mellitus (HCC) 02/23/2023   Type 2 Diabetes Mellitus (E11.9) Onset: 02/23/2023 Key Events:  Initial A1c: 11.1% Presenting with polydipsia Current Treatment: Initiating Metformin  and Mounjaro  Monitoring: Blood glucose monitoring TID Follow-up in 2 weeks Quality Metrics: Eye exam needed Diabetes education pending Tags: #NewProblem #RequiresFollowUp #PatientEducation     Seasonal allergies    Sleep apnea    wears cpap    Thyroid disease    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 Fleeta Smock     SURGICAL HISTORY: Past Surgical  History:  Procedure Laterality Date   ABLATION  10/2019   ADENOIDECTOMY     COLONOSCOPY     DENTAL SURGERY     graft of mouth     SOCIAL HISTORY: Social History   Socioeconomic History   Marital status: Married    Spouse name: Not on file   Number of children: 0   Years of education: Not on file   Highest education level: Some college, no degree  Occupational History   Not on file  Tobacco Use   Smoking status: Never   Smokeless tobacco: Never  Vaping Use   Vaping status: Never Used  Substance and Sexual Activity   Alcohol use: No   Drug use: No   Sexual activity: Not on file  Other Topics Concern   Not on file  Social History Narrative   Not on file   Social Drivers of Health   Financial Resource Strain: Low Risk  (03/15/2023)   Overall Financial Resource Strain (CARDIA)    Difficulty of Paying Living Expenses: Not hard at all  Food Insecurity: No Food Insecurity (03/24/2023)   Hunger Vital Sign    Worried About Running Out of Food in the Last Year: Never true    Ran Out of Food in the Last Year: Never true  Transportation Needs: No Transportation Needs (03/24/2023)   PRAPARE -  Administrator, Civil Service (Medical): No    Lack of Transportation (Non-Medical): No  Physical Activity: Insufficiently Active (03/15/2023)   Exercise Vital Sign    Days of Exercise per Week: 5 days    Minutes of Exercise per Session: 10 min  Stress: No Stress Concern Present (03/15/2023)   [employer] of Occupational Health - Occupational Stress Questionnaire    Feeling of Stress : Not at all  Social Connections: Socially Integrated (03/24/2023)   Social Connection and Isolation Panel [NHANES]    Frequency of Communication with Friends and Family: More than three times a week    Frequency of Social Gatherings with Friends and Family: Once a week    Attends Religious Services: More than 4 times per year    Active Member of [employer] or Organizations: Yes    Attends Engineer, Structural: More than 4 times per year    Marital Status: Married  Catering Manager Violence: Not At Risk (03/24/2023)   Humiliation, Afraid, Rape, and Kick questionnaire    Fear of Current or Ex-Partner: No    Emotionally Abused: No    Physically Abused: No    Sexually Abused: No    FAMILY HISTORY: Family History  Problem Relation Age of Onset   Heart disease Father    Hypertension Mother    Hyperlipidemia Mother    Breast cancer Maternal Grandmother    Prostate cancer Maternal Grandfather    Diabetes Maternal Grandfather    CAD Maternal Grandfather    Colon cancer Neg Hx    Colon polyps Neg Hx    Esophageal cancer Neg Hx    Stomach cancer Neg Hx    Rectal cancer Neg Hx     REVIEW OF SYSTEMS:   Constitutional: Denies fevers, chills or abnormal night sweats Eyes: Denies blurriness of vision, double vision or watery eyes Ears, nose, mouth, throat, and face: Denies mucositis or sore throat Respiratory: Denies cough, dyspnea or wheezes Cardiovascular: Denies palpitation, chest discomfort or lower extremity swelling Gastrointestinal: +Abdominal pain, denies nausea, heartburn or  change in bowel habits Skin: Denies abnormal skin rashes Lymphatics:  Denies new lymphadenopathy or easy bruising Neurological: Denies numbness, tingling or new weaknesses Behavioral/Psych: Mood is stable, no new changes  All other systems were reviewed with the patient and are negative.  PHYSICAL EXAMINATION: ECOG PERFORMANCE STATUS: 1 - Symptomatic but completely ambulatory  Vitals:   03/28/23 0406 03/28/23 0749  BP: (!) 155/89 (!) 149/90  Pulse: 78 78  Resp: 18 18  Temp: 99.1 F (37.3 C) 98.1 F (36.7 C)  SpO2: 97% 98%   Filed Weights   03/25/23 0500 03/26/23 0500 03/28/23 0500  Weight: 236 lb 12.4 oz (107.4 kg) 235 lb 10.8 oz (106.9 kg) 223 lb 8.7 oz (101.4 kg)    GENERAL: alert, no distress and comfortable SKIN: skin color, texture, turgor are normal, no rashes or significant lesions EYES: normal, conjunctiva are pink and non-injected, sclera clear OROPHARYNX: no exudate, no erythema and lips, buccal mucosa, and tongue normal  NECK: supple, thyroid normal size, non-tender, without nodularity LYMPH: no palpable lymphadenopathy in the cervical, axillary or inguinal LUNGS: clear to auscultation and percussion with normal breathing effort HEART: regular rate & rhythm and no murmurs and no lower extremity edema ABDOMEN: abdomen soft, +tender and normal bowel sounds MUSCULOSKELETAL: no cyanosis of digits and no clubbing  PSYCH: alert & oriented x 3 with fluent speech NEURO: no focal motor/sensory deficits   ALLERGIES:  is allergic to atorvastatin.  MEDICATIONS:  Current Facility-Administered Medications  Medication Dose Route Frequency Provider Last Rate Last Admin   acetaminophen  (TYLENOL ) tablet 650 mg  650 mg Oral Q6H PRN Seena Marsa NOVAK, MD   650 mg at 03/24/23 1826   Or   acetaminophen  (TYLENOL ) suppository 650 mg  650 mg Rectal Q6H PRN Seena Marsa NOVAK, MD       albuterol  (PROVENTIL ) (2.5 MG/3ML) 0.083% nebulizer solution 2.5 mg  2.5 mg Nebulization Q6H PRN  Merilee Linsey I, RPH       enoxaparin  (LOVENOX ) injection 40 mg  40 mg Subcutaneous Q24H Melvin, Alexander B, MD   40 mg at 03/27/23 1617   HYDROmorphone  (DILAUDID ) injection 0.5 mg  0.5 mg Intravenous Q3H PRN Seena Marsa NOVAK, MD   0.5 mg at 03/25/23 2326   insulin  aspart (novoLOG ) injection 0-15 Units  0-15 Units Subcutaneous Q4H Melvin, Alexander B, MD   2 Units at 03/27/23 0530   levothyroxine  (SYNTHROID ) tablet 75 mcg  75 mcg Oral Q0600 Melvin, Alexander B, MD   75 mcg at 03/28/23 0507   losartan  (COZAAR ) tablet 50 mg  50 mg Oral Daily Mitchell, Madeline I, RPH   50 mg at 03/28/23 1047   montelukast  (SINGULAIR ) tablet 10 mg  10 mg Oral QPM Melvin, Alexander B, MD   10 mg at 03/27/23 1738   ondansetron  (ZOFRAN ) tablet 4 mg  4 mg Oral Q6H PRN Seena Marsa NOVAK, MD       Or   ondansetron  (ZOFRAN ) injection 4 mg  4 mg Intravenous Q6H PRN Seena Marsa NOVAK, MD       pantoprazole  (PROTONIX ) EC tablet 40 mg  40 mg Oral BID Mansouraty, Gabriel Jr., MD   40 mg at 03/28/23 1047   polyethylene glycol (MIRALAX  / GLYCOLAX ) packet 17 g  17 g Oral Daily PRN Seena Marsa NOVAK, MD       potassium & sodium phosphates  (PHOS-NAK) 280-160-250 MG packet 1 packet  1 packet Oral TID WC & HS Ghimire, Kuber, MD   1 packet at 03/28/23 1047   rosuvastatin  (CRESTOR ) tablet 20 mg  20 mg Oral Daily  Seena Marsa NOVAK, MD   20 mg at 03/28/23 1047   sodium chloride  flush (NS) 0.9 % injection 3 mL  3 mL Intravenous Q12H Seena Marsa NOVAK, MD   3 mL at 03/27/23 2218     LABORATORY DATA:  I have reviewed the data as listed Lab Results  Component Value Date   WBC 5.5 03/27/2023   HGB 12.2 03/27/2023   HCT 35.2 (L) 03/27/2023   MCV 91.2 03/27/2023   PLT 181 03/27/2023   Recent Labs    03/24/23 1127 03/25/23 0644 03/26/23 0658 03/27/23 0546  NA  --  139 139 139  K  --  3.2* 3.1* 3.6  CL  --  105 106 106  CO2  --  24 22 20*  GLUCOSE  --  126* 136* 119*  BUN  --  5* <5* <5*  CREATININE  --  0.58  0.59 0.53  CALCIUM   --  8.9 8.6* 8.7*  GFRNONAA  --  >60 >60 >60  PROT 7.5 6.7  --  6.7  ALBUMIN 4.2 3.8  --  3.3*  AST 34 24  --  21  ALT 29 22  --  17  ALKPHOS 81 71  --  81  BILITOT 1.0 1.1  --  1.2  BILIDIR 0.1  --   --   --   IBILI 0.9  --   --   --     RADIOGRAPHIC STUDIES: I have personally reviewed the radiological images as listed and agreed with the findings in the report. CT CHEST WO CONTRAST Result Date: 03/27/2023 CLINICAL DATA:  Occult malignancy EXAM: CT CHEST WITHOUT CONTRAST TECHNIQUE: Multidetector CT imaging of the chest was performed following the standard protocol without IV contrast. RADIATION DOSE REDUCTION: This exam was performed according to the departmental dose-optimization program which includes automated exposure control, adjustment of the mA and/or kV according to patient size and/or use of iterative reconstruction technique. COMPARISON:  CT chest 06/17/2020. CT abdomen and pelvis 03/24/2023. FINDINGS: Cardiovascular: No significant vascular findings. Normal heart size. No pericardial effusion. Mediastinum/Nodes: There is a right thyroid nodule measuring 12 mm. There are no enlarged mediastinal or hilar lymph nodes. Esophagus is within limits. There are 2 cystic areas in the right cardiophrenic angle which appear unchanged from 2022 favored as pericardial cyst. The largest measures up to 3 cm. Lungs/Pleura: New spiculated nodular density in the superior segment of the right lower lobe measures 8 x 4 mm on image 3/62. The lungs are otherwise clear. Upper Abdomen: Hepatic and pancreatic lesions are again seen and appear unchanged from CT abdomen and pelvis 03/23/1998. Musculoskeletal: No focal osseous lesion. Degenerative changes affect the spine. IMPRESSION: 1. New spiculated nodular density in the superior segment of the right lower lobe measuring 8 x 4 mm. Findings are worrisome for malignancy. 2. Stable cystic areas in the right cardiophrenic angle favored as  pericardial cysts. 3. Stable hepatic and pancreatic lesions. 4. Incidental right thyroid nodule measuring 1.2 cm. No follow-up imaging is recommended. Reference: J Am Coll Radiol. 2015 Feb;12(2): 143-50 Electronically Signed   By: Greig Pique M.D.   On: 03/27/2023 23:37   CT ABDOMEN PELVIS W CONTRAST Result Date: 03/24/2023 CLINICAL DATA:  Abdominal pain, acute, nonlocalized elevated lipase. * Tracking Code: BO * EXAM: CT ABDOMEN AND PELVIS WITH CONTRAST TECHNIQUE: Multidetector CT imaging of the abdomen and pelvis was performed using the standard protocol following bolus administration of intravenous contrast. RADIATION DOSE REDUCTION: This exam was performed according to  the departmental dose-optimization program which includes automated exposure control, adjustment of the mA and/or kV according to patient size and/or use of iterative reconstruction technique. CONTRAST:  75mL OMNIPAQUE  IOHEXOL  350 MG/ML SOLN COMPARISON:  CT scan abdomen and pelvis from 06/11/2021. FINDINGS: Lower chest: There are patchy atelectatic changes in the visualized lung bases. No overt consolidation. No pleural effusion. The heart is normal in size. No pericardial effusion. 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 Hepatobiliary: The liver is normal in size. Non-cirrhotic configuration. There are at least 10-12, ill-defined, hypoattenuating target like liver lesions with largest in the right hepatic lobe, segment 7 measuring up to 3.3 x 4.9 cm, compatible with metastases. No intrahepatic or extrahepatic bile duct dilation. No calcified gallstones. Normal gallbladder wall thickness. No pericholecystic inflammatory changes. Pancreas: There is heterogeneous, hypoattenuating mass centered in the pancreatic tail measuring up to 3.0 x 4.6 cm, highly concerning for pancreatic adenocarcinoma. 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 There is also fat stranding surrounding the pancreatic head/uncinate process and duodenum. The epicenter of the fat stranding appears to be centered more around duodenum and pancreas may be secondarily involved. Correlate clinically. Otherwise unremarkable pancreas. Main pancreatic duct is not dilated. Spleen: Within normal limits. No focal lesion. Adrenals/Urinary Tract: Adrenal glands are unremarkable. No suspicious renal mass. No hydronephrosis. No renal or ureteric calculi. Unremarkable urinary bladder. Stomach/Bowel: No disproportionate dilation of the small or large bowel loops. The appendix is unremarkable. There is mild circumferential and irregular thickening of the wall of second and third part of duodenum with surrounding fat stranding, concerning for duodenitis. No discrete duodenal ulcer seen. No pneumoperitoneum. There are multiple diverticula mainly in the left hemi colon, without imaging signs of diverticulitis. Vascular/Lymphatic: No ascites or pneumoperitoneum. No abdominal or pelvic lymphadenopathy, by size criteria. No aneurysmal dilation of the major abdominal arteries. There are mild peripheral atherosclerotic vascular calcifications of the aorta and its major branches. Reproductive: The uterus is unremarkable. No large adnexal mass. Other: There is a tiny fat containing umbilical hernia. The soft tissues and abdominal wall are otherwise unremarkable. Musculoskeletal: No suspicious osseous lesions. There are mild multilevel degenerative changes in the visualized spine. IMPRESSION: 1. 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 2. There is fat stranding predominantly surrounding the second and third parts of duodenum and also  extending around the pancreatic head/uncinate process. Findings favor duodenitis with secondary involvement of pancreatic head/uncinate process. Correlate clinically. 3. Multiple other nonacute observations, as described above. Electronically Signed   By: Ree Molt M.D.   On: 03/24/2023 16:01   DG Chest 2 View Result Date: 03/24/2023 CLINICAL DATA:  Chest pain EXAM: CHEST - 2 VIEW COMPARISON:  03/01/2023 FINDINGS: The heart size and mediastinal contours are within normal limits. Both lungs are clear. The visualized skeletal structures are unremarkable. IMPRESSION: No active cardiopulmonary disease. Electronically Signed   By: Oneil Devonshire M.D.   On: 03/24/2023 00:56   DG Chest 1 View Result Date: 03/01/2023 CLINICAL DATA:  Weakness. EXAM: CHEST  1 VIEW COMPARISON:  October 25, 2012. FINDINGS: The heart size and mediastinal contours are within normal limits. Both lungs are clear. The visualized  skeletal structures are unremarkable. IMPRESSION: No active disease. Electronically Signed   By: Lynwood Landy Raddle M.D.   On: 03/01/2023 10:10     The total time spent in the appointment was 40 minutes encounter with patients including review of chart and various tests results, discussions about plan of care and coordination of care plan   All questions were answered. The patient knows to call the clinic with any problems, questions or concerns. No barriers to learning was detected.  Olam JINNY Brunner, NP 1/7/20252:15 PM  Ms. Mooty was interviewed and examined.  I reviewed the CT images and EUS report.  She was admitted with acute abdominal pain, likely secondary to pancreatitis.  She appears to have metastatic pancreas cancer.  I discussed the probable diagnosis and treatment options with Ms. Iams and her husband.  She will follow-up at the Cancer center within the next week to review the pathology report and formulate a treatment plan.  Impression: 1.  Pancreas mass, liver lesions CT abdomen/pelvis  03/24/2023: 3 x 4.6 cm pancreas tail mass, multiple liver lesions consistent with metastases, fat stranding surrounding the second and third ports of the duodenum and pancreas head/uncinate  CT chest 03/27/2023: New spiculated nodular density in the right lower lobe, 8 x 4 mm EUS 03/27/2023: Pancreas tail mass, liver lesions, T2 N0 M1, needle biopsy of pancreas mass Elevated CA 19-9  2.  Diabetes 3.  Acute abdominal pain and nausea/vomiting 03/23/2023 secondary to pancreatitis 4.  Hypertension. 5.  Asthma 6.  Hypothyroidism 7.  Hyperlipidemia

## 2023-03-28 NOTE — Progress Notes (Signed)
 PROGRESS NOTE  SHELISE MARON  FMW:[id number] DOB: 1970/09/20 DOA: 03/24/2023 PCP: Jesus Bernardino MATSU, MD   Brief Narrative: Patient is a 53 year old female with history of hypertension, hyperlipidemia, hypothyroidism, diabetes, obesity who presented with nausea, vomiting, abdominal pain.  On presentation, she was hypertensive.  Lab work showed potassium of 3.3, lipase of 1232.  Patient admitted for the management of acute pancreatitis.  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  GI also consulted. Underwent EUS, biopsy pending.  Consistent with pancreatic adenocarcinoma with liver mets.  CT scan of the chest also showed right lower lobe 8 x 4 mm spiculated nodule.  Assessment & Plan:  Principal Problem:   Acute pancreatitis Active Problems:   Essential hypertension   Hyperlipidemia   Hypothyroidism   Morbid obesity (HCC)   Type 2 diabetes mellitus (HCC)   Asthma, chronic   Pancreatic mass   Gastritis and gastroduodenitis   Acute pancreatitis:  Likely secondary to pancreatic cancer.  Some clinical improvement now.  Treated with IV fluids.  Tolerating full liquid diet.   Advance to soft diet today.  Monitor.  Lipase normalizing.    Pancreatic mass.  Consistent with pancreatic adenocarcinoma with metastatic lesions. CT imaging shows  3.0 x 4.6 cm pancreatic tail mass and multiple target ike liver lesions, favoring primary pancreatic malignancy with liver metastases, possible duodenitis.  Seen by GI. EUS and biopsy 1/6, evidence of gastritis.  H. pylori pending Irregular mass pancreatic tail, hypoechoic.  Multiple liver mets.  Biopsy of the pancreatic mass pending. Called and discussed case with oncology, inpatient versus outpatient workup further.  Hypokalemia: Replaced.  Adequate. Hypophosphatemia: Replaced.  Hypertension: Hypertensive on  presentation.  Blood pressure better now.  Continue losartan .  Hyperlipidemia: Liver enzymes normal.  Continue statin  Hypothyroidism: Continue Synthyroid  Diabetes type 2: Recently diagnosed.  On insulin  at home.  Continues to be on insulin .  Asthma: Currently not on exacerbation.  Continue bronchodilators  as needed  Morbid obesity: BMI 40.6        DVT prophylaxis:enoxaparin  (LOVENOX ) injection 40 mg Start: 03/24/23 1430     Code Status: Full Code  Family Communication: None today.  Patient status: Inpatient.  Probable inpatient procedures.  Patient is from :Home  Anticipated discharge un:ynfz  Estimated DC date:after full work up.  Later today or tomorrow.   Consultants: GI  Procedures:None yet  Antimicrobials:  Anti-infectives (From admission, onward)    None       Subjective:  Patient seen and examined.  Tolerating full liquid diet.  Denies any nausea vomiting or abdominal pain.  Appropriately anxious about the findings.  Also discussed her about findings of right lower lobe spiculated nodule. Case discussed with oncology, they will see her in consultation before going home.  Objective: Vitals:   03/27/23 1957 03/28/23 0406 03/28/23 0500 03/28/23 0749  BP: (!) 165/97 (!) 155/89  (!) 149/90  Pulse: 75 78  78  Resp: 18 18  18   Temp: 98.8 F (37.1 C) 99.1 F (37.3 C)  98.1 F (36.7 C)  TempSrc: Oral Oral  Oral  SpO2: 98% 97%  98%  Weight:   101.4 kg   Height:        Intake/Output Summary (Last 24 hours) at 03/28/2023 1355 Last data filed at 03/27/2023 1403 Gross per 24 hour  Intake 500 ml  Output --  Net 500 ml  Filed Weights   03/25/23 0500 03/26/23 0500 03/28/23 0500  Weight: 107.4 kg 106.9 kg 101.4 kg    Examination:  General: Fairly comfortable.  On room air. Cardiovascular: S1-S2 normal.  Regular rate rhythm. Respiratory: Bilateral clear.  No added sounds. Gastrointestinal: Soft.  Nontender.  Bowel sounds present. Ext: No swelling  or edema. Neuro: Alert awake and oriented.  No focal neurological deficits.    Data Reviewed: I have personally reviewed following labs and imaging studies  CBC: Recent Labs  Lab 03/24/23 0038 03/25/23 0644 03/27/23 0546  WBC 9.4 7.9 5.5  NEUTROABS  --   --  3.0  HGB 13.9 13.5 12.2  HCT 39.7 38.3 35.2*  MCV 91.1 90.1 91.2  PLT 229 181 181   Basic Metabolic Panel: Recent Labs  Lab 03/24/23 0038 03/25/23 0644 03/26/23 0658 03/27/23 0546  NA 136 139 139 139  K 3.3* 3.2* 3.1* 3.6  CL 101 105 106 106  CO2 19* 24 22 20*  GLUCOSE 207* 126* 136* 119*  BUN 9 5* <5* <5*  CREATININE 0.72 0.58 0.59 0.53  CALCIUM  10.1 8.9 8.6* 8.7*  MG  --   --   --  1.8  PHOS  --   --   --  1.9*     No results found for this or any previous visit (from the past 240 hours).   Radiology Studies: CT CHEST WO CONTRAST Result Date: 03/27/2023 CLINICAL DATA:  Occult malignancy EXAM: CT CHEST WITHOUT CONTRAST TECHNIQUE: Multidetector CT imaging of the chest was performed following the standard protocol without IV contrast. RADIATION DOSE REDUCTION: This exam was performed according to the departmental dose-optimization program which includes automated exposure control, adjustment of the mA and/or kV according to patient size and/or use of iterative reconstruction technique. COMPARISON:  CT chest 06/17/2020. CT abdomen and pelvis 03/24/2023. FINDINGS: Cardiovascular: No significant vascular findings. Normal heart size. No pericardial effusion. Mediastinum/Nodes: There is a right thyroid nodule measuring 12 mm. There are no enlarged mediastinal or hilar lymph nodes. Esophagus is within limits. There are 2 cystic areas in the right cardiophrenic angle which appear unchanged from 2022 favored as pericardial cyst. The largest measures up to 3 cm. Lungs/Pleura: New spiculated nodular density in the superior segment of the right lower lobe measures 8 x 4 mm on image 3/62. The lungs are otherwise clear. Upper  Abdomen: Hepatic and pancreatic lesions are again seen and appear unchanged from CT abdomen and pelvis 03/23/1998. Musculoskeletal: No focal osseous lesion. Degenerative changes affect the spine. IMPRESSION: 1. New spiculated nodular density in the superior segment of the right lower lobe measuring 8 x 4 mm. Findings are worrisome for malignancy. 2. Stable cystic areas in the right cardiophrenic angle favored as pericardial cysts. 3. Stable hepatic and pancreatic lesions. 4. Incidental right thyroid nodule measuring 1.2 cm. No follow-up imaging is recommended. Reference: J Am Coll Radiol. 2015 Feb;12(2): 143-50 Electronically Signed   By: Greig Pique M.D.   On: 03/27/2023 23:37     Scheduled Meds:  enoxaparin  (LOVENOX ) injection  40 mg Subcutaneous Q24H   insulin  aspart  0-15 Units Subcutaneous Q4H   levothyroxine   75 mcg Oral Q0600   losartan   50 mg Oral Daily   montelukast   10 mg Oral QPM   pantoprazole   40 mg Oral BID   potassium & sodium phosphates   1 packet Oral TID WC & HS   rosuvastatin   20 mg Oral Daily   sodium chloride  flush  3 mL Intravenous Q12H  Continuous Infusions:     LOS: 3 days   Renato Applebaum, MD [facility] P1/09/2023, 1:55 PM

## 2023-03-28 NOTE — Plan of Care (Signed)

## 2023-03-29 ENCOUNTER — Encounter: Payer: Self-pay | Admitting: *Deleted

## 2023-03-29 ENCOUNTER — Telehealth: Payer: Self-pay

## 2023-03-29 ENCOUNTER — Encounter (HOSPITAL_COMMUNITY): Payer: Self-pay | Admitting: Gastroenterology

## 2023-03-29 ENCOUNTER — Encounter: Payer: Self-pay | Admitting: Gastroenterology

## 2023-03-29 LAB — CYTOLOGY - NON PAP

## 2023-03-29 NOTE — Progress Notes (Signed)
 PATIENT NAVIGATOR PROGRESS NOTE  Name: ROSHELL BRIGHAM Date: 03/29/2023 MRN: [id number]  DOB: 1970-03-28   Reason for visit:  Hospital F/U appt   Comments:  Called and spoke with Ms Beets and have her scheduled for hospital F/U appt for Friday, 03/31/23 with arrival time of 1 pm. We discussed what to expect at appt and directions to building and parking as well as a contact number to call with any questions    Time spent counseling/coordinating care: 30-45 minutes

## 2023-03-29 NOTE — Progress Notes (Signed)
 Foundation one testing requested on accession number [id number]

## 2023-03-29 NOTE — Transitions of Care (Post Inpatient/ED Visit) (Signed)
 03/29/2023  Name: Debbie [name] MRN: [id number] DOB: 08-Oct-1970  Today's TOC FU Call Status: Today's TOC FU Call Status:: Successful TOC FU Call Completed TOC FU Call Complete Date: 03/29/23 Patient's Name and Date of Birth confirmed.  Transition Care Management Follow-up Telephone Call Date of Discharge: 03/28/23 Discharge Facility: [facility]) Type of Discharge: Inpatient Admission Primary Inpatient Discharge Diagnosis:: pancreatitis How have you been since you were released from the hospital?: Better Any questions or concerns?: No  Items Reviewed: Did you receive and understand the discharge instructions provided?: Yes Medications obtained,verified, and reconciled?: Yes (Medications Reviewed) Any new allergies since your discharge?: No Dietary orders reviewed?: Yes Do you have support at home?: Yes People in Home: spouse  Medications Reviewed Today: Medications Reviewed Today     Reviewed by Emmitt Pan, LPN (Licensed Practical Nurse) on 03/29/23 at 1001  Med List Status: <None>   Medication Order Taking? Sig Documenting Provider Last Dose Status Informant  albuterol  (VENTOLIN  HFA) 108 (90 Base) MCG/ACT inhaler [id number] No 1 PUFF AS NEEDED EVERY 4 HOURS FOR COUGH/WHEEZE INHALATION 90 DAYS [provider] Past Month Active Self, Pharmacy Records  Albuterol -Budesonide (AIRSUPRA ) 90-80 MCG/ACT AERO [id number] No Inhale 2 Inhalations into the lungs every 4 (four) hours as needed. Replaces albuterol , rescue inhaler Jesus Bernardino MATSU, MD Past Month Active Self, Pharmacy Records           Med Note [facility], [place] A   Fri Mar 24, 2023  3:26 PM)    Ascorbic Acid (VITAMIN C) 1000 MG tablet [id number] No Take 1,000 mg by mouth daily. [provider] 03/23/2023 Active Self, Pharmacy Records  Azelastine -Fluticasone  137-50 MCG/ACT SUSP [id number] No PLACE 1 SPRAY INTO THE NOSE EVERY 12 (TWELVE) HOURS. Kip Ade, NP 03/23/2023 Active Self, Pharmacy Records   Blood Glucose Monitoring Suppl DEVI [id number] No 1 each by Does not apply route in the morning, at noon, and at bedtime. May substitute to any manufacturer covered by patient's insurance. Raenelle Donalda HERO, MD Taking Active Self, Pharmacy Records  Cholecalciferol (D3-1000 PO) [id number] No Take 1,000 Units by mouth daily. [provider] 03/23/2023 Active Self, Pharmacy Records  Continuous Glucose Sensor (DEXCOM G7 SENSOR) MISC [id number] No 1 Act by Does not apply route daily. Jesus Bernardino MATSU, MD Taking Active Self, Pharmacy Records  Cyanocobalamin (VITAMIN B12 PO) [id number] No Take by mouth daily. [provider] 03/23/2023 Active Self, Pharmacy Records  escitalopram  (LEXAPRO ) 20 MG tablet [id number] No TAKE 1 TABLET BY MOUTH EVERY DAY Jesus Bernardino MATSU, MD 03/23/2023 Active Self, Pharmacy Records  Glucose Blood (BLOOD GLUCOSE TEST STRIPS) STRP [id number] No 1 each by In Vitro route in the morning, at noon, and at bedtime. May substitute to any manufacturer covered by patient's insurance. Raenelle Donalda HERO, MD Taking Active Self, Pharmacy Records  ibuprofen (ADVIL) 600 MG tablet [id number] No Take 600 mg by mouth every 8 (eight) hours as needed for cramping, moderate pain, mild pain, headache or fever. [provider] Taking Active Self, Pharmacy Records  insulin  glargine (LANTUS ) 100 UNIT/ML Solostar Pen [id number] No Inject 20 Units into the skin daily. Raenelle Donalda HERO, MD 03/23/2023 Active Self, Pharmacy Records  Insulin  Pen Needle 32G X 4 MM MISC [id number] No 1 each 4 (four) times daily. Raenelle Donalda HERO, MD Taking Active Self, Pharmacy Records  Lancet Device MISC [id number] No 1 each by Does not apply route in the morning, at noon, and at bedtime. May substitute to any manufacturer covered by patient's insurance. Ghimire,  Donalda HERO, MD Taking Active Self, Pharmacy Records  Lancets Misc. MISC [id number] No 1 each by Does not apply route in the morning, at noon, and at bedtime. May  substitute to any manufacturer covered by patient's insurance. Raenelle Donalda HERO, MD Taking Active Self, Pharmacy Records  levocetirizine (XYZAL ) 5 MG tablet [id number] No Take 1 tablet (5 mg total) by mouth every evening. Jesus Bernardino MATSU, MD 03/23/2023 Active Self, Pharmacy Records  levothyroxine  (SYNTHROID ) 75 MCG tablet [id number] No Take 1 tablet (75 mcg total) by mouth daily. Jesus Bernardino MATSU, MD 03/23/2023 Active Self, Pharmacy Records  losartan -hydrochlorothiazide  [facility]) 50-12.5 MG tablet [id number] No TAKE 1 TABLET BY MOUTH DAILY. OK TO START WITH JUST HALF TABLET DAILY FOR WEEK 1 AND LEAVE IT THERE IF GOAL OF 140/90 REACHED.  Patient taking differently: Take 1 tablet by mouth daily.   Jesus Bernardino MATSU, MD 03/23/2023 Active Self, Pharmacy Records  metFORMIN  (GLUCOPHAGE ) 500 MG tablet [id number] No Take 2 tablets (1,000 mg total) by mouth 2 (two) times daily with a meal. Replaces 500 mg twice daily dosing for better sugar control Jesus Bernardino MATSU, MD 03/23/2023 Active Self, Pharmacy Records  montelukast  (SINGULAIR ) 10 MG tablet [id number] No Take 1 tablet by mouth every evening. [provider] 03/23/2023 Active Self, Pharmacy Records           Med Note MINGO, DELON CROME   Thu Oct 01, 2015  7:34 PM)    ondansetron  (ZOFRAN ) 4 MG tablet [id number]  Take 1 tablet (4 mg total) by mouth every 6 (six) hours as needed for nausea. Raenelle Coria, MD  Active   oxyCODONE  (ROXICODONE ) 5 MG immediate release tablet [id number]  Take 1 tablet (5 mg total) by mouth every 4 (four) hours as needed for up to 5 days for severe pain (pain score 7-10) or moderate pain (pain score 4-6). Raenelle Coria, MD  Active   pantoprazole  (PROTONIX ) 40 MG tablet [id number]  Take 1 tablet (40 mg total) by mouth 2 (two) times daily. Raenelle Coria, MD  Active   rosuvastatin  (CRESTOR ) 20 MG tablet [id number] No TAKE 1 TABLET BY MOUTH EVERY DAY Jesus Bernardino MATSU, MD 03/23/2023 Active Self, Pharmacy Records  Saline (SIMPLY SALINE) 0.9 %  AERS [id number] No Place 2 each into the nose as directed. Use nightly for sinus hygiene long-term.  Can also be used as many times daily as desired to assist with clearing congested sinuses. Jesus Bernardino MATSU, MD Past Week Active Self, Pharmacy Records  tirzepatide  (MOUNJARO ) 5 MG/0.5ML Pen [id number] No Inject 5 mg into the skin once a week. Replaces Wegovy .  Move straight to this from Wegovy  0.5 mg Jesus Bernardino MATSU, MD 03/23/2023 Active Self, Pharmacy Records  topiramate  (TOPAMAX ) 25 MG tablet [id number] No TAKE 1 TABLET (25 MG TOTAL) BY MOUTH 2 (TWO) TIMES DAILY. TAKE JUST ONE TABLET DAILY FOR THE FIRST WEEK. Jesus Bernardino MATSU, MD 03/23/2023 Active Self, Pharmacy Records  Zinc 50 MG TABS [id number] No Take by mouth daily. [provider] 03/23/2023 Active Self, Pharmacy Records            Home Care and Equipment/Supplies: Were Home Health Services Ordered?: NA Any new equipment or medical supplies ordered?: NA  Functional Questionnaire: Do you need assistance with bathing/showering or dressing?: No Do you need assistance with meal preparation?: No Do you need assistance with eating?: No Do you have difficulty maintaining continence: No Do you need assistance with getting out of bed/getting out of a chair/moving?: No Do you have difficulty  managing or taking your medications?: No  Follow up appointments reviewed: PCP Follow-up appointment confirmed?: Yes Date of PCP follow-up appointment?: 03/30/23 Follow-up Provider: [facility] Follow-up appointment confirmed?: No Reason Specialist Follow-Up Not Confirmed: Patient has Specialist Provider Number and will Call for Appointment Do you need transportation to your follow-up appointment?: No Do you understand care options if your condition(s) worsen?: Yes-patient verbalized understanding    SIGNATURE Julian Lemmings, LPN [facility] Nurse Health Advisor Direct Dial [phone]

## 2023-03-30 ENCOUNTER — Other Ambulatory Visit: Payer: Self-pay | Admitting: *Deleted

## 2023-03-30 ENCOUNTER — Ambulatory Visit: Payer: 59 | Admitting: Internal Medicine

## 2023-03-30 ENCOUNTER — Telehealth: Payer: Self-pay | Admitting: *Deleted

## 2023-03-30 ENCOUNTER — Encounter: Payer: Self-pay | Admitting: Internal Medicine

## 2023-03-30 VITALS — BP 130/82 | HR 75 | Temp 97.2°F | Ht 64.0 in | Wt 225.8 lb

## 2023-03-30 DIAGNOSIS — C250 Malignant neoplasm of head of pancreas: Secondary | ICD-10-CM | POA: Insufficient documentation

## 2023-03-30 DIAGNOSIS — C259 Malignant neoplasm of pancreas, unspecified: Secondary | ICD-10-CM

## 2023-03-30 DIAGNOSIS — Z7984 Long term (current) use of oral hypoglycemic drugs: Secondary | ICD-10-CM | POA: Diagnosis not present

## 2023-03-30 DIAGNOSIS — Z794 Long term (current) use of insulin: Secondary | ICD-10-CM

## 2023-03-30 DIAGNOSIS — E119 Type 2 diabetes mellitus without complications: Secondary | ICD-10-CM | POA: Diagnosis not present

## 2023-03-30 LAB — MICROALBUMIN / CREATININE URINE RATIO
Creatinine,U: 616.3 mg/dL
Microalb Creat Ratio: 8.6 mg/g (ref 0.0–30.0)
Microalb, Ur: 53 mg/dL — ABNORMAL HIGH (ref 0.0–1.9)

## 2023-03-30 MED ORDER — INSULIN GLARGINE 100 UNIT/ML SOLOSTAR PEN: 20.0000 [IU] | PEN_INJECTOR | Freq: Every day | SUBCUTANEOUS | 0 refills | Status: DC

## 2023-03-30 NOTE — Progress Notes (Signed)
 Genetic screening lab order entered

## 2023-03-30 NOTE — Patient Instructions (Addendum)
 Discuss whether ok to continue GLP-1 agonist and topiramate  with Dr. Cloretta I'm ok with it.   Ok to stop rosuvastatin  since it doesn't play well with chemotherapy. Discuss about vitamin supplementation with low appetite Discuss about who will manage controlled substances, I'm happy to do complaining of-management if he is ok. Discuss experimental medications, academic trials,   pharmacy and social work referrals are now pending.   It was a pleasure seeing you today! Your health and satisfaction are our top priorities.  Bernardino Cone, MD  Your Providers PCP: Cone Bernardino MATSU, MD,  [phone]) Referring Provider: Cone Bernardino MATSU, MD,  [phone]) Care Team Provider: Alveta Aleene PARAS, MD,  [phone]) Care Team Provider: Lorence Ozell CROME, MD,  [phone]) Care Team Provider: Eda Baxter, MD,  [phone])   VISIT SUMMARY:  During today's visit, we discussed your recent diagnosis of metastatic pancreatic cancer and its spread to the liver and lungs. We reviewed your current symptoms, medications, and overall health status. We also talked about potential treatment options, pain management, and the importance of maintaining your weight during chemotherapy. Additionally, we addressed your diabetes management and goals of care, including advanced directives and end-of-life planning.  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 We discussed various treatment options, including experimental treatments and pain management strategies. You are advised to hold off on GLP-1 agonists (Ozempic /Mounjaro ) until consulting with your oncologist, Dr. Channing. We also talked about the importance of maintaining your weight during chemotherapy and the potential impact of vitamin supplementation. You will be referred to [facility] for financial assistance  and support with experimental treatments. Continue with your current medications, including Lexapro , albuterol  inhaler, nasal antihistamine, vitamin D , Synthroid , montelukast , Zofran , and oxycodone  (dissolving type). Rosuvastatin  has been stopped due to potential interactions with chemotherapy.  -DIABETES MELLITUS TYPE 2: This is a condition where your body has difficulty regulating blood sugar levels. Your diabetes is well-controlled with Lantus  and Glucophage . We will reapply the Dexcom CGM after consulting with Dr. Channing. Continue taking Glucophage  2000 mg and we will refill your insulin  (Lantus ).  -GOALS OF CARE: We discussed your preferences for advanced directives and end-of-life care, focusing on quality of life over aggressive treatments. You will discuss these preferences further with Dr. Channing.  INSTRUCTIONS:  Please follow up with Dr. Channing tomorrow to discuss experimental treatments, pain management, and dietary concerns. Schedule a follow-up appointment with your primary care physician as needed.  NEXT STEPS: [x]  Early Intervention: Schedule sooner appointment, call our on-call services, or go to emergency room if there is any significant Increase in pain or discomfort New or worsening symptoms Sudden or severe changes in your health [x]  Flexible Follow-Up: We recommend a Return if symptoms worsen or fail to improve. for optimal routine care. This allows for progress monitoring and treatment adjustments. [x]  Preventive Care: Schedule your annual preventive care visit! It's typically covered by insurance and helps identify potential health issues early. [x]  Lab & X-ray Appointments: Incomplete tests scheduled today, or call to schedule. X-rays: [facility] (M-F, 8:30am-noon or 1pm-5pm). [x]  Medical Information Release: Sign a release form at front desk to obtain relevant medical information we don't have.  MAKING THE MOST OF OUR FOCUSED 20 MINUTE APPOINTMENTS: [x]    Clearly state your top concerns at the beginning of the visit to focus our discussion [x]   If you anticipate you will need more time, please inform the front desk during scheduling - we can book multiple appointments in the same week. [x]   If you have transportation problems- use our convenient video appointments or ask about transportation support. [x]   We can get down to business faster if you use MyChart to update information before the visit and submit non-urgent questions before your visit. Thank you for taking the time to provide details through MyChart.  Let our nurse know and she can import this information into your encounter documents.  Arrival and Wait Times: [x]   Arriving on time ensures that everyone receives prompt attention. [x]   Early morning (8a) and afternoon (1p) appointments tend to have shortest wait times. [x]   Unfortunately, we cannot delay appointments for late arrivals or hold slots during phone calls.  Getting Answers and Following Up [x]   Simple Questions & Concerns: For quick questions or basic follow-up after your visit, reach us  at [phone] or MyChart messaging. [x]   Complex Concerns: If your concern is more complex, scheduling an appointment might be best. Discuss this with the staff to find the most suitable option. [x]   Lab & Imaging Results: We'll contact you directly if results are abnormal or you don't use MyChart. Most normal results will be on MyChart within 2-3 business days, with a review message from Dr. Jesus. Haven't heard back in 2 weeks? Need results sooner? Contact us  at [phone]. [x]   Referrals: Our referral coordinator will manage specialist referrals. The specialist's office should contact you within 2 weeks to schedule an appointment. Call us  if you haven't heard from them after 2 weeks.  Staying Connected [x]   MyChart: Activate your MyChart for the fastest way to access results and message us . See the last page of this paperwork for  instructions on how to activate.  Bring to Your Next Appointment [x]   Medications: Please bring all your medication bottles to your next appointment to ensure we have an accurate record of your prescriptions. [x]   Health Diaries: If you're monitoring any health conditions at home, keeping a diary of your readings can be very helpful for discussions at your next appointment.  Billing [x]   X-ray & Lab Orders: These are billed by separate companies. Contact the invoicing company directly for questions or concerns. [x]   Visit Charges: Discuss any billing inquiries with our administrative services team.  Your Satisfaction Matters [x]   Share Your Experience: We strive for your satisfaction! If you have any complaints, or preferably compliments, please let Dr. Jesus know directly or contact our Practice Administrators, Manuelita Rubin or [employer], by asking at the front desk.   Reviewing Your Records [x]   Review this early draft of your clinical encounter notes below and the final encounter summary tomorrow on MyChart after its been completed.  All orders placed so far are visible here: Pancreatic adenocarcinoma (HCC) -     AMB Referral [facility]  New onset type 2 diabetes mellitus (HCC) -     Microalbumin / creatinine urine ratio -     AMB Referral [facility] Care [facility] -     Insulin  Glargine; Inject 20 Units into the skin daily.  Dispense: 30 mL; Refill: 0 -     Microalbumin / creatinine urine ratio

## 2023-03-30 NOTE — Assessment & Plan Note (Signed)
 Pancreatic Cancer with Metastasis to Liver and Lung Confirmed diagnosis with a poor prognosis. We discussed experimental treatments, quality of life, and pain management options, including narcotics, anxiety medications, cannabis, and ketamine, while currently managing pain with extra strength Tylenol . Advised to hold off on GLP-1 agonists (Ozempic /Mounjaro ) until consultation with oncologist Dr. Channing and discussed the potential impact of vitamin supplementation and the importance of maintaining weight during chemotherapy. We will refer them to [facility] for financial assistance and support with experimental treatments, consult Dr. Channing about GLP-1 agonists, vitamin supplementation, and pain management, hold off on Mounjaro , stop rosuvastatin , and continue Lexapro , albuterol  inhaler, nasal antihistamine, vitamin D , Synthroid , montelukast , Zofran , oxycodone  (dissolving type), and refill insulin  (Lantus ).

## 2023-03-30 NOTE — Progress Notes (Signed)
 Complex Care Management Note  Care Guide Note 03/30/2023 Name: [name] MRN: [id number] DOB: 1971-03-09  Debbie [name] is a 53 y.o. year old female who sees Jesus Bernardino MATSU, MD for primary care. I reached out to [name] by phone today to offer complex care management services.  Ms. Borel was given information about Complex Care Management services today including:   The Complex Care Management services include support from the care team which includes your Nurse Coordinator, Clinical Social Worker, or Pharmacist.  The Complex Care Management team is here to help remove barriers to the health concerns and goals most important to you. Complex Care Management services are voluntary, and the patient may decline or stop services at any time by request to their care team member.   Complex Care Management Consent Status: Patient agreed to services and verbal consent obtained.   Follow up plan:  Telephone appointment with complex care management team member scheduled for:  04/04/2023  Encounter Outcome:  Patient Scheduled  Thedford Franks, [facility] Care Coordination Care Guide Direct Dial: [phone]

## 2023-03-30 NOTE — Progress Notes (Signed)
 ==============================  [facility]: [id number]   -- Medical Office Visit --  Patient: Debbie [name]      Age: 53 y.o.       Sex:  female  Date:   03/30/2023 Today's Healthcare Provider: Bernardino KANDICE Cone, MD  ==============================   CHIEF COMPLAINT: 2 week follow-up, Diabetes, and Pancreatic Cancer   SUBJECTIVE: 53 y.o. female who has AKI (acute kidney injury) (HCC); Hypothyroidism; Hyperlipidemia; Seasonal allergies; Morbid obesity (HCC); Essential hypertension; History of endometrial ablation; Upper abdominal pain; Itching of ear; Other chronic sinusitis; Vertigo; Tympanosclerosis; Weakness on right side of face; Seasonal depression (HCC); Facial palsy; Diplopia; Blurry vision; Type 2 diabetes mellitus (HCC); Asthma, chronic; PCOS (polycystic ovarian syndrome); Acute pancreatitis; Pancreatic mass; Gastritis and gastroduodenitis; and Mass of pancreas on their problem list.  History of Present Illness The patient, with a recent diagnosis of metastatic pancreatic cancer, presents for a follow-up consultation. The cancer has reportedly spread to the liver and lungs, as confirmed by a recent pathology report.  The patient reports minimal pain, managed effectively with Tylenol , and has experienced only one episode of discomfort in the past three days. They also report a significant loss of appetite, consuming only small amounts of food at a time. Despite this, the patient denies any nausea.  The patient has a history of diabetes, which was well-controlled with a combination of Glucophage  and Mounjaro , achieving a blood glucose level of 95. The patient also reports a weight loss of 10 pounds during a recent hospital stay.  The patient is currently on several medications, including albuterol  inhaler as needed, nasal antihistamine, vitamin D , Lexapro  for mood, and topiramate  for weight loss. The patient has stopped taking rosuvastatin  due to  potential interactions with chemotherapy.  The patient expresses a sense of peace regarding their diagnosis and is prepared to fight the disease with the support of their medical team. They have been referred to a financial assistance team to help manage the costs of treatment.  Note that patient  has a past medical history of Acute renal failure (ARF) (HCC) (10/01/2015), Allergy, Depression, DKA, type 2 (HCC) (03/01/2023), Elevated cholesterol, History of endometrial ablation (05/20/2021), Hyperlipidemia, Hypertension, Hypertriglyceridemia (02/08/2022), Hypothyroidism, Insomnia (11/08/2021), Morbid obesity (HCC) (02/08/2019), New onset type 2 diabetes mellitus (HCC) (02/23/2023), Seasonal allergies, Sleep apnea, Thyroid disease, and Tympanosclerosis (07/11/2022).  Problem list overviews that were updated at today's visit:No problems updated.  Med reconciliation: Medications - Tylenol  extra strength - Albuterol  inhaler - Nasal antihistamine - Vitamin D  - Nexium - Lexapro  20 mg - Insulin  - Glucophage  2000 mg - Synthroid  75 mcg - Montelukast  10 mg - Zofran  - Oxycodone  - Rosuvastatin  Current Outpatient Medications on File Prior to Visit  Medication Sig   albuterol  (VENTOLIN  HFA) 108 (90 Base) MCG/ACT inhaler 1 PUFF AS NEEDED EVERY 4 HOURS FOR COUGH/WHEEZE INHALATION 90 DAYS   Albuterol -Budesonide (AIRSUPRA ) 90-80 MCG/ACT AERO Inhale 2 Inhalations into the lungs every 4 (four) hours as needed. Replaces albuterol , rescue inhaler   Ascorbic Acid (VITAMIN C) 1000 MG tablet Take 1,000 mg by mouth daily.   Azelastine -Fluticasone  137-50 MCG/ACT SUSP PLACE 1 SPRAY INTO THE NOSE EVERY 12 (TWELVE) HOURS.   Blood Glucose Monitoring Suppl DEVI 1 each by Does not apply route in the morning, at noon, and at bedtime. May substitute to any manufacturer covered by patient's insurance.   budesonide-formoterol (SYMBICORT) 80-4.5 MCG/ACT inhaler SMARTSIG:By Mouth   Cholecalciferol (D3-1000 PO) Take 1,000  Units by mouth daily.  Continuous Glucose Sensor (DEXCOM G7 SENSOR) MISC 1 Act by Does not apply route daily.   Cyanocobalamin (VITAMIN B12 PO) Take by mouth daily.   escitalopram  (LEXAPRO ) 20 MG tablet TAKE 1 TABLET BY MOUTH EVERY DAY   Glucose Blood (BLOOD GLUCOSE TEST STRIPS) STRP 1 each by In Vitro route in the morning, at noon, and at bedtime. May substitute to any manufacturer covered by patient's insurance.   ibuprofen (ADVIL) 600 MG tablet Take 600 mg by mouth every 8 (eight) hours as needed for cramping, moderate pain, mild pain, headache or fever.   Insulin  Pen Needle 32G X 4 MM MISC 1 each 4 (four) times daily.   Lancet Device MISC 1 each by Does not apply route in the morning, at noon, and at bedtime. May substitute to any manufacturer covered by patient's insurance.   Lancets Misc. MISC 1 each by Does not apply route in the morning, at noon, and at bedtime. May substitute to any manufacturer covered by patient's insurance.   levocetirizine (XYZAL ) 5 MG tablet Take 1 tablet (5 mg total) by mouth every evening.   levothyroxine  (SYNTHROID ) 75 MCG tablet Take 1 tablet (75 mcg total) by mouth daily.   losartan -hydrochlorothiazide  (HYZAAR) 50-12.5 MG tablet TAKE 1 TABLET BY MOUTH DAILY. OK TO START WITH JUST HALF TABLET DAILY FOR WEEK 1 AND LEAVE IT THERE IF GOAL OF 140/90 REACHED. (Patient taking differently: Take 1 tablet by mouth daily.)   metFORMIN  (GLUCOPHAGE ) 500 MG tablet Take 2 tablets (1,000 mg total) by mouth 2 (two) times daily with a meal. Replaces 500 mg twice daily dosing for better sugar control   montelukast  (SINGULAIR ) 10 MG tablet Take 1 tablet by mouth every evening.   ondansetron  (ZOFRAN ) 4 MG tablet Take 1 tablet (4 mg total) by mouth every 6 (six) hours as needed for nausea.   oxyCODONE  (ROXICODONE ) 5 MG immediate release tablet Take 1 tablet (5 mg total) by mouth every 4 (four) hours as needed for up to 5 days for severe pain (pain score 7-10) or moderate pain (pain  score 4-6).   pantoprazole  (PROTONIX ) 40 MG tablet Take 1 tablet (40 mg total) by mouth 2 (two) times daily.   rosuvastatin  (CRESTOR ) 20 MG tablet TAKE 1 TABLET BY MOUTH EVERY DAY   tirzepatide  (MOUNJARO ) 5 MG/0.5ML Pen Inject 5 mg into the skin once a week. Replaces Wegovy .  Move straight to this from Wegovy  0.5 mg   topiramate  (TOPAMAX ) 25 MG tablet TAKE 1 TABLET (25 MG TOTAL) BY MOUTH 2 (TWO) TIMES DAILY. TAKE JUST ONE TABLET DAILY FOR THE FIRST WEEK.   Zinc 50 MG TABS Take by mouth daily.   No current facility-administered medications on file prior to visit.   Medications Discontinued During This Encounter  Medication Reason   Saline (SIMPLY SALINE) 0.9 % AERS    insulin  glargine (LANTUS ) 100 UNIT/ML Solostar Pen Reorder      Objective   Physical Exam     03/30/2023    8:17 AM 03/28/2023    7:49 AM 03/28/2023    5:00 AM  Vitals with BMI  Height 5' 4    Weight 225 lbs 13 oz  223 lbs 9 oz  BMI 38.74  38.35  Systolic 130 149   Diastolic 82 90   Pulse 75 78    Wt Readings from Last 10 Encounters:  03/30/23 225 lb 12.8 oz (102.4 kg)  03/28/23 223 lb 8.7 oz (101.4 kg)  03/16/23 233 lb (105.7 kg)  03/01/23  235 lb (106.6 kg)  02/23/23 241 lb 6.4 oz (109.5 kg)  01/16/23 251 lb 6.4 oz (114 kg)  08/22/22 246 lb (111.6 kg)  07/27/22 241 lb 4.8 oz (109.5 kg)  07/11/22 240 lb (108.9 kg)  02/08/22 239 lb 6.4 oz (108.6 kg)   Vital signs reviewed.  Nursing notes reviewed. Weight trend reviewed. Abnormalities and Problem-Specific physical exam findings:  at peace with new diagnosis.  General Appearance:  No acute distress appreciable.   Well-groomed, healthy-appearing female.  Well proportioned with no abnormal fat distribution.  Good muscle tone. Pulmonary:  Normal work of breathing at rest, no respiratory distress apparent. SpO2: 96 %  Musculoskeletal: All extremities are intact.  Neurological:  Awake, alert, oriented, and engaged.  No obvious focal neurological deficits or cognitive  impairments.  Sensorium seems unclouded.   Speech is clear and coherent with logical content. Psychiatric:  Appropriate mood, pleasant and cooperative demeanor, thoughtful and engaged during the exam    No results found for any visits on 03/30/23. Admission on 03/24/2023, Discharged on 03/28/2023  Component Date Value   Sodium 03/24/2023 136    Potassium 03/24/2023 3.3 (L)    Chloride 03/24/2023 101    CO2 03/24/2023 19 (L)    Glucose, Bld 03/24/2023 207 (H)    BUN 03/24/2023 9    Creatinine, Ser 03/24/2023 0.72    Calcium  03/24/2023 10.1    GFR, Estimated 03/24/2023 >60    Anion gap 03/24/2023 16 (H)    WBC 03/24/2023 9.4    RBC 03/24/2023 4.36    Hemoglobin 03/24/2023 13.9    HCT 03/24/2023 39.7    MCV 03/24/2023 91.1    MCH 03/24/2023 31.9    MCHC 03/24/2023 35.0    RDW 03/24/2023 13.2    Platelets 03/24/2023 229    nRBC 03/24/2023 0.0    Troponin I (High Sensiti* 03/24/2023 8    Troponin I (High Sensiti* 03/24/2023 6    Glucose-Capillary 03/24/2023 152 (H)    Total Protein 03/24/2023 7.5    Albumin 03/24/2023 4.2    AST 03/24/2023 34    ALT 03/24/2023 29    Alkaline Phosphatase 03/24/2023 81    Total Bilirubin 03/24/2023 1.0    Bilirubin, Direct 03/24/2023 0.1    Indirect Bilirubin 03/24/2023 0.9    Lipase 03/24/2023 1,232 (H)    Glucose-Capillary 03/24/2023 125 (H)    Sodium 03/25/2023 139    Potassium 03/25/2023 3.2 (L)    Chloride 03/25/2023 105    CO2 03/25/2023 24    Glucose, Bld 03/25/2023 126 (H)    BUN 03/25/2023 5 (L)    Creatinine, Ser 03/25/2023 0.58    Calcium  03/25/2023 8.9    Total Protein 03/25/2023 6.7    Albumin 03/25/2023 3.8    AST 03/25/2023 24    ALT 03/25/2023 22    Alkaline Phosphatase 03/25/2023 71    Total Bilirubin 03/25/2023 1.1    GFR, Estimated 03/25/2023 >60    Anion gap 03/25/2023 10    WBC 03/25/2023 7.9    RBC 03/25/2023 4.25    Hemoglobin 03/25/2023 13.5    HCT 03/25/2023 38.3    MCV 03/25/2023 90.1    MCH 03/25/2023  31.8    MCHC 03/25/2023 35.2    RDW 03/25/2023 13.6    Platelets 03/25/2023 181    nRBC 03/25/2023 0.0    Glucose-Capillary 03/24/2023 126 (H)    Glucose-Capillary 03/25/2023 110 (H)    Glucose-Capillary 03/25/2023 130 (H)    Glucose-Capillary 03/25/2023 128 (H)  Lipase 03/25/2023 262 (H)    CA 19-9 03/25/2023 126,444 (H)    Glucose-Capillary 03/25/2023 118 (H)    Glucose-Capillary 03/25/2023 183 (H)    Sodium 03/26/2023 139    Potassium 03/26/2023 3.1 (L)    Chloride 03/26/2023 106    CO2 03/26/2023 22    Glucose, Bld 03/26/2023 136 (H)    BUN 03/26/2023 <5 (L)    Creatinine, Ser 03/26/2023 0.59    Calcium  03/26/2023 8.6 (L)    GFR, Estimated 03/26/2023 >60    Anion gap 03/26/2023 11    Glucose-Capillary 03/25/2023 92    Glucose-Capillary 03/25/2023 92    Glucose-Capillary 03/26/2023 96    Glucose-Capillary 03/26/2023 131 (H)    CA 19-9 03/26/2023 121,720 (H)    Glucose-Capillary 03/26/2023 100 (H)    Glucose-Capillary 03/26/2023 108 (H)    Sodium 03/27/2023 139    Potassium 03/27/2023 3.6    Chloride 03/27/2023 106    CO2 03/27/2023 20 (L)    Glucose, Bld 03/27/2023 119 (H)    BUN 03/27/2023 <5 (L)    Creatinine, Ser 03/27/2023 0.53    Calcium  03/27/2023 8.7 (L)    Total Protein 03/27/2023 6.7    Albumin 03/27/2023 3.3 (L)    AST 03/27/2023 21    ALT 03/27/2023 17    Alkaline Phosphatase 03/27/2023 81    Total Bilirubin 03/27/2023 1.2    GFR, Estimated 03/27/2023 >60    Anion gap 03/27/2023 13    WBC 03/27/2023 5.5    RBC 03/27/2023 3.86 (L)    Hemoglobin 03/27/2023 12.2    HCT 03/27/2023 35.2 (L)    MCV 03/27/2023 91.2    MCH 03/27/2023 31.6    MCHC 03/27/2023 34.7    RDW 03/27/2023 13.2    Platelets 03/27/2023 181    nRBC 03/27/2023 0.0    Neutrophils Relative % 03/27/2023 56    Neutro Abs 03/27/2023 3.0    Lymphocytes Relative 03/27/2023 30    Lymphs Abs 03/27/2023 1.6    Monocytes Relative 03/27/2023 8    Monocytes Absolute 03/27/2023 0.5     Eosinophils Relative 03/27/2023 6    Eosinophils Absolute 03/27/2023 0.3    Basophils Relative 03/27/2023 0    Basophils Absolute 03/27/2023 0.0    Immature Granulocytes 03/27/2023 0    Abs Immature Granulocytes 03/27/2023 0.01    Magnesium  03/27/2023 1.8    Phosphorus 03/27/2023 1.9 (L)    Lipase 03/27/2023 55 (H)    Glucose-Capillary 03/26/2023 114 (H)    Glucose-Capillary 03/27/2023 97    Glucose-Capillary 03/27/2023 124 (H)    Glucose-Capillary 03/27/2023 110 (H)    SURGICAL PATHOLOGY 03/27/2023                     Value:SURGICAL PATHOLOGY CASE: [id number] PATIENT: Sonyia Verrill Surgical Pathology Report     Clinical History: pancreatic tail mass, R/O H. Pylori (cm)     FINAL MICROSCOPIC DIAGNOSIS:  A. STOMACH, BIOPSY: -  Antral and oxyntic mucosa with no significant pathology. -  No Helicobacter pylori organisms identified on HE stained slide.    GROSS DESCRIPTION:  A. Received in formalin labeled with the patients name and Gastric bx is a 1.3 x 1.3 x 0.2 cm aggregate of tan soft tissue fragments, submitted in toto in a single cassette.  (LEF 03/27/2023)  Final Diagnosis performed by Mark LeGolvan DO.   Electronically signed 03/28/2023 Technical component performed at [employer]. [facility], [address] [place], [place] [address].  Professional component  performed at [facility], [address], [place], [place] [address].  Immunohistochemistry Technical component (if applicable) was performed at [facility]. [address], [place], [place] [address].   IMMUNOHISTOCHEMISTRY DISCLAIMER (if applicable): Some of these immunohistochemical stains may have been developed and the performance characteristics determine by [facility]. Some may not have been cleared or approved by the U.S. Food and Drug Administration. The FDA has determined that such clearance or  approval is not necessary. This test is used for clinical purposes. It should not be regarded as investigational or for research. This laboratory is certified under the Clinical Laboratory Improvement Amendments of 1988 (CLIA-88) as qualified to perform high complexity clinical laboratory testing.  The controls stained appropriately.   IHC stains are performed on formalin fixed, paraffin embedded tissue using a 3,3diaminobenzidine (DAB) chromogen and Leica Bond Autostainer System. The staining intensity of the nucleus is score manually and is reported as the percentage of tumor cell nuclei demonstrating speci                         fic nuclear staining. The specimens are fixed in 10% Neutral Formalin for at least 6 hours and up to 72hrs. These tests are validated on decalcified tissue. Results should be interpreted with caution given the possibility of false negative results on decalcified specimens. Antibody Clones are as follows ER-clone 41F, PR-clone 16, Ki67- clone MM1. Some of these immunohistochemical stains may have been developed and the performance characteristics determined by [facility].    CYTOLOGY - NON GYN 03/27/2023                     Value:CYTOLOGY - NON PAP CASE: [id number] PATIENT: Kairy Brodbeck Non-Gynecological Cytology Report     Clinical History: Pancreatic tail mass    FINAL MICROSCOPIC DIAGNOSIS:  A. PANCREAS, TAIL, FINE NEEDLE ASPIRATION: - Adenocarcinoma  Note: Dr. Rebbecca is peer-reviewed the case and agrees with the interpretation.  There is sufficient material in the cellblock for further testing as clinically indicated.  Dr. Wilhelmenia was informed of the diagnosis on 03/28/2023.  SPECIMEN ADEQUACY; Satisfactory for Evaluation    IMMEDIATE EVALUATION: HIGHLY SUSPICIOUS FOR ADENOCARCINOMA (HL / 03/27/23)   GROSS: Received is/are: (A) (1) 2 Slides (1 Quick Stain).  (2) 2 Slides (1 Quick Stain).  (3) 2 Slides (1 Quick Stain).   Also received 15ccs of pale pink, cloudy saline solution from needle rinses. (SA:MW:mw) Smears: (A) 6 Concentration Method (Thin Prep): (A) 1 Cell Block: (A) 1 Conventional Additional Studies: N/A    Final Diagnosis performed by Mark LeGolvan DO.                            Electronically signed 03/29/2023 Technical component performed at [employer]. [facility], [address] [place], [place] [address].  Professional component performed at [facility], [address] [place], [place] [address].  Immunohistochemistry Technical component (if applicable) was performed at [facility]. [address], [place], [place] [address].   IMMUNOHISTOCHEMISTRY DISCLAIMER (if applicable): Some of these immunohistochemical stains may have been developed and the performance characteristics determine by [facility]. Some may not have been cleared or approved by the U.S. Food  and Drug Administration. The FDA has determined that such clearance or approval is not necessary. This test is used for clinical purposes. It should not be regarded as investigational or for research. This laboratory is certified under the Clinical Laboratory Improvement Amendments of 1988 (CLIA-88) as qualified to per                         form high complexity clinical laboratory testing.  The controls stained appropriately.   IHC stains are performed on formalin fixed, paraffin embedded tissue using a 3,3diaminobenzidine (DAB) chromogen and Leica Bond Autostainer System. The staining intensity of the nucleus is score manually and is reported as the percentage of tumor cell nuclei demonstrating specific nuclear staining. The specimens are fixed in 10% Neutral Formalin for at least 6 hours and up to 72hrs. These tests are validated on decalcified tissue. Results should be interpreted with caution given the possibility of false negative results on decalcified  specimens. Antibody Clones are as follows ER-clone 65F, PR-clone 16, Ki67- clone MM1. Some of these immunohistochemical stains may have been developed and the performance characteristics determined by [facility].    Glucose-Capillary 03/27/2023 91    Glucose-Capillary 03/27/2023 100 (H)    Glucose-Capillary 03/28/2023 101 (H)    Glucose-Capillary 03/28/2023 112 (H)    Glucose-Capillary 03/28/2023 100 (H)   Admission on 03/01/2023, Discharged on 03/02/2023  Component Date Value   Glucose-Capillary 03/01/2023 531 (HH)    Sodium 03/01/2023 133 (L)    Potassium 03/01/2023 4.5    Chloride 03/01/2023 94 (L)    CO2 03/01/2023 12 (L)    Glucose, Bld 03/01/2023 516 (HH)    BUN 03/01/2023 27 (H)    Creatinine, Ser 03/01/2023 1.51 (H)    Calcium  03/01/2023 10.1    GFR, Estimated 03/01/2023 41 (L)    Anion gap 03/01/2023 27 (H)    Sodium 03/01/2023 135    Potassium 03/01/2023 3.8    Chloride 03/01/2023 106    CO2 03/01/2023 17 (L)    Glucose, Bld 03/01/2023 246 (H)    BUN 03/01/2023 18    Creatinine, Ser 03/01/2023 0.90    Calcium  03/01/2023 8.9    GFR, Estimated 03/01/2023 >60    Anion gap 03/01/2023 12    Osmolality 03/01/2023 333 (HH)    WBC 03/01/2023 13.2 (H)    RBC 03/01/2023 5.09    Hemoglobin 03/01/2023 16.1 (H)    HCT 03/01/2023 47.9 (H)    MCV 03/01/2023 94.1    MCH 03/01/2023 31.6    MCHC 03/01/2023 33.6    RDW 03/01/2023 13.7    Platelets 03/01/2023 339    nRBC 03/01/2023 0.0    Neutrophils Relative % 03/01/2023 64    Neutro Abs 03/01/2023 8.6 (H)    Lymphocytes Relative 03/01/2023 26    Lymphs Abs 03/01/2023 3.5    Monocytes Relative 03/01/2023 7    Monocytes Absolute 03/01/2023 1.0    Eosinophils Relative 03/01/2023 1    Eosinophils Absolute 03/01/2023 0.1    Basophils Relative 03/01/2023 1    Basophils Absolute 03/01/2023 0.1    Immature Granulocytes 03/01/2023 1    Abs Immature Granulocytes 03/01/2023 0.06    Color, Urine 03/01/2023 YELLOW     APPearance 03/01/2023 HAZY (A)    Specific Gravity, Urine 03/01/2023 1.023    pH 03/01/2023 5.0    Glucose, UA 03/01/2023 >=500 (A)    Hgb urine dipstick 03/01/2023 NEGATIVE    Bilirubin Urine 03/01/2023 NEGATIVE  Ketones, ur 03/01/2023 80 (A)    Protein, ur 03/01/2023 30 (A)    Nitrite 03/01/2023 NEGATIVE    Leukocytes,Ua 03/01/2023 TRACE (A)    RBC / HPF 03/01/2023 0-5    WBC, UA 03/01/2023 6-10    Bacteria, UA 03/01/2023 RARE (A)    Squamous Epithelial / HPF 03/01/2023 6-10    Mucus 03/01/2023 PRESENT    Budding Yeast 03/01/2023 PRESENT    Hyaline Casts, UA 03/01/2023 PRESENT    Sodium 03/01/2023 132 (L)    Potassium 03/01/2023 4.5    Chloride 03/01/2023 100    BUN 03/01/2023 29 (H)    Creatinine, Ser 03/01/2023 0.90    Glucose, Bld 03/01/2023 522 (HH)    Calcium , Ion 03/01/2023 1.22    TCO2 03/01/2023 14 (L)    Hemoglobin 03/01/2023 16.7 (H)    HCT 03/01/2023 49.0 (H)    Comment 03/01/2023 NOTIFIED PHYSICIAN    Preg, Serum 03/01/2023 NEGATIVE    Glucose-Capillary 03/01/2023 433 (H)    HIV Screen 4th Generatio* 03/01/2023 Non Reactive    Glucose-Capillary 03/01/2023 347 (H)    Glucose-Capillary 03/01/2023 278 (H)    Glucose-Capillary 03/01/2023 226 (H)    Beta-Hydroxybutyric Acid 03/01/2023 0.85 (H)    WBC 03/02/2023 6.8    RBC 03/02/2023 3.95    Hemoglobin 03/02/2023 12.4    HCT 03/02/2023 35.5 (L)    MCV 03/02/2023 89.9    MCH 03/02/2023 31.4    MCHC 03/02/2023 34.9    RDW 03/02/2023 13.4    Platelets 03/02/2023 186    nRBC 03/02/2023 0.0    Glucose-Capillary 03/01/2023 250 (H)    TSH 03/01/2023 0.238 (L)    Sodium 03/01/2023 134 (L)    Potassium 03/01/2023 3.1 (L)    Chloride 03/01/2023 103    CO2 03/01/2023 22    Glucose, Bld 03/01/2023 225 (H)    BUN 03/01/2023 11    Creatinine, Ser 03/01/2023 0.77    Calcium  03/01/2023 9.0    GFR, Estimated 03/01/2023 >60    Anion gap 03/01/2023 9    Glucose-Capillary 03/01/2023 246 (H)    Glucose-Capillary  03/01/2023 205 (H)    Glucose-Capillary 03/01/2023 243 (H)    Glucose-Capillary 03/01/2023 206 (H)    Glucose-Capillary 03/02/2023 187 (H)    Glucose-Capillary 03/02/2023 154 (H)    Glucose-Capillary 03/02/2023 195 (H)    Glucose-Capillary 03/02/2023 175 (H)    Glucose-Capillary 03/02/2023 147 (H)    Glucose-Capillary 03/02/2023 146 (H)    Glucose-Capillary 03/02/2023 235 (H)   Office Visit on 02/23/2023  Component Date Value   Hemoglobin A1C 02/23/2023 11.1 (A)   Office Visit on 08/22/2022  Component Date Value   Sodium 08/22/2022 141    Potassium 08/22/2022 3.9    Chloride 08/22/2022 105    CO2 08/22/2022 23    Glucose, Bld 08/22/2022 105 (H)    BUN 08/22/2022 13    Creatinine, Ser 08/22/2022 0.64    Total Bilirubin 08/22/2022 0.4    Alkaline Phosphatase 08/22/2022 54    AST 08/22/2022 29    ALT 08/22/2022 27    Total Protein 08/22/2022 7.2    Albumin 08/22/2022 4.4    GFR 08/22/2022 101.95    Calcium  08/22/2022 9.6    TSH 08/22/2022 1.35    Hgb A1c MFr Bld 08/22/2022 6.2    WBC 08/22/2022 7.5    RBC 08/22/2022 4.20    Hemoglobin 08/22/2022 13.3    HCT 08/22/2022 38.6    MCV 08/22/2022 91.9    MCHC 08/22/2022  34.5    RDW 08/22/2022 13.6    Platelets 08/22/2022 271.0    Neutrophils Relative % 08/22/2022 54.4    Lymphocytes Relative 08/22/2022 35.4    Monocytes Relative 08/22/2022 7.0    Eosinophils Relative 08/22/2022 2.5    Basophils Relative 08/22/2022 0.7    Neutro Abs 08/22/2022 4.1    Lymphs Abs 08/22/2022 2.7    Monocytes Absolute 08/22/2022 0.5    Eosinophils Absolute 08/22/2022 0.2    Basophils Absolute 08/22/2022 0.1    VITD 08/22/2022 49.43    Cholesterol 08/22/2022 174    Triglycerides 08/22/2022 218.0 (H)    HDL 08/22/2022 45.50    VLDL 08/22/2022 43.6 (H)    Total CHOL/HDL Ratio 08/22/2022 4    NonHDL 08/22/2022 128.28    Direct LDL 08/22/2022 100.0   No image results found. CT CHEST WO CONTRAST Result Date: 03/27/2023 CLINICAL DATA:  Occult  malignancy EXAM: CT CHEST WITHOUT CONTRAST TECHNIQUE: Multidetector CT imaging of the chest was performed following the standard protocol without IV contrast. RADIATION DOSE REDUCTION: This exam was performed according to the departmental dose-optimization program which includes automated exposure control, adjustment of the mA and/or kV according to patient size and/or use of iterative reconstruction technique. COMPARISON:  CT chest 06/17/2020. CT abdomen and pelvis 03/24/2023. FINDINGS: Cardiovascular: No significant vascular findings. Normal heart size. No pericardial effusion. Mediastinum/Nodes: There is a right thyroid nodule measuring 12 mm. There are no enlarged mediastinal or hilar lymph nodes. Esophagus is within limits. There are 2 cystic areas in the right cardiophrenic angle which appear unchanged from 2022 favored as pericardial cyst. The largest measures up to 3 cm. Lungs/Pleura: New spiculated nodular density in the superior segment of the right lower lobe measures 8 x 4 mm on image 3/62. The lungs are otherwise clear. Upper Abdomen: Hepatic and pancreatic lesions are again seen and appear unchanged from CT abdomen and pelvis 03/23/1998. Musculoskeletal: No focal osseous lesion. Degenerative changes affect the spine. IMPRESSION: 1. New spiculated nodular density in the superior segment of the right lower lobe measuring 8 x 4 mm. Findings are worrisome for malignancy. 2. Stable cystic areas in the right cardiophrenic angle favored as pericardial cysts. 3. Stable hepatic and pancreatic lesions. 4. Incidental right thyroid nodule measuring 1.2 cm. No follow-up imaging is recommended. Reference: J Am Coll Radiol. 2015 Feb;12(2): 143-50 Electronically Signed   By: Greig Pique M.D.   On: 03/27/2023 23:37   CT ABDOMEN PELVIS W CONTRAST Result Date: 03/24/2023 CLINICAL DATA:  Abdominal pain, acute, nonlocalized elevated lipase. * Tracking Code: BO * EXAM: CT ABDOMEN AND PELVIS WITH CONTRAST TECHNIQUE:  Multidetector CT imaging of the abdomen and pelvis was performed using the standard protocol following bolus administration of intravenous contrast. RADIATION DOSE REDUCTION: This exam was performed according to the departmental dose-optimization program which includes automated exposure control, adjustment of the mA and/or kV according to patient size and/or use of iterative reconstruction technique. CONTRAST:  75mL OMNIPAQUE  IOHEXOL  350 MG/ML SOLN COMPARISON:  CT scan abdomen and pelvis from 06/11/2021. FINDINGS: Lower chest: There are patchy atelectatic changes in the visualized lung bases. No overt consolidation. No pleural effusion. The heart is normal in size. No pericardial effusion. 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 Hepatobiliary: The liver is normal in size. Non-cirrhotic configuration. There are at least 10-12, ill-defined, hypoattenuating target like liver lesions with largest in the right hepatic lobe, segment 7 measuring  up to 3.3 x 4.9 cm, compatible with metastases. No intrahepatic or extrahepatic bile duct dilation. No calcified gallstones. Normal gallbladder wall thickness. No pericholecystic inflammatory changes. Pancreas: There is heterogeneous, hypoattenuating mass centered in the pancreatic tail measuring up to 3.0 x 4.6 cm, highly concerning for pancreatic adenocarcinoma. 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 There is also fat stranding surrounding the pancreatic head/uncinate process and duodenum. The epicenter of the fat stranding appears to be centered more around duodenum and pancreas may be secondarily involved. Correlate clinically. Otherwise unremarkable pancreas. Main pancreatic duct is not dilated.  Spleen: Within normal limits. No focal lesion. Adrenals/Urinary Tract: Adrenal glands are unremarkable. No suspicious renal mass. No hydronephrosis. No renal or ureteric calculi. Unremarkable urinary bladder. Stomach/Bowel: No disproportionate dilation of the small or large bowel loops. The appendix is unremarkable. There is mild circumferential and irregular thickening of the wall of second and third part of duodenum with surrounding fat stranding, concerning for duodenitis. No discrete duodenal ulcer seen. No pneumoperitoneum. There are multiple diverticula mainly in the left hemi colon, without imaging signs of diverticulitis. Vascular/Lymphatic: No ascites or pneumoperitoneum. No abdominal or pelvic lymphadenopathy, by size criteria. No aneurysmal dilation of the major abdominal arteries. There are mild peripheral atherosclerotic vascular calcifications of the aorta and its major branches. Reproductive: The uterus is unremarkable. No large adnexal mass. Other: There is a tiny fat containing umbilical hernia. The soft tissues and abdominal wall are otherwise unremarkable. Musculoskeletal: No suspicious osseous lesions. There are mild multilevel degenerative changes in the visualized spine. IMPRESSION: 1. 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 2. There is fat stranding predominantly surrounding the second and third parts of duodenum and also extending around the pancreatic head/uncinate process. Findings favor duodenitis with secondary involvement of pancreatic head/uncinate process. Correlate clinically. 3. Multiple other nonacute observations, as described above. Electronically Signed   By: Ree Molt M.D.   On: 03/24/2023 16:01   DG Chest 2 View Result Date: 03/24/2023 CLINICAL DATA:  Chest pain EXAM: CHEST - 2 VIEW COMPARISON:  03/01/2023 FINDINGS: The heart size and mediastinal contours are within  normal limits. Both lungs are clear. The visualized skeletal structures are unremarkable. IMPRESSION: No active cardiopulmonary disease. Electronically Signed   By: Oneil Devonshire M.D.   On: 03/24/2023 00:56   DG Chest 1 View Result Date: 03/01/2023 CLINICAL DATA:  Weakness. EXAM: CHEST  1 VIEW COMPARISON:  October 25, 2012. FINDINGS: The heart size and mediastinal contours are within normal limits. Both lungs are clear. The visualized skeletal structures are unremarkable. IMPRESSION: No active disease. Electronically Signed   By: Lynwood Landy Raddle M.D.   On: 03/01/2023 10:10  CT ABDOMEN PELVIS W CONTRAST Result Date: 03/24/2023 CLINICAL DATA:  Abdominal pain, acute, nonlocalized elevated lipase. * Tracking Code: BO * EXAM: CT ABDOMEN AND PELVIS WITH CONTRAST TECHNIQUE: Multidetector CT imaging of the abdomen and pelvis was performed using the standard protocol following bolus administration of intravenous contrast. RADIATION DOSE REDUCTION: This exam was performed according to the departmental dose-optimization program which includes automated exposure control, adjustment of the mA and/or kV according to patient size and/or use of iterative reconstruction technique. CONTRAST:  75mL OMNIPAQUE  IOHEXOL  350 MG/ML SOLN COMPARISON:  CT scan abdomen and  pelvis from 06/11/2021. FINDINGS: Lower chest: There are patchy atelectatic changes in the visualized lung bases. No overt consolidation. No pleural effusion. The heart is normal in size. No pericardial effusion. 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 Hepatobiliary: The liver is normal in size. Non-cirrhotic configuration. There are at least 10-12, ill-defined, hypoattenuating target like liver lesions with largest in the right hepatic lobe, segment 7 measuring up to 3.3 x 4.9 cm, compatible with metastases. No intrahepatic or extrahepatic bile duct dilation. No  calcified gallstones. Normal gallbladder wall thickness. No pericholecystic inflammatory changes. Pancreas: There is heterogeneous, hypoattenuating mass centered in the pancreatic tail measuring up to 3.0 x 4.6 cm, highly concerning for pancreatic adenocarcinoma. 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 There is also fat stranding surrounding the pancreatic head/uncinate process and duodenum. The epicenter of the fat stranding appears to be centered more around duodenum and pancreas may be secondarily involved. Correlate clinically. Otherwise unremarkable pancreas. Main pancreatic duct is not dilated. Spleen: Within normal limits. No focal lesion. Adrenals/Urinary Tract: Adrenal glands are unremarkable. No suspicious renal mass. No hydronephrosis. No renal or ureteric calculi. Unremarkable urinary bladder. Stomach/Bowel: No disproportionate dilation of the small or large bowel loops. The appendix is unremarkable. There is mild circumferential and irregular thickening of the wall of second and third part of duodenum with surrounding fat stranding, concerning for duodenitis. No discrete duodenal ulcer seen. No pneumoperitoneum. There are multiple diverticula mainly in the left hemi colon, without imaging signs of diverticulitis. Vascular/Lymphatic: No ascites or pneumoperitoneum. No abdominal or pelvic lymphadenopathy, by size criteria. No aneurysmal dilation of the major abdominal arteries. There are mild peripheral atherosclerotic vascular calcifications of the aorta and its major branches. Reproductive: The uterus is unremarkable. No large adnexal mass. Other: There is a tiny fat containing umbilical hernia. The soft tissues and abdominal wall are otherwise unremarkable. Musculoskeletal: No suspicious osseous lesions. There are mild multilevel  degenerative changes in the visualized spine. IMPRESSION: 1. 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 2. There is fat stranding predominantly surrounding the second and third parts of duodenum and also extending around the pancreatic head/uncinate process. Findings favor duodenitis with secondary involvement of pancreatic head/uncinate process. Correlate clinically. 3. Multiple other nonacute observations, as described above. Electronically Signed   By: Ree Molt M.D.   On: 03/24/2023 16:01   DG Chest 2 View Result Date: 03/24/2023 CLINICAL DATA:  Chest pain EXAM: CHEST - 2 VIEW COMPARISON:  03/01/2023 FINDINGS: The heart size and mediastinal contours are within normal limits. Both lungs are clear. The visualized skeletal structures are unremarkable. IMPRESSION: No active cardiopulmonary disease. Electronically Signed   By: Oneil Devonshire M.D.   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 We discussed experimental treatments, quality of life, and pain management options, including narcotics, anxiety medications, cannabis, and ketamine, while currently managing pain with extra strength Tylenol . Advised to hold off on GLP-1 agonists (Ozempic /Mounjaro ) until consultation with oncologist Dr. Channing and discussed the potential impact of vitamin supplementation and the importance of maintaining weight  during chemotherapy. We will refer them to [facility] for financial assistance and support with experimental treatments, consult Dr. Channing about GLP-1 agonists, vitamin supplementation, and pain management, hold off on Mounjaro , stop rosuvastatin , and continue Lexapro , albuterol  inhaler, nasal antihistamine, vitamin D , Synthroid , montelukast , Zofran , oxycodone  (dissolving type), and refill  insulin  (Lantus ). New onset type 2 diabetes mellitus (HCC) Diabetes Mellitus Type 2 They have good control with Lantus  and Glucophage . The Dexcom CGM will be re-applied after consultation with Dr. Channing. We discussed the potential impact of GLP-1 agonists on pancreatic health and cancer progression. We will refill insulin  (Lantus ), reapply Dexcom CGM after consultation with Dr. Channing, and continue Glucophage  2000 mg.     Orders Placed During this Encounter:   Orders Placed This Encounter  Procedures   Microalbumin / creatinine urine ratio   Microalbumin / creatinine urine ratio    [place]   AMB Referral [facility] Care Management    Referral Priority:   Routine    Referral Type:   Consultation    Referral Reason:   Care Coordination    Number of Visits Requested:   1   Meds ordered this encounter  Medications   insulin  glargine (LANTUS ) 100 UNIT/ML Solostar Pen    Sig: Inject 20 Units into the skin daily.    Dispense:  30 mL    Refill:  0    Goals of Care We discussed advanced directives and end-of-life care planning, with a priority on quality of life over aggressive treatments. They will discuss advanced directives and end-of-life care preferences with Dr. Channing.  Follow-up They will follow up with Dr. Channing tomorrow to discuss experimental treatments, pain management, and dietary concerns and schedule a follow-up appointment with the primary care physician as needed.    This document was synthesized by artificial intelligence (Abridge) using HIPAA-compliant recording of the clinical interaction;   We discussed the use of AI scribe software for clinical note transcription with the patient, who gave verbal consent to proceed.    Additional Info: This encounter employed state-of-the-art, real-time, collaborative documentation. The patient actively reviewed and assisted in updating their electronic medical record on a shared screen, ensuring transparency and facilitating joint  problem-solving for the problem list, overview, and plan. This approach promotes accurate, informed care. The treatment plan was discussed and reviewed in detail, including medication safety, potential side effects, and all patient questions. We confirmed understanding and comfort with the plan. Follow-up instructions were established, including contacting the office for any concerns, returning if symptoms worsen, persist, or new symptoms develop, and precautions for potential emergency department visits.

## 2023-03-30 NOTE — Progress Notes (Signed)
 Complex Care Management Note Care Guide Note  03/30/2023 Name: Debbie [name] MRN: [id number] DOB: [birth year]   Complex Care Management Outreach Attempts: An unsuccessful telephone outreach was attempted today to offer the patient information about available complex care management services.  Follow Up Plan:  Additional outreach attempts will be made to offer the patient complex care management information and services.   Encounter Outcome:  No Answer  Thedford Franks, CCMA Care Coordination Care Guide Direct Dial: [phone]

## 2023-03-31 ENCOUNTER — Other Ambulatory Visit: Payer: Self-pay | Admitting: *Deleted

## 2023-03-31 ENCOUNTER — Inpatient Hospital Stay: Payer: 59 | Attending: Nurse Practitioner | Admitting: Nurse Practitioner

## 2023-03-31 ENCOUNTER — Encounter: Payer: Self-pay | Admitting: Nurse Practitioner

## 2023-03-31 ENCOUNTER — Encounter: Payer: Self-pay | Admitting: *Deleted

## 2023-03-31 VITALS — BP 139/85 | HR 89 | Temp 98.2°F | Resp 18 | Ht 64.0 in | Wt 226.0 lb

## 2023-03-31 DIAGNOSIS — I10 Essential (primary) hypertension: Secondary | ICD-10-CM | POA: Insufficient documentation

## 2023-03-31 DIAGNOSIS — J45909 Unspecified asthma, uncomplicated: Secondary | ICD-10-CM | POA: Insufficient documentation

## 2023-03-31 DIAGNOSIS — C252 Malignant neoplasm of tail of pancreas: Secondary | ICD-10-CM | POA: Insufficient documentation

## 2023-03-31 DIAGNOSIS — E039 Hypothyroidism, unspecified: Secondary | ICD-10-CM | POA: Diagnosis not present

## 2023-03-31 DIAGNOSIS — C787 Secondary malignant neoplasm of liver and intrahepatic bile duct: Secondary | ICD-10-CM | POA: Diagnosis not present

## 2023-03-31 DIAGNOSIS — C259 Malignant neoplasm of pancreas, unspecified: Secondary | ICD-10-CM

## 2023-03-31 DIAGNOSIS — E785 Hyperlipidemia, unspecified: Secondary | ICD-10-CM | POA: Insufficient documentation

## 2023-03-31 DIAGNOSIS — E119 Type 2 diabetes mellitus without complications: Secondary | ICD-10-CM | POA: Diagnosis not present

## 2023-03-31 DIAGNOSIS — Z5111 Encounter for antineoplastic chemotherapy: Secondary | ICD-10-CM | POA: Diagnosis present

## 2023-03-31 NOTE — Progress Notes (Signed)
  [facility] OFFICE PROGRESS NOTE   Diagnosis: Pancreas cancer  INTERVAL HISTORY:   Debbie [name] returns for her first outpatient visit since hospital discharge.  She is feeling better.  She has mild abdominal discomfort mainly at nighttime.  Tylenol  has been effective.  She had mild nausea this morning.  Appetite is poor.  She is not hungry.  No diarrhea.  No fever.  Overall good control of blood sugars.  Objective:  Vital signs in last 24 hours:  Blood pressure 139/85, pulse 89, temperature 98.2 F (36.8 C), temperature source Temporal, resp. rate 18, height 5' 4 (1.626 m), weight 226 lb (102.5 kg), SpO2 98%.   Resp: Lungs clear bilaterally. Cardio: Regular rate and rhythm. GI: Abdomen soft and nontender.  No hepatosplenomegaly. Vascular: No leg edema.   Lab Results:  Lab Results  Component Value Date   WBC 5.5 03/27/2023   HGB 12.2 03/27/2023   HCT 35.2 (L) 03/27/2023   MCV 91.2 03/27/2023   PLT 181 03/27/2023   NEUTROABS 3.0 03/27/2023    Imaging:  No results found.  Medications: I have reviewed the patient's current medications.  Assessment/Plan: 1.  Pancreas mass, liver lesions CT abdomen/pelvis 03/24/2023: 3 x 4.6 cm pancreas tail mass, multiple liver lesions consistent with metastases, fat stranding surrounding the second and third ports of the duodenum and pancreas head/uncinate  CT chest 03/27/2023: New spiculated nodular density in the right lower lobe, 8 x 4 mm EUS 03/27/2023: Pancreas tail mass, liver lesions, T2 N0 M1, needle biopsy of pancreas mass, adenocarcinoma Elevated CA 19-9 (121,720 on 03/26/2023)   2.  Diabetes 3.  Acute abdominal pain and nausea/vomiting 03/23/2023 secondary to pancreatitis.  Improved 03/31/2023 4.  Hypertension. 5.  Asthma 6.  Hypothyroidism 7.  Hyperlipidemia 8.  Acute pancreatitis 03/24/2023  Disposition: Debbie [name] appears to have metastatic pancreas cancer involving the liver.  We reviewed the diagnosis, prognosis  and treatment options.  She understands no therapy will be curative.  We discussed the recommendation for systemic therapy with FOLFIRINOX.  We reviewed potential side effects associated with chemotherapy including bone marrow toxicity, nausea, hair loss, allergic reaction.  We discussed some of the potential toxicities associated with 5-fluorouracil  including mouth sores, diarrhea, skin rash, skin hyperpigmentation, increased sensitivity to sun, hand-foot syndrome, cardiotoxicity;  irinotecan  including bone marrow toxicity, hair loss, diarrhea (both early and late phase);  various forms of neuropathy associated with oxaliplatin .  She was provided with printed information on the above as well.  She understands a Port-A-Cath is required for this regimen.  She agrees to proceed as above.  She will attend a chemotherapy education class.  Referral placed to the genetics counselor.  She will return for a follow-up appointment and cycle 1 FOLFIRINOX on 04/10/2023.  We are available to see her sooner if needed.  Patient seen with Dr. Cloretta.  Olam Ned ANP/GNP-BC   03/31/2023  12:01 PM  This was a shared visit with Olam Ned. The biopsy from the pancreas mass confirmed adenocarcinoma.  Debbie [name] has been diagnosed with metastatic pancreas cancer.  We discussed treatment options.  She understands the goals of treatment are to palliate symptoms and extend survival.  No therapy will be curative.  I recommend FOLFIRINOX.  We reviewed potential toxicities associated with the FOLFIRINOX regimen.  She will attend a chemotherapy teaching class.  She agrees to proceed.  She will undergo Port-A-Cath placement with the plan to begin FOLFIRINOX on 04/10/2023.  Ms. Hobdy will be referred to  the genetics counselor.  We requested NGS testing on the pancreas biopsy.  We will refer her for biopsy of the liver lesion if the pancreas biopsy tissue is inadequate for molecular testing.  A treatment plan was entered  today.  I was present for greater than 50% of today's visit.  I performed medical decision making.  Arvella Hof, MD

## 2023-03-31 NOTE — Progress Notes (Signed)
 PATIENT NAVIGATOR PROGRESS NOTE  Name: Debbie [name] Date: 03/31/2023 MRN: [id number]  DOB: 04-28-1970   Reason for visit:  New patient appt  Comments:  Met with Mr and Mrs Holdman and her brother during visit with Dr Cloretta and Olam Ned NP Referral to [employer] lease kit orders entered) Referral to Social Work Referral to [employer] to be placed on 04/03/23 at [facility], arrival time of 0800, written instructions given and reviewed Journeys notebook given with disease specific information as well as information on FOLFIRINOX Pt scheduled for patient education session on 1/15 after visit with Resa Prost Given contact number to call with any questions    Time spent counseling/coordinating care: > 60 minutes

## 2023-03-31 NOTE — Progress Notes (Signed)
START ON PATHWAY REGIMEN - Pancreatic Adenocarcinoma     A cycle is every 14 days:     Irinotecan      Oxaliplatin      Leucovorin      Fluorouracil   **Always confirm dose/schedule in your pharmacy ordering system**  Patient Characteristics: Metastatic Disease, First Line, PS = 0,1, BRCA1/2 and PALB2  Mutation Absent/Unknown Therapeutic Status: Metastatic Disease Line of Therapy: First Line ECOG Performance Status: 1 BRCA1/2 Mutation Status: Awaiting Test Results PALB2 Mutation Status: Awaiting Test Results Intent of Therapy: Non-Curative / Palliative Intent, Discussed with Patient 

## 2023-04-01 ENCOUNTER — Other Ambulatory Visit: Payer: Self-pay

## 2023-04-01 ENCOUNTER — Other Ambulatory Visit (HOSPITAL_COMMUNITY): Payer: Self-pay | Admitting: Student

## 2023-04-02 ENCOUNTER — Encounter: Payer: Self-pay | Admitting: Internal Medicine

## 2023-04-02 DIAGNOSIS — E1121 Type 2 diabetes mellitus with diabetic nephropathy: Secondary | ICD-10-CM | POA: Insufficient documentation

## 2023-04-02 NOTE — Progress Notes (Signed)
 Small amounts microalbumin in urine. On losartan.  Will discuss at next visit but its not a significant concern now that sugar controlled

## 2023-04-03 ENCOUNTER — Other Ambulatory Visit: Payer: Self-pay

## 2023-04-03 ENCOUNTER — Encounter (HOSPITAL_COMMUNITY): Payer: Self-pay

## 2023-04-03 ENCOUNTER — Ambulatory Visit (HOSPITAL_COMMUNITY)
Admission: RE | Admit: 2023-04-03 | Discharge: 2023-04-03 | Disposition: A | Discharge status: Home / Self Care | Payer: 59 | Source: Ambulatory Visit | Attending: Nurse Practitioner | Admitting: Nurse Practitioner

## 2023-04-03 DIAGNOSIS — Z803 Family history of malignant neoplasm of breast: Secondary | ICD-10-CM | POA: Diagnosis not present

## 2023-04-03 DIAGNOSIS — C259 Malignant neoplasm of pancreas, unspecified: Secondary | ICD-10-CM | POA: Diagnosis present

## 2023-04-03 DIAGNOSIS — Z8042 Family history of malignant neoplasm of prostate: Secondary | ICD-10-CM | POA: Diagnosis not present

## 2023-04-03 HISTORY — PX: IR IMAGING GUIDED PORT INSERTION: IMG5740

## 2023-04-03 LAB — GLUCOSE, CAPILLARY
Glucose-Capillary: 100 mg/dL — ABNORMAL HIGH (ref 70–99)
Glucose-Capillary: 116 mg/dL — ABNORMAL HIGH (ref 70–99)

## 2023-04-03 MED ORDER — LIDOCAINE-EPINEPHRINE 1 %-1:100000 IJ SOLN
INTRAMUSCULAR | Status: AC
  Filled 2023-04-03: qty 1

## 2023-04-03 MED ORDER — FENTANYL CITRATE (PF) 100 MCG/2ML IJ SOLN
INTRAMUSCULAR | Status: AC | PRN
Start: 2023-04-03 — End: 2023-04-03
  Administered 2023-04-03 (×2): 50 ug via INTRAVENOUS

## 2023-04-03 MED ORDER — LIDOCAINE-EPINEPHRINE 2 %-1:100000 IJ SOLN: 20.0000 mL | Freq: Once | INTRAMUSCULAR | Status: DC

## 2023-04-03 MED ORDER — HEPARIN SOD (PORK) LOCK FLUSH 100 UNIT/ML IV SOLN
INTRAVENOUS | Status: AC
  Filled 2023-04-03: qty 5

## 2023-04-03 MED ORDER — MIDAZOLAM HCL 2 MG/2ML IJ SOLN
INTRAMUSCULAR | Status: AC | PRN
  Administered 2023-04-03 (×4): 1 mg via INTRAVENOUS

## 2023-04-03 MED ORDER — FENTANYL CITRATE (PF) 100 MCG/2ML IJ SOLN
INTRAMUSCULAR | Status: AC
  Filled 2023-04-03: qty 2

## 2023-04-03 MED ORDER — HEPARIN SOD (PORK) LOCK FLUSH 100 UNIT/ML IV SOLN
500.0000 [IU] | Freq: Once | INTRAVENOUS | Status: AC
  Administered 2023-04-03: 500 [IU] via INTRAVENOUS

## 2023-04-03 MED ORDER — MIDAZOLAM HCL 2 MG/2ML IJ SOLN
INTRAMUSCULAR | Status: AC
  Filled 2023-04-03: qty 4

## 2023-04-03 NOTE — H&P (Signed)
 Chief Complaint: Patient was seen in consultation today for pancreatic CA, in need of PAC at the request of Debby Planas K  Referring Physician(s): Debby Planas POUR  Supervising Physician: Hughes Simmonds  Patient Status: [facility] - Out-pt  History of Present Illness: Debbie [name] is a 53 y.o. female with PMHs of HTN, HLD, DM, and pancreatic CA who presents for [facility] placement.   She was recently diagnosed with metastatic pancreatic CA, she is in need of PAC for long term CVC for her chemotherapy. IR will proceed with PAC placement.   Patient laying in bed, not in acute distress.  Denise headache, fever, chills, shortness of breath, cough, chest pain, abdominal pain, nausea ,vomiting, and bleeding.   Past Medical History:  Diagnosis Date   Acute renal failure (ARF) (HCC) 10/01/2015   Allergy    Depression    DKA, type 2 (HCC) 03/01/2023   Elevated cholesterol    History of endometrial ablation 05/20/2021   2021   Hyperlipidemia    Hypertension    Hypertriglyceridemia 02/08/2022   No components found for: TG Lab Results Component Value Date/Time  TRIG 125.0 08/18/2021 08:53 AM  TRIG 163.0 (H) 12/29/2020 09:20 AM  TRIG 148.0 08/04/2020 08:24 AM  TRIG 177.0 (H) 04/03/2019 07:30 AM     Hypothyroidism    Insomnia 11/08/2021   Morbid obesity (HCC) 02/08/2019   New onset type 2 diabetes mellitus (HCC) 02/23/2023   Type 2 Diabetes Mellitus (E11.9) Onset: 02/23/2023 Key Events:  Initial A1c: 11.1% Presenting with polydipsia Current Treatment: Initiating Metformin  and Mounjaro  Monitoring: Blood glucose monitoring TID Follow-up in 2 weeks Quality Metrics: Eye exam needed Diabetes education pending Tags: #NewProblem #RequiresFollowUp #PatientEducation     Seasonal allergies    Sleep apnea    wears cpap    Thyroid disease    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 Fleeta Smock     Past Surgical History:  Procedure  Laterality Date   ABLATION  10/2019   ADENOIDECTOMY     BIOPSY  03/27/2023   Procedure: BIOPSY;  Surgeon: Wilhelmenia Aloha Raddle., MD;  Location: [facility] ENDOSCOPY;  Service: Gastroenterology;;   COLONOSCOPY     DENTAL SURGERY     graft of mouth    ESOPHAGOGASTRODUODENOSCOPY (EGD) WITH PROPOFOL  N/A 03/27/2023   Procedure: ESOPHAGOGASTRODUODENOSCOPY (EGD) WITH PROPOFOL ;  Surgeon: Wilhelmenia Aloha Raddle., MD;  Location: [facility] ENDOSCOPY;  Service: Gastroenterology;  Laterality: N/A;   EUS  03/27/2023   Procedure: UPPER ENDOSCOPIC ULTRASOUND (EUS) LINEAR;  Surgeon: Wilhelmenia Aloha Raddle., MD;  Location: [facility] ENDOSCOPY;  Service: Gastroenterology;;   FINE NEEDLE ASPIRATION  03/27/2023   Procedure: FINE NEEDLE ASPIRATION (FNA) LINEAR;  Surgeon: Wilhelmenia Aloha Raddle., MD;  Location: [facility] ENDOSCOPY;  Service: Gastroenterology;;    Allergies: Atorvastatin  Medications: Prior to Admission medications   Medication Sig Start Date End Date Taking? Authorizing Provider  albuterol  (VENTOLIN  HFA) 108 (90 Base) MCG/ACT inhaler 1 PUFF AS NEEDED EVERY 4 HOURS FOR COUGH/WHEEZE INHALATION 90 DAYS   Yes [provider]  Albuterol -Budesonide (AIRSUPRA ) 90-80 MCG/ACT AERO Inhale 2 Inhalations into the lungs every 4 (four) hours as needed. Replaces albuterol , rescue inhaler 02/23/23  Yes Jesus Bernardino MATSU, MD  Ascorbic Acid (VITAMIN C) 1000 MG tablet Take 1,000 mg by mouth daily.   Yes [provider]  Azelastine -Fluticasone  137-50 MCG/ACT SUSP PLACE 1 SPRAY INTO THE NOSE EVERY 12 (TWELVE) HOURS. 07/21/21  Yes Kip Ade, NP  budesonide-formoterol (SYMBICORT) 80-4.5 MCG/ACT inhaler SMARTSIG:By Mouth  03/24/23  Yes [provider]  Cholecalciferol (D3-1000 PO) Take 1,000 Units by mouth daily.   Yes [provider]  Cyanocobalamin (VITAMIN B12 PO) Take by mouth daily.   Yes [provider]  escitalopram  (LEXAPRO ) 20 MG tablet TAKE 1 TABLET BY MOUTH EVERY DAY 12/21/22  Yes Jesus Bernardino MATSU,  MD  insulin  glargine (LANTUS ) 100 UNIT/ML Solostar Pen Inject 20 Units into the skin daily. 03/30/23  Yes Jesus Bernardino MATSU, MD  levocetirizine (XYZAL ) 5 MG tablet Take 1 tablet (5 mg total) by mouth every evening. 01/16/23  Yes Jesus Bernardino MATSU, MD  levothyroxine  (SYNTHROID ) 75 MCG tablet Take 1 tablet (75 mcg total) by mouth daily. 03/16/23  Yes Jesus Bernardino MATSU, MD  losartan -hydrochlorothiazide  (HYZAAR) 50-12.5 MG tablet TAKE 1 TABLET BY MOUTH DAILY. OK TO START WITH JUST HALF TABLET DAILY FOR WEEK 1 AND LEAVE IT THERE IF GOAL OF 140/90 REACHED. Patient taking differently: Take 1 tablet by mouth daily. 10/12/22  Yes Jesus Bernardino MATSU, MD  metFORMIN  (GLUCOPHAGE ) 500 MG tablet Take 2 tablets (1,000 mg total) by mouth 2 (two) times daily with a meal. Replaces 500 mg twice daily dosing for better sugar control 03/16/23  Yes Jesus Bernardino MATSU, MD  montelukast  (SINGULAIR ) 10 MG tablet Take 1 tablet by mouth every evening. 07/13/15  Yes [provider]  ondansetron  (ZOFRAN ) 4 MG tablet Take 1 tablet (4 mg total) by mouth every 6 (six) hours as needed for nausea. 03/28/23  Yes Raenelle Coria, MD  pantoprazole  (PROTONIX ) 40 MG tablet Take 1 tablet (40 mg total) by mouth 2 (two) times daily. 03/28/23  Yes Raenelle Coria, MD  rosuvastatin  (CRESTOR ) 20 MG tablet TAKE 1 TABLET BY MOUTH EVERY DAY 12/21/22  Yes Jesus Bernardino MATSU, MD  topiramate  (TOPAMAX ) 25 MG tablet TAKE 1 TABLET (25 MG TOTAL) BY MOUTH 2 (TWO) TIMES DAILY. TAKE JUST ONE TABLET DAILY FOR THE FIRST WEEK. 10/12/22  Yes Jesus Bernardino MATSU, MD  Zinc 50 MG TABS Take by mouth daily.   Yes [provider]  Blood Glucose Monitoring Suppl DEVI 1 each by Does not apply route in the morning, at noon, and at bedtime. May substitute to any manufacturer covered by patient's insurance. 03/02/23   Ghimire, Donalda HERO, MD  Continuous Glucose Sensor (DEXCOM G7 SENSOR) MISC 1 Act by Does not apply route daily. 02/23/23   Jesus Bernardino MATSU, MD  Glucose Blood (BLOOD  GLUCOSE TEST STRIPS) STRP 1 each by In Vitro route in the morning, at noon, and at bedtime. May substitute to any manufacturer covered by patient's insurance. 03/02/23 04/01/23  Ghimire, Donalda HERO, MD  ibuprofen (ADVIL) 600 MG tablet Take 600 mg by mouth every 8 (eight) hours as needed for cramping, moderate pain, mild pain, headache or fever. 10/15/21   [provider]  Insulin  Pen Needle 32G X 4 MM MISC 1 each 4 (four) times daily. 03/02/23   Ghimire, Donalda HERO, MD  tirzepatide  (MOUNJARO ) 5 MG/0.5ML Pen Inject 5 mg into the skin once a week. Replaces Wegovy .  Move straight to this from Wegovy  0.5 mg 03/16/23   Jesus Bernardino MATSU, MD     Family History  Problem Relation Age of Onset   Heart disease Father    Hypertension Mother    Hyperlipidemia Mother    Breast cancer Maternal Grandmother    Prostate cancer Maternal Grandfather    Diabetes Maternal Grandfather    CAD Maternal Grandfather    Colon cancer Neg Hx  Colon polyps Neg Hx    Esophageal cancer Neg Hx    Stomach cancer Neg Hx    Rectal cancer Neg Hx     Social History   Socioeconomic History   Marital status: Married    Spouse name: Not on file   Number of children: 0   Years of education: Not on file   Highest education level: Some college, no degree  Occupational History   Not on file  Tobacco Use   Smoking status: Never   Smokeless tobacco: Never  Vaping Use   Vaping status: Never Used  Substance and Sexual Activity   Alcohol use: No   Drug use: No   Sexual activity: Not on file  Other Topics Concern   Not on file  Social History Narrative   Not on file   Social Drivers of Health   Financial Resource Strain: Low Risk  (03/15/2023)   Overall Financial Resource Strain (CARDIA)    Difficulty of Paying Living Expenses: Not hard at all  Food Insecurity: No Food Insecurity (03/24/2023)   Hunger Vital Sign    Worried About Running Out of Food in the Last Year: Never true    Ran Out of Food in the Last  Year: Never true  Transportation Needs: No Transportation Needs (03/24/2023)   PRAPARE - Administrator, Civil Service (Medical): No    Lack of Transportation (Non-Medical): No  Physical Activity: Insufficiently Active (03/15/2023)   Exercise Vital Sign    Days of Exercise per Week: 5 days    Minutes of Exercise per Session: 10 min  Stress: No Stress Concern Present (03/15/2023)   [employer] of Occupational Health - Occupational Stress Questionnaire    Feeling of Stress : Not at all  Social Connections: Socially Integrated (03/24/2023)   Social Connection and Isolation Panel [NHANES]    Frequency of Communication with Friends and Family: More than three times a week    Frequency of Social Gatherings with Friends and Family: Once a week    Attends Religious Services: More than 4 times per year    Active Member of [employer] or Organizations: Yes    Attends Engineer, Structural: More than 4 times per year    Marital Status: Married     Review of Systems: A 12 point ROS discussed and pertinent positives are indicated in the HPI above.  All other systems are negative.  Vital Signs: BP (!) 139/91   Pulse 72   Temp 98.7 F (37.1 C)   Resp 17   Ht 5' 3 (1.6 m)   Wt 218 lb (98.9 kg)   LMP  (LMP Unknown)   SpO2 96%   BMI 38.62 kg/m    Physical Exam Vitals and nursing note reviewed.  Constitutional:      General: Patient is not in acute distress.    Appearance: Normal appearance. Patient is not ill-appearing.  HENT:     Head: Normocephalic and atraumatic.     Mouth/Throat:     Mouth: Mucous membranes are moist.     Pharynx: Oropharynx is clear.  Cardiovascular:     Rate and Rhythm: Normal rate and regular rhythm.     Pulses: Normal pulses.     Heart sounds: Normal heart sounds.  Pulmonary:     Effort: Pulmonary effort is normal.     Breath sounds: Normal breath sounds.  Abdominal:     General: Abdomen is flat. Bowel sounds are normal.  Palpations:  Abdomen is soft.  Musculoskeletal:     Cervical back: Neck supple.  Skin:    General: Skin is warm and dry.     Coloration: Skin is not jaundiced or pale.  Neurological:     Mental Status: Patient is alert and oriented to person, place, and time.  Psychiatric:        Mood and Affect: Mood normal.        Behavior: Behavior normal.        Judgment: Judgment normal.    MD Evaluation Airway: WNL Heart: WNL Abdomen: WNL Chest/ Lungs: WNL ASA  Classification: 3 Mallampati/Airway Score: Two  Imaging: CT CHEST WO CONTRAST Result Date: 03/27/2023 CLINICAL DATA:  Occult malignancy EXAM: CT CHEST WITHOUT CONTRAST TECHNIQUE: Multidetector CT imaging of the chest was performed following the standard protocol without IV contrast. RADIATION DOSE REDUCTION: This exam was performed according to the departmental dose-optimization program which includes automated exposure control, adjustment of the mA and/or kV according to patient size and/or use of iterative reconstruction technique. COMPARISON:  CT chest 06/17/2020. CT abdomen and pelvis 03/24/2023. FINDINGS: Cardiovascular: No significant vascular findings. Normal heart size. No pericardial effusion. Mediastinum/Nodes: There is a right thyroid nodule measuring 12 mm. There are no enlarged mediastinal or hilar lymph nodes. Esophagus is within limits. There are 2 cystic areas in the right cardiophrenic angle which appear unchanged from 2022 favored as pericardial cyst. The largest measures up to 3 cm. Lungs/Pleura: New spiculated nodular density in the superior segment of the right lower lobe measures 8 x 4 mm on image 3/62. The lungs are otherwise clear. Upper Abdomen: Hepatic and pancreatic lesions are again seen and appear unchanged from CT abdomen and pelvis 03/23/1998. Musculoskeletal: No focal osseous lesion. Degenerative changes affect the spine. IMPRESSION: 1. New spiculated nodular density in the superior segment of the right lower lobe measuring 8 x  4 mm. Findings are worrisome for malignancy. 2. Stable cystic areas in the right cardiophrenic angle favored as pericardial cysts. 3. Stable hepatic and pancreatic lesions. 4. Incidental right thyroid nodule measuring 1.2 cm. No follow-up imaging is recommended. Reference: J Am Coll Radiol. 2015 Feb;12(2): 143-50 Electronically Signed   By: Greig Pique M.D.   On: 03/27/2023 23:37   CT ABDOMEN PELVIS W CONTRAST Result Date: 03/24/2023 CLINICAL DATA:  Abdominal pain, acute, nonlocalized elevated lipase. * Tracking Code: BO * EXAM: CT ABDOMEN AND PELVIS WITH CONTRAST TECHNIQUE: Multidetector CT imaging of the abdomen and pelvis was performed using the standard protocol following bolus administration of intravenous contrast. RADIATION DOSE REDUCTION: This exam was performed according to the departmental dose-optimization program which includes automated exposure control, adjustment of the mA and/or kV according to patient size and/or use of iterative reconstruction technique. CONTRAST:  75mL OMNIPAQUE  IOHEXOL  350 MG/ML SOLN COMPARISON:  CT scan abdomen and pelvis from 06/11/2021. FINDINGS: Lower chest: There are patchy atelectatic changes in the visualized lung bases. No overt consolidation. No pleural effusion. The heart is normal in size. No pericardial effusion. 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 Hepatobiliary: The liver is normal in size. Non-cirrhotic configuration. There are at least 10-12, ill-defined, hypoattenuating target like liver lesions with largest in the right hepatic lobe, segment 7 measuring up to 3.3 x 4.9 cm, compatible with metastases. No intrahepatic or extrahepatic bile duct dilation. No calcified gallstones. Normal gallbladder wall thickness. No pericholecystic inflammatory changes. Pancreas: There is heterogeneous, hypoattenuating  mass centered in the pancreatic tail measuring  up to 3.0 x 4.6 cm, highly concerning for pancreatic adenocarcinoma. 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 There is also fat stranding surrounding the pancreatic head/uncinate process and duodenum. The epicenter of the fat stranding appears to be centered more around duodenum and pancreas may be secondarily involved. Correlate clinically. Otherwise unremarkable pancreas. Main pancreatic duct is not dilated. Spleen: Within normal limits. No focal lesion. Adrenals/Urinary Tract: Adrenal glands are unremarkable. No suspicious renal mass. No hydronephrosis. No renal or ureteric calculi. Unremarkable urinary bladder. Stomach/Bowel: No disproportionate dilation of the small or large bowel loops. The appendix is unremarkable. There is mild circumferential and irregular thickening of the wall of second and third part of duodenum with surrounding fat stranding, concerning for duodenitis. No discrete duodenal ulcer seen. No pneumoperitoneum. There are multiple diverticula mainly in the left hemi colon, without imaging signs of diverticulitis. Vascular/Lymphatic: No ascites or pneumoperitoneum. No abdominal or pelvic lymphadenopathy, by size criteria. No aneurysmal dilation of the major abdominal arteries. There are mild peripheral atherosclerotic vascular calcifications of the aorta and its major branches. Reproductive: The uterus is unremarkable. No large adnexal mass. Other: There is a tiny fat containing umbilical hernia. The soft tissues and abdominal wall are otherwise unremarkable. Musculoskeletal: No suspicious osseous lesions. There are mild multilevel degenerative changes in the visualized spine. IMPRESSION: 1. There is a 3.0 x 4.6 cm pancreatic tail mass and multiple target like liver lesions, favoring primary pancreatic malignancy with liver  metastases. 2. There is fat stranding predominantly surrounding the second and third parts of duodenum and also extending around the pancreatic head/uncinate process. Findings favor duodenitis with secondary involvement of pancreatic head/uncinate process. Correlate clinically. 3. Multiple other nonacute observations, as described above. Electronically Signed   By: Ree Molt M.D.   On: 03/24/2023 16:01   DG Chest 2 View Result Date: 03/24/2023 CLINICAL DATA:  Chest pain EXAM: CHEST - 2 VIEW COMPARISON:  03/01/2023 FINDINGS: The heart size and mediastinal contours are within normal limits. Both lungs are clear. The visualized skeletal structures are unremarkable. IMPRESSION: No active cardiopulmonary disease. Electronically Signed   By: Oneil Devonshire M.D.   On: 03/24/2023 00:56    Labs:  CBC: Recent Labs    03/02/23 0445 03/24/23 0038 03/25/23 0644 03/27/23 0546  WBC 6.8 9.4 7.9 5.5  HGB 12.4 13.9 13.5 12.2  HCT 35.5* 39.7 38.3 35.2*  PLT 186 229 181 181    COAGS: No results for input(s): INR, APTT in the last 8760 hours.  BMP: Recent Labs    03/24/23 0038 03/25/23 0644 03/26/23 0658 03/27/23 0546  NA 136 139 139 139  K 3.3* 3.2* 3.1* 3.6  CL 101 105 106 106  CO2 19* 24 22 20*  GLUCOSE 207* 126* 136* 119*  BUN 9 5* <5* <5*  CALCIUM  10.1 8.9 8.6* 8.7*  CREATININE 0.72 0.58 0.59 0.53  GFRNONAA >60 >60 >60 >60    LIVER FUNCTION TESTS: Recent Labs    08/22/22 0847 03/24/23 1127 03/25/23 0644 03/27/23 0546  BILITOT 0.4 1.0 1.1 1.2  AST 29 34 24 21  ALT 27 29 22 17   ALKPHOS 54 81 71 81  PROT 7.2 7.5 6.7 6.7  ALBUMIN 4.4 4.2 3.8 3.3*    TUMOR MARKERS: No results for input(s): AFPTM, CEA, CA199,  CHROMGRNA in the last 8760 hours.  Assessment and Plan: 53 y.o. female with pancreatic CA who is in need of PAC.   VSS Not on AC/AP Allergies reviewed  Risks and benefits of image guided port-a-catheter placement was discussed with the patient  including, but not limited to bleeding, infection, pneumothorax, or fibrin sheath development and need for additional procedures.  All of the patient's questions were answered, patient is agreeable to proceed. Consent signed and in chart.    Thank you for this interesting consult.  I greatly enjoyed meeting Raguel T Trindade and look forward to participating in their care.  A copy of this report was sent to the requesting provider on this date.  Electronically Signed: Toya VEAR Cousin, PA-C 04/03/2023, 9:24 AM   I spent a total of    30 minutes in face to face in clinical consultation, greater than 50% of which was counseling/coordinating care for [facility] placement.   This chart was dictated using voice recognition software.  Despite best efforts to proofread,  errors can occur which can change the documentation meaning.

## 2023-04-03 NOTE — Procedures (Signed)
 Vascular and Interventional Radiology Procedure Note  Patient: Debbie [name] DOB: February 01, 1971 Medical Record Number: [id number] Note Date/Time: 04/03/23 10:31 AM   Performing Physician: Thom Hall, MD Assistant(s): None  Diagnosis: Pancreatic cancer  Procedure: PORT PLACEMENT  Anesthesia: Conscious Sedation Complications: None Estimated Blood Loss: Minimal  Findings:  Successful right-sided port placement, with the tip of the catheter in the proximal right atrium.  Plan: Catheter ready for use.  See detailed procedure note with images in PACS. The patient tolerated the procedure well without incident or complication and was returned to Recovery in stable condition.    Thom Hall, MD Vascular and Interventional Radiology Specialists [facility] Radiology   Pager. [phone] Clinic. [phone]

## 2023-04-04 ENCOUNTER — Telehealth: Payer: Self-pay

## 2023-04-04 ENCOUNTER — Ambulatory Visit: Payer: Self-pay

## 2023-04-04 ENCOUNTER — Inpatient Hospital Stay: Payer: 59

## 2023-04-04 NOTE — Progress Notes (Signed)
 [facility] Clinical Social Work  Initial Assessment   Debbie [name] is a 53 y.o. year old female contacted by phone. Clinical Social Work was referred by new patient protocol for assessment of psychosocial needs.   SDOH (Social Determinants of Health) assessments performed: Yes   SDOH Screenings   Food Insecurity: No Food Insecurity (03/24/2023)  Housing: Low Risk  (03/24/2023)  Transportation Needs: No Transportation Needs (03/24/2023)  Utilities: Not At Risk (03/24/2023)  Depression (PHQ2-9): Low Risk  (03/30/2023)  Financial Resource Strain: Low Risk  (03/15/2023)  Physical Activity: Insufficiently Active (03/15/2023)  Social Connections: Socially Integrated (03/24/2023)  Stress: No Stress Concern Present (03/15/2023)  Tobacco Use: Low Risk  (04/03/2023)  Health Literacy: Low Risk  (12/03/2019)   Received from [facility]     Distress Screen completed: No     No data to display            Family/Social Information:  Housing Arrangement: patient lives with spouse  and her Goldendoodle  Family members/support persons in your life? Family, Friends, Church, and [employer] concerns: no, patient will be transportation to CC appointments by husband, her mother, or a network engineer.Transportation is not a concern at this time.  Employment: Working part time Patient is working PT as a lawyer. Patient has disclosed diagnosis to employer, and feels employer is supportive of any needs that may arise. Employment has a flexible schedule and allows for patient to sit if needed. Income source: Employment and Patient Spouse is currently employed. Financial concerns:  No immediate concerns at this time. Type of concern: None Food access concerns: no Religious or spiritual practice: Yes-Christian. Patient has a church community that is willing to help when needed. Services Currently in place:  Support System, Insurance, Transportation  Coping/ Adjustment to  diagnosis: Patient understands treatment plan and what happens next? yes Concerns about diagnosis and/or treatment: I'm not especially worried about anything Patient reported stressors: Adjusting to my illness Current coping skills/ strengths: Ability for insight , Active sense of humor , Average or above average intelligence , Capable of independent living , Communication skills , Financial means , General fund of knowledge , Motivation for treatment/growth , Religious Affiliation , and Supportive family/friends     SUMMARY: Current SDOH Barriers:  No SDOH Barriers identified at this time.   Clinical Social Work Clinical Goal(s):  No clinical social work goals at this time  Interventions: Discussed common feeling and emotions when being diagnosed with cancer, and the importance of support during treatment Informed patient of the support team roles and support services at [facility] Provided CSW contact information and encouraged patient to call with any questions or concerns   Follow Up Plan: Patient will contact CSW with any support or resource needs Patient verbalizes understanding of plan: Yes  Lizbeth Sprague, LCSW Clinical Social Worker [facility]

## 2023-04-04 NOTE — Telephone Encounter (Signed)
 Opened in error

## 2023-04-04 NOTE — Patient Outreach (Signed)
  Care Coordination   Initial Visit Note   04/04/2023 Name: Debbie [name] MRN: [id number] DOB: Dec 14, 1970  Debbie [name] is a 53 y.o. year old female who sees Jesus Bernardino MATSU, MD for primary care. I spoke with  Debbie [name] by phone today.  What matters to the patients health and wellness today?  Patient was scheduled in error. Patient has been seen today by Oncology SW [place] and all needs were addressed.  Patient is informed to contact her provider in the future should she need additional support.    Goals Addressed   None     SDOH assessments and interventions completed:  No     Care Coordination Interventions:  No, not indicated   Follow up plan: No further intervention required.   Encounter Outcome:  Patient Visit Completed

## 2023-04-05 ENCOUNTER — Telehealth: Payer: Self-pay | Admitting: Genetic Counselor

## 2023-04-05 ENCOUNTER — Inpatient Hospital Stay: Payer: 59 | Admitting: Nutrition

## 2023-04-05 ENCOUNTER — Inpatient Hospital Stay: Payer: 59

## 2023-04-05 ENCOUNTER — Encounter: Payer: Self-pay | Admitting: Genetic Counselor

## 2023-04-05 DIAGNOSIS — Z5111 Encounter for antineoplastic chemotherapy: Secondary | ICD-10-CM | POA: Diagnosis not present

## 2023-04-05 DIAGNOSIS — C259 Malignant neoplasm of pancreas, unspecified: Secondary | ICD-10-CM

## 2023-04-05 LAB — CBC WITH DIFFERENTIAL (CANCER CENTER ONLY)
Abs Immature Granulocytes: 0.02 10*3/uL (ref 0.00–0.07)
Basophils Absolute: 0 10*3/uL (ref 0.0–0.1)
Basophils Relative: 0 %
Eosinophils Absolute: 0.2 10*3/uL (ref 0.0–0.5)
Eosinophils Relative: 3 %
HCT: 36.7 % (ref 36.0–46.0)
Hemoglobin: 12.6 g/dL (ref 12.0–15.0)
Immature Granulocytes: 0 %
Lymphocytes Relative: 40 %
Lymphs Abs: 2.8 10*3/uL (ref 0.7–4.0)
MCH: 31.1 pg (ref 26.0–34.0)
MCHC: 34.3 g/dL (ref 30.0–36.0)
MCV: 90.6 fL (ref 80.0–100.0)
Monocytes Absolute: 0.5 10*3/uL (ref 0.1–1.0)
Monocytes Relative: 8 %
Neutro Abs: 3.4 10*3/uL (ref 1.7–7.7)
Neutrophils Relative %: 49 %
Platelet Count: 328 10*3/uL (ref 150–400)
RBC: 4.05 MIL/uL (ref 3.87–5.11)
RDW: 13.2 % (ref 11.5–15.5)
WBC Count: 7 10*3/uL (ref 4.0–10.5)
nRBC: 0 % (ref 0.0–0.2)

## 2023-04-05 LAB — CMP (CANCER CENTER ONLY)
ALT: 19 U/L (ref 0–44)
AST: 29 U/L (ref 15–41)
Albumin: 4.7 g/dL (ref 3.5–5.0)
Alkaline Phosphatase: 99 U/L (ref 38–126)
Anion gap: 10 (ref 5–15)
BUN: 13 mg/dL (ref 6–20)
CO2: 31 mmol/L (ref 22–32)
Calcium: 10.4 mg/dL — ABNORMAL HIGH (ref 8.9–10.3)
Chloride: 100 mmol/L (ref 98–111)
Creatinine: 0.6 mg/dL (ref 0.44–1.00)
GFR, Estimated: >60 mL/min (ref >60)
Glucose, Bld: 102 mg/dL — ABNORMAL HIGH (ref 70–99)
Potassium: 3.6 mmol/L (ref 3.5–5.1)
Sodium: 141 mmol/L (ref 135–145)
Total Bilirubin: 0.5 mg/dL (ref 0.0–1.2)
Total Protein: 7.6 g/dL (ref 6.5–8.1)

## 2023-04-05 MED ORDER — ONDANSETRON HCL 8 MG PO TABS: 8.0000 mg | ORAL_TABLET | Freq: Three times a day (TID) | ORAL | 0 refills | Status: DC | PRN

## 2023-04-05 MED ORDER — PROCHLORPERAZINE MALEATE 10 MG PO TABS: 10.0000 mg | ORAL_TABLET | Freq: Four times a day (QID) | ORAL | 0 refills | Status: DC | PRN

## 2023-04-05 MED ORDER — LIDOCAINE-PRILOCAINE 2.5-2.5 % EX CREA: 1.0000 | TOPICAL_CREAM | CUTANEOUS | 0 refills | Status: AC | PRN

## 2023-04-05 NOTE — Progress Notes (Signed)
 53 yo female diagnosed with Pancreas cancer and followed by Dr. Scherrie Curt. Plan includes FOLFIRINOX, 4 cycles, every 14 days.  PMH includes DM, HTN, Hypothyroidism, HLD.  Medications include Vit C, Vit D, Vit B12, Lantus , Synthroid , Metformin , Zofran , Zinc. (Patient has discontinued Mounjaro after 1 injection.)  Labs reviewed.  Height: 63 inches. Weight: 218 pounds Jan 13. UBW: ~250 pounds October 2024. BMI:38.62.  Patient is here with her mother for nutrition consult. She notes she was diagnosed with diabetes in December. She changed her diet and reduced carbohydrate intake. She does drink coffee or tea because it contributes to reflux. She gave up regular sodas and sweets. Reports weight loss a result of dietary changes and 1 week of hospitalization for pancreatitis. Blood sugars well controlled per patient.  Nutrition Diagnosis: Food and Nutrition Related Knowledge Deficit related to new diagnosis of Pancreas cancer and associated treatments as evidenced by no prior need for nutrition related information.  Intervention: Educated to eat small, frequent meals and snacks throughout the day. Continue healthy diet changes and strive for weight maintenance or slow weight loss of no more than 1 pound weekly. Educated on Producer, television/film/video. Educated on eating after Oxaliplatin . Nutrition Fact Sheets provided. Questions answered and contact information given.  Monitoring, Evaluation, Goals: Tolerate adequate calories and protein to minimize loss of lean body mass.  Next Visit: To be scheduled as needed.

## 2023-04-05 NOTE — Progress Notes (Signed)
 POC testing--- Fabiola Holy w/ [facility]RNA[facility]ight Panel ordered per request of Dr. Enedina Harrow team

## 2023-04-05 NOTE — Telephone Encounter (Signed)
 Made three attempts to try and schedule Genetic counseling appointments per scheduling message. Left three voicemail's for the patient.

## 2023-04-07 ENCOUNTER — Encounter: Payer: Self-pay | Admitting: *Deleted

## 2023-04-07 ENCOUNTER — Other Ambulatory Visit: Payer: Self-pay | Admitting: *Deleted

## 2023-04-07 DIAGNOSIS — C259 Malignant neoplasm of pancreas, unspecified: Secondary | ICD-10-CM

## 2023-04-07 LAB — CANCER ANTIGEN 19-9: CA 19-9: 180208 U/mL — ABNORMAL HIGH (ref 0–35)

## 2023-04-09 ENCOUNTER — Other Ambulatory Visit: Payer: Self-pay

## 2023-04-09 ENCOUNTER — Other Ambulatory Visit: Payer: Self-pay | Admitting: Oncology

## 2023-04-10 ENCOUNTER — Inpatient Hospital Stay: Payer: 59

## 2023-04-10 ENCOUNTER — Encounter (HOSPITAL_COMMUNITY): Payer: Self-pay

## 2023-04-10 ENCOUNTER — Other Ambulatory Visit: Payer: 59

## 2023-04-10 ENCOUNTER — Inpatient Hospital Stay (HOSPITAL_BASED_OUTPATIENT_CLINIC_OR_DEPARTMENT_OTHER): Payer: 59 | Admitting: Oncology

## 2023-04-10 VITALS — BP 126/87 | HR 77 | Temp 98.4°F | Resp 18 | Ht 63.0 in | Wt 227.3 lb

## 2023-04-10 VITALS — BP 144/89 | HR 86 | Temp 98.1°F | Resp 18

## 2023-04-10 DIAGNOSIS — Z5111 Encounter for antineoplastic chemotherapy: Secondary | ICD-10-CM | POA: Diagnosis not present

## 2023-04-10 DIAGNOSIS — C259 Malignant neoplasm of pancreas, unspecified: Secondary | ICD-10-CM | POA: Diagnosis not present

## 2023-04-10 LAB — CBC WITH DIFFERENTIAL (CANCER CENTER ONLY)
Abs Immature Granulocytes: 0.02 10*3/uL (ref 0.00–0.07)
Basophils Absolute: 0 10*3/uL (ref 0.0–0.1)
Basophils Relative: 1 %
Eosinophils Absolute: 0.2 10*3/uL (ref 0.0–0.5)
Eosinophils Relative: 3 %
HCT: 34.2 % — ABNORMAL LOW (ref 36.0–46.0)
Hemoglobin: 11.9 g/dL — ABNORMAL LOW (ref 12.0–15.0)
Immature Granulocytes: 0 %
Lymphocytes Relative: 27 %
Lymphs Abs: 2 10*3/uL (ref 0.7–4.0)
MCH: 31.3 pg (ref 26.0–34.0)
MCHC: 34.8 g/dL (ref 30.0–36.0)
MCV: 90 fL (ref 80.0–100.0)
Monocytes Absolute: 0.4 10*3/uL (ref 0.1–1.0)
Monocytes Relative: 6 %
Neutro Abs: 4.7 10*3/uL (ref 1.7–7.7)
Neutrophils Relative %: 63 %
Platelet Count: 254 10*3/uL (ref 150–400)
RBC: 3.8 MIL/uL — ABNORMAL LOW (ref 3.87–5.11)
RDW: 13.6 % (ref 11.5–15.5)
WBC Count: 7.4 10*3/uL (ref 4.0–10.5)
nRBC: 0 % (ref 0.0–0.2)

## 2023-04-10 LAB — CMP (CANCER CENTER ONLY)
ALT: 24 U/L (ref 0–44)
AST: 31 U/L (ref 15–41)
Albumin: 4.6 g/dL (ref 3.5–5.0)
Alkaline Phosphatase: 101 U/L (ref 38–126)
Anion gap: 11 (ref 5–15)
BUN: 13 mg/dL (ref 6–20)
CO2: 27 mmol/L (ref 22–32)
Calcium: 9.4 mg/dL (ref 8.9–10.3)
Chloride: 102 mmol/L (ref 98–111)
Creatinine: 0.53 mg/dL (ref 0.44–1.00)
GFR, Estimated: >60 mL/min (ref >60)
Glucose, Bld: 127 mg/dL — ABNORMAL HIGH (ref 70–99)
Potassium: 3.5 mmol/L (ref 3.5–5.1)
Sodium: 140 mmol/L (ref 135–145)
Total Bilirubin: 0.6 mg/dL (ref 0.0–1.2)
Total Protein: 7.2 g/dL (ref 6.5–8.1)

## 2023-04-10 LAB — PREGNANCY, URINE: Preg Test, Ur: POSITIVE — AB

## 2023-04-10 LAB — HCG, SERUM, QUALITATIVE: Preg, Serum: NEGATIVE

## 2023-04-10 MED ORDER — SODIUM CHLORIDE 0.9% FLUSH
10.0000 mL | INTRAVENOUS | Status: DC | PRN
Start: 2023-04-10 — End: 2023-04-10
  Administered 2023-04-10: 10 mL

## 2023-04-10 MED ORDER — ATROPINE SULFATE 1 MG/ML IV SOLN
0.5000 mg | Freq: Once | INTRAVENOUS | Status: AC | PRN
  Administered 2023-04-10: 0.5 mg via INTRAVENOUS
  Filled 2023-04-10: qty 1

## 2023-04-10 MED ORDER — LEUCOVORIN CALCIUM INJECTION 350 MG
400.0000 mg/m2 | Freq: Once | INTRAMUSCULAR | Status: AC
  Administered 2023-04-10: 860 mg via INTRAVENOUS
  Filled 2023-04-10: qty 43

## 2023-04-10 MED ORDER — OXALIPLATIN CHEMO INJECTION 100 MG/20ML
180.0000 mg | Freq: Once | INTRAVENOUS | Status: AC
  Administered 2023-04-10: 180 mg via INTRAVENOUS
  Filled 2023-04-10: qty 36

## 2023-04-10 MED ORDER — FLUOROURACIL CHEMO INJECTION 5 GM/100ML
2400.0000 mg/m2 | INTRAVENOUS | Status: DC
  Administered 2023-04-10: 5000 mg via INTRAVENOUS
  Filled 2023-04-10: qty 100

## 2023-04-10 MED ORDER — PALONOSETRON HCL INJECTION 0.25 MG/5ML
0.2500 mg | Freq: Once | INTRAVENOUS | Status: AC
  Administered 2023-04-10: 0.25 mg via INTRAVENOUS
  Filled 2023-04-10: qty 5

## 2023-04-10 MED ORDER — DEXTROSE 5 % IV SOLN: INTRAVENOUS | Status: DC

## 2023-04-10 MED ORDER — SODIUM CHLORIDE 0.9 % IV SOLN
150.0000 mg | Freq: Once | INTRAVENOUS | Status: AC
  Administered 2023-04-10: 150 mg via INTRAVENOUS
  Filled 2023-04-10: qty 150

## 2023-04-10 MED ORDER — SODIUM CHLORIDE 0.9 % IV SOLN
150.0000 mg/m2 | Freq: Once | INTRAVENOUS | Status: AC
  Administered 2023-04-10: 320 mg via INTRAVENOUS
  Filled 2023-04-10: qty 15

## 2023-04-10 MED ORDER — DEXAMETHASONE SODIUM PHOSPHATE 10 MG/ML IJ SOLN
10.0000 mg | Freq: Once | INTRAMUSCULAR | Status: AC
  Administered 2023-04-10: 10 mg via INTRAVENOUS
  Filled 2023-04-10: qty 1

## 2023-04-10 NOTE — Patient Instructions (Signed)
[facility]  Discharge Instructions: Thank you for choosing [facility] to provide your oncology and hematology care.   If you have a lab appointment with the Cancer Center, please go directly to the Cancer Center and check in at the registration area.   Wear comfortable clothing and clothing appropriate for easy access to any Portacath or PICC line.   We strive to give you quality time with your provider. You may need to reschedule your appointment if you arrive late (15 or more minutes).  Arriving late affects you and other patients whose appointments are after yours.  Also, if you miss three or more appointments without notifying the office, you may be dismissed from the clinic at the provider's discretion.      For prescription refill requests, have your pharmacy contact our office and allow 72 hours for refills to be completed.    Today you received the following chemotherapy and/or immunotherapy agents: oxaliplatin, irinotecan, leucovorin, fluorouracil      To help prevent nausea and vomiting after your treatment, we encourage you to take your nausea medication as directed.  BELOW ARE SYMPTOMS THAT SHOULD BE REPORTED IMMEDIATELY: *FEVER GREATER THAN 100.4 F (38 C) OR HIGHER *CHILLS OR SWEATING *NAUSEA AND VOMITING THAT IS NOT CONTROLLED WITH YOUR NAUSEA MEDICATION *UNUSUAL SHORTNESS OF BREATH *UNUSUAL BRUISING OR BLEEDING *URINARY PROBLEMS (pain or burning when urinating, or frequent urination) *BOWEL PROBLEMS (unusual diarrhea, constipation, pain near the anus) TENDERNESS IN MOUTH AND THROAT WITH OR WITHOUT PRESENCE OF ULCERS (sore throat, sores in mouth, or a toothache) UNUSUAL RASH, SWELLING OR PAIN  UNUSUAL VAGINAL DISCHARGE OR ITCHING   Items with * indicate a potential emergency and should be followed up as soon as possible or go to the Emergency Department if any problems should occur.  Please show the  CHEMOTHERAPY ALERT CARD or IMMUNOTHERAPY ALERT CARD at check-in to the Emergency Department and triage nurse.  Should you have questions after your visit or need to cancel or reschedule your appointment, please contact [facility]  Dept: [phone]  and follow the prompts.  Office hours are 8:00 a.m. to 4:30 p.m. Monday - Friday. Please note that voicemails left after 4:00 p.m. may not be returned until the following business day.  We are closed weekends and major holidays. You have access to a nurse at all times for urgent questions. Please call the main number to the clinic Dept: [phone] and follow the prompts.   For any non-urgent questions, you may also contact your provider using MyChart. We now offer e-Visits for anyone 23 and older to request care online for non-urgent symptoms. For details visit [email or web address].   Also download the MyChart app! Go to the app store, search "MyChart", open the app, select [place], and log in with your MyChart username and password.  Oxaliplatin Injection What is this medication? OXALIPLATIN (ox AL i PLA tin) treats colorectal cancer. It works by slowing down the growth of cancer cells. This medicine may be used for other purposes; ask your health care provider or pharmacist if you have questions. COMMON BRAND NAME(S): Eloxatin What should I tell my care team before I take this medication? They need to know if you have any of these conditions: Heart disease History of irregular heartbeat or rhythm Liver disease Low blood cell levels (white cells, red cells, and platelets) Lung or  breathing disease, such as asthma Take medications that treat or prevent blood clots Tingling of the fingers, toes, or other nerve disorder An unusual or allergic reaction to oxaliplatin, other medications, foods, dyes, or preservatives If you or your partner are pregnant or trying to get  pregnant Breast-feeding How should I use this medication? This medication is injected into a vein. It is given by your care team in a hospital or clinic setting. Talk to your care team about the use of this medication in children. Special care may be needed. Overdosage: If you think you have taken too much of this medicine contact a poison control center or emergency room at once. NOTE: This medicine is only for you. Do not share this medicine with others. What if I miss a dose? Keep appointments for follow-up doses. It is important not to miss a dose. Call your care team if you are unable to keep an appointment. What may interact with this medication? Do not take this medication with any of the following: Cisapride Dronedarone Pimozide Thioridazine This medication may also interact with the following: Aspirin and aspirin-like medications Certain medications that treat or prevent blood clots, such as warfarin, apixaban, dabigatran, and rivaroxaban Cisplatin Cyclosporine Diuretics Medications for infection, such as acyclovir, adefovir, amphotericin B, bacitracin, cidofovir, foscarnet, ganciclovir, gentamicin, pentamidine, vancomycin NSAIDs, medications for pain and inflammation, such as ibuprofen or naproxen Other medications that cause heart rhythm changes Pamidronate Zoledronic acid This list may not describe all possible interactions. Give your health care provider a list of all the medicines, herbs, non-prescription drugs, or dietary supplements you use. Also tell them if you smoke, drink alcohol, or use illegal drugs. Some items may interact with your medicine. What should I watch for while using this medication? Your condition will be monitored carefully while you are receiving this medication. You may need blood work while taking this medication. This medication may make you feel generally unwell. This is not uncommon as chemotherapy can affect healthy cells as well as cancer  cells. Report any side effects. Continue your course of treatment even though you feel ill unless your care team tells you to stop. This medication may increase your risk of getting an infection. Call your care team for advice if you get a fever, chills, sore throat, or other symptoms of a cold or flu. Do not treat yourself. Try to avoid being around people who are sick. Avoid taking medications that contain aspirin, acetaminophen, ibuprofen, naproxen, or ketoprofen unless instructed by your care team. These medications may hide a fever. Be careful brushing or flossing your teeth or using a toothpick because you may get an infection or bleed more easily. If you have any dental work done, tell your dentist you are receiving this medication. This medication can make you more sensitive to cold. Do not drink cold drinks or use ice. Cover exposed skin before coming in contact with cold temperatures or cold objects. When out in cold weather wear warm clothing and cover your mouth and nose to warm the air that goes into your lungs. Tell your care team if you get sensitive to the cold. Talk to your care team if you or your partner are pregnant or think either of you might be pregnant. This medication can cause serious birth defects if taken during pregnancy and for 9 months after the last dose. A negative pregnancy test is required before starting this medication. A reliable form of contraception is recommended while taking this medication and for 9  months after the last dose. Talk to your care team about effective forms of contraception. Do not father a child while taking this medication and for 6 months after the last dose. Use a condom while having sex during this time period. Do not breastfeed while taking this medication and for 3 months after the last dose. This medication may cause infertility. Talk to your care team if you are concerned about your fertility. What side effects may I notice from receiving this  medication? Side effects that you should report to your care team as soon as possible: Allergic reactions--skin rash, itching, hives, swelling of the face, lips, tongue, or throat Bleeding--bloody or black, tar-like stools, vomiting blood or brown material that looks like coffee grounds, red or dark brown urine, small red or purple spots on skin, unusual bruising or bleeding Dry cough, shortness of breath or trouble breathing Heart rhythm changes--fast or irregular heartbeat, dizziness, feeling faint or lightheaded, chest pain, trouble breathing Infection--fever, chills, cough, sore throat, wounds that don't heal, pain or trouble when passing urine, general feeling of discomfort or being unwell Liver injury--right upper belly pain, loss of appetite, nausea, light-colored stool, dark yellow or brown urine, yellowing skin or eyes, unusual weakness or fatigue Low red blood cell level--unusual weakness or fatigue, dizziness, headache, trouble breathing Muscle injury--unusual weakness or fatigue, muscle pain, dark yellow or brown urine, decrease in amount of urine Pain, tingling, or numbness in the hands or feet Sudden and severe headache, confusion, change in vision, seizures, which may be signs of posterior reversible encephalopathy syndrome (PRES) Unusual bruising or bleeding Side effects that usually do not require medical attention (report to your care team if they continue or are bothersome): Diarrhea Nausea Pain, redness, or swelling with sores inside the mouth or throat Unusual weakness or fatigue Vomiting This list may not describe all possible side effects. Call your doctor for medical advice about side effects. You may report side effects to FDA at 1-800-FDA-1088. Where should I keep my medication? This medication is given in a hospital or clinic. It will not be stored at home. NOTE: This sheet is a summary. It may not cover all possible information. If you have questions about this  medicine, talk to your doctor, pharmacist, or health care provider.  2024 Elsevier/Gold Standard (2023-02-17 00:00:00) Irinotecan Injection What is this medication? IRINOTECAN (ir in oh TEE kan) treats some types of cancer. It works by slowing down the growth of cancer cells. This medicine may be used for other purposes; ask your health care provider or pharmacist if you have questions. COMMON BRAND NAME(S): Camptosar What should I tell my care team before I take this medication? They need to know if you have any of these conditions: Dehydration Diarrhea Infection, especially a viral infection, such as chickenpox, cold sores, herpes Liver disease Low blood cell levels (white cells, red cells, and platelets) Low levels of electrolytes, such as calcium, magnesium, or potassium in your blood Recent or ongoing radiation An unusual or allergic reaction to irinotecan, other medications, foods, dyes, or preservatives If you or your partner are pregnant or trying to get pregnant Breast-feeding How should I use this medication? This medication is injected into a vein. It is given by your care team in a hospital or clinic setting. Talk to your care team about the use of this medication in children. Special care may be needed. Overdosage: If you think you have taken too much of this medicine contact a poison control center or emergency  room at once. NOTE: This medicine is only for you. Do not share this medicine with others. What if I miss a dose? Keep appointments for follow-up doses. It is important not to miss your dose. Call your care team if you are unable to keep an appointment. What may interact with this medication? Do not take this medication with any of the following: Cobicistat Itraconazole This medication may also interact with the following: Certain antibiotics, such as clarithromycin, rifampin, rifabutin Certain antivirals for HIV or AIDS Certain medications for fungal  infections, such as ketoconazole, posaconazole, voriconazole Certain medications for seizures, such as carbamazepine, phenobarbital, phenytoin Gemfibrozil Nefazodone St. John's wort This list may not describe all possible interactions. Give your health care provider a list of all the medicines, herbs, non-prescription drugs, or dietary supplements you use. Also tell them if you smoke, drink alcohol, or use illegal drugs. Some items may interact with your medicine. What should I watch for while using this medication? Your condition will be monitored carefully while you are receiving this medication. You may need blood work while taking this medication. This medication may make you feel generally unwell. This is not uncommon as chemotherapy can affect healthy cells as well as cancer cells. Report any side effects. Continue your course of treatment even though you feel ill unless your care team tells you to stop. This medication can cause serious side effects. To reduce the risk, your care team may give you other medications to take before receiving this one. Be sure to follow the directions from your care team. This medication may affect your coordination, reaction time, or judgement. Do not drive or operate machinery until you know how this medication affects you. Sit up or stand slowly to reduce the risk of dizzy or fainting spells. Drinking alcohol with this medication can increase the risk of these side effects. This medication may increase your risk of getting an infection. Call your care team for advice if you get a fever, chills, sore throat, or other symptoms of a cold or flu. Do not treat yourself. Try to avoid being around people who are sick. Avoid taking medications that contain aspirin, acetaminophen, ibuprofen, naproxen, or ketoprofen unless instructed by your care team. These medications may hide a fever. This medication may increase your risk to bruise or bleed. Call your care team if you  notice any unusual bleeding. Be careful brushing or flossing your teeth or using a toothpick because you may get an infection or bleed more easily. If you have any dental work done, tell your dentist you are receiving this medication. Talk to your care team if you or your partner are pregnant or think either of you might be pregnant. This medication can cause serious birth defects if taken during pregnancy and for 6 months after the last dose. You will need a negative pregnancy test before starting this medication. Contraception is recommended while taking this medication and for 6 months after the last dose. Your care team can help you find the option that works for you. Do not father a child while taking this medication and for 3 months after the last dose. Use a condom for contraception during this time period. Do not breastfeed while taking this medication and for 7 days after the last dose. This medication may cause infertility. Talk to your care team if you are concerned about your fertility. What side effects may I notice from receiving this medication? Side effects that you should report to your care team  as soon as possible: Allergic reactions--skin rash, itching, hives, swelling of the face, lips, tongue, or throat Dry cough, shortness of breath or trouble breathing Increased saliva or tears, increased sweating, stomach cramping, diarrhea, small pupils, unusual weakness or fatigue, slow heartbeat Infection--fever, chills, cough, sore throat, wounds that don't heal, pain or trouble when passing urine, general feeling of discomfort or being unwell Kidney injury--decrease in the amount of urine, swelling of the ankles, hands, or feet Low red blood cell level--unusual weakness or fatigue, dizziness, headache, trouble breathing Severe or prolonged diarrhea Unusual bruising or bleeding Side effects that usually do not require medical attention (report to your care team if they continue or are  bothersome): Constipation Diarrhea Hair loss Loss of appetite Nausea Stomach pain This list may not describe all possible side effects. Call your doctor for medical advice about side effects. You may report side effects to FDA at 1-800-FDA-1088. Where should I keep my medication? This medication is given in a hospital or clinic. It will not be stored at home. NOTE: This sheet is a summary. It may not cover all possible information. If you have questions about this medicine, talk to your doctor, pharmacist, or health care provider.  2024 Elsevier/Gold Standard (2021-07-19 00:00:00)  Leucovorin Injection What is this medication? LEUCOVORIN (loo koe VOR in) prevents side effects from certain medications, such as methotrexate. It works by increasing folate levels. This helps protect healthy cells in your body. It may also be used to treat anemia caused by low levels of folate. It can also be used with fluorouracil, a type of chemotherapy, to treat colorectal cancer. It works by increasing the effects of fluorouracil in the body. This medicine may be used for other purposes; ask your health care provider or pharmacist if you have questions. What should I tell my care team before I take this medication? They need to know if you have any of these conditions: Anemia from low levels of vitamin B12 in the blood An unusual or allergic reaction to leucovorin, folic acid, other medications, foods, dyes, or preservatives Pregnant or trying to get pregnant Breastfeeding How should I use this medication? This medication is injected into a vein or a muscle. It is given by your care team in a hospital or clinic setting. Talk to your care team about the use of this medication in children. Special care may be needed. Overdosage: If you think you have taken too much of this medicine contact a poison control center or emergency room at once. NOTE: This medicine is only for you. Do not share this medicine with  others. What if I miss a dose? Keep appointments for follow-up doses. It is important not to miss your dose. Call your care team if you are unable to keep an appointment. What may interact with this medication? Capecitabine Fluorouracil Phenobarbital Phenytoin Primidone Trimethoprim;sulfamethoxazole This list may not describe all possible interactions. Give your health care provider a list of all the medicines, herbs, non-prescription drugs, or dietary supplements you use. Also tell them if you smoke, drink alcohol, or use illegal drugs. Some items may interact with your medicine. What should I watch for while using this medication? Your condition will be monitored carefully while you are receiving this medication. This medication may increase the side effects of 5-fluorouracil. Tell your care team if you have diarrhea or mouth sores that do not get better or that get worse. What side effects may I notice from receiving this medication? Side effects that you should  report to your care team as soon as possible: Allergic reactions--skin rash, itching, hives, swelling of the face, lips, tongue, or throat This list may not describe all possible side effects. Call your doctor for medical advice about side effects. You may report side effects to FDA at 1-800-FDA-1088. Where should I keep my medication? This medication is given in a hospital or clinic. It will not be stored at home. NOTE: This sheet is a summary. It may not cover all possible information. If you have questions about this medicine, talk to your doctor, pharmacist, or health care provider.  2024 Elsevier/Gold Standard (2021-08-10 00:00:00)  Fluorouracil Injection What is this medication? FLUOROURACIL (flure oh YOOR a sil) treats some types of cancer. It works by slowing down the growth of cancer cells. This medicine may be used for other purposes; ask your health care provider or pharmacist if you have questions. COMMON BRAND  NAME(S): Adrucil What should I tell my care team before I take this medication? They need to know if you have any of these conditions: Blood disorders Dihydropyrimidine dehydrogenase (DPD) deficiency Infection, such as chickenpox, cold sores, herpes Kidney disease Liver disease Poor nutrition Recent or ongoing radiation therapy An unusual or allergic reaction to fluorouracil, other medications, foods, dyes, or preservatives If you or your partner are pregnant or trying to get pregnant Breast-feeding How should I use this medication? This medication is injected into a vein. It is administered by your care team in a hospital or clinic setting. Talk to your care team about the use of this medication in children. Special care may be needed. Overdosage: If you think you have taken too much of this medicine contact a poison control center or emergency room at once. NOTE: This medicine is only for you. Do not share this medicine with others. What if I miss a dose? Keep appointments for follow-up doses. It is important not to miss your dose. Call your care team if you are unable to keep an appointment. What may interact with this medication? Do not take this medication with any of the following: Live virus vaccines This medication may also interact with the following: Medications that treat or prevent blood clots, such as warfarin, enoxaparin, dalteparin This list may not describe all possible interactions. Give your health care provider a list of all the medicines, herbs, non-prescription drugs, or dietary supplements you use. Also tell them if you smoke, drink alcohol, or use illegal drugs. Some items may interact with your medicine. What should I watch for while using this medication? Your condition will be monitored carefully while you are receiving this medication. This medication may make you feel generally unwell. This is not uncommon as chemotherapy can affect healthy cells as well as  cancer cells. Report any side effects. Continue your course of treatment even though you feel ill unless your care team tells you to stop. In some cases, you may be given additional medications to help with side effects. Follow all directions for their use. This medication may increase your risk of getting an infection. Call your care team for advice if you get a fever, chills, sore throat, or other symptoms of a cold or flu. Do not treat yourself. Try to avoid being around people who are sick. This medication may increase your risk to bruise or bleed. Call your care team if you notice any unusual bleeding. Be careful brushing or flossing your teeth or using a toothpick because you may get an infection or bleed more easily.  If you have any dental work done, tell your dentist you are receiving this medication. Avoid taking medications that contain aspirin, acetaminophen, ibuprofen, naproxen, or ketoprofen unless instructed by your care team. These medications may hide a fever. Do not treat diarrhea with over the counter products. Contact your care team if you have diarrhea that lasts more than 2 days or if it is severe and watery. This medication can make you more sensitive to the sun. Keep out of the sun. If you cannot avoid being in the sun, wear protective clothing and sunscreen. Do not use sun lamps, tanning beds, or tanning booths. Talk to your care team if you or your partner wish to become pregnant or think you might be pregnant. This medication can cause serious birth defects if taken during pregnancy and for 3 months after the last dose. A reliable form of contraception is recommended while taking this medication and for 3 months after the last dose. Talk to your care team about effective forms of contraception. Do not father a child while taking this medication and for 3 months after the last dose. Use a condom while having sex during this time period. Do not breastfeed while taking this  medication. This medication may cause infertility. Talk to your care team if you are concerned about your fertility. What side effects may I notice from receiving this medication? Side effects that you should report to your care team as soon as possible: Allergic reactions--skin rash, itching, hives, swelling of the face, lips, tongue, or throat Heart attack--pain or tightness in the chest, shoulders, arms, or jaw, nausea, shortness of breath, cold or clammy skin, feeling faint or lightheaded Heart failure--shortness of breath, swelling of the ankles, feet, or hands, sudden weight gain, unusual weakness or fatigue Heart rhythm changes--fast or irregular heartbeat, dizziness, feeling faint or lightheaded, chest pain, trouble breathing High ammonia level--unusual weakness or fatigue, confusion, loss of appetite, nausea, vomiting, seizures Infection--fever, chills, cough, sore throat, wounds that don't heal, pain or trouble when passing urine, general feeling of discomfort or being unwell Low red blood cell level--unusual weakness or fatigue, dizziness, headache, trouble breathing Pain, tingling, or numbness in the hands or feet, muscle weakness, change in vision, confusion or trouble speaking, loss of balance or coordination, trouble walking, seizures Redness, swelling, and blistering of the skin over hands and feet Severe or prolonged diarrhea Unusual bruising or bleeding Side effects that usually do not require medical attention (report to your care team if they continue or are bothersome): Dry skin Headache Increased tears Nausea Pain, redness, or swelling with sores inside the mouth or throat Sensitivity to light Vomiting This list may not describe all possible side effects. Call your doctor for medical advice about side effects. You may report side effects to FDA at 1-800-FDA-1088. Where should I keep my medication? This medication is given in a hospital or clinic. It will not be stored  at home. NOTE: This sheet is a summary. It may not cover all possible information. If you have questions about this medicine, talk to your doctor, pharmacist, or health care provider.  2024 Elsevier/Gold Standard (2021-07-13 00:00:00)

## 2023-04-10 NOTE — Progress Notes (Signed)
Patient with complaints of diaphoresis and feeling hot.  Irinotecan and leucovorin stopped, line flushed with NS and atropine given per order (see MAR).  Irinotecan and leucovorin restarted and patient able to complete remainder of infusion without any further issues.  VSS upon leaving infusion room.

## 2023-04-10 NOTE — Progress Notes (Signed)
Set dose of Oxaliplatin to 180 mg (85 mg/m2) do not round to vial size per EPIC rounding rules.  T.O. Dr Mickle Asper, PharmD

## 2023-04-10 NOTE — Progress Notes (Signed)
Patient seen by Dr. Thornton Papas today  Vitals are within treatment parameters:Yes  Okay to tx with today's VS Labs are within treatment parameters: Yes  AWAITING CONFIRMATION OF SERUM PREGNANCY TEST TO START TX Treatment plan has been signed: Yes  AWAITING PHARMACY TO REDUCE OXALIPLATIN TO REDUCE TO 180MG . Per physician team, Patient is ready for treatment. Please note the following modifications:  OXALIPLATIN WILL BE REDUCED TO 180MG  AND SERUM PREGNANCY TEST TO RESULT BEFORE TX. MSG SENT TO PHARMACY.

## 2023-04-10 NOTE — Progress Notes (Addendum)
  [facility] OFFICE PROGRESS NOTE   Diagnosis: Pancreas cancer  INTERVAL HISTORY:   Debbie [name] returns as scheduled.  No recurrent abdominal pain.  She reports diminished taste.  The cath placement on 04/03/2023.  She attended a chemotherapy teaching class.  Objective:  Vital signs in last 24 hours:  Blood pressure 126/87, pulse 77, temperature 98.4 F (36.9 C), temperature source Temporal, resp. rate 18, height 5\' 3"  (1.6 m), weight 227 lb 4.8 oz (103.1 kg), SpO2 98%.    HEENT: No thrush Resp: Lungs clear bilaterally Cardio: Regular rate and rhythm GI: Mild tenderness in the right upper abdomen, no hepatosplenomegaly, no mass Vascular: No leg edema   Portacath/PICC-without erythema  Lab Results:  Lab Results  Component Value Date   WBC 7.4 04/10/2023   HGB 11.9 (L) 04/10/2023   HCT 34.2 (L) 04/10/2023   MCV 90.0 04/10/2023   PLT 254 04/10/2023   NEUTROABS 4.7 04/10/2023    CMP  Lab Results  Component Value Date   NA 140 04/10/2023   K 3.5 04/10/2023   CL 102 04/10/2023   CO2 27 04/10/2023   GLUCOSE 127 (H) 04/10/2023   BUN 13 04/10/2023   CREATININE 0.53 04/10/2023   CALCIUM 9.4 04/10/2023   PROT 7.2 04/10/2023   ALBUMIN 4.6 04/10/2023   AST 31 04/10/2023   ALT 24 04/10/2023   ALKPHOS 101 04/10/2023   BILITOT 0.6 04/10/2023   GFRNONAA >60 04/10/2023   GFRAA >60 10/03/2015    Lab Results  Component Value Date   [id number] 180,208 (H) 04/05/2023     Medications: I have reviewed the patient's current medications.   Assessment/Plan:  Pancreas mass, liver lesions CT abdomen/pelvis 03/24/2023: 3 x 4.6 cm pancreas tail mass, multiple liver lesions consistent with metastases, fat stranding surrounding the second and third ports of the duodenum and pancreas head/uncinate  CT chest 03/27/2023: New spiculated nodular density in the right lower lobe, 8 x 4 mm EUS 03/27/2023: Pancreas tail mass, liver lesions, T2 N0 M1, needle biopsy of pancreas mass,  adenocarcinoma Foundation 1: K-ras G 12R, HRD signature negative, MSS, tumor mutation burden 0, PD-L1 TPS 1% Elevated CA 19-9 (121,720 on 03/26/2023) Cycle 1 FOLFIRINOX 04/10/2023   2.  Diabetes 3.  Acute abdominal pain and nausea/vomiting 03/23/2023 secondary to pancreatitis.  Improved 03/31/2023 4.  Hypertension. 5.  Asthma 6.  Hypothyroidism 7.  Hyperlipidemia 8.  Acute pancreatitis 03/24/2023   Disposition: Debbie [name] has been diagnosed with metastatic pancreas cancer.  She will complete cycle 1 FOLFIRINOX today.  She will monitor her blood sugar after receiving Decadron and chemotherapy.  Debbie [name] will return for an office visit and chemotherapy in 2 weeks.  We reviewed results from GS testing.  We will check the CA 19-9 after 2 cycles of chemotherapy.  Thornton Papas, MD  04/10/2023  10:02 AM

## 2023-04-10 NOTE — Patient Instructions (Signed)

## 2023-04-11 ENCOUNTER — Telehealth: Payer: Self-pay

## 2023-04-11 NOTE — Telephone Encounter (Signed)
Called and spoke to patient for first time chemo follow-up.  Patient reported bout of nausea and diarrhea this am, but took home nausea med and it resolved.  Patient stated she is drinking fluids and has been eating small meals. Patient denied any other complaints.  Patient encouraged to call office with any future questions or concerns.  All questions were answered during phone call.

## 2023-04-12 ENCOUNTER — Inpatient Hospital Stay: Payer: 59

## 2023-04-12 ENCOUNTER — Other Ambulatory Visit: Payer: Self-pay | Admitting: Internal Medicine

## 2023-04-12 VITALS — BP 126/82 | HR 69 | Temp 98.4°F | Resp 18

## 2023-04-12 DIAGNOSIS — C259 Malignant neoplasm of pancreas, unspecified: Secondary | ICD-10-CM

## 2023-04-12 DIAGNOSIS — H66006 Acute suppurative otitis media without spontaneous rupture of ear drum, recurrent, bilateral: Secondary | ICD-10-CM

## 2023-04-12 DIAGNOSIS — Z5111 Encounter for antineoplastic chemotherapy: Secondary | ICD-10-CM | POA: Diagnosis not present

## 2023-04-12 MED ORDER — SODIUM CHLORIDE 0.9% FLUSH
10.0000 mL | INTRAVENOUS | Status: DC | PRN
  Administered 2023-04-12: 10 mL

## 2023-04-12 MED ORDER — HEPARIN SOD (PORK) LOCK FLUSH 100 UNIT/ML IV SOLN
500.0000 [IU] | Freq: Once | INTRAVENOUS | Status: AC | PRN
  Administered 2023-04-12: 500 [IU]

## 2023-04-12 NOTE — Patient Instructions (Signed)

## 2023-04-17 ENCOUNTER — Encounter: Payer: Self-pay | Admitting: Genetic Counselor

## 2023-04-17 ENCOUNTER — Ambulatory Visit: Payer: 59 | Admitting: Skilled Nursing Facility1

## 2023-04-17 DIAGNOSIS — Z1379 Encounter for other screening for genetic and chromosomal anomalies: Secondary | ICD-10-CM | POA: Insufficient documentation

## 2023-04-19 ENCOUNTER — Telehealth: Payer: Self-pay | Admitting: Genetic Counselor

## 2023-04-19 ENCOUNTER — Ambulatory Visit: Payer: Self-pay | Admitting: Genetic Counselor

## 2023-04-19 ENCOUNTER — Encounter: Payer: Self-pay | Admitting: Genetic Counselor

## 2023-04-19 DIAGNOSIS — C259 Malignant neoplasm of pancreas, unspecified: Secondary | ICD-10-CM

## 2023-04-19 DIAGNOSIS — Z1379 Encounter for other screening for genetic and chromosomal anomalies: Secondary | ICD-10-CM

## 2023-04-19 NOTE — Progress Notes (Signed)
HPI:  Debbie [name] had point of care testing, requested by her oncologist Dr. Truett Perna, due to a personal and family history of cancer and concerns regarding a hereditary predisposition to cancer. Debbie [name] recent genetic test results were disclosed to her by phone, as were recommendations warranted by these results. These results and recommendations are discussed in more detail below.  CANCER HISTORY:  Oncology History  Pancreatic adenocarcinoma (HCC)  03/30/2023 Initial Diagnosis   Pancreatic adenocarcinoma (HCC)   03/31/2023 Cancer Staging   Staging form: Exocrine Pancreas, AJCC 8th Edition - Clinical: Stage IV (cT3, cN0, cM1) - Signed by Ladene Artist, MD on 03/31/2023 Total positive nodes: 0   04/10/2023 -  Chemotherapy   Patient is on Treatment Plan : PANCREAS Modified FOLFIRINOX q14d x 4 cycles     04/14/2023 Genetic Testing   Negative [facility] CancerNext-Expanded +RNAinsight Panel.  Report date is 04/14/2023.   The CancerNext-Expanded gene panel offered by [facility] and includes sequencing, rearrangement, and RNA analysis for the following 76 genes: AIP, ALK, APC, ATM, AXIN2, BAP1, BARD1, BMPR1A, BRCA1, BRCA2, BRIP1, CDC73, CDH1, CDK4, CDKN1B, CDKN2A, CEBPA, CHEK2, CTNNA1, DDX41, DICER1, ETV6, FH, FLCN, GATA2, LZTR1, MAX, MBD4, MEN1, MET, MLH1, MSH2, MSH3, MSH6, MUTYH, NF1, NF2, NTHL1, PALB2, PHOX2B, PMS2, POT1, PRKAR1A, PTCH1, PTEN, RAD51C, RAD51D, RB1, RET, RUNX1, SDHA, SDHAF2, SDHB, SDHC, SDHD, SMAD4, SMARCA4, SMARCB1, SMARCE1, STK11, SUFU, TMEM127, TP53, TSC1, TSC2, VHL, and WT1 (sequencing and deletion/duplication); EGFR, HOXB13, KIT, MITF, PDGFRA, POLD1, and POLE (sequencing only); EPCAM and GREM1 (deletion/duplication only).       FAMILY HISTORY:  We obtained a detailed, 4-generation family history.  Significant diagnoses are listed below: Family History  Problem Relation Age of Onset   Hypertension Mother    Hyperlipidemia Mother    Heart disease Father    Cancer Maternal  Grandmother 41 - 39       metastatic, unknown priamry   Prostate cancer Maternal Grandfather 69 - 79   Diabetes Maternal Grandfather    CAD Maternal Grandfather    Breast cancer Maternal Great-grandmother 42 - 69   Colon cancer Neg Hx    Colon polyps Neg Hx    Esophageal cancer Neg Hx    Stomach cancer Neg Hx    Rectal cancer Neg Hx    Debbie [name] reports her maternal grandmother was diagnosed with metastatic cancer in her 54s, unknown primary (but family has expressed concern for possible GI or pancreatic primary). Her maternal great-grandmother was diagnosed with breast cancer in her 50s, deceased. Her maternal grandfather was diagnosed with prostate cancer in his 5s, deceased from unrelated causes. Debbie [name] does not report any additional history of cancer.     GENETIC TEST RESULTS: Genetic testing reported out on 04/14/23 through the [facility] 76 gene CancerNext-Expanded +RNAinsight panel found no pathogenic mutations. The CancerNext-Expanded gene panel offered by [facility] and includes sequencing, rearrangement, and RNA analysis for the following 76 genes: AIP, ALK, APC, ATM, AXIN2, BAP1, BARD1, BMPR1A, BRCA1, BRCA2, BRIP1, CDC73, CDH1, CDK4, CDKN1B, CDKN2A, CEBPA, CHEK2, CTNNA1, DDX41, DICER1, ETV6, FH, FLCN, GATA2, LZTR1, MAX, MBD4, MEN1, MET, MLH1, MSH2, MSH3, MSH6, MUTYH, NF1, NF2, NTHL1, PALB2, PHOX2B, PMS2, POT1, PRKAR1A, PTCH1, PTEN, RAD51C, RAD51D, RB1, RET, RUNX1, SDHA, SDHAF2, SDHB, SDHC, SDHD, SMAD4, SMARCA4, SMARCB1, SMARCE1, STK11, SUFU, TMEM127, TP53, TSC1, TSC2, VHL, and WT1 (sequencing and deletion/duplication); EGFR, HOXB13, KIT, MITF, PDGFRA, POLD1, and POLE (sequencing only); EPCAM and GREM1 (deletion/duplication only). The test report has been scanned into EPIC and is located under  the Molecular Pathology section of the Results Review tab.  A portion of the result report is included below for reference.     We discussed with Debbie [name] that because current genetic  testing is not perfect, it is possible there may be a gene mutation in one of these genes that current testing cannot detect, but that chance is small.  We also discussed, that there could be another gene that has not yet been discovered, or that we have not yet tested, that is responsible for the cancer diagnoses in the family. It is also possible there is a hereditary cause for the cancer in the family that Debbie [name] did not inherit and therefore was not identified in her testing.  Therefore, it is important to remain in touch with cancer genetics in the future so that we can continue to offer Debbie [name] the most up to date genetic testing.   ADDITIONAL GENETIC TESTING: We discussed with Debbie [name] that her genetic testing was fairly extensive.  If there are genes identified to increase cancer risk that can be analyzed in the future, we would be happy to discuss and coordinate this testing at that time.    CANCER SCREENING RECOMMENDATIONS: Debbie [name] test result is considered negative (normal).  This means that we have not identified a hereditary cause for her personal and family history of cancer at this time. Most cancers happen by chance and this negative test suggests that her personal and family history of cancer may fall into this category.    Possible reasons for Debbie [name]'s negative genetic test include:  1. There may be a gene mutation in one of these genes that current testing methods cannot detect but that chance is small.  2. There could be another gene that has not yet been discovered, or that we have not yet tested, that is responsible for the cancer diagnoses in the family.  3.  There may be no hereditary risk for cancer in the family. The cancers in Debbie [name] and/or her family may be sporadic/familial or due to other genetic and environmental factors. 4. It is also possible there is a hereditary cause for the cancer in the family that Debbie [name] did not inherit.  Therefore, it is  recommended she continue to follow the cancer management and screening guidelines provided by her oncology and primary healthcare provider. An individual's cancer risk and medical management are not determined by genetic test results alone. Overall cancer risk assessment incorporates additional factors, including personal medical history, family history, and any available genetic information that may result in a personalized plan for cancer prevention and surveillance  RECOMMENDATIONS FOR FAMILY MEMBERS:  Individuals in this family might be at some increased risk of developing cancer, over the general population risk, simply due to the family history of cancer.  We recommended women in this family have a yearly mammogram beginning at age 52, or 57 years younger than the earliest onset of cancer, an annual clinical breast exam, and perform monthly breast self-exams. Women in this family should also have a gynecological exam as recommended by their primary provider. All family members should be referred for colonoscopy starting at age 45, or 44 years younger than the earliest onset of cancer.  FOLLOW-UP: Lastly, we discussed with Debbie [name] that cancer genetics is a rapidly advancing field and it is possible that new genetic tests will be appropriate for her and/or her family members in the future. We encouraged her to remain in contact  with cancer genetics on an annual basis so we can update her personal and family histories and let her know of advances in cancer genetics that may benefit this family.   Our contact number was provided. Debbie [name] questions were answered to her satisfaction, and she knows she is welcome to call us at anytime with additional questions or concerns.   Vassie Moment, MS, [facility] Licensed, Retail [email or web address] [phone]

## 2023-04-19 NOTE — Telephone Encounter (Signed)
I spoke to Debbie [name] to review results of genetic testing. Point of care genetic testing requested by Dr. Truett Perna. Testing completed with [facility]'s CancerNext-Expanded +RNAinsight panel. Testing did not identify any variants known to increase the risk for cancer. Called to determine if [name] would prefer to review results by phone or to keep genetic counseling visit scheduled for Friday. Elected to discuss by phone today, will cancel appointment for Friday. Please see counseling note for further detail on this result.

## 2023-04-21 ENCOUNTER — Other Ambulatory Visit: Payer: 59

## 2023-04-21 ENCOUNTER — Encounter: Payer: 59 | Admitting: Genetic Counselor

## 2023-04-23 ENCOUNTER — Other Ambulatory Visit: Payer: Self-pay | Admitting: Oncology

## 2023-04-24 ENCOUNTER — Inpatient Hospital Stay: Payer: No Typology Code available for payment source

## 2023-04-24 ENCOUNTER — Other Ambulatory Visit: Payer: Self-pay | Admitting: Internal Medicine

## 2023-04-24 ENCOUNTER — Other Ambulatory Visit: Payer: Self-pay | Admitting: *Deleted

## 2023-04-24 ENCOUNTER — Inpatient Hospital Stay: Payer: No Typology Code available for payment source | Attending: Nurse Practitioner

## 2023-04-24 ENCOUNTER — Inpatient Hospital Stay (HOSPITAL_BASED_OUTPATIENT_CLINIC_OR_DEPARTMENT_OTHER): Payer: No Typology Code available for payment source | Admitting: Oncology

## 2023-04-24 VITALS — BP 123/78 | HR 75 | Temp 97.7°F | Resp 18 | Ht 63.0 in | Wt 227.0 lb

## 2023-04-24 DIAGNOSIS — E785 Hyperlipidemia, unspecified: Secondary | ICD-10-CM | POA: Insufficient documentation

## 2023-04-24 DIAGNOSIS — J45909 Unspecified asthma, uncomplicated: Secondary | ICD-10-CM | POA: Insufficient documentation

## 2023-04-24 DIAGNOSIS — E119 Type 2 diabetes mellitus without complications: Secondary | ICD-10-CM | POA: Insufficient documentation

## 2023-04-24 DIAGNOSIS — E039 Hypothyroidism, unspecified: Secondary | ICD-10-CM | POA: Diagnosis not present

## 2023-04-24 DIAGNOSIS — C252 Malignant neoplasm of tail of pancreas: Secondary | ICD-10-CM | POA: Insufficient documentation

## 2023-04-24 DIAGNOSIS — Z5111 Encounter for antineoplastic chemotherapy: Secondary | ICD-10-CM | POA: Diagnosis present

## 2023-04-24 DIAGNOSIS — Z79631 Long term (current) use of antimetabolite agent: Secondary | ICD-10-CM | POA: Diagnosis not present

## 2023-04-24 DIAGNOSIS — R978 Other abnormal tumor markers: Secondary | ICD-10-CM | POA: Diagnosis not present

## 2023-04-24 DIAGNOSIS — C259 Malignant neoplasm of pancreas, unspecified: Secondary | ICD-10-CM | POA: Diagnosis not present

## 2023-04-24 DIAGNOSIS — Z5189 Encounter for other specified aftercare: Secondary | ICD-10-CM | POA: Insufficient documentation

## 2023-04-24 DIAGNOSIS — Z79630 Long term (current) use of alkylating agent: Secondary | ICD-10-CM | POA: Diagnosis not present

## 2023-04-24 DIAGNOSIS — Z79634 Long term (current) use of topoisomerase inhibitor: Secondary | ICD-10-CM | POA: Insufficient documentation

## 2023-04-24 DIAGNOSIS — C787 Secondary malignant neoplasm of liver and intrahepatic bile duct: Secondary | ICD-10-CM | POA: Insufficient documentation

## 2023-04-24 LAB — CBC WITH DIFFERENTIAL (CANCER CENTER ONLY)
Abs Immature Granulocytes: 0 10*3/uL (ref 0.00–0.07)
Basophils Absolute: 0 10*3/uL (ref 0.0–0.1)
Basophils Relative: 0 %
Eosinophils Absolute: 0.1 10*3/uL (ref 0.0–0.5)
Eosinophils Relative: 7 %
HCT: 31 % — ABNORMAL LOW (ref 36.0–46.0)
Hemoglobin: 10.6 g/dL — ABNORMAL LOW (ref 12.0–15.0)
Immature Granulocytes: 0 %
Lymphocytes Relative: 73 %
Lymphs Abs: 1.5 10*3/uL (ref 0.7–4.0)
MCH: 31 pg (ref 26.0–34.0)
MCHC: 34.2 g/dL (ref 30.0–36.0)
MCV: 90.6 fL (ref 80.0–100.0)
Monocytes Absolute: 0.3 10*3/uL (ref 0.1–1.0)
Monocytes Relative: 16 %
Neutro Abs: 0.1 10*3/uL — CL (ref 1.7–7.7)
Neutrophils Relative %: 4 %
Platelet Count: 136 10*3/uL — ABNORMAL LOW (ref 150–400)
RBC: 3.42 MIL/uL — ABNORMAL LOW (ref 3.87–5.11)
RDW: 14.1 % (ref 11.5–15.5)
Smear Review: NORMAL
WBC Count: 2 10*3/uL — ABNORMAL LOW (ref 4.0–10.5)
nRBC: 0 % (ref 0.0–0.2)

## 2023-04-24 LAB — CMP (CANCER CENTER ONLY)
ALT: 22 U/L (ref 0–44)
AST: 27 U/L (ref 15–41)
Albumin: 4.1 g/dL (ref 3.5–5.0)
Alkaline Phosphatase: 91 U/L (ref 38–126)
Anion gap: 13 (ref 5–15)
BUN: 10 mg/dL (ref 6–20)
CO2: 27 mmol/L (ref 22–32)
Calcium: 9.7 mg/dL (ref 8.9–10.3)
Chloride: 102 mmol/L (ref 98–111)
Creatinine: 0.6 mg/dL (ref 0.44–1.00)
GFR, Estimated: >60 mL/min (ref >60)
Glucose, Bld: 154 mg/dL — ABNORMAL HIGH (ref 70–99)
Potassium: 3.2 mmol/L — ABNORMAL LOW (ref 3.5–5.1)
Sodium: 142 mmol/L (ref 135–145)
Total Bilirubin: 0.5 mg/dL (ref 0.0–1.2)
Total Protein: 7 g/dL (ref 6.5–8.1)

## 2023-04-24 LAB — HCG, SERUM, QUALITATIVE: Preg, Serum: NEGATIVE

## 2023-04-24 MED ORDER — POTASSIUM CHLORIDE CRYS ER 20 MEQ PO TBCR: 20.0000 meq | EXTENDED_RELEASE_TABLET | Freq: Every day | ORAL | 1 refills | Status: DC

## 2023-04-24 MED ORDER — SODIUM CHLORIDE 0.9% FLUSH
10.0000 mL | INTRAVENOUS | Status: DC | PRN
  Administered 2023-04-24: 10 mL via INTRAVENOUS

## 2023-04-24 MED ORDER — LEVOFLOXACIN 500 MG PO TABS: 500.0000 mg | ORAL_TABLET | Freq: Every day | ORAL | 0 refills | Status: DC

## 2023-04-24 MED ORDER — HEPARIN SOD (PORK) LOCK FLUSH 100 UNIT/ML IV SOLN
500.0000 [IU] | Freq: Once | INTRAVENOUS | Status: AC
  Administered 2023-04-24: 500 [IU] via INTRAVENOUS

## 2023-04-24 NOTE — Progress Notes (Signed)
  [facility] OFFICE PROGRESS NOTE   Diagnosis: Pancreas cancer  INTERVAL HISTORY:   Debbie [name] completed cycle 1 FOLFIRINOX 04/10/2023.  She reports mild nausea following chemotherapy.  No emesis.  No mouth sores.  1 episode of diarrhea.  Cold sensitivity lasted a few days.  No abdominal pain.  Her blood sugar did not go over 200.  Objective:  Vital signs in last 24 hours:  Blood pressure 123/78, pulse 75, temperature 97.7 F (36.5 C), temperature source Temporal, resp. rate 18, height 5\' 3"  (1.6 m), weight 227 lb (103 kg), SpO2 98%.    HEENT: No thrush or ulcers Resp: Lungs clear bilaterally Cardio: Regular rate and rhythm GI: No hepatosplenomegaly, nontender, no mass  Vascular: No leg edema  Skin: Palms without erythema  Portacath/PICC-without erythema  Lab Results:  Lab Results  Component Value Date   WBC 2.0 (L) 04/24/2023   HGB 10.6 (L) 04/24/2023   HCT 31.0 (L) 04/24/2023   MCV 90.6 04/24/2023   PLT PENDING 04/24/2023   NEUTROABS PENDING 04/24/2023    CMP  Lab Results  Component Value Date   NA 140 04/10/2023   K 3.5 04/10/2023   CL 102 04/10/2023   CO2 27 04/10/2023   GLUCOSE 127 (H) 04/10/2023   BUN 13 04/10/2023   CREATININE 0.53 04/10/2023   CALCIUM 9.4 04/10/2023   PROT 7.2 04/10/2023   ALBUMIN 4.6 04/10/2023   AST 31 04/10/2023   ALT 24 04/10/2023   ALKPHOS 101 04/10/2023   BILITOT 0.6 04/10/2023   GFRNONAA >60 04/10/2023   GFRAA >60 10/03/2015    Lab Results  Component Value Date   [id number] 180,208 (H) 04/05/2023     Medications: I have reviewed the patient's current medications.   Assessment/Plan: Pancreas cancer CT abdomen/pelvis 03/24/2023: 3 x 4.6 cm pancreas tail mass, multiple liver lesions consistent with metastases, fat stranding surrounding the second and third ports of the duodenum and pancreas head/uncinate  CT chest 03/27/2023: New spiculated nodular density in the right lower lobe, 8 x 4 mm EUS 03/27/2023:  Pancreas tail mass, liver lesions, T2 N0 M1, needle biopsy of pancreas mass, adenocarcinoma Foundation 1: K-ras G 12R, HRD signature negative, MSS, tumor mutation burden 0, PD-L1 TPS 1% Elevated CA 19-9 (121,720 on 03/26/2023) Cycle 1 FOLFIRINOX 04/10/2023   2.  Diabetes 3.  Acute abdominal pain and nausea/vomiting 03/23/2023 secondary to pancreatitis.  Improved 03/31/2023 4.  Hypertension. 5.  Asthma 6.  Hypothyroidism 7.  Hyperlipidemia 8.  Acute pancreatitis 03/24/2023     Disposition: Debbie [name] tolerated the first cycle of FOLFIRINOX well.  She is scheduled to complete cycle 2 today.  The white count is low and the platelet count is pending.  The white cell differential is pending.  Chemotherapy will be held as indicated.  G-CSF will be added as indicated.  We reviewed potential toxicities associated with G-CSF including the chance of bone pain, rash, and splenic rupture.  She agrees to proceed.  She will return for an office visit prior to cycle 3 FOLFIRINOX.  Thornton Papas, MD  04/24/2023  8:44 AM Addendum:  She has severe neutropenia.  She will begin Levaquin prophylaxis.  She has mild thrombocytopenia.  She will call for a fever or bleeding.  Cycle 2 FOLFIRINOX will be rescheduled to next week.

## 2023-04-24 NOTE — Progress Notes (Signed)
Notified patient that K+ level is low and MD has ordered KCl 20 meq daily. This has been sent to her pharmacy to pick up with the Levaquin.

## 2023-04-24 NOTE — Progress Notes (Signed)
CRITICAL VALUE STICKER  CRITICAL VALUE: ANC 0.1  RECEIVER (on-site recipient of call):Neya Creegan,RN  DATE & TIME NOTIFIED: 04/24/23 @ 0850  MESSENGER (representative from lab):Maurine Minister  MD NOTIFIED: Dr. Truett Perna  TIME OF NOTIFICATION:0855  RESPONSE:  Hold tx today. Review neutropenic precautions; start Levaquin 500 mg every day for 7 days. Check CBC on 2/6. Reschedule chemo for next week w/Udenyca added.

## 2023-04-25 ENCOUNTER — Encounter: Payer: Self-pay | Admitting: Oncology

## 2023-04-27 ENCOUNTER — Telehealth: Payer: Self-pay

## 2023-04-27 ENCOUNTER — Inpatient Hospital Stay: Payer: 59

## 2023-04-27 DIAGNOSIS — C259 Malignant neoplasm of pancreas, unspecified: Secondary | ICD-10-CM

## 2023-04-27 DIAGNOSIS — Z5111 Encounter for antineoplastic chemotherapy: Secondary | ICD-10-CM | POA: Diagnosis not present

## 2023-04-27 LAB — CBC WITH DIFFERENTIAL (CANCER CENTER ONLY)
Abs Immature Granulocytes: 0.01 10*3/uL (ref 0.00–0.07)
Basophils Absolute: 0 10*3/uL (ref 0.0–0.1)
Basophils Relative: 0 %
Eosinophils Absolute: 0.2 10*3/uL (ref 0.0–0.5)
Eosinophils Relative: 6 %
HCT: 34.5 % — ABNORMAL LOW (ref 36.0–46.0)
Hemoglobin: 11.6 g/dL — ABNORMAL LOW (ref 12.0–15.0)
Immature Granulocytes: 0 %
Lymphocytes Relative: 67 %
Lymphs Abs: 1.9 10*3/uL (ref 0.7–4.0)
MCH: 31 pg (ref 26.0–34.0)
MCHC: 33.6 g/dL (ref 30.0–36.0)
MCV: 92.2 fL (ref 80.0–100.0)
Monocytes Absolute: 0.6 10*3/uL (ref 0.1–1.0)
Monocytes Relative: 20 %
Neutro Abs: 0.2 10*3/uL — CL (ref 1.7–7.7)
Neutrophils Relative %: 7 %
Platelet Count: 251 10*3/uL (ref 150–400)
RBC: 3.74 MIL/uL — ABNORMAL LOW (ref 3.87–5.11)
RDW: 14.6 % (ref 11.5–15.5)
WBC Count: 2.9 10*3/uL — ABNORMAL LOW (ref 4.0–10.5)
nRBC: 0 % (ref 0.0–0.2)

## 2023-04-27 MED ORDER — INSULIN GLARGINE 100 UNIT/ML SOLOSTAR PEN: 20.0000 [IU] | PEN_INJECTOR | Freq: Every day | SUBCUTANEOUS | 0 refills | Status: DC

## 2023-04-27 NOTE — Telephone Encounter (Signed)
 I contacted the patient to inform her that her ANC is 0.2 and that it is acceptable to continue with Levaquin  for 7 days. I advised her to call the office if she experiences any fever. The patient acknowledged this information verbally and had no further questions or concerns.

## 2023-04-28 MED FILL — Fosaprepitant Dimeglumine For IV Infusion 150 MG (Base Eq): INTRAVENOUS | Qty: 5 | Status: AC

## 2023-05-01 ENCOUNTER — Inpatient Hospital Stay: Payer: No Typology Code available for payment source

## 2023-05-01 NOTE — Progress Notes (Signed)
 [facility] CSW Progress Note  Visual merchandiser  contacted patient by phone  to set up Journalist, newspaper.    Maudie Sorrow, LCSW Clinical Social Worker [facility]

## 2023-05-04 ENCOUNTER — Inpatient Hospital Stay: Payer: 59

## 2023-05-04 ENCOUNTER — Inpatient Hospital Stay: Payer: No Typology Code available for payment source

## 2023-05-04 ENCOUNTER — Encounter: Payer: Self-pay | Admitting: Nurse Practitioner

## 2023-05-04 ENCOUNTER — Other Ambulatory Visit: Payer: Self-pay | Admitting: Nurse Practitioner

## 2023-05-04 ENCOUNTER — Inpatient Hospital Stay (HOSPITAL_BASED_OUTPATIENT_CLINIC_OR_DEPARTMENT_OTHER): Payer: No Typology Code available for payment source | Admitting: Nurse Practitioner

## 2023-05-04 VITALS — BP 128/88 | HR 86 | Temp 98.2°F | Resp 18 | Ht 63.0 in | Wt 229.9 lb

## 2023-05-04 VITALS — BP 152/93 | HR 87

## 2023-05-04 DIAGNOSIS — Z5111 Encounter for antineoplastic chemotherapy: Secondary | ICD-10-CM | POA: Diagnosis not present

## 2023-05-04 DIAGNOSIS — C259 Malignant neoplasm of pancreas, unspecified: Secondary | ICD-10-CM

## 2023-05-04 LAB — CMP (CANCER CENTER ONLY)
ALT: 26 U/L (ref 0–44)
AST: 34 U/L (ref 15–41)
Albumin: 4.4 g/dL (ref 3.5–5.0)
Alkaline Phosphatase: 98 U/L (ref 38–126)
Anion gap: 11 (ref 5–15)
BUN: 11 mg/dL (ref 6–20)
CO2: 26 mmol/L (ref 22–32)
Calcium: 9.6 mg/dL (ref 8.9–10.3)
Chloride: 102 mmol/L (ref 98–111)
Creatinine: 0.61 mg/dL (ref 0.44–1.00)
GFR, Estimated: >60 mL/min (ref >60)
Glucose, Bld: 135 mg/dL — ABNORMAL HIGH (ref 70–99)
Potassium: 3.7 mmol/L (ref 3.5–5.1)
Sodium: 139 mmol/L (ref 135–145)
Total Bilirubin: 0.6 mg/dL (ref 0.0–1.2)
Total Protein: 7.2 g/dL (ref 6.5–8.1)

## 2023-05-04 LAB — CBC WITH DIFFERENTIAL (CANCER CENTER ONLY)
Abs Immature Granulocytes: 0.05 10*3/uL (ref 0.00–0.07)
Basophils Absolute: 0.1 10*3/uL (ref 0.0–0.1)
Basophils Relative: 1 %
Eosinophils Absolute: 0.2 10*3/uL (ref 0.0–0.5)
Eosinophils Relative: 3 %
HCT: 33.3 % — ABNORMAL LOW (ref 36.0–46.0)
Hemoglobin: 11.4 g/dL — ABNORMAL LOW (ref 12.0–15.0)
Immature Granulocytes: 1 %
Lymphocytes Relative: 31 %
Lymphs Abs: 2.6 10*3/uL (ref 0.7–4.0)
MCH: 31.7 pg (ref 26.0–34.0)
MCHC: 34.2 g/dL (ref 30.0–36.0)
MCV: 92.5 fL (ref 80.0–100.0)
Monocytes Absolute: 0.8 10*3/uL (ref 0.1–1.0)
Monocytes Relative: 10 %
Neutro Abs: 4.5 10*3/uL (ref 1.7–7.7)
Neutrophils Relative %: 54 %
Platelet Count: 299 10*3/uL (ref 150–400)
RBC: 3.6 MIL/uL — ABNORMAL LOW (ref 3.87–5.11)
RDW: 15.2 % (ref 11.5–15.5)
WBC Count: 8.2 10*3/uL (ref 4.0–10.5)
nRBC: 0.2 % (ref 0.0–0.2)

## 2023-05-04 MED ORDER — DEXTROSE 5 % IV SOLN: INTRAVENOUS | Status: DC

## 2023-05-04 MED ORDER — SODIUM CHLORIDE 0.9 % IV SOLN
150.0000 mg | Freq: Once | INTRAVENOUS | Status: AC
  Administered 2023-05-04: 150 mg via INTRAVENOUS
  Filled 2023-05-04: qty 150

## 2023-05-04 MED ORDER — SODIUM CHLORIDE 0.9 % IV SOLN
2400.0000 mg/m2 | INTRAVENOUS | Status: DC
  Administered 2023-05-04: 5000 mg via INTRAVENOUS
  Filled 2023-05-04: qty 100

## 2023-05-04 MED ORDER — ATROPINE SULFATE 1 MG/ML IV SOLN
0.5000 mg | Freq: Once | INTRAVENOUS | Status: AC | PRN
  Administered 2023-05-04: 0.5 mg via INTRAVENOUS
  Filled 2023-05-04: qty 1

## 2023-05-04 MED ORDER — DEXAMETHASONE SODIUM PHOSPHATE 10 MG/ML IJ SOLN
10.0000 mg | Freq: Once | INTRAMUSCULAR | Status: AC
  Administered 2023-05-04: 10 mg via INTRAVENOUS
  Filled 2023-05-04: qty 1

## 2023-05-04 MED ORDER — PROCHLORPERAZINE EDISYLATE 10 MG/2ML IJ SOLN
10.0000 mg | Freq: Once | INTRAMUSCULAR | Status: AC
  Administered 2023-05-04: 10 mg via INTRAVENOUS
  Filled 2023-05-04: qty 2

## 2023-05-04 MED ORDER — OXALIPLATIN CHEMO INJECTION 100 MG/20ML
83.0000 mg/m2 | Freq: Once | INTRAVENOUS | Status: AC
  Administered 2023-05-04: 180 mg via INTRAVENOUS
  Filled 2023-05-04: qty 36

## 2023-05-04 MED ORDER — SODIUM CHLORIDE 0.9 % IV SOLN
150.0000 mg/m2 | Freq: Once | INTRAVENOUS | Status: AC
  Administered 2023-05-04: 320 mg via INTRAVENOUS
  Filled 2023-05-04: qty 15

## 2023-05-04 MED ORDER — SODIUM CHLORIDE 0.9% FLUSH
10.0000 mL | INTRAVENOUS | Status: DC | PRN
Start: 2023-05-04 — End: 2023-05-04
  Administered 2023-05-04: 10 mL via INTRAVENOUS

## 2023-05-04 MED ORDER — SODIUM CHLORIDE 0.9 % IV SOLN
400.0000 mg/m2 | Freq: Once | INTRAVENOUS | Status: AC
  Administered 2023-05-04: 856 mg via INTRAVENOUS
  Filled 2023-05-04: qty 42.8

## 2023-05-04 MED ORDER — PALONOSETRON HCL INJECTION 0.25 MG/5ML
0.2500 mg | Freq: Once | INTRAVENOUS | Status: AC
  Administered 2023-05-04: 0.25 mg via INTRAVENOUS
  Filled 2023-05-04: qty 5

## 2023-05-04 NOTE — Progress Notes (Signed)
Pt complaining of nausea. Lonna Cobb, NP notified. Order received

## 2023-05-04 NOTE — Progress Notes (Signed)
  [facility] OFFICE PROGRESS NOTE   Diagnosis: Pancreas cancer  INTERVAL HISTORY:   Ms. Cavazos returns as scheduled.  She completed cycle 1 FOLFIRINOX 04/10/2023.  Cycle 2 has been on hold due to neutropenia.  Overall she is feeling well.  She had mild nausea after the first cycle of chemotherapy lasting about 3 days.  No vomiting.  No mouth sores.  No significant diarrhea.  Cold sensitivity resolved over a few days.  No numbness or tingling in the absence of cold exposure.  No hand or foot pain or redness.  No abdominal pain.  Objective:  Vital signs in last 24 hours:  Blood pressure 128/88, pulse 86, temperature 98.2 F (36.8 C), temperature source Temporal, resp. rate 18, height 5\' 3"  (1.6 m), weight 229 lb 14.4 oz (104.3 kg), SpO2 98%.    HEENT: No thrush or ulcers. Resp: Lungs clear bilaterally. Cardio: Regular rate and rhythm. GI: Abdomen soft and nontender.  No hepatosplenomegaly.  No mass. Vascular: No leg edema. Skin: No rash. Port-A-Cath without erythema.  Lab Results:  Lab Results  Component Value Date   WBC 8.2 05/04/2023   HGB 11.4 (L) 05/04/2023   HCT 33.3 (L) 05/04/2023   MCV 92.5 05/04/2023   PLT 299 05/04/2023   NEUTROABS 4.5 05/04/2023    Imaging:  No results found.  Medications: I have reviewed the patient's current medications.  Assessment/Plan: Pancreas cancer CT abdomen/pelvis 03/24/2023: 3 x 4.6 cm pancreas tail mass, multiple liver lesions consistent with metastases, fat stranding surrounding the second and third ports of the duodenum and pancreas head/uncinate  CT chest 03/27/2023: New spiculated nodular density in the right lower lobe, 8 x 4 mm EUS 03/27/2023: Pancreas tail mass, liver lesions, T2 N0 M1, needle biopsy of pancreas mass, adenocarcinoma Foundation 1: K-ras G 12R, HRD signature negative, MSS, tumor mutation burden 0, PD-L1 TPS 1% Elevated CA 19-9 (121,720 on 03/26/2023) Cycle 1 FOLFIRINOX 04/10/2023 Chemotherapy held  04/24/2023 due to neutropenia Cycle 2 FOLFIRINOX 05/04/2023, Udenyca   2.  Diabetes 3.  Acute abdominal pain and nausea/vomiting 03/23/2023 secondary to pancreatitis.  Improved 03/31/2023 4.  Hypertension. 5.  Asthma 6.  Hypothyroidism 7.  Hyperlipidemia 8.  Acute pancreatitis 03/24/2023  Disposition: Ms. Ammon appears stable.  She has completed 1 cycle of FOLFIRINOX.  Cycle 2 was held due to neutropenia.  The neutrophil count is adequate for treatment today.  Plan to proceed with cycle 2 FOLFIRINOX today as scheduled.  She will receive white cell growth factor support on the day of pump discontinuation.  We reviewed potential side effects including bone pain, rash, splenic rupture.  She agrees to proceed.  CBC and chemistry panel reviewed.  Labs adequate to proceed as above.  She will return for follow-up and treatment on 05/22/2023.  We are available to see her sooner if needed.    Lonna Cobb ANP/GNP-BC   05/04/2023  8:41 AM

## 2023-05-04 NOTE — Patient Instructions (Addendum)
[facility]   Discharge Instructions: The chemotherapy medication bag should finish at 46 hours, 96 hours, or 7 days. For example, if your pump is scheduled for 46 hours and it was put on at 4:00 p.m., it should finish at 2:00 p.m. the day it is scheduled to come off regardless of your appointment time.     Estimated time to finish at 1:30 SATURDAY, May 06, 2023.   If the display on your pump reads "Low Volume" and it is beeping, take the batteries out of the pump and come to the cancer center for it to be taken off.   If the pump alarms go off prior to the pump reading "Low Volume" then call [phone] and someone can assist you.  If the plunger comes out and the chemotherapy medication is leaking out, please use your home chemo spill kit to clean up the spill. Do NOT use paper towels or other household products.  If you have problems or questions regarding your pump, please call either [phone] (24 hours a day) or the cancer center Monday-Friday 8:00 a.m.- 4:30 p.m. at the clinic number and we will assist you. If you are unable to get assistance, then go to the nearest Emergency Department and ask the staff to contact the IV team for assistance.   Thank you for choosing [facility] to provide your oncology and hematology care.   If you have a lab appointment with the Cancer Center, please go directly to the Cancer Center and check in at the registration area.   Wear comfortable clothing and clothing appropriate for easy access to any Portacath or PICC line.   We strive to give you quality time with your provider. You may need to reschedule your appointment if you arrive late (15 or more minutes).  Arriving late affects you and other patients whose appointments are after yours.  Also, if you miss three or more appointments without notifying the office, you may be dismissed from the clinic at the provider's  discretion.      For prescription refill requests, have your pharmacy contact our office and allow 72 hours for refills to be completed.    Today you received the following chemotherapy and/or immunotherapy agents OXALIPLATIN/LEUCOVORIN IRINOTECAN/FLUOROURACIL      To help prevent nausea and vomiting after your treatment, we encourage you to take your nausea medication as directed.  BELOW ARE SYMPTOMS THAT SHOULD BE REPORTED IMMEDIATELY: *FEVER GREATER THAN 100.4 F (38 C) OR HIGHER *CHILLS OR SWEATING *NAUSEA AND VOMITING THAT IS NOT CONTROLLED WITH YOUR NAUSEA MEDICATION *UNUSUAL SHORTNESS OF BREATH *UNUSUAL BRUISING OR BLEEDING *URINARY PROBLEMS (pain or burning when urinating, or frequent urination) *BOWEL PROBLEMS (unusual diarrhea, constipation, pain near the anus) TENDERNESS IN MOUTH AND THROAT WITH OR WITHOUT PRESENCE OF ULCERS (sore throat, sores in mouth, or a toothache) UNUSUAL RASH, SWELLING OR PAIN  UNUSUAL VAGINAL DISCHARGE OR ITCHING   Items with * indicate a potential emergency and should be followed up as soon as possible or go to the Emergency Department if any problems should occur.  Please show the CHEMOTHERAPY ALERT CARD or IMMUNOTHERAPY ALERT CARD at check-in to the Emergency Department and triage nurse.  Should you have questions after your visit or need to cancel or reschedule your appointment, please contact [facility]  Dept: [phone]  and follow the prompts.  Office  hours are 8:00 a.m. to 4:30 p.m. Monday - Friday. Please note that voicemails left after 4:00 p.m. may not be returned until the following business day.  We are closed weekends and major holidays. You have access to a nurse at all times for urgent questions. Please call the main number to the clinic Dept: [phone] and follow the prompts.   For any non-urgent questions, you may also contact your provider using MyChart. We now offer  e-Visits for anyone 38 and older to request care online for non-urgent symptoms. For details visit [email or web address].   Also download the MyChart app! Go to the app store, search "MyChart", open the app, select [facility], and log in with your MyChart username and password.  Oxaliplatin Injection What is this medication? OXALIPLATIN (ox AL i PLA tin) treats colorectal cancer. It works by slowing down the growth of cancer cells. This medicine may be used for other purposes; ask your health care provider or pharmacist if you have questions. COMMON BRAND NAME(S): Eloxatin What should I tell my care team before I take this medication? They need to know if you have any of these conditions: Heart disease History of irregular heartbeat or rhythm Liver disease Low blood cell levels (white cells, red cells, and platelets) Lung or breathing disease, such as asthma Take medications that treat or prevent blood clots Tingling of the fingers, toes, or other nerve disorder An unusual or allergic reaction to oxaliplatin, other medications, foods, dyes, or preservatives If you or your partner are pregnant or trying to get pregnant Breast-feeding How should I use this medication? This medication is injected into a vein. It is given by your care team in a hospital or clinic setting. Talk to your care team about the use of this medication in children. Special care may be needed. Overdosage: If you think you have taken too much of this medicine contact a poison control center or emergency room at once. NOTE: This medicine is only for you. Do not share this medicine with others. What if I miss a dose? Keep appointments for follow-up doses. It is important not to miss a dose. Call your care team if you are unable to keep an appointment. What may interact with this medication? Do not take this medication with any of the following: Cisapride Dronedarone Pimozide Thioridazine This medication may also  interact with the following: Aspirin and aspirin-like medications Certain medications that treat or prevent blood clots, such as warfarin, apixaban, dabigatran, and rivaroxaban Cisplatin Cyclosporine Diuretics Medications for infection, such as acyclovir, adefovir, amphotericin B, bacitracin, cidofovir, foscarnet, ganciclovir, gentamicin, pentamidine, vancomycin NSAIDs, medications for pain and inflammation, such as ibuprofen or naproxen Other medications that cause heart rhythm changes Pamidronate Zoledronic acid This list may not describe all possible interactions. Give your health care provider a list of all the medicines, herbs, non-prescription drugs, or dietary supplements you use. Also tell them if you smoke, drink alcohol, or use illegal drugs. Some items may interact with your medicine. What should I watch for while using this medication? Your condition will be monitored carefully while you are receiving this medication. You may need blood work while taking this medication. This medication may make you feel generally unwell. This is not uncommon as chemotherapy can affect healthy cells as well as cancer cells. Report any side effects. Continue your course of treatment even though you feel ill unless your care team tells you to stop. This medication may increase your risk of getting an infection. Call your care  team for advice if you get a fever, chills, sore throat, or other symptoms of a cold or flu. Do not treat yourself. Try to avoid being around people who are sick. Avoid taking medications that contain aspirin, acetaminophen, ibuprofen, naproxen, or ketoprofen unless instructed by your care team. These medications may hide a fever. Be careful brushing or flossing your teeth or using a toothpick because you may get an infection or bleed more easily. If you have any dental work done, tell your dentist you are receiving this medication. This medication can make you more sensitive to  cold. Do not drink cold drinks or use ice. Cover exposed skin before coming in contact with cold temperatures or cold objects. When out in cold weather wear warm clothing and cover your mouth and nose to warm the air that goes into your lungs. Tell your care team if you get sensitive to the cold. Talk to your care team if you or your partner are pregnant or think either of you might be pregnant. This medication can cause serious birth defects if taken during pregnancy and for 9 months after the last dose. A negative pregnancy test is required before starting this medication. A reliable form of contraception is recommended while taking this medication and for 9 months after the last dose. Talk to your care team about effective forms of contraception. Do not father a child while taking this medication and for 6 months after the last dose. Use a condom while having sex during this time period. Do not breastfeed while taking this medication and for 3 months after the last dose. This medication may cause infertility. Talk to your care team if you are concerned about your fertility. What side effects may I notice from receiving this medication? Side effects that you should report to your care team as soon as possible: Allergic reactions--skin rash, itching, hives, swelling of the face, lips, tongue, or throat Bleeding--bloody or black, tar-like stools, vomiting blood or brown material that looks like coffee grounds, red or dark brown urine, small red or purple spots on skin, unusual bruising or bleeding Dry cough, shortness of breath or trouble breathing Heart rhythm changes--fast or irregular heartbeat, dizziness, feeling faint or lightheaded, chest pain, trouble breathing Infection--fever, chills, cough, sore throat, wounds that don't heal, pain or trouble when passing urine, general feeling of discomfort or being unwell Liver injury--right upper belly pain, loss of appetite, nausea, light-colored stool, dark  yellow or brown urine, yellowing skin or eyes, unusual weakness or fatigue Low red blood cell level--unusual weakness or fatigue, dizziness, headache, trouble breathing Muscle injury--unusual weakness or fatigue, muscle pain, dark yellow or brown urine, decrease in amount of urine Pain, tingling, or numbness in the hands or feet Sudden and severe headache, confusion, change in vision, seizures, which may be signs of posterior reversible encephalopathy syndrome (PRES) Unusual bruising or bleeding Side effects that usually do not require medical attention (report to your care team if they continue or are bothersome): Diarrhea Nausea Pain, redness, or swelling with sores inside the mouth or throat Unusual weakness or fatigue Vomiting This list may not describe all possible side effects. Call your doctor for medical advice about side effects. You may report side effects to FDA at 1-800-FDA-1088. Where should I keep my medication? This medication is given in a hospital or clinic. It will not be stored at home. NOTE: This sheet is a summary. It may not cover all possible information. If you have questions about this medicine, talk  to your doctor, pharmacist, or health care provider.  2024 Elsevier/Gold Standard (2023-02-17 00:00:00) Irinotecan Injection What is this medication? IRINOTECAN (ir in oh TEE kan) treats some types of cancer. It works by slowing down the growth of cancer cells. This medicine may be used for other purposes; ask your health care provider or pharmacist if you have questions. COMMON BRAND NAME(S): Camptosar What should I tell my care team before I take this medication? They need to know if you have any of these conditions: Dehydration Diarrhea Infection, especially a viral infection, such as chickenpox, cold sores, herpes Liver disease Low blood cell levels (white cells, red cells, and platelets) Low levels of electrolytes, such as calcium, magnesium, or potassium in  your blood Recent or ongoing radiation An unusual or allergic reaction to irinotecan, other medications, foods, dyes, or preservatives If you or your partner are pregnant or trying to get pregnant Breast-feeding How should I use this medication? This medication is injected into a vein. It is given by your care team in a hospital or clinic setting. Talk to your care team about the use of this medication in children. Special care may be needed. Overdosage: If you think you have taken too much of this medicine contact a poison control center or emergency room at once. NOTE: This medicine is only for you. Do not share this medicine with others. What if I miss a dose? Keep appointments for follow-up doses. It is important not to miss your dose. Call your care team if you are unable to keep an appointment. What may interact with this medication? Do not take this medication with any of the following: Cobicistat Itraconazole This medication may also interact with the following: Certain antibiotics, such as clarithromycin, rifampin, rifabutin Certain antivirals for HIV or AIDS Certain medications for fungal infections, such as ketoconazole, posaconazole, voriconazole Certain medications for seizures, such as carbamazepine, phenobarbital, phenytoin Gemfibrozil Nefazodone St. John's wort This list may not describe all possible interactions. Give your health care provider a list of all the medicines, herbs, non-prescription drugs, or dietary supplements you use. Also tell them if you smoke, drink alcohol, or use illegal drugs. Some items may interact with your medicine. What should I watch for while using this medication? Your condition will be monitored carefully while you are receiving this medication. You may need blood work while taking this medication. This medication may make you feel generally unwell. This is not uncommon as chemotherapy can affect healthy cells as well as cancer cells. Report  any side effects. Continue your course of treatment even though you feel ill unless your care team tells you to stop. This medication can cause serious side effects. To reduce the risk, your care team may give you other medications to take before receiving this one. Be sure to follow the directions from your care team. This medication may affect your coordination, reaction time, or judgement. Do not drive or operate machinery until you know how this medication affects you. Sit up or stand slowly to reduce the risk of dizzy or fainting spells. Drinking alcohol with this medication can increase the risk of these side effects. This medication may increase your risk of getting an infection. Call your care team for advice if you get a fever, chills, sore throat, or other symptoms of a cold or flu. Do not treat yourself. Try to avoid being around people who are sick. Avoid taking medications that contain aspirin, acetaminophen, ibuprofen, naproxen, or ketoprofen unless instructed by your care team.  These medications may hide a fever. This medication may increase your risk to bruise or bleed. Call your care team if you notice any unusual bleeding. Be careful brushing or flossing your teeth or using a toothpick because you may get an infection or bleed more easily. If you have any dental work done, tell your dentist you are receiving this medication. Talk to your care team if you or your partner are pregnant or think either of you might be pregnant. This medication can cause serious birth defects if taken during pregnancy and for 6 months after the last dose. You will need a negative pregnancy test before starting this medication. Contraception is recommended while taking this medication and for 6 months after the last dose. Your care team can help you find the option that works for you. Do not father a child while taking this medication and for 3 months after the last dose. Use a condom for contraception during  this time period. Do not breastfeed while taking this medication and for 7 days after the last dose. This medication may cause infertility. Talk to your care team if you are concerned about your fertility. What side effects may I notice from receiving this medication? Side effects that you should report to your care team as soon as possible: Allergic reactions--skin rash, itching, hives, swelling of the face, lips, tongue, or throat Dry cough, shortness of breath or trouble breathing Increased saliva or tears, increased sweating, stomach cramping, diarrhea, small pupils, unusual weakness or fatigue, slow heartbeat Infection--fever, chills, cough, sore throat, wounds that don't heal, pain or trouble when passing urine, general feeling of discomfort or being unwell Kidney injury--decrease in the amount of urine, swelling of the ankles, hands, or feet Low red blood cell level--unusual weakness or fatigue, dizziness, headache, trouble breathing Severe or prolonged diarrhea Unusual bruising or bleeding Side effects that usually do not require medical attention (report to your care team if they continue or are bothersome): Constipation Diarrhea Hair loss Loss of appetite Nausea Stomach pain This list may not describe all possible side effects. Call your doctor for medical advice about side effects. You may report side effects to FDA at 1-800-FDA-1088. Where should I keep my medication? This medication is given in a hospital or clinic. It will not be stored at home. NOTE: This sheet is a summary. It may not cover all possible information. If you have questions about this medicine, talk to your doctor, pharmacist, or health care provider.  2024 Elsevier/Gold Standard (2021-07-19 00:00:00)  Leucovorin Injection What is this medication? LEUCOVORIN (loo koe VOR in) prevents side effects from certain medications, such as methotrexate. It works by increasing folate levels. This helps protect healthy  cells in your body. It may also be used to treat anemia caused by low levels of folate. It can also be used with fluorouracil, a type of chemotherapy, to treat colorectal cancer. It works by increasing the effects of fluorouracil in the body. This medicine may be used for other purposes; ask your health care provider or pharmacist if you have questions. What should I tell my care team before I take this medication? They need to know if you have any of these conditions: Anemia from low levels of vitamin B12 in the blood An unusual or allergic reaction to leucovorin, folic acid, other medications, foods, dyes, or preservatives Pregnant or trying to get pregnant Breastfeeding How should I use this medication? This medication is injected into a vein or a muscle. It is given  by your care team in a hospital or clinic setting. Talk to your care team about the use of this medication in children. Special care may be needed. Overdosage: If you think you have taken too much of this medicine contact a poison control center or emergency room at once. NOTE: This medicine is only for you. Do not share this medicine with others. What if I miss a dose? Keep appointments for follow-up doses. It is important not to miss your dose. Call your care team if you are unable to keep an appointment. What may interact with this medication? Capecitabine Fluorouracil Phenobarbital Phenytoin Primidone Trimethoprim;sulfamethoxazole This list may not describe all possible interactions. Give your health care provider a list of all the medicines, herbs, non-prescription drugs, or dietary supplements you use. Also tell them if you smoke, drink alcohol, or use illegal drugs. Some items may interact with your medicine. What should I watch for while using this medication? Your condition will be monitored carefully while you are receiving this medication. This medication may increase the side effects of 5-fluorouracil. Tell your  care team if you have diarrhea or mouth sores that do not get better or that get worse. What side effects may I notice from receiving this medication? Side effects that you should report to your care team as soon as possible: Allergic reactions--skin rash, itching, hives, swelling of the face, lips, tongue, or throat This list may not describe all possible side effects. Call your doctor for medical advice about side effects. You may report side effects to FDA at 1-800-FDA-1088. Where should I keep my medication? This medication is given in a hospital or clinic. It will not be stored at home. NOTE: This sheet is a summary. It may not cover all possible information. If you have questions about this medicine, talk to your doctor, pharmacist, or health care provider.  2024 Elsevier/Gold Standard (2021-08-10 00:00:00)  Fluorouracil Injection What is this medication? FLUOROURACIL (flure oh YOOR a sil) treats some types of cancer. It works by slowing down the growth of cancer cells. This medicine may be used for other purposes; ask your health care provider or pharmacist if you have questions. COMMON BRAND NAME(S): Adrucil What should I tell my care team before I take this medication? They need to know if you have any of these conditions: Blood disorders Dihydropyrimidine dehydrogenase (DPD) deficiency Infection, such as chickenpox, cold sores, herpes Kidney disease Liver disease Poor nutrition Recent or ongoing radiation therapy An unusual or allergic reaction to fluorouracil, other medications, foods, dyes, or preservatives If you or your partner are pregnant or trying to get pregnant Breast-feeding How should I use this medication? This medication is injected into a vein. It is administered by your care team in a hospital or clinic setting. Talk to your care team about the use of this medication in children. Special care may be needed. Overdosage: If you think you have taken too much of  this medicine contact a poison control center or emergency room at once. NOTE: This medicine is only for you. Do not share this medicine with others. What if I miss a dose? Keep appointments for follow-up doses. It is important not to miss your dose. Call your care team if you are unable to keep an appointment. What may interact with this medication? Do not take this medication with any of the following: Live virus vaccines This medication may also interact with the following: Medications that treat or prevent blood clots, such as warfarin, enoxaparin, dalteparin  This list may not describe all possible interactions. Give your health care provider a list of all the medicines, herbs, non-prescription drugs, or dietary supplements you use. Also tell them if you smoke, drink alcohol, or use illegal drugs. Some items may interact with your medicine. What should I watch for while using this medication? Your condition will be monitored carefully while you are receiving this medication. This medication may make you feel generally unwell. This is not uncommon as chemotherapy can affect healthy cells as well as cancer cells. Report any side effects. Continue your course of treatment even though you feel ill unless your care team tells you to stop. In some cases, you may be given additional medications to help with side effects. Follow all directions for their use. This medication may increase your risk of getting an infection. Call your care team for advice if you get a fever, chills, sore throat, or other symptoms of a cold or flu. Do not treat yourself. Try to avoid being around people who are sick. This medication may increase your risk to bruise or bleed. Call your care team if you notice any unusual bleeding. Be careful brushing or flossing your teeth or using a toothpick because you may get an infection or bleed more easily. If you have any dental work done, tell your dentist you are receiving this  medication. Avoid taking medications that contain aspirin, acetaminophen, ibuprofen, naproxen, or ketoprofen unless instructed by your care team. These medications may hide a fever. Do not treat diarrhea with over the counter products. Contact your care team if you have diarrhea that lasts more than 2 days or if it is severe and watery. This medication can make you more sensitive to the sun. Keep out of the sun. If you cannot avoid being in the sun, wear protective clothing and sunscreen. Do not use sun lamps, tanning beds, or tanning booths. Talk to your care team if you or your partner wish to become pregnant or think you might be pregnant. This medication can cause serious birth defects if taken during pregnancy and for 3 months after the last dose. A reliable form of contraception is recommended while taking this medication and for 3 months after the last dose. Talk to your care team about effective forms of contraception. Do not father a child while taking this medication and for 3 months after the last dose. Use a condom while having sex during this time period. Do not breastfeed while taking this medication. This medication may cause infertility. Talk to your care team if you are concerned about your fertility. What side effects may I notice from receiving this medication? Side effects that you should report to your care team as soon as possible: Allergic reactions--skin rash, itching, hives, swelling of the face, lips, tongue, or throat Heart attack--pain or tightness in the chest, shoulders, arms, or jaw, nausea, shortness of breath, cold or clammy skin, feeling faint or lightheaded Heart failure--shortness of breath, swelling of the ankles, feet, or hands, sudden weight gain, unusual weakness or fatigue Heart rhythm changes--fast or irregular heartbeat, dizziness, feeling faint or lightheaded, chest pain, trouble breathing High ammonia level--unusual weakness or fatigue, confusion, loss of  appetite, nausea, vomiting, seizures Infection--fever, chills, cough, sore throat, wounds that don't heal, pain or trouble when passing urine, general feeling of discomfort or being unwell Low red blood cell level--unusual weakness or fatigue, dizziness, headache, trouble breathing Pain, tingling, or numbness in the hands or feet, muscle weakness, change in vision, confusion  or trouble speaking, loss of balance or coordination, trouble walking, seizures Redness, swelling, and blistering of the skin over hands and feet Severe or prolonged diarrhea Unusual bruising or bleeding Side effects that usually do not require medical attention (report to your care team if they continue or are bothersome): Dry skin Headache Increased tears Nausea Pain, redness, or swelling with sores inside the mouth or throat Sensitivity to light Vomiting This list may not describe all possible side effects. Call your doctor for medical advice about side effects. You may report side effects to FDA at 1-800-FDA-1088. Where should I keep my medication? This medication is given in a hospital or clinic. It will not be stored at home. NOTE: This sheet is a summary. It may not cover all possible information. If you have questions about this medicine, talk to your doctor, pharmacist, or health care provider.  2024 Elsevier/Gold Standard (2021-07-13 00:00:00)

## 2023-05-04 NOTE — Progress Notes (Signed)
Patient seen by Lonna Cobb NP today  Vitals are within treatment parameters:Yes   Labs are within treatment parameters: Yes   Treatment plan has been signed: Yes   Per physician team, Patient is ready for treatment and there are NO modifications to the treatment plan.

## 2023-05-06 ENCOUNTER — Inpatient Hospital Stay: Payer: No Typology Code available for payment source

## 2023-05-06 VITALS — BP 132/87 | HR 72 | Temp 97.6°F | Resp 16

## 2023-05-06 DIAGNOSIS — C259 Malignant neoplasm of pancreas, unspecified: Secondary | ICD-10-CM

## 2023-05-06 DIAGNOSIS — Z5111 Encounter for antineoplastic chemotherapy: Secondary | ICD-10-CM | POA: Diagnosis not present

## 2023-05-06 MED ORDER — SODIUM CHLORIDE 0.9% FLUSH
10.0000 mL | INTRAVENOUS | Status: DC | PRN
Start: 2023-05-06 — End: 2023-05-06
  Administered 2023-05-06: 10 mL

## 2023-05-06 MED ORDER — PEGFILGRASTIM-CBQV 6 MG/0.6ML ~~LOC~~ SOSY
6.0000 mg | PREFILLED_SYRINGE | Freq: Once | SUBCUTANEOUS | Status: AC
  Administered 2023-05-06: 6 mg via SUBCUTANEOUS
  Filled 2023-05-06: qty 0.6

## 2023-05-06 MED ORDER — HEPARIN SOD (PORK) LOCK FLUSH 100 UNIT/ML IV SOLN
500.0000 [IU] | Freq: Once | INTRAVENOUS | Status: AC | PRN
Start: 2023-05-06 — End: 2023-05-06
  Administered 2023-05-06: 500 [IU]

## 2023-05-06 NOTE — Patient Instructions (Signed)

## 2023-05-08 ENCOUNTER — Other Ambulatory Visit: Payer: 59

## 2023-05-08 ENCOUNTER — Ambulatory Visit: Payer: 59

## 2023-05-08 ENCOUNTER — Telehealth: Payer: Self-pay | Admitting: *Deleted

## 2023-05-08 ENCOUNTER — Encounter: Payer: Self-pay | Admitting: Oncology

## 2023-05-08 ENCOUNTER — Ambulatory Visit: Payer: 59 | Admitting: Nurse Practitioner

## 2023-05-08 NOTE — Telephone Encounter (Signed)
Called patient to f/u on her [employer] call on 05/08/23 early am. She reports she was having pain rated 5/10 at right lateral torso with deep breath. When she pressed under her rib cage it lessened. Denies any dyspnea, sweats, fever, or constipation or excess gas. Today it is very mild, maybe 2/10. This did not occur after her 1st chemotherapy treatment.  Will notify MD and call her back if he has any other suggestions or thoughts. Continue to monitor and report any other symptoms

## 2023-05-09 ENCOUNTER — Telehealth: Payer: Self-pay

## 2023-05-09 ENCOUNTER — Inpatient Hospital Stay: Payer: 59

## 2023-05-09 NOTE — Telephone Encounter (Signed)
Spoke with patient regarding her recent c/o of pain. Pt states that her pain is gone, denies dyspnea, tachycardia. Pt will call [facility] with any further concerns/questions.

## 2023-05-17 ENCOUNTER — Other Ambulatory Visit: Payer: Self-pay | Admitting: Internal Medicine

## 2023-05-17 MED ORDER — INSULIN PEN NEEDLE 32G X 4 MM MISC: 1.0000 | Freq: Four times a day (QID) | 0 refills | Status: DC

## 2023-05-17 NOTE — Telephone Encounter (Signed)
 Copied from CRM [id number]. Topic: Clinical - Medication Refill >> May 17, 2023  1:57 PM Gurney Maxin H wrote: Most Recent Primary Care Visit:  Provider: Lula Olszewski  Department: [facility]  Visit Type: OFFICE VISIT  Date: 03/30/2023  Medication: Insulin Pen Needle 32G X 4 MM MISC  Has the patient contacted their pharmacy? Yes, pharmacy won't fill since original prescription was sent in by hospital (Agent: If no, request that the patient contact the pharmacy for the refill. If patient does not wish to contact the pharmacy document the reason why and proceed with request.) (Agent: If yes, when and what did the pharmacy advise?)  Is this the correct pharmacy for this prescription? Yes If no, delete pharmacy and type the correct one.  This is the patient's preferred pharmacy:  [id number] [place], [place] - [address] [place] [address] Phone: [phone] Fax: [phone]    Has the prescription been filled recently? No  Is the patient out of the medication? Yes  Has the patient been seen for an appointment in the last year OR does the patient have an upcoming appointment? Yes  Can we respond through MyChart? Yes  Agent: Please be advised that Rx refills may take up to 3 business days. We ask that you follow-up with your pharmacy.

## 2023-05-19 ENCOUNTER — Other Ambulatory Visit: Payer: Self-pay | Admitting: Oncology

## 2023-05-21 ENCOUNTER — Other Ambulatory Visit: Payer: Self-pay | Admitting: Oncology

## 2023-05-21 DIAGNOSIS — C259 Malignant neoplasm of pancreas, unspecified: Secondary | ICD-10-CM

## 2023-05-22 ENCOUNTER — Inpatient Hospital Stay: Payer: No Typology Code available for payment source | Attending: Nurse Practitioner

## 2023-05-22 ENCOUNTER — Inpatient Hospital Stay: Payer: No Typology Code available for payment source

## 2023-05-22 ENCOUNTER — Inpatient Hospital Stay (HOSPITAL_BASED_OUTPATIENT_CLINIC_OR_DEPARTMENT_OTHER): Payer: No Typology Code available for payment source | Admitting: Oncology

## 2023-05-22 VITALS — BP 152/95 | HR 77

## 2023-05-22 VITALS — BP 117/83 | HR 80 | Temp 98.2°F | Resp 18 | Ht 63.0 in | Wt 227.3 lb

## 2023-05-22 DIAGNOSIS — Z79630 Long term (current) use of alkylating agent: Secondary | ICD-10-CM | POA: Diagnosis not present

## 2023-05-22 DIAGNOSIS — C259 Malignant neoplasm of pancreas, unspecified: Secondary | ICD-10-CM

## 2023-05-22 DIAGNOSIS — Z5189 Encounter for other specified aftercare: Secondary | ICD-10-CM | POA: Diagnosis not present

## 2023-05-22 DIAGNOSIS — C252 Malignant neoplasm of tail of pancreas: Secondary | ICD-10-CM | POA: Insufficient documentation

## 2023-05-22 DIAGNOSIS — Z79631 Long term (current) use of antimetabolite agent: Secondary | ICD-10-CM | POA: Insufficient documentation

## 2023-05-22 DIAGNOSIS — Z5111 Encounter for antineoplastic chemotherapy: Secondary | ICD-10-CM | POA: Diagnosis present

## 2023-05-22 DIAGNOSIS — Z79634 Long term (current) use of topoisomerase inhibitor: Secondary | ICD-10-CM | POA: Insufficient documentation

## 2023-05-22 DIAGNOSIS — C787 Secondary malignant neoplasm of liver and intrahepatic bile duct: Secondary | ICD-10-CM | POA: Insufficient documentation

## 2023-05-22 LAB — CBC WITH DIFFERENTIAL (CANCER CENTER ONLY)
Abs Immature Granulocytes: 0.23 10*3/uL — ABNORMAL HIGH (ref 0.00–0.07)
Basophils Absolute: 0.1 10*3/uL (ref 0.0–0.1)
Basophils Relative: 1 %
Eosinophils Absolute: 0.3 10*3/uL (ref 0.0–0.5)
Eosinophils Relative: 3 %
HCT: 31.6 % — ABNORMAL LOW (ref 36.0–46.0)
Hemoglobin: 10.8 g/dL — ABNORMAL LOW (ref 12.0–15.0)
Immature Granulocytes: 2 %
Lymphocytes Relative: 27 %
Lymphs Abs: 2.7 10*3/uL (ref 0.7–4.0)
MCH: 32.1 pg (ref 26.0–34.0)
MCHC: 34.2 g/dL (ref 30.0–36.0)
MCV: 94 fL (ref 80.0–100.0)
Monocytes Absolute: 1.1 10*3/uL — ABNORMAL HIGH (ref 0.1–1.0)
Monocytes Relative: 11 %
Neutro Abs: 5.5 10*3/uL (ref 1.7–7.7)
Neutrophils Relative %: 56 %
Platelet Count: 200 10*3/uL (ref 150–400)
RBC: 3.36 MIL/uL — ABNORMAL LOW (ref 3.87–5.11)
RDW: 16.5 % — ABNORMAL HIGH (ref 11.5–15.5)
WBC Count: 9.9 10*3/uL (ref 4.0–10.5)
nRBC: 3.1 % — ABNORMAL HIGH (ref 0.0–0.2)

## 2023-05-22 LAB — CMP (CANCER CENTER ONLY)
ALT: 33 U/L (ref 0–44)
AST: 39 U/L (ref 15–41)
Albumin: 4.8 g/dL (ref 3.5–5.0)
Alkaline Phosphatase: 141 U/L — ABNORMAL HIGH (ref 38–126)
Anion gap: 11 (ref 5–15)
BUN: 15 mg/dL (ref 6–20)
CO2: 27 mmol/L (ref 22–32)
Calcium: 9.4 mg/dL (ref 8.9–10.3)
Chloride: 103 mmol/L (ref 98–111)
Creatinine: 0.69 mg/dL (ref 0.44–1.00)
GFR, Estimated: >60 mL/min (ref >60)
Glucose, Bld: 134 mg/dL — ABNORMAL HIGH (ref 70–99)
Potassium: 3.8 mmol/L (ref 3.5–5.1)
Sodium: 141 mmol/L (ref 135–145)
Total Bilirubin: 0.5 mg/dL (ref 0.0–1.2)
Total Protein: 7.3 g/dL (ref 6.5–8.1)

## 2023-05-22 LAB — HCG, SERUM, QUALITATIVE: Preg, Serum: NEGATIVE

## 2023-05-22 MED ORDER — DEXTROSE 5 % IV SOLN: INTRAVENOUS | Status: DC

## 2023-05-22 MED ORDER — OXALIPLATIN CHEMO INJECTION 100 MG/20ML
83.0000 mg/m2 | Freq: Once | INTRAVENOUS | Status: AC
  Administered 2023-05-22: 180 mg via INTRAVENOUS
  Filled 2023-05-22: qty 36

## 2023-05-22 MED ORDER — SODIUM CHLORIDE 0.9 % IV SOLN
2400.0000 mg/m2 | INTRAVENOUS | Status: DC
  Administered 2023-05-22: 5000 mg via INTRAVENOUS
  Filled 2023-05-22: qty 100

## 2023-05-22 MED ORDER — SODIUM CHLORIDE 0.9 % IV SOLN
150.0000 mg | Freq: Once | INTRAVENOUS | Status: AC
  Administered 2023-05-22: 150 mg via INTRAVENOUS
  Filled 2023-05-22: qty 150

## 2023-05-22 MED ORDER — PALONOSETRON HCL INJECTION 0.25 MG/5ML
0.2500 mg | Freq: Once | INTRAVENOUS | Status: AC
  Administered 2023-05-22: 0.25 mg via INTRAVENOUS
  Filled 2023-05-22: qty 5

## 2023-05-22 MED ORDER — SODIUM CHLORIDE 0.9 % IV SOLN
400.0000 mg/m2 | Freq: Once | INTRAVENOUS | Status: AC
  Administered 2023-05-22: 856 mg via INTRAVENOUS
  Filled 2023-05-22: qty 42.8

## 2023-05-22 MED ORDER — DEXAMETHASONE SODIUM PHOSPHATE 10 MG/ML IJ SOLN
10.0000 mg | Freq: Once | INTRAMUSCULAR | Status: AC
  Administered 2023-05-22: 10 mg via INTRAVENOUS
  Filled 2023-05-22: qty 1

## 2023-05-22 MED ORDER — SODIUM CHLORIDE 0.9 % IV SOLN
150.0000 mg/m2 | Freq: Once | INTRAVENOUS | Status: AC
  Administered 2023-05-22: 320 mg via INTRAVENOUS
  Filled 2023-05-22: qty 15

## 2023-05-22 MED ORDER — ATROPINE SULFATE 1 MG/ML IV SOLN
0.5000 mg | Freq: Once | INTRAVENOUS | Status: AC | PRN
  Administered 2023-05-22: 0.5 mg via INTRAVENOUS
  Filled 2023-05-22: qty 1

## 2023-05-22 NOTE — Progress Notes (Signed)
 Patient seen by Dr. Thornton Papas today  Vitals are within treatment parameters:Yes   Labs are within treatment parameters: Yes   Treatment plan has been signed: Yes   Per physician team, Patient is ready for treatment and there are NO modifications to the treatment plan.

## 2023-05-22 NOTE — Progress Notes (Signed)
  [facility] OFFICE PROGRESS NOTE   Diagnosis: Pancreas cancer  INTERVAL HISTORY:   Debbie [name] completed another cycle FOLFIRINOX on 05/04/2023.  She received G-CSF support.  No bone pain following G-CSF.  She had nausea following day 1 chemotherapy and took Compazine.  She had 1 episode of diarrhea relieved with Imodium.  In general she feels improved compared to prechemotherapy.  Cold sensitivity lasted 3 to 4 days.  No neuropathy symptoms at present.  Objective:  Vital signs in last 24 hours:  Blood pressure 117/83, pulse 80, temperature 98.2 F (36.8 C), temperature source Temporal, resp. rate 18, height 5\' 3"  (1.6 m), weight 227 lb 4.8 oz (103.1 kg), SpO2 98%.    HEENT: No thrush or ulcers Resp: Lungs clear bilaterally Cardio: Regular rate and rhythm GI: No hepatosplenomegaly, nontender, no mass Vascular: No leg edema  Skin: Palms without erythema  Portacath/PICC-without erythema  Lab Results:  Lab Results  Component Value Date   WBC 9.9 05/22/2023   HGB 10.8 (L) 05/22/2023   HCT 31.6 (L) 05/22/2023   MCV 94.0 05/22/2023   PLT 200 05/22/2023   NEUTROABS 5.5 05/22/2023    CMP  Lab Results  Component Value Date   NA 141 05/22/2023   K 3.8 05/22/2023   CL 103 05/22/2023   CO2 27 05/22/2023   GLUCOSE 134 (H) 05/22/2023   BUN 15 05/22/2023   CREATININE 0.69 05/22/2023   CALCIUM 9.4 05/22/2023   PROT 7.3 05/22/2023   ALBUMIN 4.8 05/22/2023   AST 39 05/22/2023   ALT 33 05/22/2023   ALKPHOS 141 (H) 05/22/2023   BILITOT 0.5 05/22/2023   GFRNONAA >60 05/22/2023   GFRAA >60 10/03/2015    Lab Results  Component Value Date   ZOX096 180,208 (H) 04/05/2023     Medications: I have reviewed the patient's current medications.   Assessment/Plan: Pancreas cancer CT abdomen/pelvis 03/24/2023: 3 x 4.6 cm pancreas tail mass, multiple liver lesions consistent with metastases, fat stranding surrounding the second and third ports of the duodenum and  pancreas head/uncinate  CT chest 03/27/2023: New spiculated nodular density in the right lower lobe, 8 x 4 mm EUS 03/27/2023: Pancreas tail mass, liver lesions, T2 N0 M1, needle biopsy of pancreas mass, adenocarcinoma Foundation 1: K-ras G 12R, HRD signature negative, MSS, tumor mutation burden 0, PD-L1 TPS 1% Elevated CA 19-9 (121,720 on 03/26/2023) Cycle 1 FOLFIRINOX 04/10/2023 Chemotherapy held 04/24/2023 due to neutropenia Cycle 2 FOLFIRINOX 05/04/2023, Udenyca Cycle 3 FOLFIRINOX 05/22/2023, Udenyca   2.  Diabetes 3.  Acute abdominal pain and nausea/vomiting 03/23/2023 secondary to pancreatitis.  Improved 03/31/2023 4.  Hypertension. 5.  Asthma 6.  Hypothyroidism 7.  Hyperlipidemia 8.  Acute pancreatitis 03/24/2023    Disposition: Debbie [name] appears stable.  She tolerated cycle 2 FOLFIRINOX well.  She will complete cycle 3 today.  She will again receive G-CSF support.  She will return for an office visit and chemotherapy in 2 weeks.  We will follow-up on the CA 19-9 from today.  Thornton Papas, MD  05/22/2023  9:10 AM

## 2023-05-22 NOTE — Patient Instructions (Addendum)
 [facility] A [facility]PT OF [facility]   Discharge Instructions: The chemotherapy medication bag should finish at 46 hours, 96 hours, or 7 days. For example, if your pump is scheduled for 46 hours and it was put on at 4:00 p.m., it should finish at 2:00 p.m. the day it is scheduled to come off regardless of your appointment time.     Estimated time to finish at 1:00 [facility], May 24, 2023.   If the display on your pump reads "Low Volume" and it is beeping, take the batteries out of the pump and come to the cancer center for it to be taken off.   If the pump alarms go off prior to the pump reading "Low Volume" then call [phone] and someone can assist you.  If the plunger comes out and the chemotherapy medication is leaking out, please use your home chemo spill kit to clean up the spill. Do NOT use paper towels or other household products.  If you have problems or questions regarding your pump, please call either [phone] (24 hours a day) or the cancer center Monday-Friday 8:00 a.m.- 4:30 p.m. at the clinic number and we will assist you. If you are unable to get assistance, then go to the nearest Emergency Department and ask the staff to contact the IV team for assistance.   Thank you for choosing [facility] to provide your oncology and hematology care.   If you have a lab appointment with the Cancer Center, please go directly to the Cancer Center and check in at the registration area.   Wear comfortable clothing and clothing appropriate for easy access to any Portacath or PICC line.   We strive to give you quality time with your provider. You may need to reschedule your appointment if you arrive late (15 or more minutes).  Arriving late affects you and other patients whose appointments are after yours.  Also, if you miss three or more appointments without notifying the office, you may be dismissed from the clinic at the provider's  discretion.      For prescription refill requests, have your pharmacy contact our office and allow 72 hours for refills to be completed.    Today you received the following chemotherapy and/or immunotherapy agents OXALIPLATIN/LEUCOVORIN/IRINOTECAN/FLUOROURACIL.      To help prevent nausea and vomiting after your treatment, we encourage you to take your nausea medication as directed.  BELOW ARE SYMPTOMS THAT SHOULD BE REPORTED IMMEDIATELY: *FEVER GREATER THAN 100.4 F (38 C) OR HIGHER *CHILLS OR SWEATING *NAUSEA AND VOMITING THAT IS NOT CONTROLLED WITH YOUR NAUSEA MEDICATION *UNUSUAL SHORTNESS OF BREATH *UNUSUAL BRUISING OR BLEEDING *URINARY PROBLEMS (pain or burning when urinating, or frequent urination) *BOWEL PROBLEMS (unusual diarrhea, constipation, pain near the anus) TENDERNESS IN MOUTH AND THROAT WITH OR WITHOUT PRESENCE OF ULCERS (sore throat, sores in mouth, or a toothache) UNUSUAL RASH, SWELLING OR PAIN  UNUSUAL VAGINAL DISCHARGE OR ITCHING   Items with * indicate a potential emergency and should be followed up as soon as possible or go to the Emergency Department if any problems should occur.  Please show the CHEMOTHERAPY ALERT CARD or IMMUNOTHERAPY ALERT CARD at check-in to the Emergency Department and triage nurse.  Should you have questions after your visit or need to cancel or reschedule your appointment, please contact [facility]  Dept: [phone]  and follow the prompts.  Office hours  are 8:00 a.m. to 4:30 p.m. Monday - Friday. Please note that voicemails left after 4:00 p.m. may not be returned until the following business day.  We are closed weekends and major holidays. You have access to a nurse at all times for urgent questions. Please call the main number to the clinic Dept: [phone] and follow the prompts.   For any non-urgent questions, you may also contact your provider using MyChart. We now offer  e-Visits for anyone 6 and older to request care online for non-urgent symptoms. For details visit [email or web address].   Also download the MyChart app! Go to the app store, search "MyChart", open the app, select [facility], and log in with your MyChart username and password.  Oxaliplatin Injection What is this medication? OXALIPLATIN (ox AL i PLA tin) treats colorectal cancer. It works by slowing down the growth of cancer cells. This medicine may be used for other purposes; ask your health care provider or pharmacist if you have questions. COMMON BRAND NAME(S): Eloxatin What should I tell my care team before I take this medication? They need to know if you have any of these conditions: Heart disease History of irregular heartbeat or rhythm Liver disease Low blood cell levels (white cells, red cells, and platelets) Lung or breathing disease, such as asthma Take medications that treat or prevent blood clots Tingling of the fingers, toes, or other nerve disorder An unusual or allergic reaction to oxaliplatin, other medications, foods, dyes, or preservatives If you or your partner are pregnant or trying to get pregnant Breast-feeding How should I use this medication? This medication is injected into a vein. It is given by your care team in a hospital or clinic setting. Talk to your care team about the use of this medication in children. Special care may be needed. Overdosage: If you think you have taken too much of this medicine contact a poison control center or emergency room at once. NOTE: This medicine is only for you. Do not share this medicine with others. What if I miss a dose? Keep appointments for follow-up doses. It is important not to miss a dose. Call your care team if you are unable to keep an appointment. What may interact with this medication? Do not take this medication with any of the following: Cisapride Dronedarone Pimozide Thioridazine This medication may also  interact with the following: Aspirin and aspirin-like medications Certain medications that treat or prevent blood clots, such as warfarin, apixaban, dabigatran, and rivaroxaban Cisplatin Cyclosporine Diuretics Medications for infection, such as acyclovir, adefovir, amphotericin B, bacitracin, cidofovir, foscarnet, ganciclovir, gentamicin, pentamidine, vancomycin NSAIDs, medications for pain and inflammation, such as ibuprofen or naproxen Other medications that cause heart rhythm changes Pamidronate Zoledronic acid This list may not describe all possible interactions. Give your health care provider a list of all the medicines, herbs, non-prescription drugs, or dietary supplements you use. Also tell them if you smoke, drink alcohol, or use illegal drugs. Some items may interact with your medicine. What should I watch for while using this medication? Your condition will be monitored carefully while you are receiving this medication. You may need blood work while taking this medication. This medication may make you feel generally unwell. This is not uncommon as chemotherapy can affect healthy cells as well as cancer cells. Report any side effects. Continue your course of treatment even though you feel ill unless your care team tells you to stop. This medication may increase your risk of getting an infection. Call your care team  for advice if you get a fever, chills, sore throat, or other symptoms of a cold or flu. Do not treat yourself. Try to avoid being around people who are sick. Avoid taking medications that contain aspirin, acetaminophen, ibuprofen, naproxen, or ketoprofen unless instructed by your care team. These medications may hide a fever. Be careful brushing or flossing your teeth or using a toothpick because you may get an infection or bleed more easily. If you have any dental work done, tell your dentist you are receiving this medication. This medication can make you more sensitive to  cold. Do not drink cold drinks or use ice. Cover exposed skin before coming in contact with cold temperatures or cold objects. When out in cold weather wear warm clothing and cover your mouth and nose to warm the air that goes into your lungs. Tell your care team if you get sensitive to the cold. Talk to your care team if you or your partner are pregnant or think either of you might be pregnant. This medication can cause serious birth defects if taken during pregnancy and for 9 months after the last dose. A negative pregnancy test is required before starting this medication. A reliable form of contraception is recommended while taking this medication and for 9 months after the last dose. Talk to your care team about effective forms of contraception. Do not father a child while taking this medication and for 6 months after the last dose. Use a condom while having sex during this time period. Do not breastfeed while taking this medication and for 3 months after the last dose. This medication may cause infertility. Talk to your care team if you are concerned about your fertility. What side effects may I notice from receiving this medication? Side effects that you should report to your care team as soon as possible: Allergic reactions--skin rash, itching, hives, swelling of the face, lips, tongue, or throat Bleeding--bloody or black, tar-like stools, vomiting blood or brown material that looks like coffee grounds, red or dark brown urine, small red or purple spots on skin, unusual bruising or bleeding Dry cough, shortness of breath or trouble breathing Heart rhythm changes--fast or irregular heartbeat, dizziness, feeling faint or lightheaded, chest pain, trouble breathing Infection--fever, chills, cough, sore throat, wounds that don't heal, pain or trouble when passing urine, general feeling of discomfort or being unwell Liver injury--right upper belly pain, loss of appetite, nausea, light-colored stool, dark  yellow or brown urine, yellowing skin or eyes, unusual weakness or fatigue Low red blood cell level--unusual weakness or fatigue, dizziness, headache, trouble breathing Muscle injury--unusual weakness or fatigue, muscle pain, dark yellow or brown urine, decrease in amount of urine Pain, tingling, or numbness in the hands or feet Sudden and severe headache, confusion, change in vision, seizures, which may be signs of posterior reversible encephalopathy syndrome (PRES) Unusual bruising or bleeding Side effects that usually do not require medical attention (report to your care team if they continue or are bothersome): Diarrhea Nausea Pain, redness, or swelling with sores inside the mouth or throat Unusual weakness or fatigue Vomiting This list may not describe all possible side effects. Call your doctor for medical advice about side effects. You may report side effects to FDA at 1-800-FDA-1088. Where should I keep my medication? This medication is given in a hospital or clinic. It will not be stored at home. NOTE: This sheet is a summary. It may not cover all possible information. If you have questions about this medicine, talk to  your doctor, pharmacist, or health care provider.  2024 Elsevier/Gold Standard (2023-02-17 00:00:00) Leucovorin Injection What is this medication? LEUCOVORIN (loo koe VOR in) prevents side effects from certain medications, such as methotrexate. It works by increasing folate levels. This helps protect healthy cells in your body. It may also be used to treat anemia caused by low levels of folate. It can also be used with fluorouracil, a type of chemotherapy, to treat colorectal cancer. It works by increasing the effects of fluorouracil in the body. This medicine may be used for other purposes; ask your health care provider or pharmacist if you have questions. What should I tell my care team before I take this medication? They need to know if you have any of these  conditions: Anemia from low levels of vitamin B12 in the blood An unusual or allergic reaction to leucovorin, folic acid, other medications, foods, dyes, or preservatives Pregnant or trying to get pregnant Breastfeeding How should I use this medication? This medication is injected into a vein or a muscle. It is given by your care team in a hospital or clinic setting. Talk to your care team about the use of this medication in children. Special care may be needed. Overdosage: If you think you have taken too much of this medicine contact a poison control center or emergency room at once. NOTE: This medicine is only for you. Do not share this medicine with others. What if I miss a dose? Keep appointments for follow-up doses. It is important not to miss your dose. Call your care team if you are unable to keep an appointment. What may interact with this medication? Capecitabine Fluorouracil Phenobarbital Phenytoin Primidone Trimethoprim;sulfamethoxazole This list may not describe all possible interactions. Give your health care provider a list of all the medicines, herbs, non-prescription drugs, or dietary supplements you use. Also tell them if you smoke, drink alcohol, or use illegal drugs. Some items may interact with your medicine. What should I watch for while using this medication? Your condition will be monitored carefully while you are receiving this medication. This medication may increase the side effects of 5-fluorouracil. Tell your care team if you have diarrhea or mouth sores that do not get better or that get worse. What side effects may I notice from receiving this medication? Side effects that you should report to your care team as soon as possible: Allergic reactions--skin rash, itching, hives, swelling of the face, lips, tongue, or throat This list may not describe all possible side effects. Call your doctor for medical advice about side effects. You may report side effects to  FDA at 1-800-FDA-1088. Where should I keep my medication? This medication is given in a hospital or clinic. It will not be stored at home. NOTE: This sheet is a summary. It may not cover all possible information. If you have questions about this medicine, talk to your doctor, pharmacist, or health care provider.  2024 Elsevier/Gold Standard (2021-08-10 00:00:00)  Irinotecan Injection What is this medication? IRINOTECAN (ir in oh TEE kan) treats some types of cancer. It works by slowing down the growth of cancer cells. This medicine may be used for other purposes; ask your health care provider or pharmacist if you have questions. COMMON BRAND NAME(S): Camptosar What should I tell my care team before I take this medication? They need to know if you have any of these conditions: Dehydration Diarrhea Infection, especially a viral infection, such as chickenpox, cold sores, herpes Liver disease Low blood cell levels (white  cells, red cells, and platelets) Low levels of electrolytes, such as calcium, magnesium, or potassium in your blood Recent or ongoing radiation An unusual or allergic reaction to irinotecan, other medications, foods, dyes, or preservatives If you or your partner are pregnant or trying to get pregnant Breast-feeding How should I use this medication? This medication is injected into a vein. It is given by your care team in a hospital or clinic setting. Talk to your care team about the use of this medication in children. Special care may be needed. Overdosage: If you think you have taken too much of this medicine contact a poison control center or emergency room at once. NOTE: This medicine is only for you. Do not share this medicine with others. What if I miss a dose? Keep appointments for follow-up doses. It is important not to miss your dose. Call your care team if you are unable to keep an appointment. What may interact with this medication? Do not take this medication  with any of the following: Cobicistat Itraconazole This medication may also interact with the following: Certain antibiotics, such as clarithromycin, rifampin, rifabutin Certain antivirals for HIV or AIDS Certain medications for fungal infections, such as ketoconazole, posaconazole, voriconazole Certain medications for seizures, such as carbamazepine, phenobarbital, phenytoin Gemfibrozil Nefazodone St. John's wort This list may not describe all possible interactions. Give your health care provider a list of all the medicines, herbs, non-prescription drugs, or dietary supplements you use. Also tell them if you smoke, drink alcohol, or use illegal drugs. Some items may interact with your medicine. What should I watch for while using this medication? Your condition will be monitored carefully while you are receiving this medication. You may need blood work while taking this medication. This medication may make you feel generally unwell. This is not uncommon as chemotherapy can affect healthy cells as well as cancer cells. Report any side effects. Continue your course of treatment even though you feel ill unless your care team tells you to stop. This medication can cause serious side effects. To reduce the risk, your care team may give you other medications to take before receiving this one. Be sure to follow the directions from your care team. This medication may affect your coordination, reaction time, or judgement. Do not drive or operate machinery until you know how this medication affects you. Sit up or stand slowly to reduce the risk of dizzy or fainting spells. Drinking alcohol with this medication can increase the risk of these side effects. This medication may increase your risk of getting an infection. Call your care team for advice if you get a fever, chills, sore throat, or other symptoms of a cold or flu. Do not treat yourself. Try to avoid being around people who are sick. Avoid taking  medications that contain aspirin, acetaminophen, ibuprofen, naproxen, or ketoprofen unless instructed by your care team. These medications may hide a fever. This medication may increase your risk to bruise or bleed. Call your care team if you notice any unusual bleeding. Be careful brushing or flossing your teeth or using a toothpick because you may get an infection or bleed more easily. If you have any dental work done, tell your dentist you are receiving this medication. Talk to your care team if you or your partner are pregnant or think either of you might be pregnant. This medication can cause serious birth defects if taken during pregnancy and for 6 months after the last dose. You will need a negative pregnancy  test before starting this medication. Contraception is recommended while taking this medication and for 6 months after the last dose. Your care team can help you find the option that works for you. Do not father a child while taking this medication and for 3 months after the last dose. Use a condom for contraception during this time period. Do not breastfeed while taking this medication and for 7 days after the last dose. This medication may cause infertility. Talk to your care team if you are concerned about your fertility. What side effects may I notice from receiving this medication? Side effects that you should report to your care team as soon as possible: Allergic reactions--skin rash, itching, hives, swelling of the face, lips, tongue, or throat Dry cough, shortness of breath or trouble breathing Increased saliva or tears, increased sweating, stomach cramping, diarrhea, small pupils, unusual weakness or fatigue, slow heartbeat Infection--fever, chills, cough, sore throat, wounds that don't heal, pain or trouble when passing urine, general feeling of discomfort or being unwell Kidney injury--decrease in the amount of urine, swelling of the ankles, hands, or feet Low red blood cell  level--unusual weakness or fatigue, dizziness, headache, trouble breathing Severe or prolonged diarrhea Unusual bruising or bleeding Side effects that usually do not require medical attention (report to your care team if they continue or are bothersome): Constipation Diarrhea Hair loss Loss of appetite Nausea Stomach pain This list may not describe all possible side effects. Call your doctor for medical advice about side effects. You may report side effects to FDA at 1-800-FDA-1088. Where should I keep my medication? This medication is given in a hospital or clinic. It will not be stored at home. NOTE: This sheet is a summary. It may not cover all possible information. If you have questions about this medicine, talk to your doctor, pharmacist, or health care provider.  2024 Elsevier/Gold Standard (2021-07-19 00:00:00)  Fluorouracil Injection What is this medication? FLUOROURACIL (flure oh YOOR a sil) treats some types of cancer. It works by slowing down the growth of cancer cells. This medicine may be used for other purposes; ask your health care provider or pharmacist if you have questions. COMMON BRAND NAME(S): Adrucil What should I tell my care team before I take this medication? They need to know if you have any of these conditions: Blood disorders Dihydropyrimidine dehydrogenase (DPD) deficiency Infection, such as chickenpox, cold sores, herpes Kidney disease Liver disease Poor nutrition Recent or ongoing radiation therapy An unusual or allergic reaction to fluorouracil, other medications, foods, dyes, or preservatives If you or your partner are pregnant or trying to get pregnant Breast-feeding How should I use this medication? This medication is injected into a vein. It is administered by your care team in a hospital or clinic setting. Talk to your care team about the use of this medication in children. Special care may be needed. Overdosage: If you think you have taken too  much of this medicine contact a poison control center or emergency room at once. NOTE: This medicine is only for you. Do not share this medicine with others. What if I miss a dose? Keep appointments for follow-up doses. It is important not to miss your dose. Call your care team if you are unable to keep an appointment. What may interact with this medication? Do not take this medication with any of the following: Live virus vaccines This medication may also interact with the following: Medications that treat or prevent blood clots, such as warfarin, enoxaparin, dalteparin This  list may not describe all possible interactions. Give your health care provider a list of all the medicines, herbs, non-prescription drugs, or dietary supplements you use. Also tell them if you smoke, drink alcohol, or use illegal drugs. Some items may interact with your medicine. What should I watch for while using this medication? Your condition will be monitored carefully while you are receiving this medication. This medication may make you feel generally unwell. This is not uncommon as chemotherapy can affect healthy cells as well as cancer cells. Report any side effects. Continue your course of treatment even though you feel ill unless your care team tells you to stop. In some cases, you may be given additional medications to help with side effects. Follow all directions for their use. This medication may increase your risk of getting an infection. Call your care team for advice if you get a fever, chills, sore throat, or other symptoms of a cold or flu. Do not treat yourself. Try to avoid being around people who are sick. This medication may increase your risk to bruise or bleed. Call your care team if you notice any unusual bleeding. Be careful brushing or flossing your teeth or using a toothpick because you may get an infection or bleed more easily. If you have any dental work done, tell your dentist you are receiving  this medication. Avoid taking medications that contain aspirin, acetaminophen, ibuprofen, naproxen, or ketoprofen unless instructed by your care team. These medications may hide a fever. Do not treat diarrhea with over the counter products. Contact your care team if you have diarrhea that lasts more than 2 days or if it is severe and watery. This medication can make you more sensitive to the sun. Keep out of the sun. If you cannot avoid being in the sun, wear protective clothing and sunscreen. Do not use sun lamps, tanning beds, or tanning booths. Talk to your care team if you or your partner wish to become pregnant or think you might be pregnant. This medication can cause serious birth defects if taken during pregnancy and for 3 months after the last dose. A reliable form of contraception is recommended while taking this medication and for 3 months after the last dose. Talk to your care team about effective forms of contraception. Do not father a child while taking this medication and for 3 months after the last dose. Use a condom while having sex during this time period. Do not breastfeed while taking this medication. This medication may cause infertility. Talk to your care team if you are concerned about your fertility. What side effects may I notice from receiving this medication? Side effects that you should report to your care team as soon as possible: Allergic reactions--skin rash, itching, hives, swelling of the face, lips, tongue, or throat Heart attack--pain or tightness in the chest, shoulders, arms, or jaw, nausea, shortness of breath, cold or clammy skin, feeling faint or lightheaded Heart failure--shortness of breath, swelling of the ankles, feet, or hands, sudden weight gain, unusual weakness or fatigue Heart rhythm changes--fast or irregular heartbeat, dizziness, feeling faint or lightheaded, chest pain, trouble breathing High ammonia level--unusual weakness or fatigue, confusion, loss  of appetite, nausea, vomiting, seizures Infection--fever, chills, cough, sore throat, wounds that don't heal, pain or trouble when passing urine, general feeling of discomfort or being unwell Low red blood cell level--unusual weakness or fatigue, dizziness, headache, trouble breathing Pain, tingling, or numbness in the hands or feet, muscle weakness, change in vision, confusion or  trouble speaking, loss of balance or coordination, trouble walking, seizures Redness, swelling, and blistering of the skin over hands and feet Severe or prolonged diarrhea Unusual bruising or bleeding Side effects that usually do not require medical attention (report to your care team if they continue or are bothersome): Dry skin Headache Increased tears Nausea Pain, redness, or swelling with sores inside the mouth or throat Sensitivity to light Vomiting This list may not describe all possible side effects. Call your doctor for medical advice about side effects. You may report side effects to FDA at 1-800-FDA-1088. Where should I keep my medication? This medication is given in a hospital or clinic. It will not be stored at home. NOTE: This sheet is a summary. It may not cover all possible information. If you have questions about this medicine, talk to your doctor, pharmacist, or health care provider.  2024 [employer] Standard (2021-07-13 00:00:00)

## 2023-05-23 ENCOUNTER — Encounter: Payer: Self-pay | Admitting: Oncology

## 2023-05-24 ENCOUNTER — Inpatient Hospital Stay: Payer: 59

## 2023-05-24 VITALS — BP 124/91 | HR 73 | Temp 98.2°F | Resp 19

## 2023-05-24 DIAGNOSIS — Z5111 Encounter for antineoplastic chemotherapy: Secondary | ICD-10-CM | POA: Diagnosis not present

## 2023-05-24 DIAGNOSIS — C259 Malignant neoplasm of pancreas, unspecified: Secondary | ICD-10-CM

## 2023-05-24 MED ORDER — PEGFILGRASTIM-CBQV 6 MG/0.6ML ~~LOC~~ SOSY
6.0000 mg | PREFILLED_SYRINGE | Freq: Once | SUBCUTANEOUS | Status: AC
  Administered 2023-05-24: 6 mg via SUBCUTANEOUS
  Filled 2023-05-24: qty 0.6

## 2023-05-24 MED ORDER — SODIUM CHLORIDE 0.9% FLUSH
10.0000 mL | INTRAVENOUS | Status: DC | PRN
Start: 2023-05-24 — End: 2023-05-24
  Administered 2023-05-24: 10 mL

## 2023-05-24 MED ORDER — HEPARIN SOD (PORK) LOCK FLUSH 100 UNIT/ML IV SOLN
500.0000 [IU] | Freq: Once | INTRAVENOUS | Status: AC | PRN
  Administered 2023-05-24: 500 [IU]

## 2023-05-24 NOTE — Patient Instructions (Signed)

## 2023-05-25 ENCOUNTER — Telehealth: Payer: Self-pay | Admitting: *Deleted

## 2023-05-25 LAB — CANCER ANTIGEN 19-9: CA 19-9: 232295 U/mL — ABNORMAL HIGH (ref 0–35)

## 2023-05-25 NOTE — Telephone Encounter (Signed)
 Debbie [name] called with concern about her recent CA 19.9 result of [phone] (going up instead of down). Asking for Dr. Kalman Drape thoughts on this. Has had # 3 cycles of FOLFIRINOX.

## 2023-05-30 ENCOUNTER — Other Ambulatory Visit: Payer: Self-pay | Admitting: Oncology

## 2023-06-05 ENCOUNTER — Inpatient Hospital Stay: Payer: 59

## 2023-06-05 ENCOUNTER — Encounter: Payer: Self-pay | Admitting: Nurse Practitioner

## 2023-06-05 ENCOUNTER — Inpatient Hospital Stay (HOSPITAL_BASED_OUTPATIENT_CLINIC_OR_DEPARTMENT_OTHER): Payer: 59 | Admitting: Nurse Practitioner

## 2023-06-05 VITALS — BP 128/85 | HR 86 | Temp 97.8°F | Resp 18

## 2023-06-05 VITALS — BP 129/83 | HR 85 | Temp 97.9°F | Resp 18 | Ht 63.0 in | Wt 229.6 lb

## 2023-06-05 DIAGNOSIS — C259 Malignant neoplasm of pancreas, unspecified: Secondary | ICD-10-CM

## 2023-06-05 DIAGNOSIS — Z5111 Encounter for antineoplastic chemotherapy: Secondary | ICD-10-CM | POA: Diagnosis not present

## 2023-06-05 LAB — CBC WITH DIFFERENTIAL (CANCER CENTER ONLY)
Abs Immature Granulocytes: 0.53 10*3/uL — ABNORMAL HIGH (ref 0.00–0.07)
Basophils Absolute: 0.1 10*3/uL (ref 0.0–0.1)
Basophils Relative: 1 %
Eosinophils Absolute: 0.3 10*3/uL (ref 0.0–0.5)
Eosinophils Relative: 3 %
HCT: 29.7 % — ABNORMAL LOW (ref 36.0–46.0)
Hemoglobin: 9.9 g/dL — ABNORMAL LOW (ref 12.0–15.0)
Immature Granulocytes: 6 %
Lymphocytes Relative: 24 %
Lymphs Abs: 2.2 10*3/uL (ref 0.7–4.0)
MCH: 31.5 pg (ref 26.0–34.0)
MCHC: 33.3 g/dL (ref 30.0–36.0)
MCV: 94.6 fL (ref 80.0–100.0)
Monocytes Absolute: 0.9 10*3/uL (ref 0.1–1.0)
Monocytes Relative: 9 %
Neutro Abs: 5.3 10*3/uL (ref 1.7–7.7)
Neutrophils Relative %: 57 %
Platelet Count: 123 10*3/uL — ABNORMAL LOW (ref 150–400)
RBC: 3.14 MIL/uL — ABNORMAL LOW (ref 3.87–5.11)
RDW: 17.9 % — ABNORMAL HIGH (ref 11.5–15.5)
WBC Count: 9.2 10*3/uL (ref 4.0–10.5)
nRBC: 1.1 % — ABNORMAL HIGH (ref 0.0–0.2)

## 2023-06-05 LAB — CMP (CANCER CENTER ONLY)
ALT: 29 U/L (ref 0–44)
AST: 35 U/L (ref 15–41)
Albumin: 4.5 g/dL (ref 3.5–5.0)
Alkaline Phosphatase: 141 U/L — ABNORMAL HIGH (ref 38–126)
Anion gap: 10 (ref 5–15)
BUN: 13 mg/dL (ref 6–20)
CO2: 27 mmol/L (ref 22–32)
Calcium: 10 mg/dL (ref 8.9–10.3)
Chloride: 105 mmol/L (ref 98–111)
Creatinine: 0.71 mg/dL (ref 0.44–1.00)
GFR, Estimated: >60 mL/min (ref >60)
Glucose, Bld: 143 mg/dL — ABNORMAL HIGH (ref 70–99)
Potassium: 3.8 mmol/L (ref 3.5–5.1)
Sodium: 142 mmol/L (ref 135–145)
Total Bilirubin: 0.4 mg/dL (ref 0.0–1.2)
Total Protein: 7.2 g/dL (ref 6.5–8.1)

## 2023-06-05 LAB — HCG, SERUM, QUALITATIVE: Preg, Serum: NEGATIVE

## 2023-06-05 MED ORDER — DEXTROSE 5 % IV SOLN: INTRAVENOUS | Status: DC

## 2023-06-05 MED ORDER — PALONOSETRON HCL INJECTION 0.25 MG/5ML
0.2500 mg | Freq: Once | INTRAVENOUS | Status: AC
  Administered 2023-06-05: 0.25 mg via INTRAVENOUS
  Filled 2023-06-05: qty 5

## 2023-06-05 MED ORDER — SODIUM CHLORIDE 0.9 % IV SOLN
400.0000 mg/m2 | Freq: Once | INTRAVENOUS | Status: AC
  Administered 2023-06-05: 856 mg via INTRAVENOUS
  Filled 2023-06-05: qty 42.8

## 2023-06-05 MED ORDER — OXALIPLATIN CHEMO INJECTION 100 MG/20ML
83.0000 mg/m2 | Freq: Once | INTRAVENOUS | Status: AC
  Administered 2023-06-05: 180 mg via INTRAVENOUS
  Filled 2023-06-05: qty 36

## 2023-06-05 MED ORDER — SODIUM CHLORIDE 0.9 % IV SOLN
150.0000 mg/m2 | Freq: Once | INTRAVENOUS | Status: AC
  Administered 2023-06-05: 320 mg via INTRAVENOUS
  Filled 2023-06-05: qty 5

## 2023-06-05 MED ORDER — ATROPINE SULFATE 1 MG/ML IV SOLN
0.5000 mg | Freq: Once | INTRAVENOUS | Status: AC | PRN
  Administered 2023-06-05: 0.5 mg via INTRAVENOUS
  Filled 2023-06-05: qty 1

## 2023-06-05 MED ORDER — SODIUM CHLORIDE 0.9 % IV SOLN
150.0000 mg | Freq: Once | INTRAVENOUS | Status: AC
  Administered 2023-06-05: 150 mg via INTRAVENOUS
  Filled 2023-06-05: qty 150

## 2023-06-05 MED ORDER — DEXAMETHASONE SODIUM PHOSPHATE 10 MG/ML IJ SOLN
10.0000 mg | Freq: Once | INTRAMUSCULAR | Status: AC
  Administered 2023-06-05: 10 mg via INTRAVENOUS
  Filled 2023-06-05: qty 1

## 2023-06-05 MED ORDER — SODIUM CHLORIDE 0.9 % IV SOLN
2400.0000 mg/m2 | INTRAVENOUS | Status: DC
  Administered 2023-06-05: 5000 mg via INTRAVENOUS
  Filled 2023-06-05: qty 100

## 2023-06-05 NOTE — Progress Notes (Signed)
 Patient presents today for chemotherapy/immunoitherapy infusion of Oxaliplatin, leucovorin, Irinotecan, and Adrucil. Patient is in satisfactory condition with no new complaints voiced.  Vital signs are stable.  Labs reviewed by Lonna Cobb NP during the office visit and all labs are within treatment parameters.  We will proceed with treatment per NP/MD orders.   Patient tolerated treatment well with no complaints voiced.  Patient left ambulatory in stable condition.  Vital signs stable at discharge.  Follow up as scheduled.

## 2023-06-05 NOTE — Progress Notes (Signed)
 Patient seen by Lonna Cobb NP today  Vitals are within treatment parameters:Yes   Labs are within treatment parameters: Yes   Treatment plan has been signed: Yes   Per physician team, Patient is ready for treatment and there are NO modifications to the treatment plan.

## 2023-06-05 NOTE — Progress Notes (Signed)
 Patients port flushed without difficulty.  Good blood return noted with no bruising or swelling noted at site.  Patient remains accessed for treatment.

## 2023-06-05 NOTE — Progress Notes (Signed)
  Debbie [name]   Diagnosis: Pancreas cancer  INTERVAL HISTORY:   Debbie [name] returns as scheduled.  She completed cycle 3 FOLFIRINOX 05/22/2023.  She denies significant nausea.  No mouth sores.  She had a few episodes of diarrhea, resolved with Imodium.  Cold sensitivity lasted a little over a week in her hands.  No persistent neuropathy symptoms.  No hand or foot pain or redness.  Objective:  Vital signs in last 24 hours:  Blood pressure 129/83, pulse 85, temperature 97.9 F (36.6 C), temperature source Temporal, resp. rate 18, height 5\' 3"  (1.6 m), weight 229 lb 9.6 oz (104.1 kg), SpO2 98%.    HEENT: No thrush or ulcers. Resp: Lungs clear bilaterally. Cardio: Regular rate and rhythm. GI: Abdomen soft and nontender.  No hepatosplenomegaly. Vascular: No leg edema. Neuro: Vibratory sense intact over the fingertips per tuning fork exam. Skin: Palms without erythema. Port-A-Cath without erythema.  Lab Results:  Lab Results  Component Value Date   WBC 9.2 06/05/2023   HGB 9.9 (L) 06/05/2023   HCT 29.7 (L) 06/05/2023   MCV 94.6 06/05/2023   PLT 123 (L) 06/05/2023   NEUTROABS 5.3 06/05/2023    Imaging:  No results found.  Medications: I have reviewed the patient's current medications.  Assessment/Plan: Pancreas cancer CT abdomen/pelvis 03/24/2023: 3 x 4.6 cm pancreas tail mass, multiple liver lesions consistent with metastases, fat stranding surrounding the second and third ports of the duodenum and pancreas head/uncinate  CT chest 03/27/2023: New spiculated nodular density in the right lower lobe, 8 x 4 mm EUS 03/27/2023: Pancreas tail mass, liver lesions, T2 N0 M1, needle biopsy of pancreas mass, adenocarcinoma Foundation 1: K-ras G 12R, HRD signature negative, MSS, tumor mutation burden 0, PD-L1 TPS 1% Elevated CA 19-9 (121,720 on 03/26/2023) Cycle 1 FOLFIRINOX 04/10/2023 Chemotherapy held 04/24/2023 due to neutropenia Cycle 2 FOLFIRINOX  05/04/2023, Udenyca Cycle 3 FOLFIRINOX 05/22/2023, Udenyca Cycle 4 FOLFIRINOX 06/05/2023, Udenyca   2.  Diabetes 3.  Acute abdominal pain and nausea/vomiting 03/23/2023 secondary to pancreatitis.  Improved 03/31/2023 4.  Hypertension. 5.  Asthma 6.  Hypothyroidism 7.  Hyperlipidemia 8.  Acute pancreatitis 03/24/2023  Disposition: Ms. Chirico appears stable.  She has completed 3 cycles of FOLFIRINOX.  She is tolerating treatment well.  Plan to proceed with cycle 4 today as scheduled.  Restaging CTs after 5 cycles.  CBC and chemistry panel reviewed.  Labs adequate to proceed as above.  She has mild thrombocytopenia.  She will contact the office with bleeding.  She will return for follow-up and treatment in 2 weeks.  We are available to see her sooner if needed.    Lonna Cobb ANP/GNP-BC   06/05/2023  9:36 AM

## 2023-06-05 NOTE — Patient Instructions (Addendum)
 [facility] - A DEPT OF [facility]  Discharge Instructions: Thank you for choosing [facility] to provide your oncology and hematology care.   If you have a lab appointment with the Cancer Center, please go directly to the Cancer Center and check in at the registration area.   Wear comfortable clothing and clothing appropriate for easy access to any Portacath or PICC line.   We strive to give you quality time with your provider. You may need to reschedule your appointment if you arrive late (15 or more minutes).  Arriving late affects you and other patients whose appointments are after yours.  Also, if you miss three or more appointments without notifying the office, you may be dismissed from the clinic at the provider's discretion.      For prescription refill requests, have your pharmacy contact our office and allow 72 hours for refills to be completed.    Today you received the following chemotherapy and/or immunotherapy agents Oxaliplatin, Leucovorin, Irinotecan, and Adrucil.  Oxaliplatin Injection What is this medication? OXALIPLATIN (ox AL i PLA tin) treats colorectal cancer. It works by slowing down the growth of cancer cells. This medicine may be used for other purposes; ask your health care provider or pharmacist if you have questions. COMMON BRAND NAME(S): Eloxatin What should I tell my care team before I take this medication? They need to know if you have any of these conditions: Heart disease History of irregular heartbeat or rhythm Liver disease Low blood cell levels (white cells, red cells, and platelets) Lung or breathing disease, such as asthma Take medications that treat or prevent blood clots Tingling of the fingers, toes, or other nerve disorder An unusual or allergic reaction to oxaliplatin, other medications, foods, dyes, or preservatives If you or your partner are pregnant or trying to get pregnant Breast-feeding How  should I use this medication? This medication is injected into a vein. It is given by your care team in a hospital or clinic setting. Talk to your care team about the use of this medication in children. Special care may be needed. Overdosage: If you think you have taken too much of this medicine contact a poison control center or emergency room at once. NOTE: This medicine is only for you. Do not share this medicine with others. What if I miss a dose? Keep appointments for follow-up doses. It is important not to miss a dose. Call your care team if you are unable to keep an appointment. What may interact with this medication? Do not take this medication with any of the following: Cisapride Dronedarone Pimozide Thioridazine This medication may also interact with the following: Aspirin and aspirin-like medications Certain medications that treat or prevent blood clots, such as warfarin, apixaban, dabigatran, and rivaroxaban Cisplatin Cyclosporine Diuretics Medications for infection, such as acyclovir, adefovir, amphotericin B, bacitracin, cidofovir, foscarnet, ganciclovir, gentamicin, pentamidine, vancomycin NSAIDs, medications for pain and inflammation, such as ibuprofen or naproxen Other medications that cause heart rhythm changes Pamidronate Zoledronic acid This list may not describe all possible interactions. Give your health care provider a list of all the medicines, herbs, non-prescription drugs, or dietary supplements you use. Also tell them if you smoke, drink alcohol, or use illegal drugs. Some items may interact with your medicine. What should I watch for while using this medication? Your condition will be monitored carefully while you are receiving this medication. You may need blood work while taking this medication. This medication may make  you feel generally unwell. This is not uncommon as chemotherapy can affect healthy cells as well as cancer cells. Report any side effects.  Continue your course of treatment even though you feel ill unless your care team tells you to stop. This medication may increase your risk of getting an infection. Call your care team for advice if you get a fever, chills, sore throat, or other symptoms of a cold or flu. Do not treat yourself. Try to avoid being around people who are sick. Avoid taking medications that contain aspirin, acetaminophen, ibuprofen, naproxen, or ketoprofen unless instructed by your care team. These medications may hide a fever. Be careful brushing or flossing your teeth or using a toothpick because you may get an infection or bleed more easily. If you have any dental work done, tell your dentist you are receiving this medication. This medication can make you more sensitive to cold. Do not drink cold drinks or use ice. Cover exposed skin before coming in contact with cold temperatures or cold objects. When out in cold weather wear warm clothing and cover your mouth and nose to warm the air that goes into your lungs. Tell your care team if you get sensitive to the cold. Talk to your care team if you or your partner are pregnant or think either of you might be pregnant. This medication can cause serious birth defects if taken during pregnancy and for 9 months after the last dose. A negative pregnancy test is required before starting this medication. A reliable form of contraception is recommended while taking this medication and for 9 months after the last dose. Talk to your care team about effective forms of contraception. Do not father a child while taking this medication and for 6 months after the last dose. Use a condom while having sex during this time period. Do not breastfeed while taking this medication and for 3 months after the last dose. This medication may cause infertility. Talk to your care team if you are concerned about your fertility. What side effects may I notice from receiving this medication? Side effects that  you should report to your care team as soon as possible: Allergic reactions--skin rash, itching, hives, swelling of the face, lips, tongue, or throat Bleeding--bloody or black, tar-like stools, vomiting blood or brown material that looks like coffee grounds, red or dark brown urine, small red or purple spots on skin, unusual bruising or bleeding Dry cough, shortness of breath or trouble breathing Heart rhythm changes--fast or irregular heartbeat, dizziness, feeling faint or lightheaded, chest pain, trouble breathing Infection--fever, chills, cough, sore throat, wounds that don't heal, pain or trouble when passing urine, general feeling of discomfort or being unwell Liver injury--right upper belly pain, loss of appetite, nausea, light-colored stool, dark yellow or brown urine, yellowing skin or eyes, unusual weakness or fatigue Low red blood cell level--unusual weakness or fatigue, dizziness, headache, trouble breathing Muscle injury--unusual weakness or fatigue, muscle pain, dark yellow or brown urine, decrease in amount of urine Pain, tingling, or numbness in the hands or feet Sudden and severe headache, confusion, change in vision, seizures, which may be signs of posterior reversible encephalopathy syndrome (PRES) Unusual bruising or bleeding Side effects that usually do not require medical attention (report to your care team if they continue or are bothersome): Diarrhea Nausea Pain, redness, or swelling with sores inside the mouth or throat Unusual weakness or fatigue Vomiting This list may not describe all possible side effects. Call your doctor for medical advice about  side effects. You may report side effects to FDA at 1-800-FDA-1088. Where should I keep my medication? This medication is given in a hospital or clinic. It will not be stored at home. NOTE: This sheet is a summary. It may not cover all possible information. If you have questions about this medicine, talk to your doctor,  pharmacist, or health care provider.  2024 Elsevier/Gold Standard (2023-02-17 00:00:00)  Leucovorin Injection What is this medication? LEUCOVORIN (loo koe VOR in) prevents side effects from certain medications, such as methotrexate. It works by increasing folate levels. This helps protect healthy cells in your body. It may also be used to treat anemia caused by low levels of folate. It can also be used with fluorouracil, a type of chemotherapy, to treat colorectal cancer. It works by increasing the effects of fluorouracil in the body. This medicine may be used for other purposes; ask your health care provider or pharmacist if you have questions. What should I tell my care team before I take this medication? They need to know if you have any of these conditions: Anemia from low levels of vitamin B12 in the blood An unusual or allergic reaction to leucovorin, folic acid, other medications, foods, dyes, or preservatives Pregnant or trying to get pregnant Breastfeeding How should I use this medication? This medication is injected into a vein or a muscle. It is given by your care team in a hospital or clinic setting. Talk to your care team about the use of this medication in children. Special care may be needed. Overdosage: If you think you have taken too much of this medicine contact a poison control center or emergency room at once. NOTE: This medicine is only for you. Do not share this medicine with others. What if I miss a dose? Keep appointments for follow-up doses. It is important not to miss your dose. Call your care team if you are unable to keep an appointment. What may interact with this medication? Capecitabine Fluorouracil Phenobarbital Phenytoin Primidone Trimethoprim;sulfamethoxazole This list may not describe all possible interactions. Give your health care provider a list of all the medicines, herbs, non-prescription drugs, or dietary supplements you use. Also tell them if you  smoke, drink alcohol, or use illegal drugs. Some items may interact with your medicine. What should I watch for while using this medication? Your condition will be monitored carefully while you are receiving this medication. This medication may increase the side effects of 5-fluorouracil. Tell your care team if you have diarrhea or mouth sores that do not get better or that get worse. What side effects may I notice from receiving this medication? Side effects that you should report to your care team as soon as possible: Allergic reactions--skin rash, itching, hives, swelling of the face, lips, tongue, or throat This list may not describe all possible side effects. Call your doctor for medical advice about side effects. You may report side effects to FDA at 1-800-FDA-1088. Where should I keep my medication? This medication is given in a hospital or clinic. It will not be stored at home. NOTE: This sheet is a summary. It may not cover all possible information. If you have questions about this medicine, talk to your doctor, pharmacist, or health care provider.  2024 Elsevier/Gold Standard (2021-08-10 00:00:00)  Irinotecan Injection What is this medication? IRINOTECAN (ir in oh TEE kan) treats some types of cancer. It works by slowing down the growth of cancer cells. This medicine may be used for other purposes; ask your  health care provider or pharmacist if you have questions. COMMON BRAND NAME(S): Camptosar What should I tell my care team before I take this medication? They need to know if you have any of these conditions: Dehydration Diarrhea Infection, especially a viral infection, such as chickenpox, cold sores, herpes Liver disease Low blood cell levels (white cells, red cells, and platelets) Low levels of electrolytes, such as calcium, magnesium, or potassium in your blood Recent or ongoing radiation An unusual or allergic reaction to irinotecan, other medications, foods, dyes, or  preservatives If you or your partner are pregnant or trying to get pregnant Breast-feeding How should I use this medication? This medication is injected into a vein. It is given by your care team in a hospital or clinic setting. Talk to your care team about the use of this medication in children. Special care may be needed. Overdosage: If you think you have taken too much of this medicine contact a poison control center or emergency room at once. NOTE: This medicine is only for you. Do not share this medicine with others. What if I miss a dose? Keep appointments for follow-up doses. It is important not to miss your dose. Call your care team if you are unable to keep an appointment. What may interact with this medication? Do not take this medication with any of the following: Cobicistat Itraconazole This medication may also interact with the following: Certain antibiotics, such as clarithromycin, rifampin, rifabutin Certain antivirals for HIV or AIDS Certain medications for fungal infections, such as ketoconazole, posaconazole, voriconazole Certain medications for seizures, such as carbamazepine, phenobarbital, phenytoin Gemfibrozil Nefazodone St. John's wort This list may not describe all possible interactions. Give your health care provider a list of all the medicines, herbs, non-prescription drugs, or dietary supplements you use. Also tell them if you smoke, drink alcohol, or use illegal drugs. Some items may interact with your medicine. What should I watch for while using this medication? Your condition will be monitored carefully while you are receiving this medication. You may need blood work while taking this medication. This medication may make you feel generally unwell. This is not uncommon as chemotherapy can affect healthy cells as well as cancer cells. Report any side effects. Continue your course of treatment even though you feel ill unless your care team tells you to stop. This  medication can cause serious side effects. To reduce the risk, your care team may give you other medications to take before receiving this one. Be sure to follow the directions from your care team. This medication may affect your coordination, reaction time, or judgement. Do not drive or operate machinery until you know how this medication affects you. Sit up or stand slowly to reduce the risk of dizzy or fainting spells. Drinking alcohol with this medication can increase the risk of these side effects. This medication may increase your risk of getting an infection. Call your care team for advice if you get a fever, chills, sore throat, or other symptoms of a cold or flu. Do not treat yourself. Try to avoid being around people who are sick. Avoid taking medications that contain aspirin, acetaminophen, ibuprofen, naproxen, or ketoprofen unless instructed by your care team. These medications may hide a fever. This medication may increase your risk to bruise or bleed. Call your care team if you notice any unusual bleeding. Be careful brushing or flossing your teeth or using a toothpick because you may get an infection or bleed more easily. If you have any  dental work done, tell your dentist you are receiving this medication. Talk to your care team if you or your partner are pregnant or think either of you might be pregnant. This medication can cause serious birth defects if taken during pregnancy and for 6 months after the last dose. You will need a negative pregnancy test before starting this medication. Contraception is recommended while taking this medication and for 6 months after the last dose. Your care team can help you find the option that works for you. Do not father a child while taking this medication and for 3 months after the last dose. Use a condom for contraception during this time period. Do not breastfeed while taking this medication and for 7 days after the last dose. This medication may  cause infertility. Talk to your care team if you are concerned about your fertility. What side effects may I notice from receiving this medication? Side effects that you should report to your care team as soon as possible: Allergic reactions--skin rash, itching, hives, swelling of the face, lips, tongue, or throat Dry cough, shortness of breath or trouble breathing Increased saliva or tears, increased sweating, stomach cramping, diarrhea, small pupils, unusual weakness or fatigue, slow heartbeat Infection--fever, chills, cough, sore throat, wounds that don't heal, pain or trouble when passing urine, general feeling of discomfort or being unwell Kidney injury--decrease in the amount of urine, swelling of the ankles, hands, or feet Low red blood cell level--unusual weakness or fatigue, dizziness, headache, trouble breathing Severe or prolonged diarrhea Unusual bruising or bleeding Side effects that usually do not require medical attention (report to your care team if they continue or are bothersome): Constipation Diarrhea Hair loss Loss of appetite Nausea Stomach pain This list may not describe all possible side effects. Call your doctor for medical advice about side effects. You may report side effects to FDA at 1-800-FDA-1088. Where should I keep my medication? This medication is given in a hospital or clinic. It will not be stored at home. NOTE: This sheet is a summary. It may not cover all possible information. If you have questions about this medicine, talk to your doctor, pharmacist, or health care provider.  2024 Elsevier/Gold Standard (2021-07-19 00:00:00)  Fluorouracil Injection What is this medication? FLUOROURACIL (flure oh YOOR a sil) treats some types of cancer. It works by slowing down the growth of cancer cells. This medicine may be used for other purposes; ask your health care provider or pharmacist if you have questions. COMMON BRAND NAME(S): Adrucil What should I tell my  care team before I take this medication? They need to know if you have any of these conditions: Blood disorders Dihydropyrimidine dehydrogenase (DPD) deficiency Infection, such as chickenpox, cold sores, herpes Kidney disease Liver disease Poor nutrition Recent or ongoing radiation therapy An unusual or allergic reaction to fluorouracil, other medications, foods, dyes, or preservatives If you or your partner are pregnant or trying to get pregnant Breast-feeding How should I use this medication? This medication is injected into a vein. It is administered by your care team in a hospital or clinic setting. Talk to your care team about the use of this medication in children. Special care may be needed. Overdosage: If you think you have taken too much of this medicine contact a poison control center or emergency room at once. NOTE: This medicine is only for you. Do not share this medicine with others. What if I miss a dose? Keep appointments for follow-up doses. It is important not  to miss your dose. Call your care team if you are unable to keep an appointment. What may interact with this medication? Do not take this medication with any of the following: Live virus vaccines This medication may also interact with the following: Medications that treat or prevent blood clots, such as warfarin, enoxaparin, dalteparin This list may not describe all possible interactions. Give your health care provider a list of all the medicines, herbs, non-prescription drugs, or dietary supplements you use. Also tell them if you smoke, drink alcohol, or use illegal drugs. Some items may interact with your medicine. What should I watch for while using this medication? Your condition will be monitored carefully while you are receiving this medication. This medication may make you feel generally unwell. This is not uncommon as chemotherapy can affect healthy cells as well as cancer cells. Report any side effects.  Continue your course of treatment even though you feel ill unless your care team tells you to stop. In some cases, you may be given additional medications to help with side effects. Follow all directions for their use. This medication may increase your risk of getting an infection. Call your care team for advice if you get a fever, chills, sore throat, or other symptoms of a cold or flu. Do not treat yourself. Try to avoid being around people who are sick. This medication may increase your risk to bruise or bleed. Call your care team if you notice any unusual bleeding. Be careful brushing or flossing your teeth or using a toothpick because you may get an infection or bleed more easily. If you have any dental work done, tell your dentist you are receiving this medication. Avoid taking medications that contain aspirin, acetaminophen, ibuprofen, naproxen, or ketoprofen unless instructed by your care team. These medications may hide a fever. Do not treat diarrhea with over the counter products. Contact your care team if you have diarrhea that lasts more than 2 days or if it is severe and watery. This medication can make you more sensitive to the sun. Keep out of the sun. If you cannot avoid being in the sun, wear protective clothing and sunscreen. Do not use sun lamps, tanning beds, or tanning booths. Talk to your care team if you or your partner wish to become pregnant or think you might be pregnant. This medication can cause serious birth defects if taken during pregnancy and for 3 months after the last dose. A reliable form of contraception is recommended while taking this medication and for 3 months after the last dose. Talk to your care team about effective forms of contraception. Do not father a child while taking this medication and for 3 months after the last dose. Use a condom while having sex during this time period. Do not breastfeed while taking this medication. This medication may cause  infertility. Talk to your care team if you are concerned about your fertility. What side effects may I notice from receiving this medication? Side effects that you should report to your care team as soon as possible: Allergic reactions--skin rash, itching, hives, swelling of the face, lips, tongue, or throat Heart attack--pain or tightness in the chest, shoulders, arms, or jaw, nausea, shortness of breath, cold or clammy skin, feeling faint or lightheaded Heart failure--shortness of breath, swelling of the ankles, feet, or hands, sudden weight gain, unusual weakness or fatigue Heart rhythm changes--fast or irregular heartbeat, dizziness, feeling faint or lightheaded, chest pain, trouble breathing High ammonia level--unusual weakness or fatigue, confusion,  loss of appetite, nausea, vomiting, seizures Infection--fever, chills, cough, sore throat, wounds that don't heal, pain or trouble when passing urine, general feeling of discomfort or being unwell Low red blood cell level--unusual weakness or fatigue, dizziness, headache, trouble breathing Pain, tingling, or numbness in the hands or feet, muscle weakness, change in vision, confusion or trouble speaking, loss of balance or coordination, trouble walking, seizures Redness, swelling, and blistering of the skin over hands and feet Severe or prolonged diarrhea Unusual bruising or bleeding Side effects that usually do not require medical attention (report to your care team if they continue or are bothersome): Dry skin Headache Increased tears Nausea Pain, redness, or swelling with sores inside the mouth or throat Sensitivity to light Vomiting This list may not describe all possible side effects. Call your doctor for medical advice about side effects. You may report side effects to FDA at 1-800-FDA-1088. Where should I keep my medication? This medication is given in a hospital or clinic. It will not be stored at home. NOTE: This sheet is a summary.  It may not cover all possible information. If you have questions about this medicine, talk to your doctor, pharmacist, or health care provider.  2024 Elsevier/Gold Standard (2021-07-13 00:00:00)      To help prevent nausea and vomiting after your treatment, we encourage you to take your nausea medication as directed.  BELOW ARE SYMPTOMS THAT SHOULD BE REPORTED IMMEDIATELY: *FEVER GREATER THAN 100.4 F (38 C) OR HIGHER *CHILLS OR SWEATING *NAUSEA AND VOMITING THAT IS NOT CONTROLLED WITH YOUR NAUSEA MEDICATION *UNUSUAL SHORTNESS OF BREATH *UNUSUAL BRUISING OR BLEEDING *URINARY PROBLEMS (pain or burning when urinating, or frequent urination) *BOWEL PROBLEMS (unusual diarrhea, constipation, pain near the anus) TENDERNESS IN MOUTH AND THROAT WITH OR WITHOUT PRESENCE OF ULCERS (sore throat, sores in mouth, or a toothache) UNUSUAL RASH, SWELLING OR PAIN  UNUSUAL VAGINAL DISCHARGE OR ITCHING   Items with * indicate a potential emergency and should be followed up as soon as possible or go to the Emergency Department if any problems should occur.  Please show the CHEMOTHERAPY ALERT CARD or IMMUNOTHERAPY ALERT CARD at check-in to the Emergency Department and triage nurse.  Should you have questions after your visit or need to cancel or reschedule your appointment, please contact [facility]  Dept: [phone]  and follow the prompts.  Office hours are 8:00 a.m. to 4:30 p.m. Monday - Friday. Please note that voicemails left after 4:00 p.m. may not be returned until the following business day.  We are closed weekends and major holidays. You have access to a nurse at all times for urgent questions. Please call the main number to the clinic Dept: [phone] and follow the prompts.   For any non-urgent questions, you may also contact your provider using MyChart. We now offer e-Visits for anyone 23 and older to request care online for non-urgent  symptoms. For details visit [email or web address].   Also download the MyChart app! Go to the app store, search "MyChart", open the app, select [place], and log in with your MyChart username and password.  PUMP STOP 06/07/2023 @ 1:45PM.

## 2023-06-07 ENCOUNTER — Inpatient Hospital Stay: Payer: 59

## 2023-06-07 VITALS — BP 110/78 | HR 70 | Temp 98.6°F | Resp 18

## 2023-06-07 DIAGNOSIS — C259 Malignant neoplasm of pancreas, unspecified: Secondary | ICD-10-CM

## 2023-06-07 DIAGNOSIS — Z5111 Encounter for antineoplastic chemotherapy: Secondary | ICD-10-CM | POA: Diagnosis not present

## 2023-06-07 MED ORDER — SODIUM CHLORIDE 0.9% FLUSH
10.0000 mL | INTRAVENOUS | Status: DC | PRN
  Administered 2023-06-07: 10 mL

## 2023-06-07 MED ORDER — PEGFILGRASTIM-CBQV 6 MG/0.6ML ~~LOC~~ SOSY
6.0000 mg | PREFILLED_SYRINGE | Freq: Once | SUBCUTANEOUS | Status: AC
  Administered 2023-06-07: 6 mg via SUBCUTANEOUS
  Filled 2023-06-07: qty 0.6

## 2023-06-07 MED ORDER — HEPARIN SOD (PORK) LOCK FLUSH 100 UNIT/ML IV SOLN
500.0000 [IU] | Freq: Once | INTRAVENOUS | Status: AC | PRN
  Administered 2023-06-07: 500 [IU]

## 2023-06-07 NOTE — Patient Instructions (Signed)
 Implanted [facility] Guide An implanted port is a device that is placed under the skin. It is usually placed in the chest. The device may vary based on the need. Implanted ports can be used to give IV medicine, to take blood, or to give fluids. You may have an implanted port if: You need IV medicine that would be irritating to the small veins in your hands or arms. You need IV medicines, such as chemotherapy, for a long period of time. You need IV nutrition for a long period of time. You may have fewer limitations when using a port than you would if you used other types of long-term IVs. You will also likely be able to return to normal activities after your incision heals. An implanted port has two main parts: Reservoir. The reservoir is the part where a needle is inserted to give medicines or draw blood. The reservoir is round. After the port is placed, it appears as a small, raised area under your skin. Catheter. The catheter is a small, thin tube that connects the reservoir to a vein. Medicine that is inserted into the reservoir goes into the catheter and then into the vein. How is my port accessed? To access your port: A numbing cream may be placed on the skin over the port site. Your health care provider will put on a mask and sterile gloves. The skin over your port will be cleaned carefully with a germ-killing soap and allowed to dry. Your health care provider will gently pinch the port and insert a needle into it. Your health care provider will check for a blood return to make sure the port is in the vein and is still working (patent). If your port needs to remain accessed to get medicine continuously (constant infusion), your health care provider will place a clear bandage (dressing) over the needle site. The dressing and needle will need to be changed every week, or as told by your health care provider. What is flushing? Flushing helps keep the port working. Follow instructions from your  health care provider about how and when to flush the port. Ports are usually flushed with saline solution or a medicine called heparin. The need for flushing will depend on how the port is used: If the port is only used from time to time to give medicines or draw blood, the port may need to be flushed: Before and after medicines have been given. Before and after blood has been drawn. As part of routine maintenance. Flushing may be recommended every 4-6 weeks. If a constant infusion is running, the port may not need to be flushed. Throw away any syringes in a disposal container that is meant for sharp items (sharps container). You can buy a sharps container from a pharmacy, or you can make one by using an empty hard plastic bottle with a cover. How long will my port stay implanted? The port can stay in for as long as your health care provider thinks it is needed. When it is time for the port to come out, a surgery will be done to remove it. The surgery will be similar to the procedure that was done to put the port in. Follow these instructions at home: Caring for your port and port site Flush your port as told by your health care provider. If you need an infusion over several days, follow instructions from your health care provider about how to take care of your port site. Make sure you: Change your  dressing as told by your health care provider. Wash your hands with soap and water for at least 20 seconds before and after you change your dressing. If soap and water are not available, use alcohol-based hand sanitizer. Place any used dressings or infusion bags into a plastic bag. Throw that bag in the trash. Keep the dressing that covers the needle clean and dry. Do not get it wet. Do not use scissors or sharp objects near the infusion tubing. Keep any external tubes clamped, unless they are being used. Check your port site every day for signs of infection. Check for: Redness, swelling, or  pain. Fluid or blood. Warmth. Pus or a bad smell. Protect the skin around the port site. Avoid wearing bra straps that rub or irritate the site. Protect the skin around your port from seat belts. Place a soft pad over your chest if needed. Bathe or shower as told by your health care provider. The site may get wet as long as you are not actively receiving an infusion. General instructions  Return to your normal activities as told by your health care provider. Ask your health care provider what activities are safe for you. Carry a medical alert card or wear a medical alert bracelet at all times. This will let health care providers know that you have an implanted port in case of an emergency. Where to find more information American Cancer Society: www.cancer.org American Society of Clinical Oncology: www.cancer.net Contact a health care provider if: You have a fever or chills. You have redness, swelling, or pain at the port site. You have fluid or blood coming from your port site. Your incision feels warm to the touch. You have pus or a bad smell coming from the port site. Summary Implanted ports are usually placed in the chest for long-term IV access. Follow instructions from your health care provider about flushing the port and changing bandages (dressings). Take care of the area around your port by avoiding clothing that puts pressure on the area, and by watching for signs of infection. Protect the skin around your port from seat belts. Place a soft pad over your chest if needed. Contact a health care provider if you have a fever or you have redness, swelling, pain, fluid, or a bad smell at the port site. This information is not intended to replace advice given to you by your health care provider. Make sure you discuss any questions you have with your health care provider. Document Revised: 09/08/2020 Document Reviewed: 09/08/2020 Elsevier Patient Education  2024 Elsevier  Inc.  Pegfilgrastim Injection What is this medication? PEGFILGRASTIM (PEG fil gra stim) lowers the risk of infection in people who are receiving chemotherapy. It works by Systems analyst make more white blood cells, which protects your body from infection. It may also be used to help people who have been exposed to high doses of radiation. This medicine may be used for other purposes; ask your health care provider or pharmacist if you have questions. COMMON BRAND NAME(S): Cherly Hensen, Neulasta, Nyvepria, Stimufend, UDENYCA, UDENYCA ONBODY, Ziextenzo What should I tell my care team before I take this medication? They need to know if you have any of these conditions: Kidney disease Latex allergy Ongoing radiation therapy Sickle cell disease Skin reactions to acrylic adhesives (On-Body Injector only) An unusual or allergic reaction to pegfilgrastim, filgrastim, other medications, foods, dyes, or preservatives Pregnant or trying to get pregnant Breast-feeding How should I use this medication? This medication is for injection  under the skin. If you get this medication at home, you will be taught how to prepare and give the pre-filled syringe or how to use the On-body Injector. Refer to the patient Instructions for Use for detailed instructions. Use exactly as directed. Tell your care team immediately if you suspect that the On-body Injector may not have performed as intended or if you suspect the use of the On-body Injector resulted in a missed or partial dose. It is important that you put your used needles and syringes in a special sharps container. Do not put them in a trash can. If you do not have a sharps container, call your pharmacist or care team to get one. Talk to your care team about the use of this medication in children. While this medication may be prescribed for selected conditions, precautions do apply. Overdosage: If you think you have taken too much of this medicine contact  a poison control center or emergency room at once. NOTE: This medicine is only for you. Do not share this medicine with others. What if I miss a dose? It is important not to miss your dose. Call your care team if you miss your dose. If you miss a dose due to an On-body Injector failure or leakage, a new dose should be administered as soon as possible using a single prefilled syringe for manual use. What may interact with this medication? Interactions have not been studied. This list may not describe all possible interactions. Give your health care provider a list of all the medicines, herbs, non-prescription drugs, or dietary supplements you use. Also tell them if you smoke, drink alcohol, or use illegal drugs. Some items may interact with your medicine. What should I watch for while using this medication? Your condition will be monitored carefully while you are receiving this medication. You may need blood work done while you are taking this medication. Talk to your care team about your risk of cancer. You may be more at risk for certain types of cancer if you take this medication. If you are going to need a MRI, CT scan, or other procedure, tell your care team that you are using this medication (On-Body Injector only). What side effects may I notice from receiving this medication? Side effects that you should report to your care team as soon as possible: Allergic reactions--skin rash, itching, hives, swelling of the face, lips, tongue, or throat Capillary leak syndrome--stomach or muscle pain, unusual weakness or fatigue, feeling faint or lightheaded, decrease in the amount of urine, swelling of the ankles, hands, or feet, trouble breathing High white blood cell level--fever, fatigue, trouble breathing, night sweats, change in vision, weight loss Inflammation of the aorta--fever, fatigue, back, chest, or stomach pain, severe headache Kidney injury (glomerulonephritis)--decrease in the amount of  urine, red or dark brown urine, foamy or bubbly urine, swelling of the ankles, hands, or feet Shortness of breath or trouble breathing Spleen injury--pain in upper left stomach or shoulder Unusual bruising or bleeding Side effects that usually do not require medical attention (report to your care team if they continue or are bothersome): Bone pain Pain in the hands or feet This list may not describe all possible side effects. Call your doctor for medical advice about side effects. You may report side effects to FDA at 1-800-FDA-1088. Where should I keep my medication? Keep out of the reach of children. If you are using this medication at home, you will be instructed on how to store it. Throw away  any unused medication after the expiration date on the label. NOTE: This sheet is a summary. It may not cover all possible information. If you have questions about this medicine, talk to your doctor, pharmacist, or health care provider.  2024 Elsevier/Gold Standard (2021-02-05 00:00:00)

## 2023-06-09 ENCOUNTER — Other Ambulatory Visit: Payer: Self-pay | Admitting: Internal Medicine

## 2023-06-15 ENCOUNTER — Other Ambulatory Visit: Payer: Self-pay | Admitting: Oncology

## 2023-06-19 ENCOUNTER — Inpatient Hospital Stay

## 2023-06-19 ENCOUNTER — Encounter: Payer: Self-pay | Admitting: Nurse Practitioner

## 2023-06-19 ENCOUNTER — Other Ambulatory Visit: Payer: Self-pay

## 2023-06-19 ENCOUNTER — Inpatient Hospital Stay (HOSPITAL_BASED_OUTPATIENT_CLINIC_OR_DEPARTMENT_OTHER): Admitting: Nurse Practitioner

## 2023-06-19 VITALS — BP 124/82 | HR 74 | Temp 98.1°F | Resp 17 | Wt 229.7 lb

## 2023-06-19 VITALS — BP 121/81 | HR 68 | Temp 98.6°F | Resp 18

## 2023-06-19 DIAGNOSIS — Z5111 Encounter for antineoplastic chemotherapy: Secondary | ICD-10-CM | POA: Diagnosis not present

## 2023-06-19 DIAGNOSIS — C259 Malignant neoplasm of pancreas, unspecified: Secondary | ICD-10-CM | POA: Diagnosis not present

## 2023-06-19 LAB — CBC WITH DIFFERENTIAL (CANCER CENTER ONLY)
Abs Immature Granulocytes: 0.51 10*3/uL — ABNORMAL HIGH (ref 0.00–0.07)
Basophils Absolute: 0 10*3/uL (ref 0.0–0.1)
Basophils Relative: 0 %
Eosinophils Absolute: 0.2 10*3/uL (ref 0.0–0.5)
Eosinophils Relative: 3 %
HCT: 29 % — ABNORMAL LOW (ref 36.0–46.0)
Hemoglobin: 9.7 g/dL — ABNORMAL LOW (ref 12.0–15.0)
Immature Granulocytes: 6 %
Lymphocytes Relative: 22 %
Lymphs Abs: 1.9 10*3/uL (ref 0.7–4.0)
MCH: 32.3 pg (ref 26.0–34.0)
MCHC: 33.4 g/dL (ref 30.0–36.0)
MCV: 96.7 fL (ref 80.0–100.0)
Monocytes Absolute: 1 10*3/uL (ref 0.1–1.0)
Monocytes Relative: 12 %
Neutro Abs: 4.9 10*3/uL (ref 1.7–7.7)
Neutrophils Relative %: 57 %
Platelet Count: 120 10*3/uL — ABNORMAL LOW (ref 150–400)
RBC: 3 MIL/uL — ABNORMAL LOW (ref 3.87–5.11)
RDW: 19.2 % — ABNORMAL HIGH (ref 11.5–15.5)
WBC Count: 8.6 10*3/uL (ref 4.0–10.5)
nRBC: 0.6 % — ABNORMAL HIGH (ref 0.0–0.2)

## 2023-06-19 LAB — CMP (CANCER CENTER ONLY)
ALT: 53 U/L — ABNORMAL HIGH (ref 0–44)
AST: 59 U/L — ABNORMAL HIGH (ref 15–41)
Albumin: 4.4 g/dL (ref 3.5–5.0)
Alkaline Phosphatase: 158 U/L — ABNORMAL HIGH (ref 38–126)
Anion gap: 11 (ref 5–15)
BUN: 11 mg/dL (ref 6–20)
CO2: 26 mmol/L (ref 22–32)
Calcium: 9.6 mg/dL (ref 8.9–10.3)
Chloride: 103 mmol/L (ref 98–111)
Creatinine: 0.61 mg/dL (ref 0.44–1.00)
GFR, Estimated: >60 mL/min (ref >60)
Glucose, Bld: 162 mg/dL — ABNORMAL HIGH (ref 70–99)
Potassium: 3.9 mmol/L (ref 3.5–5.1)
Sodium: 140 mmol/L (ref 135–145)
Total Bilirubin: 0.4 mg/dL (ref 0.0–1.2)
Total Protein: 7.1 g/dL (ref 6.5–8.1)

## 2023-06-19 LAB — HCG, SERUM, QUALITATIVE: Preg, Serum: NEGATIVE

## 2023-06-19 MED ORDER — SODIUM CHLORIDE 0.9 % IV SOLN
150.0000 mg | Freq: Once | INTRAVENOUS | Status: AC
  Administered 2023-06-19: 150 mg via INTRAVENOUS
  Filled 2023-06-19: qty 150

## 2023-06-19 MED ORDER — DEXAMETHASONE 4 MG PO TABS: 4.0000 mg | ORAL_TABLET | Freq: Two times a day (BID) | ORAL | 2 refills | Status: AC

## 2023-06-19 MED ORDER — HEPARIN SOD (PORK) LOCK FLUSH 100 UNIT/ML IV SOLN
500.0000 [IU] | Freq: Once | INTRAVENOUS | Status: DC | PRN
Start: 2023-06-19 — End: 2023-06-19

## 2023-06-19 MED ORDER — DEXAMETHASONE SODIUM PHOSPHATE 10 MG/ML IJ SOLN
10.0000 mg | Freq: Once | INTRAMUSCULAR | Status: AC
  Administered 2023-06-19: 10 mg via INTRAVENOUS
  Filled 2023-06-19: qty 1

## 2023-06-19 MED ORDER — DEXTROSE 5 % IV SOLN: INTRAVENOUS | Status: DC

## 2023-06-19 MED ORDER — PALONOSETRON HCL INJECTION 0.25 MG/5ML
0.2500 mg | Freq: Once | INTRAVENOUS | Status: AC
  Administered 2023-06-19: 0.25 mg via INTRAVENOUS
  Filled 2023-06-19: qty 5

## 2023-06-19 MED ORDER — OXALIPLATIN CHEMO INJECTION 100 MG/20ML
83.0000 mg/m2 | Freq: Once | INTRAVENOUS | Status: AC
  Administered 2023-06-19: 180 mg via INTRAVENOUS
  Filled 2023-06-19: qty 36

## 2023-06-19 MED ORDER — SODIUM CHLORIDE 0.9 % IV SOLN
2400.0000 mg/m2 | INTRAVENOUS | Status: DC
  Administered 2023-06-19: 5000 mg via INTRAVENOUS
  Filled 2023-06-19: qty 100

## 2023-06-19 MED ORDER — SODIUM CHLORIDE 0.9 % IV SOLN
400.0000 mg/m2 | Freq: Once | INTRAVENOUS | Status: AC
  Administered 2023-06-19: 856 mg via INTRAVENOUS
  Filled 2023-06-19: qty 42.8

## 2023-06-19 MED ORDER — SODIUM CHLORIDE 0.9% FLUSH
10.0000 mL | INTRAVENOUS | Status: DC | PRN
Start: 2023-06-19 — End: 2023-06-19

## 2023-06-19 MED ORDER — ATROPINE SULFATE 1 MG/ML IV SOLN
0.5000 mg | Freq: Once | INTRAVENOUS | Status: AC | PRN
  Administered 2023-06-19: 0.5 mg via INTRAVENOUS
  Filled 2023-06-19: qty 1

## 2023-06-19 MED ORDER — SODIUM CHLORIDE 0.9 % IV SOLN
150.0000 mg/m2 | Freq: Once | INTRAVENOUS | Status: AC
  Administered 2023-06-19: 320 mg via INTRAVENOUS
  Filled 2023-06-19: qty 6

## 2023-06-19 MED ORDER — PROCHLORPERAZINE MALEATE 10 MG PO TABS: 10.0000 mg | ORAL_TABLET | Freq: Four times a day (QID) | ORAL | 3 refills | Status: AC | PRN

## 2023-06-19 MED ORDER — ONDANSETRON HCL 8 MG PO TABS: 8.0000 mg | ORAL_TABLET | Freq: Three times a day (TID) | ORAL | 3 refills | Status: DC | PRN

## 2023-06-19 NOTE — Progress Notes (Unsigned)
 Patient seen by Lonna Cobb NP today  Vitals are within treatment parameters:Yes   Labs are within treatment parameters: Yes   Treatment plan has been signed: Yes   Per physician team, Patient is ready for treatment and there are NO modifications to the treatment plan.

## 2023-06-19 NOTE — Progress Notes (Signed)
  [facility] OFFICE PROGRESS NOTE   Diagnosis: Pancreas cancer  INTERVAL HISTORY:   Debbie [name] returns as scheduled.  She completed cycle 4 FOLFIRINOX 06/05/2023.  She had nausea beginning day 1.  No vomiting.  The nausea has persisted, mild.  She had a single mouth sore which has resolved.  She began having diarrhea on day 2.  The diarrhea lasted about a week.  At the most she had 6 loose stools per day.  She took a single dose of Imodium.  She has mild persistent cold sensitivity left thumb.  She has intermittent "back and side pain".  The pain is typically relieved with Tylenol.  Objective:  Vital signs in last 24 hours:  Blood pressure 124/82, pulse 74, temperature 98.1 F (36.7 C), temperature source Temporal, resp. rate 17, weight 229 lb 11.2 oz (104.2 kg), SpO2 97%.    HEENT: No thrush or ulcers. Resp: Lungs clear bilaterally. Cardio: Regular rate and rhythm. GI: Abdomen is soft.  Tender at the right upper abdomen.  No hepatomegaly. Vascular: No leg edema. Neuro: Vibratory sense minimally decreased to intact over the fingertips for tuning fork exam. Skin: Palms without erythema. Port-A-Cath without erythema.  Lab Results:  Lab Results  Component Value Date   WBC 8.6 06/19/2023   HGB 9.7 (L) 06/19/2023   HCT 29.0 (L) 06/19/2023   MCV 96.7 06/19/2023   PLT 120 (L) 06/19/2023   NEUTROABS 4.9 06/19/2023    Imaging:  No results found.  Medications: I have reviewed the patient's current medications.  Assessment/Plan: Pancreas cancer CT abdomen/pelvis 03/24/2023: 3 x 4.6 cm pancreas tail mass, multiple liver lesions consistent with metastases, fat stranding surrounding the second and third ports of the duodenum and pancreas head/uncinate  CT chest 03/27/2023: New spiculated nodular density in the right lower lobe, 8 x 4 mm EUS 03/27/2023: Pancreas tail mass, liver lesions, T2 N0 M1, needle biopsy of pancreas mass, adenocarcinoma Foundation 1: K-ras G 12R, HRD  signature negative, MSS, tumor mutation burden 0, PD-L1 TPS 1% Elevated CA 19-9 (121,720 on 03/26/2023) Cycle 1 FOLFIRINOX 04/10/2023 Chemotherapy held 04/24/2023 due to neutropenia Cycle 2 FOLFIRINOX 05/04/2023, Udenyca Cycle 3 FOLFIRINOX 05/22/2023, Udenyca Cycle 4 FOLFIRINOX 06/05/2023, Udenyca Cycle 5 FOLFIRINOX 06/19/2023, Udenyca   2.  Diabetes 3.  Acute abdominal pain and nausea/vomiting 03/23/2023 secondary to pancreatitis.  Improved 03/31/2023 4.  Hypertension. 5.  Asthma 6.  Hypothyroidism 7.  Hyperlipidemia 8.  Acute pancreatitis 03/24/2023  Disposition: Debbie [name] appears stable.  She has completed 4 cycles of FOLFIRINOX.  She had persistent nausea following treatment, also diarrhea.  For the nausea she will begin dexamethasone 4 mg twice daily day 2, continue for 3 days.  She understands to monitor blood sugars closely.  She has sliding scale insulin if needed.  She will take Compazine and Zofran as needed.  For the diarrhea she was provided with dosing instructions for Imodium.  She understands to contact the office with poorly controlled symptoms.  Plan to proceed with cycle 5 FOLFIRINOX today as scheduled.  Restaging CTs prior to next office visit.  CBC and chemistry panel reviewed.  Labs adequate to proceed with treatment as above.  AST/ALT mildly elevated.  Continue to monitor.  She will return for follow-up and treatment in 2 weeks.  We are available to see her sooner if needed.    Lonna Cobb ANP/GNP-BC   06/19/2023  8:53 AM

## 2023-06-19 NOTE — Patient Instructions (Signed)
 [facility]  Discharge Instructions: Thank you for choosing [facility] to provide your oncology and hematology care.   If you have a lab appointment with the Cancer Center, please go directly to the Cancer Center and check in at the registration area.   Wear comfortable clothing and clothing appropriate for easy access to any Portacath or PICC line.   We strive to give you quality time with your provider. You may need to reschedule your appointment if you arrive late (15 or more minutes).  Arriving late affects you and other patients whose appointments are after yours.  Also, if you miss three or more appointments without notifying the office, you may be dismissed from the clinic at the provider's discretion.      For prescription refill requests, have your pharmacy contact our office and allow 72 hours for refills to be completed.    Today you received the following chemotherapy and/or immunotherapy agents: Oxaliplatin, Irinotecan, Leucovorin, and Fluorouracil.   To help prevent nausea and vomiting after your treatment, we encourage you to take your nausea medication as directed.  BELOW ARE SYMPTOMS THAT SHOULD BE REPORTED IMMEDIATELY: *FEVER GREATER THAN 100.4 F (38 C) OR HIGHER *CHILLS OR SWEATING *NAUSEA AND VOMITING THAT IS NOT CONTROLLED WITH YOUR NAUSEA MEDICATION *UNUSUAL SHORTNESS OF BREATH *UNUSUAL BRUISING OR BLEEDING *URINARY PROBLEMS (pain or burning when urinating, or frequent urination) *BOWEL PROBLEMS (unusual diarrhea, constipation, pain near the anus) TENDERNESS IN MOUTH AND THROAT WITH OR WITHOUT PRESENCE OF ULCERS (sore throat, sores in mouth, or a toothache) UNUSUAL RASH, SWELLING OR PAIN  UNUSUAL VAGINAL DISCHARGE OR ITCHING   Items with * indicate a potential emergency and should be followed up as soon as possible or go to the Emergency Department if any problems should occur.  Please show the  CHEMOTHERAPY ALERT CARD or IMMUNOTHERAPY ALERT CARD at check-in to the Emergency Department and triage nurse.  Should you have questions after your visit or need to cancel or reschedule your appointment, please contact [facility]  Dept: [phone]  and follow the prompts.  Office hours are 8:00 a.m. to 4:30 p.m. Monday - Friday. Please note that voicemails left after 4:00 p.m. may not be returned until the following business day.  We are closed weekends and major holidays. You have access to a nurse at all times for urgent questions. Please call the main number to the clinic Dept: [phone] and follow the prompts.   For any non-urgent questions, you may also contact your provider using MyChart. We now offer e-Visits for anyone 11 and older to request care online for non-urgent symptoms. For details visit [email or web address].   Also download the MyChart app! Go to the app store, search "MyChart", open the app, select [place], and log in with your MyChart username and password.  The chemotherapy medication bag should finish at 46 hours, 96 hours, or 7 days. For example, if your pump is scheduled for 46 hours and it was put on at 4:00 p.m., it should finish at 2:00 p.m. the day it is scheduled to come off regardless of your appointment time.     Estimated time to finish at:   If the display on your pump reads "Low Volume" and it is beeping, take the batteries out of the pump and come to the cancer center for it to be taken off.  If the pump alarms go off prior to the pump reading "Low Volume" then call [phone] and someone can assist you.  If the plunger comes out and the chemotherapy medication is leaking out, please use your home chemo spill kit to clean up the spill. Do NOT use paper towels or other household products.  If you have problems or questions regarding your pump, please call either [phone] (24 hours a  day) or the cancer center Monday-Friday 8:00 a.m.- 4:30 p.m. at the clinic number and we will assist you. If you are unable to get assistance, then go to the nearest Emergency The chemotherapy medication bag should finish at 46 hours, 96 hours, or 7 days. For example, if your pump is scheduled for 46 hours and it was put on at 4:00 p.m., it should finish at 2:00 p.m. the day it is scheduled to come off regardless of your appointment time.     Estimated time to finish at:   If the display on your pump reads "Low Volume" and it is beeping, take the batteries out of the pump and come to the cancer center for it to be taken off.   If the pump alarms go off prior to the pump reading "Low Volume" then call [phone] and someone can assist you.  If the plunger comes out and the chemotherapy medication is leaking out, please use your home chemo spill kit to clean up the spill. Do NOT use paper towels or other household products.  If you have problems or questions regarding your pump, please call either [phone] (24 hours a day) or the cancer center Monday-Friday 8:00 a.m.- 4:30 p.m. at the clinic number and we will assist you. If you are unable to get assistance, then go to the nearest Emergency Department and ask the staff to contact the IV team for assistance.  Department and ask the staff to contact the IV team for assistance.

## 2023-06-21 ENCOUNTER — Telehealth: Payer: Self-pay

## 2023-06-21 ENCOUNTER — Inpatient Hospital Stay: Attending: Nurse Practitioner

## 2023-06-21 DIAGNOSIS — Z5111 Encounter for antineoplastic chemotherapy: Secondary | ICD-10-CM | POA: Diagnosis present

## 2023-06-21 DIAGNOSIS — Z79631 Long term (current) use of antimetabolite agent: Secondary | ICD-10-CM | POA: Diagnosis not present

## 2023-06-21 DIAGNOSIS — C252 Malignant neoplasm of tail of pancreas: Secondary | ICD-10-CM | POA: Diagnosis present

## 2023-06-21 DIAGNOSIS — Z79634 Long term (current) use of topoisomerase inhibitor: Secondary | ICD-10-CM | POA: Diagnosis not present

## 2023-06-21 DIAGNOSIS — Z5189 Encounter for other specified aftercare: Secondary | ICD-10-CM | POA: Diagnosis not present

## 2023-06-21 DIAGNOSIS — C787 Secondary malignant neoplasm of liver and intrahepatic bile duct: Secondary | ICD-10-CM | POA: Insufficient documentation

## 2023-06-21 DIAGNOSIS — C259 Malignant neoplasm of pancreas, unspecified: Secondary | ICD-10-CM

## 2023-06-21 DIAGNOSIS — Z79630 Long term (current) use of alkylating agent: Secondary | ICD-10-CM | POA: Insufficient documentation

## 2023-06-21 LAB — CANCER ANTIGEN 19-9: CA 19-9: 159308 U/mL — ABNORMAL HIGH (ref 0–35)

## 2023-06-21 MED ORDER — PEGFILGRASTIM-CBQV 6 MG/0.6ML ~~LOC~~ SOSY
6.0000 mg | PREFILLED_SYRINGE | Freq: Once | SUBCUTANEOUS | Status: AC
  Administered 2023-06-21: 6 mg via SUBCUTANEOUS
  Filled 2023-06-21: qty 0.6

## 2023-06-21 MED ORDER — HEPARIN SOD (PORK) LOCK FLUSH 100 UNIT/ML IV SOLN
500.0000 [IU] | Freq: Once | INTRAVENOUS | Status: AC | PRN
Start: 2023-06-21 — End: 2023-06-21
  Administered 2023-06-21: 500 [IU]

## 2023-06-21 MED ORDER — SODIUM CHLORIDE 0.9% FLUSH
10.0000 mL | INTRAVENOUS | Status: DC | PRN
  Administered 2023-06-21: 10 mL

## 2023-06-21 NOTE — Telephone Encounter (Signed)
 Patient gave verbal understanding and had no further questions or concerns

## 2023-06-21 NOTE — Telephone Encounter (Signed)
-----   Message from Lonna Cobb sent at 06/21/2023  2:32 PM EDT ----- Please let her know the CA19.9 tumor marker is lower.  Follow-up as scheduled.

## 2023-06-22 ENCOUNTER — Other Ambulatory Visit: Payer: Self-pay | Admitting: *Deleted

## 2023-06-22 ENCOUNTER — Encounter: Payer: Self-pay | Admitting: Nurse Practitioner

## 2023-06-22 MED ORDER — PANTOPRAZOLE SODIUM 40 MG PO TBEC: 40.0000 mg | DELAYED_RELEASE_TABLET | Freq: Two times a day (BID) | ORAL | 1 refills | Status: DC

## 2023-06-22 NOTE — Progress Notes (Signed)
 MyChart message from patient requesting refill on pantoprazole #90 day supply.

## 2023-06-26 ENCOUNTER — Ambulatory Visit (HOSPITAL_BASED_OUTPATIENT_CLINIC_OR_DEPARTMENT_OTHER)
Admission: RE | Admit: 2023-06-26 | Discharge: 2023-06-26 | Disposition: A | Discharge status: Home / Self Care | Source: Ambulatory Visit | Attending: Nurse Practitioner | Admitting: Nurse Practitioner

## 2023-06-26 DIAGNOSIS — C259 Malignant neoplasm of pancreas, unspecified: Secondary | ICD-10-CM | POA: Diagnosis present

## 2023-06-26 MED ORDER — IOHEXOL 300 MG/ML  SOLN
100.0000 mL | Freq: Once | INTRAMUSCULAR | Status: AC | PRN
  Administered 2023-06-26: 100 mL via INTRAVENOUS

## 2023-07-02 ENCOUNTER — Other Ambulatory Visit: Payer: Self-pay | Admitting: Oncology

## 2023-07-04 ENCOUNTER — Inpatient Hospital Stay

## 2023-07-04 ENCOUNTER — Inpatient Hospital Stay (HOSPITAL_BASED_OUTPATIENT_CLINIC_OR_DEPARTMENT_OTHER): Admitting: Oncology

## 2023-07-04 VITALS — BP 137/87 | HR 82 | Temp 98.1°F | Resp 18 | Ht 63.0 in | Wt 228.6 lb

## 2023-07-04 VITALS — BP 134/84 | HR 72 | Temp 98.4°F | Resp 16

## 2023-07-04 DIAGNOSIS — C259 Malignant neoplasm of pancreas, unspecified: Secondary | ICD-10-CM

## 2023-07-04 DIAGNOSIS — Z5111 Encounter for antineoplastic chemotherapy: Secondary | ICD-10-CM | POA: Diagnosis not present

## 2023-07-04 LAB — CMP (CANCER CENTER ONLY)
ALT: 59 U/L — ABNORMAL HIGH (ref 0–44)
AST: 57 U/L — ABNORMAL HIGH (ref 15–41)
Albumin: 4.2 g/dL (ref 3.5–5.0)
Alkaline Phosphatase: 195 U/L — ABNORMAL HIGH (ref 38–126)
Anion gap: 11 (ref 5–15)
BUN: 12 mg/dL (ref 6–20)
CO2: 28 mmol/L (ref 22–32)
Calcium: 9.8 mg/dL (ref 8.9–10.3)
Chloride: 102 mmol/L (ref 98–111)
Creatinine: 0.58 mg/dL (ref 0.44–1.00)
GFR, Estimated: >60 mL/min (ref >60)
Glucose, Bld: 130 mg/dL — ABNORMAL HIGH (ref 70–99)
Potassium: 3.3 mmol/L — ABNORMAL LOW (ref 3.5–5.1)
Sodium: 141 mmol/L (ref 135–145)
Total Bilirubin: 0.4 mg/dL (ref 0.0–1.2)
Total Protein: 7.2 g/dL (ref 6.5–8.1)

## 2023-07-04 LAB — CBC WITH DIFFERENTIAL (CANCER CENTER ONLY)
Abs Immature Granulocytes: 0.73 10*3/uL — ABNORMAL HIGH (ref 0.00–0.07)
Basophils Absolute: 0 10*3/uL (ref 0.0–0.1)
Basophils Relative: 0 %
Eosinophils Absolute: 0.1 10*3/uL (ref 0.0–0.5)
Eosinophils Relative: 2 %
HCT: 28.5 % — ABNORMAL LOW (ref 36.0–46.0)
Hemoglobin: 9.4 g/dL — ABNORMAL LOW (ref 12.0–15.0)
Immature Granulocytes: 8 %
Lymphocytes Relative: 20 %
Lymphs Abs: 1.9 10*3/uL (ref 0.7–4.0)
MCH: 32.3 pg (ref 26.0–34.0)
MCHC: 33 g/dL (ref 30.0–36.0)
MCV: 97.9 fL (ref 80.0–100.0)
Monocytes Absolute: 1 10*3/uL (ref 0.1–1.0)
Monocytes Relative: 11 %
Neutro Abs: 5.7 10*3/uL (ref 1.7–7.7)
Neutrophils Relative %: 59 %
Platelet Count: 122 10*3/uL — ABNORMAL LOW (ref 150–400)
RBC: 2.91 MIL/uL — ABNORMAL LOW (ref 3.87–5.11)
RDW: 20.6 % — ABNORMAL HIGH (ref 11.5–15.5)
WBC Count: 9.5 10*3/uL (ref 4.0–10.5)
nRBC: 0.6 % — ABNORMAL HIGH (ref 0.0–0.2)

## 2023-07-04 LAB — MAGNESIUM: Magnesium: 1.4 mg/dL — ABNORMAL LOW (ref 1.7–2.4)

## 2023-07-04 LAB — HCG, SERUM, QUALITATIVE: Preg, Serum: NEGATIVE

## 2023-07-04 MED ORDER — SODIUM CHLORIDE 0.9 % IV SOLN
400.0000 mg/m2 | Freq: Once | INTRAVENOUS | Status: AC
  Administered 2023-07-04: 856 mg via INTRAVENOUS
  Filled 2023-07-04: qty 42.8

## 2023-07-04 MED ORDER — SODIUM CHLORIDE 0.9 % IV SOLN
150.0000 mg/m2 | Freq: Once | INTRAVENOUS | Status: AC
  Administered 2023-07-04: 320 mg via INTRAVENOUS
  Filled 2023-07-04: qty 10

## 2023-07-04 MED ORDER — SODIUM CHLORIDE 0.9 % IV SOLN: INTRAVENOUS | Status: DC

## 2023-07-04 MED ORDER — FOSAPREPITANT DIMEGLUMINE INJECTION 150 MG
150.0000 mg | Freq: Once | INTRAVENOUS | Status: AC
  Administered 2023-07-04: 150 mg via INTRAVENOUS
  Filled 2023-07-04: qty 150

## 2023-07-04 MED ORDER — DEXTROSE 5 % IV SOLN
83.0000 mg/m2 | Freq: Once | INTRAVENOUS | Status: AC
  Administered 2023-07-04: 180 mg via INTRAVENOUS
  Filled 2023-07-04: qty 36

## 2023-07-04 MED ORDER — DEXAMETHASONE SODIUM PHOSPHATE 10 MG/ML IJ SOLN
10.0000 mg | Freq: Once | INTRAMUSCULAR | Status: AC
  Administered 2023-07-04: 10 mg via INTRAVENOUS
  Filled 2023-07-04: qty 1

## 2023-07-04 MED ORDER — DEXTROSE 5 % IV SOLN: INTRAVENOUS | Status: DC

## 2023-07-04 MED ORDER — SODIUM CHLORIDE 0.9 % IV SOLN
2400.0000 mg/m2 | INTRAVENOUS | Status: DC
  Administered 2023-07-04: 5000 mg via INTRAVENOUS
  Filled 2023-07-04: qty 100

## 2023-07-04 MED ORDER — PALONOSETRON HCL INJECTION 0.25 MG/5ML
0.2500 mg | Freq: Once | INTRAVENOUS | Status: AC
Start: 2023-07-04 — End: 2023-07-04
  Administered 2023-07-04: 0.25 mg via INTRAVENOUS
  Filled 2023-07-04: qty 5

## 2023-07-04 MED ORDER — ATROPINE SULFATE 1 MG/ML IV SOLN
0.5000 mg | Freq: Once | INTRAVENOUS | Status: AC | PRN
  Administered 2023-07-04: 0.5 mg via INTRAVENOUS
  Filled 2023-07-04: qty 1

## 2023-07-04 MED ORDER — MAGNESIUM SULFATE 4 GM/100ML IV SOLN
4.0000 g | Freq: Once | INTRAVENOUS | Status: AC
  Administered 2023-07-04: 4 g via INTRAVENOUS
  Filled 2023-07-04: qty 100

## 2023-07-04 NOTE — Progress Notes (Signed)
Treatment given per orders. Patient tolerated it well without problems. Vitals stable and discharged home from clinic ambulatory. Follow up as scheduled.  

## 2023-07-04 NOTE — Progress Notes (Signed)
 Patient seen by Dr. Coni Deep today  Vitals are within treatment parameters:Yes   Labs are within treatment parameters: Yes OK to proceed w/K+ 3.3  Treatment plan has been signed: Yes   Per physician team, Patient is ready for treatment and there are NO modifications to the treatment plan.

## 2023-07-04 NOTE — Progress Notes (Signed)
 [facility] OFFICE PROGRESS NOTE   Diagnosis: Pancreas cancer  INTERVAL HISTORY:   Ms. Sohm completed another cycle of FOLFIRINOX on 06/19/2023.  No mouth sores.  She reports diarrhea for 5 days following chemotherapy.  The diarrhea is relieved with Imodium.  She had cold sensitivity for 1 week following chemotherapy.  No neuropathy symptoms at present.  Nausea following chemotherapy is relieved with Zofran. She has mild abdominal discomfort.  This remains improved as compared to when she was diagnosed in January.  Objective:  Vital signs in last 24 hours:  Blood pressure 137/87, pulse 82, temperature 98.1 F (36.7 C), temperature source Temporal, resp. rate 18, height 5\' 3"  (1.6 m), weight 228 lb 9.6 oz (103.7 kg), SpO2 98%.    HEENT: No thrush or ulcers Lymphatics: No cervical, supraclavicular, or axillary nodes Resp: Lungs clear bilaterally Cardio: Regular rate and rhythm GI: No hepatosplenomegaly, no mass, mild tenderness throughout the upper abdomen Vascular: No leg edema Neuro: Mild loss of vibratory sense at the left greater than right fingertips    Portacath/PICC-without erythema  Lab Results:  Lab Results  Component Value Date   WBC 9.5 07/04/2023   HGB 9.4 (L) 07/04/2023   HCT 28.5 (L) 07/04/2023   MCV 97.9 07/04/2023   PLT 122 (L) 07/04/2023   NEUTROABS PENDING 07/04/2023    CMP  Lab Results  Component Value Date   NA 140 06/19/2023   K 3.9 06/19/2023   CL 103 06/19/2023   CO2 26 06/19/2023   GLUCOSE 162 (H) 06/19/2023   BUN 11 06/19/2023   CREATININE 0.61 06/19/2023   CALCIUM 9.6 06/19/2023   PROT 7.1 06/19/2023   ALBUMIN 4.4 06/19/2023   AST 59 (H) 06/19/2023   ALT 53 (H) 06/19/2023   ALKPHOS 158 (H) 06/19/2023   BILITOT 0.4 06/19/2023   GFRNONAA >60 06/19/2023   GFRAA >60 10/03/2015    Lab Results  Component Value Date   [id number] 159,308 (H) 06/19/2023    Medications: I have reviewed the patient's current  medications.   Assessment/Plan: Pancreas cancer CT abdomen/pelvis 03/24/2023: 3 x 4.6 cm pancreas tail mass, multiple liver lesions consistent with metastases, fat stranding surrounding the second and third ports of the duodenum and pancreas head/uncinate  CT chest 03/27/2023: New spiculated nodular density in the right lower lobe, 8 x 4 mm EUS 03/27/2023: Pancreas tail mass, liver lesions, T2 N0 M1, needle biopsy of pancreas mass, adenocarcinoma Foundation 1: K-ras G 12R, HRD signature negative, MSS, tumor mutation burden 0, PD-L1 TPS 1% Elevated CA 19-9 (121,720 on 03/26/2023) Cycle 1 FOLFIRINOX 04/10/2023 Chemotherapy held 04/24/2023 due to neutropenia Cycle 2 FOLFIRINOX 05/04/2023, Udenyca Cycle 3 FOLFIRINOX 05/22/2023, Udenyca Cycle 4 FOLFIRINOX 06/05/2023, Udenyca Cycle 5 FOLFIRINOX 06/19/2023, Udenyca CTs 06/26/2023: Stable pancreas mass, slight increase in size and number of hepatic metastases, improved ill-defined right lower lobe nodule, new nonspecific small groundglass nodules, improved stranding at the pancreas head/neck (review of CTs with patient-pancreas mass appears slightly smaller, several liver lesions are slightly larger others measured stable or slightly smaller Cycle 6 FOLFIRINOX 07/04/2023, Udenyca   2.  Diabetes 3.  Acute abdominal pain and nausea/vomiting 03/23/2023 secondary to pancreatitis.  Improved 03/31/2023 4.  Hypertension. 5.  Asthma 6.  Hypothyroidism 7.  Hyperlipidemia 8.  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 Isip has completed 5 cycles of FOLFIRINOX.  Her clinical status is stable.  She has tolerated the chemotherapy well.  I reviewed the restaging CT  findings and images with Ms. Arlena Lacrosse.  There is slight progression of several liver lesions, others appear stable.  The CA 19-9 was lower 2 weeks ago.  The abdominal pain remains improved and the pancreas mass is stable to slightly smaller.  We discussed continuing  FOLFIRINOX versus switching to a different systemic therapy regimen.  The plan is to continue FOLFIRINOX with a restaging evaluation after 3 cycles.  We will check the CA 19-9 with each cycle.  She will be monitored for progression of neuropathy symptoms.  Coni Deep, MD  07/04/2023  8:24 AM

## 2023-07-04 NOTE — Patient Instructions (Signed)
 [facility]   Discharge Instructions: The chemotherapy medication bag should finish at 46 hours, 96 hours, or 7 days. For example, if your pump is scheduled for 46 hours and it was put on at 4:00 p.m., it should finish at 2:00 p.m. the day it is scheduled to come off regardless of your appointment time.     Estimated time to finish at [facility], July 06, 2023.   If the display on your pump reads "Low Volume" and it is beeping, take the batteries out of the pump and come to the cancer center for it to be taken off.   If the pump alarms go off prior to the pump reading "Low Volume" then call [phone] and someone can assist you.  If the plunger comes out and the chemotherapy medication is leaking out, please use your home chemo spill kit to clean up the spill. Do NOT use paper towels or other household products.  If you have problems or questions regarding your pump, please call either [phone] (24 hours a day) or the cancer center Monday-Friday 8:00 a.m.- 4:30 p.m. at the clinic number and we will assist you. If you are unable to get assistance, then go to the nearest Emergency Department and ask the staff to contact the IV team for assistance.   Thank you for choosing [facility] to provide your oncology and hematology care.   If you have a lab appointment with the Cancer Center, please go directly to the Cancer Center and check in at the registration area.   Wear comfortable clothing and clothing appropriate for easy access to any Portacath or PICC line.   We strive to give you quality time with your provider. You may need to reschedule your appointment if you arrive late (15 or more minutes).  Arriving late affects you and other patients whose appointments are after yours.  Also, if you miss three or more appointments without notifying the office, you may be dismissed from the clinic at the provider's discretion.       For prescription refill requests, have your pharmacy contact our office and allow 72 hours for refills to be completed.    Today you received the following chemotherapy and/or immunotherapy agents OXALIPLATIN/LEUCOVORIN/IRINOTECAN/FLUOROURACIL      To help prevent nausea and vomiting after your treatment, we encourage you to take your nausea medication as directed.  BELOW ARE SYMPTOMS THAT SHOULD BE REPORTED IMMEDIATELY: *FEVER GREATER THAN 100.4 F (38 C) OR HIGHER *CHILLS OR SWEATING *NAUSEA AND VOMITING THAT IS NOT CONTROLLED WITH YOUR NAUSEA MEDICATION *UNUSUAL SHORTNESS OF BREATH *UNUSUAL BRUISING OR BLEEDING *URINARY PROBLEMS (pain or burning when urinating, or frequent urination) *BOWEL PROBLEMS (unusual diarrhea, constipation, pain near the anus) TENDERNESS IN MOUTH AND THROAT WITH OR WITHOUT PRESENCE OF ULCERS (sore throat, sores in mouth, or a toothache) UNUSUAL RASH, SWELLING OR PAIN  UNUSUAL VAGINAL DISCHARGE OR ITCHING   Items with * indicate a potential emergency and should be followed up as soon as possible or go to the Emergency Department if any problems should occur.  Please show the CHEMOTHERAPY ALERT CARD or IMMUNOTHERAPY ALERT CARD at check-in to the Emergency Department and triage nurse.  Should you have questions after your visit or need to cancel or reschedule your appointment, please contact [facility]  Dept: [phone]  and follow the prompts.  Office hours are  8:00 a.m. to 4:30 p.m. Monday - Friday. Please note that voicemails left after 4:00 p.m. may not be returned until the following business day.  We are closed weekends and major holidays. You have access to a nurse at all times for urgent questions. Please call the main number to the clinic Dept: [phone] and follow the prompts.   For any non-urgent questions, you may also contact your provider using MyChart. We now offer e-Visits for anyone  21 and older to request care online for non-urgent symptoms. For details visit [email or web address].   Also download the MyChart app! Go to the app store, search "MyChart", open the app, select [place], and log in with your MyChart username and password.  Oxaliplatin Injection What is this medication? OXALIPLATIN (ox AL i PLA tin) treats colorectal cancer. It works by slowing down the growth of cancer cells. This medicine may be used for other purposes; ask your health care provider or pharmacist if you have questions. COMMON BRAND NAME(S): Eloxatin What should I tell my care team before I take this medication? They need to know if you have any of these conditions: Heart disease History of irregular heartbeat or rhythm Liver disease Low blood cell levels (white cells, red cells, and platelets) Lung or breathing disease, such as asthma Take medications that treat or prevent blood clots Tingling of the fingers, toes, or other nerve disorder An unusual or allergic reaction to oxaliplatin, other medications, foods, dyes, or preservatives If you or your partner are pregnant or trying to get pregnant Breast-feeding How should I use this medication? This medication is injected into a vein. It is given by your care team in a hospital or clinic setting. Talk to your care team about the use of this medication in children. Special care may be needed. Overdosage: If you think you have taken too much of this medicine contact a poison control center or emergency room at once. NOTE: This medicine is only for you. Do not share this medicine with others. What if I miss a dose? Keep appointments for follow-up doses. It is important not to miss a dose. Call your care team if you are unable to keep an appointment. What may interact with this medication? Do not take this medication with any of the following: Cisapride Dronedarone Pimozide Thioridazine This medication may also interact with the  following: Aspirin and aspirin-like medications Certain medications that treat or prevent blood clots, such as warfarin, apixaban, dabigatran, and rivaroxaban Cisplatin Cyclosporine Diuretics Medications for infection, such as acyclovir, adefovir, amphotericin B, bacitracin, cidofovir, foscarnet, ganciclovir, gentamicin, pentamidine, vancomycin NSAIDs, medications for pain and inflammation, such as ibuprofen or naproxen Other medications that cause heart rhythm changes Pamidronate Zoledronic acid This list may not describe all possible interactions. Give your health care provider a list of all the medicines, herbs, non-prescription drugs, or dietary supplements you use. Also tell them if you smoke, drink alcohol, or use illegal drugs. Some items may interact with your medicine. What should I watch for while using this medication? Your condition will be monitored carefully while you are receiving this medication. You may need blood work while taking this medication. This medication may make you feel generally unwell. This is not uncommon as chemotherapy can affect healthy cells as well as cancer cells. Report any side effects. Continue your course of treatment even though you feel ill unless your care team tells you to stop. This medication may increase your risk of getting an infection. Call your care team for  advice if you get a fever, chills, sore throat, or other symptoms of a cold or flu. Do not treat yourself. Try to avoid being around people who are sick. Avoid taking medications that contain aspirin, acetaminophen, ibuprofen, naproxen, or ketoprofen unless instructed by your care team. These medications may hide a fever. Be careful brushing or flossing your teeth or using a toothpick because you may get an infection or bleed more easily. If you have any dental work done, tell your dentist you are receiving this medication. This medication can make you more sensitive to cold. Do not drink  cold drinks or use ice. Cover exposed skin before coming in contact with cold temperatures or cold objects. When out in cold weather wear warm clothing and cover your mouth and nose to warm the air that goes into your lungs. Tell your care team if you get sensitive to the cold. Talk to your care team if you or your partner are pregnant or think either of you might be pregnant. This medication can cause serious birth defects if taken during pregnancy and for 9 months after the last dose. A negative pregnancy test is required before starting this medication. A reliable form of contraception is recommended while taking this medication and for 9 months after the last dose. Talk to your care team about effective forms of contraception. Do not father a child while taking this medication and for 6 months after the last dose. Use a condom while having sex during this time period. Do not breastfeed while taking this medication and for 3 months after the last dose. This medication may cause infertility. Talk to your care team if you are concerned about your fertility. What side effects may I notice from receiving this medication? Side effects that you should report to your care team as soon as possible: Allergic reactions--skin rash, itching, hives, swelling of the face, lips, tongue, or throat Bleeding--bloody or black, tar-like stools, vomiting blood or brown material that looks like coffee grounds, red or dark brown urine, small red or purple spots on skin, unusual bruising or bleeding Dry cough, shortness of breath or trouble breathing Heart rhythm changes--fast or irregular heartbeat, dizziness, feeling faint or lightheaded, chest pain, trouble breathing Infection--fever, chills, cough, sore throat, wounds that don't heal, pain or trouble when passing urine, general feeling of discomfort or being unwell Liver injury--right upper belly pain, loss of appetite, nausea, light-colored stool, dark yellow or brown  urine, yellowing skin or eyes, unusual weakness or fatigue Low red blood cell level--unusual weakness or fatigue, dizziness, headache, trouble breathing Muscle injury--unusual weakness or fatigue, muscle pain, dark yellow or brown urine, decrease in amount of urine Pain, tingling, or numbness in the hands or feet Sudden and severe headache, confusion, change in vision, seizures, which may be signs of posterior reversible encephalopathy syndrome (PRES) Unusual bruising or bleeding Side effects that usually do not require medical attention (report to your care team if they continue or are bothersome): Diarrhea Nausea Pain, redness, or swelling with sores inside the mouth or throat Unusual weakness or fatigue Vomiting This list may not describe all possible side effects. Call your doctor for medical advice about side effects. You may report side effects to FDA at 1-800-FDA-1088. Where should I keep my medication? This medication is given in a hospital or clinic. It will not be stored at home. NOTE: This sheet is a summary. It may not cover all possible information. If you have questions about this medicine, talk to your  doctor, pharmacist, or health care provider.  2024 Elsevier/Gold Standard (2023-02-17 00:00:00)  Leucovorin Injection What is this medication? LEUCOVORIN (loo koe VOR in) prevents side effects from certain medications, such as methotrexate. It works by increasing folate levels. This helps protect healthy cells in your body. It may also be used to treat anemia caused by low levels of folate. It can also be used with fluorouracil, a type of chemotherapy, to treat colorectal cancer. It works by increasing the effects of fluorouracil in the body. This medicine may be used for other purposes; ask your health care provider or pharmacist if you have questions. What should I tell my care team before I take this medication? They need to know if you have any of these conditions: Anemia  from low levels of vitamin B12 in the blood An unusual or allergic reaction to leucovorin, folic acid, other medications, foods, dyes, or preservatives Pregnant or trying to get pregnant Breastfeeding How should I use this medication? This medication is injected into a vein or a muscle. It is given by your care team in a hospital or clinic setting. Talk to your care team about the use of this medication in children. Special care may be needed. Overdosage: If you think you have taken too much of this medicine contact a poison control center or emergency room at once. NOTE: This medicine is only for you. Do not share this medicine with others. What if I miss a dose? Keep appointments for follow-up doses. It is important not to miss your dose. Call your care team if you are unable to keep an appointment. What may interact with this medication? Capecitabine Fluorouracil Phenobarbital Phenytoin Primidone Trimethoprim;sulfamethoxazole This list may not describe all possible interactions. Give your health care provider a list of all the medicines, herbs, non-prescription drugs, or dietary supplements you use. Also tell them if you smoke, drink alcohol, or use illegal drugs. Some items may interact with your medicine. What should I watch for while using this medication? Your condition will be monitored carefully while you are receiving this medication. This medication may increase the side effects of 5-fluorouracil. Tell your care team if you have diarrhea or mouth sores that do not get better or that get worse. What side effects may I notice from receiving this medication? Side effects that you should report to your care team as soon as possible: Allergic reactions--skin rash, itching, hives, swelling of the face, lips, tongue, or throat This list may not describe all possible side effects. Call your doctor for medical advice about side effects. You may report side effects to FDA at  1-800-FDA-1088. Where should I keep my medication? This medication is given in a hospital or clinic. It will not be stored at home. NOTE: This sheet is a summary. It may not cover all possible information. If you have questions about this medicine, talk to your doctor, pharmacist, or health care provider.  2024 Elsevier/Gold Standard (2021-08-10 00:00:00)  Irinotecan Injection What is this medication? IRINOTECAN (ir in oh TEE kan) treats some types of cancer. It works by slowing down the growth of cancer cells. This medicine may be used for other purposes; ask your health care provider or pharmacist if you have questions. COMMON BRAND NAME(S): Camptosar What should I tell my care team before I take this medication? They need to know if you have any of these conditions: Dehydration Diarrhea Infection, especially a viral infection, such as chickenpox, cold sores, herpes Liver disease Low blood cell levels (white  cells, red cells, and platelets) Low levels of electrolytes, such as calcium, magnesium, or potassium in your blood Recent or ongoing radiation An unusual or allergic reaction to irinotecan, other medications, foods, dyes, or preservatives If you or your partner are pregnant or trying to get pregnant Breast-feeding How should I use this medication? This medication is injected into a vein. It is given by your care team in a hospital or clinic setting. Talk to your care team about the use of this medication in children. Special care may be needed. Overdosage: If you think you have taken too much of this medicine contact a poison control center or emergency room at once. NOTE: This medicine is only for you. Do not share this medicine with others. What if I miss a dose? Keep appointments for follow-up doses. It is important not to miss your dose. Call your care team if you are unable to keep an appointment. What may interact with this medication? Do not take this medication with any  of the following: Cobicistat Itraconazole This medication may also interact with the following: Certain antibiotics, such as clarithromycin, rifampin, rifabutin Certain antivirals for HIV or AIDS Certain medications for fungal infections, such as ketoconazole, posaconazole, voriconazole Certain medications for seizures, such as carbamazepine, phenobarbital, phenytoin Gemfibrozil Nefazodone St. John's wort This list may not describe all possible interactions. Give your health care provider a list of all the medicines, herbs, non-prescription drugs, or dietary supplements you use. Also tell them if you smoke, drink alcohol, or use illegal drugs. Some items may interact with your medicine. What should I watch for while using this medication? Your condition will be monitored carefully while you are receiving this medication. You may need blood work while taking this medication. This medication may make you feel generally unwell. This is not uncommon as chemotherapy can affect healthy cells as well as cancer cells. Report any side effects. Continue your course of treatment even though you feel ill unless your care team tells you to stop. This medication can cause serious side effects. To reduce the risk, your care team may give you other medications to take before receiving this one. Be sure to follow the directions from your care team. This medication may affect your coordination, reaction time, or judgement. Do not drive or operate machinery until you know how this medication affects you. Sit up or stand slowly to reduce the risk of dizzy or fainting spells. Drinking alcohol with this medication can increase the risk of these side effects. This medication may increase your risk of getting an infection. Call your care team for advice if you get a fever, chills, sore throat, or other symptoms of a cold or flu. Do not treat yourself. Try to avoid being around people who are sick. Avoid taking medications  that contain aspirin, acetaminophen, ibuprofen, naproxen, or ketoprofen unless instructed by your care team. These medications may hide a fever. This medication may increase your risk to bruise or bleed. Call your care team if you notice any unusual bleeding. Be careful brushing or flossing your teeth or using a toothpick because you may get an infection or bleed more easily. If you have any dental work done, tell your dentist you are receiving this medication. Talk to your care team if you or your partner are pregnant or think either of you might be pregnant. This medication can cause serious birth defects if taken during pregnancy and for 6 months after the last dose. You will need a negative pregnancy  test before starting this medication. Contraception is recommended while taking this medication and for 6 months after the last dose. Your care team can help you find the option that works for you. Do not father a child while taking this medication and for 3 months after the last dose. Use a condom for contraception during this time period. Do not breastfeed while taking this medication and for 7 days after the last dose. This medication may cause infertility. Talk to your care team if you are concerned about your fertility. What side effects may I notice from receiving this medication? Side effects that you should report to your care team as soon as possible: Allergic reactions--skin rash, itching, hives, swelling of the face, lips, tongue, or throat Dry cough, shortness of breath or trouble breathing Increased saliva or tears, increased sweating, stomach cramping, diarrhea, small pupils, unusual weakness or fatigue, slow heartbeat Infection--fever, chills, cough, sore throat, wounds that don't heal, pain or trouble when passing urine, general feeling of discomfort or being unwell Kidney injury--decrease in the amount of urine, swelling of the ankles, hands, or feet Low red blood cell level--unusual  weakness or fatigue, dizziness, headache, trouble breathing Severe or prolonged diarrhea Unusual bruising or bleeding Side effects that usually do not require medical attention (report to your care team if they continue or are bothersome): Constipation Diarrhea Hair loss Loss of appetite Nausea Stomach pain This list may not describe all possible side effects. Call your doctor for medical advice about side effects. You may report side effects to FDA at 1-800-FDA-1088. Where should I keep my medication? This medication is given in a hospital or clinic. It will not be stored at home. NOTE: This sheet is a summary. It may not cover all possible information. If you have questions about this medicine, talk to your doctor, pharmacist, or health care provider.  2024 Elsevier/Gold Standard (2021-07-19 00:00:00)  Fluorouracil Injection What is this medication? FLUOROURACIL (flure oh YOOR a sil) treats some types of cancer. It works by slowing down the growth of cancer cells. This medicine may be used for other purposes; ask your health care provider or pharmacist if you have questions. COMMON BRAND NAME(S): Adrucil What should I tell my care team before I take this medication? They need to know if you have any of these conditions: Blood disorders Dihydropyrimidine dehydrogenase (DPD) deficiency Infection, such as chickenpox, cold sores, herpes Kidney disease Liver disease Poor nutrition Recent or ongoing radiation therapy An unusual or allergic reaction to fluorouracil, other medications, foods, dyes, or preservatives If you or your partner are pregnant or trying to get pregnant Breast-feeding How should I use this medication? This medication is injected into a vein. It is administered by your care team in a hospital or clinic setting. Talk to your care team about the use of this medication in children. Special care may be needed. Overdosage: If you think you have taken too much of this  medicine contact a poison control center or emergency room at once. NOTE: This medicine is only for you. Do not share this medicine with others. What if I miss a dose? Keep appointments for follow-up doses. It is important not to miss your dose. Call your care team if you are unable to keep an appointment. What may interact with this medication? Do not take this medication with any of the following: Live virus vaccines This medication may also interact with the following: Medications that treat or prevent blood clots, such as warfarin, enoxaparin, dalteparin This  list may not describe all possible interactions. Give your health care provider a list of all the medicines, herbs, non-prescription drugs, or dietary supplements you use. Also tell them if you smoke, drink alcohol, or use illegal drugs. Some items may interact with your medicine. What should I watch for while using this medication? Your condition will be monitored carefully while you are receiving this medication. This medication may make you feel generally unwell. This is not uncommon as chemotherapy can affect healthy cells as well as cancer cells. Report any side effects. Continue your course of treatment even though you feel ill unless your care team tells you to stop. In some cases, you may be given additional medications to help with side effects. Follow all directions for their use. This medication may increase your risk of getting an infection. Call your care team for advice if you get a fever, chills, sore throat, or other symptoms of a cold or flu. Do not treat yourself. Try to avoid being around people who are sick. This medication may increase your risk to bruise or bleed. Call your care team if you notice any unusual bleeding. Be careful brushing or flossing your teeth or using a toothpick because you may get an infection or bleed more easily. If you have any dental work done, tell your dentist you are receiving this  medication. Avoid taking medications that contain aspirin, acetaminophen, ibuprofen, naproxen, or ketoprofen unless instructed by your care team. These medications may hide a fever. Do not treat diarrhea with over the counter products. Contact your care team if you have diarrhea that lasts more than 2 days or if it is severe and watery. This medication can make you more sensitive to the sun. Keep out of the sun. If you cannot avoid being in the sun, wear protective clothing and sunscreen. Do not use sun lamps, tanning beds, or tanning booths. Talk to your care team if you or your partner wish to become pregnant or think you might be pregnant. This medication can cause serious birth defects if taken during pregnancy and for 3 months after the last dose. A reliable form of contraception is recommended while taking this medication and for 3 months after the last dose. Talk to your care team about effective forms of contraception. Do not father a child while taking this medication and for 3 months after the last dose. Use a condom while having sex during this time period. Do not breastfeed while taking this medication. This medication may cause infertility. Talk to your care team if you are concerned about your fertility. What side effects may I notice from receiving this medication? Side effects that you should report to your care team as soon as possible: Allergic reactions--skin rash, itching, hives, swelling of the face, lips, tongue, or throat Heart attack--pain or tightness in the chest, shoulders, arms, or jaw, nausea, shortness of breath, cold or clammy skin, feeling faint or lightheaded Heart failure--shortness of breath, swelling of the ankles, feet, or hands, sudden weight gain, unusual weakness or fatigue Heart rhythm changes--fast or irregular heartbeat, dizziness, feeling faint or lightheaded, chest pain, trouble breathing High ammonia level--unusual weakness or fatigue, confusion, loss of  appetite, nausea, vomiting, seizures Infection--fever, chills, cough, sore throat, wounds that don't heal, pain or trouble when passing urine, general feeling of discomfort or being unwell Low red blood cell level--unusual weakness or fatigue, dizziness, headache, trouble breathing Pain, tingling, or numbness in the hands or feet, muscle weakness, change in vision, confusion or  trouble speaking, loss of balance or coordination, trouble walking, seizures Redness, swelling, and blistering of the skin over hands and feet Severe or prolonged diarrhea Unusual bruising or bleeding Side effects that usually do not require medical attention (report to your care team if they continue or are bothersome): Dry skin Headache Increased tears Nausea Pain, redness, or swelling with sores inside the mouth or throat Sensitivity to light Vomiting This list may not describe all possible side effects. Call your doctor for medical advice about side effects. You may report side effects to FDA at 1-800-FDA-1088. Where should I keep my medication? This medication is given in a hospital or clinic. It will not be stored at home. NOTE: This sheet is a summary. It may not cover all possible information. If you have questions about this medicine, talk to your doctor, pharmacist, or health care provider.  2024 Elsevier/Gold Standard (2021-07-13 00:00:00)

## 2023-07-05 LAB — CANCER ANTIGEN 19-9: CA 19-9: 101000 U/mL — ABNORMAL HIGH (ref 0–35)

## 2023-07-06 ENCOUNTER — Inpatient Hospital Stay

## 2023-07-06 VITALS — BP 117/73 | HR 64 | Temp 97.6°F | Resp 18

## 2023-07-06 DIAGNOSIS — Z5111 Encounter for antineoplastic chemotherapy: Secondary | ICD-10-CM | POA: Diagnosis not present

## 2023-07-06 DIAGNOSIS — C259 Malignant neoplasm of pancreas, unspecified: Secondary | ICD-10-CM

## 2023-07-06 MED ORDER — SODIUM CHLORIDE 0.9% FLUSH
10.0000 mL | INTRAVENOUS | Status: DC | PRN
  Administered 2023-07-06: 10 mL

## 2023-07-06 MED ORDER — HEPARIN SOD (PORK) LOCK FLUSH 100 UNIT/ML IV SOLN
500.0000 [IU] | Freq: Once | INTRAVENOUS | Status: AC | PRN
  Administered 2023-07-06: 500 [IU]

## 2023-07-06 MED ORDER — PEGFILGRASTIM-CBQV 6 MG/0.6ML ~~LOC~~ SOSY
6.0000 mg | PREFILLED_SYRINGE | Freq: Once | SUBCUTANEOUS | Status: AC
  Administered 2023-07-06: 6 mg via SUBCUTANEOUS
  Filled 2023-07-06: qty 0.6

## 2023-07-06 NOTE — Patient Instructions (Signed)

## 2023-07-11 ENCOUNTER — Other Ambulatory Visit: Payer: Self-pay | Admitting: Internal Medicine

## 2023-07-11 ENCOUNTER — Telehealth: Payer: Self-pay

## 2023-07-11 DIAGNOSIS — E119 Type 2 diabetes mellitus without complications: Secondary | ICD-10-CM

## 2023-07-11 MED ORDER — DEXCOM G7 SENSOR MISC: 1.0000 | Freq: Every day | 11 refills | Status: DC

## 2023-07-11 MED ORDER — DEXCOM G7 SENSOR MISC: 1.0000 | Freq: Every day | 11 refills | Status: AC

## 2023-07-11 NOTE — Addendum Note (Signed)
 Addended by: Fremon Zacharia G on: 07/11/2023 04:07 PM   Modules accepted: Orders

## 2023-07-11 NOTE — Telephone Encounter (Signed)
 Review if could be sent to [place]  [facility]  Copied from [employer] [id number]. Topic: Clinical - Medication Refill >> Jul 10, 2023  3:26 PM [name] wrote: Most Recent Primary Care Visit:  Provider: MORRISON, RYAN G  Department: [facility]  Visit Type: OFFICE VISIT  Date: 03/30/2023  Medication: Continuous Glucose Sensor (DEXCOM G7 SENSOR) (out of town and Only need one)  Has the patient contacted their pharmacy? Yes (Agent: If no, request that the patient contact the pharmacy for the refill. If patient does not wish to contact the pharmacy document the reason why and proceed with request.) (Agent: If yes, when and what did the pharmacy advise?)  Is this the correct pharmacy for this prescription? Yes If no, delete pharmacy and type the correct one.  This is the patient's preferred pharmacy:  [facility] [address] [place], [place] [address] Phone: [phone]    Has the prescription been filled recently? No  Is the patient out of the medication? Yes  Has the patient been seen for an appointment in the last year OR does the patient have an upcoming appointment? Yes  Can we respond through MyChart? Yes  Agent: Please be advised that Rx refills may take up to 3 business days. We ask that you follow-up with your pharmacy.

## 2023-07-12 NOTE — Telephone Encounter (Signed)
 Copied from CRM [id number]. Topic: Clinical - Medication Refill >> Jul 10, 2023  3:26 PM [name] wrote: Most Recent Primary Care Visit:  Provider: MORRISON, RYAN G  Department: [facility]  Visit Type: OFFICE VISIT  Date: 03/30/2023  Medication: Continuous Glucose Sensor (DEXCOM G7 SENSOR) (out of town and Only need one)  Has the patient contacted their pharmacy? Yes (Agent: If no, request that the patient contact the pharmacy for the refill. If patient does not wish to contact the pharmacy document the reason why and proceed with request.) (Agent: If yes, when and what did the pharmacy advise?)  Is this the correct pharmacy for this prescription? Yes If no, delete pharmacy and type the correct one.  This is the patient's preferred pharmacy:  [facility] [address] [place], [place] [address] Phone: [phone]    Has the prescription been filled recently? No  Is the patient out of the medication? Yes  Has the patient been seen for an appointment in the last year OR does the patient have an upcoming appointment? Yes  Can we respond through MyChart? Yes  Agent: Please be advised that Rx refills may take up to 3 business days. We ask that you follow-up with your pharmacy. >> Jul 11, 2023  1:44 PM Chuck Crater wrote: Patient called in again wanting an update on her Dexcom because she is out of town. Advised patient it was sent to her pcp and also advise patient of 3 day turn around time. Patient disconnected the line. >> Jul 11, 2023 10:29 AM Rosamond Comes wrote: Patient calling in asking about refill she had called in yesterday. Continuous Glucose Sensor (DEXCOM G7 SENSOR) (out of town and Only need one). Patient  last glucose reading was Saturday 07/08/23 and feels okay. Patient stated it is ok [phone]  Patient would like a call back regarding this issue

## 2023-07-12 NOTE — Telephone Encounter (Signed)
 Left patient a detailed voicemail that dexcom g7 sensor was sent in yesterday afternoon to [facility].

## 2023-07-16 ENCOUNTER — Other Ambulatory Visit: Payer: Self-pay | Admitting: Oncology

## 2023-07-17 ENCOUNTER — Encounter: Payer: Self-pay | Admitting: Nurse Practitioner

## 2023-07-17 ENCOUNTER — Inpatient Hospital Stay

## 2023-07-17 ENCOUNTER — Inpatient Hospital Stay (HOSPITAL_BASED_OUTPATIENT_CLINIC_OR_DEPARTMENT_OTHER): Admitting: Nurse Practitioner

## 2023-07-17 ENCOUNTER — Other Ambulatory Visit: Payer: Self-pay

## 2023-07-17 VITALS — BP 139/83 | HR 71

## 2023-07-17 VITALS — BP 134/85 | HR 81 | Temp 98.2°F | Resp 18 | Ht 63.0 in | Wt 225.7 lb

## 2023-07-17 DIAGNOSIS — C259 Malignant neoplasm of pancreas, unspecified: Secondary | ICD-10-CM

## 2023-07-17 DIAGNOSIS — Z5111 Encounter for antineoplastic chemotherapy: Secondary | ICD-10-CM | POA: Diagnosis not present

## 2023-07-17 LAB — CMP (CANCER CENTER ONLY)
ALT: 62 U/L — ABNORMAL HIGH (ref 0–44)
AST: 71 U/L — ABNORMAL HIGH (ref 15–41)
Albumin: 4.5 g/dL (ref 3.5–5.0)
Alkaline Phosphatase: 283 U/L — ABNORMAL HIGH (ref 38–126)
Anion gap: 14 (ref 5–15)
BUN: 6 mg/dL (ref 6–20)
CO2: 26 mmol/L (ref 22–32)
Calcium: 10.3 mg/dL (ref 8.9–10.3)
Chloride: 103 mmol/L (ref 98–111)
Creatinine: 0.67 mg/dL (ref 0.44–1.00)
GFR, Estimated: >60 mL/min (ref >60)
Glucose, Bld: 122 mg/dL — ABNORMAL HIGH (ref 70–99)
Potassium: 3.4 mmol/L — ABNORMAL LOW (ref 3.5–5.1)
Sodium: 142 mmol/L (ref 135–145)
Total Bilirubin: 0.2 mg/dL (ref 0.0–1.2)
Total Protein: 6.9 g/dL (ref 6.5–8.1)

## 2023-07-17 LAB — CBC WITH DIFFERENTIAL (CANCER CENTER ONLY)
Abs Immature Granulocytes: 0.18 10*3/uL — ABNORMAL HIGH (ref 0.00–0.07)
Basophils Absolute: 0 10*3/uL (ref 0.0–0.1)
Basophils Relative: 0 %
Eosinophils Absolute: 0.1 10*3/uL (ref 0.0–0.5)
Eosinophils Relative: 2 %
HCT: 26.8 % — ABNORMAL LOW (ref 36.0–46.0)
Hemoglobin: 9.1 g/dL — ABNORMAL LOW (ref 12.0–15.0)
Immature Granulocytes: 4 %
Lymphocytes Relative: 32 %
Lymphs Abs: 1.5 10*3/uL (ref 0.7–4.0)
MCH: 33.3 pg (ref 26.0–34.0)
MCHC: 34 g/dL (ref 30.0–36.0)
MCV: 98.2 fL (ref 80.0–100.0)
Monocytes Absolute: 0.7 10*3/uL (ref 0.1–1.0)
Monocytes Relative: 15 %
Neutro Abs: 2.3 10*3/uL (ref 1.7–7.7)
Neutrophils Relative %: 47 %
Platelet Count: 100 10*3/uL — ABNORMAL LOW (ref 150–400)
RBC: 2.73 MIL/uL — ABNORMAL LOW (ref 3.87–5.11)
RDW: 21 % — ABNORMAL HIGH (ref 11.5–15.5)
WBC Count: 4.8 10*3/uL (ref 4.0–10.5)
nRBC: 1 % — ABNORMAL HIGH (ref 0.0–0.2)

## 2023-07-17 LAB — MAGNESIUM: Magnesium: 1.4 mg/dL — ABNORMAL LOW (ref 1.7–2.4)

## 2023-07-17 LAB — HCG, SERUM, QUALITATIVE: Preg, Serum: NEGATIVE

## 2023-07-17 MED ORDER — SODIUM CHLORIDE 0.9% FLUSH
3.0000 mL | INTRAVENOUS | Status: DC | PRN
  Administered 2023-07-17: 10 mL via INTRAVENOUS

## 2023-07-17 MED ORDER — ATROPINE SULFATE 1 MG/ML IV SOLN
0.5000 mg | Freq: Once | INTRAVENOUS | Status: AC | PRN
  Administered 2023-07-17: 0.5 mg via INTRAVENOUS
  Filled 2023-07-17: qty 1

## 2023-07-17 MED ORDER — MAGNESIUM SULFATE 2 GM/50ML IV SOLN
2.0000 g | Freq: Once | INTRAVENOUS | Status: AC
  Administered 2023-07-17: 2 g via INTRAVENOUS
  Filled 2023-07-17: qty 50

## 2023-07-17 MED ORDER — SODIUM CHLORIDE 0.9 % IV SOLN
400.0000 mg/m2 | Freq: Once | INTRAVENOUS | Status: AC
  Administered 2023-07-17: 856 mg via INTRAVENOUS
  Filled 2023-07-17: qty 42.8

## 2023-07-17 MED ORDER — FOSAPREPITANT DIMEGLUMINE INJECTION 150 MG
150.0000 mg | Freq: Once | INTRAVENOUS | Status: AC
  Administered 2023-07-17: 150 mg via INTRAVENOUS
  Filled 2023-07-17: qty 5

## 2023-07-17 MED ORDER — SODIUM CHLORIDE 0.9 % IV SOLN: INTRAVENOUS | Status: DC

## 2023-07-17 MED ORDER — MAGNESIUM OXIDE -MG SUPPLEMENT 400 (240 MG) MG PO TABS: 400.0000 mg | ORAL_TABLET | Freq: Two times a day (BID) | ORAL | 3 refills | Status: DC

## 2023-07-17 MED ORDER — SODIUM CHLORIDE 0.9% FLUSH: 3.0000 mL | Freq: Two times a day (BID) | INTRAVENOUS | Status: DC

## 2023-07-17 MED ORDER — PALONOSETRON HCL INJECTION 0.25 MG/5ML
0.2500 mg | Freq: Once | INTRAVENOUS | Status: AC
  Administered 2023-07-17: 0.25 mg via INTRAVENOUS
  Filled 2023-07-17: qty 5

## 2023-07-17 MED ORDER — SODIUM CHLORIDE 0.9 % IV SOLN
150.0000 mg/m2 | Freq: Once | INTRAVENOUS | Status: AC
  Administered 2023-07-17: 320 mg via INTRAVENOUS
  Filled 2023-07-17: qty 10

## 2023-07-17 MED ORDER — DEXAMETHASONE SODIUM PHOSPHATE 10 MG/ML IJ SOLN
10.0000 mg | Freq: Once | INTRAMUSCULAR | Status: AC
  Administered 2023-07-17: 10 mg via INTRAVENOUS
  Filled 2023-07-17: qty 1

## 2023-07-17 MED ORDER — SODIUM CHLORIDE 0.9 % IV SOLN
2400.0000 mg/m2 | INTRAVENOUS | Status: DC
  Administered 2023-07-17: 5000 mg via INTRAVENOUS
  Filled 2023-07-17: qty 100

## 2023-07-17 NOTE — Progress Notes (Signed)
 Patient seen by Diana Forster NP today  Vitals are within treatment parameters:Yes   Labs are within treatment parameters: No (Please specify and give further instructions.) magnesium  1.4 plts 100. Stable mild elevation of AST/ALT, continue to monitor. Magnesium  is low. She will begin magnesium  oxide 400 mg twice daily. She will receive 2g IV magnesium  today.   Treatment plan has been signed: Yes   Per physician team, Patient is ready for treatment. Please note the following modifications: holding oxaliplatin 

## 2023-07-17 NOTE — Progress Notes (Signed)
 [facility] OFFICE PROGRESS NOTE   Diagnosis: Pancreas cancer  INTERVAL HISTORY:   Ms. Thorson returns as scheduled.  She completed another cycle of FOLFIRINOX 07/04/2023.  She was more fatigued after the most recent treatment.  She had mild intermittent nausea.  No mouth sores.  She had 3-4 episodes of diarrhea.  She took Imodium with good relief.  She then became constipated.  She has mild persistent cold sensitivity.  She has persistent numbness/tingling in the left hand without cold exposure.  Objective:  Vital signs in last 24 hours:  Blood pressure 134/85, pulse 81, temperature 98.2 F (36.8 C), temperature source Temporal, resp. rate 18, height 5\' 3"  (1.6 m), weight 225 lb 11.2 oz (102.4 kg), SpO2 98%.    HEENT: No thrush or ulcers. Resp: Lungs clear bilaterally. Cardio: Regular rate and rhythm. GI: Abdomen soft and nontender.  No hepatosplenomegaly. Vascular: No leg edema. Neuro: Vibratory sense mildly decreased over the fingertips per tuning fork exam. Skin: Palms without erythema. Port-A-Cath without erythema.  Lab Results:  Lab Results  Component Value Date   WBC 4.8 07/17/2023   HGB 9.1 (L) 07/17/2023   HCT 26.8 (L) 07/17/2023   MCV 98.2 07/17/2023   PLT 100 (L) 07/17/2023   NEUTROABS 2.3 07/17/2023    Imaging:  No results found.  Medications: I have reviewed the patient's current medications.  Assessment/Plan: Pancreas cancer CT abdomen/pelvis 03/24/2023: 3 x 4.6 cm pancreas tail mass, multiple liver lesions consistent with metastases, fat stranding surrounding the second and third ports of the duodenum and pancreas head/uncinate  CT chest 03/27/2023: New spiculated nodular density in the right lower lobe, 8 x 4 mm EUS 03/27/2023: Pancreas tail mass, liver lesions, T2 N0 M1, needle biopsy of pancreas mass, adenocarcinoma Foundation 1: K-ras G 12R, HRD signature negative, MSS, tumor mutation burden 0, PD-L1 TPS 1% Elevated CA 19-9 (121,720 on  03/26/2023) Cycle 1 FOLFIRINOX 04/10/2023 Chemotherapy held 04/24/2023 due to neutropenia Cycle 2 FOLFIRINOX 05/04/2023, Udenyca  Cycle 3 FOLFIRINOX 05/22/2023, Udenyca  Cycle 4 FOLFIRINOX 06/05/2023, Udenyca  Cycle 5 FOLFIRINOX 06/19/2023, Udenyca  CTs 06/26/2023: Stable pancreas mass, slight increase in size and number of hepatic metastases, improved ill-defined right lower lobe nodule, new nonspecific small groundglass nodules, improved stranding at the pancreas head/neck (review of CTs with patient-pancreas mass appears slightly smaller, several liver lesions are slightly larger others measured stable or slightly smaller Cycle 6 FOLFIRINOX 07/04/2023, Udenyca  Cycle 7 FOLFIRINOX 07/17/2023, Udenyca ; oxaliplatin  held due to neuropathy and thrombocytopenia   2.  Diabetes 3.  Acute abdominal pain and nausea/vomiting 03/23/2023 secondary to pancreatitis.  Improved 03/31/2023 4.  Hypertension. 5.  Asthma 6.  Hypothyroidism 7.  Hyperlipidemia 8.  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 Delzell appears stable.  She has completed 6 cycles of FOLFIRINOX.  Plan to proceed with cycle 7 today as scheduled.  Oxaliplatin  will be held due to progressive neuropathy symptoms and thrombocytopenia.  CBC and chemistry panel reviewed.  Labs adequate to proceed as above.  She has mild thrombocytopenia.  She will contact the office with bleeding.  Stable mild elevation of AST/ALT, continue to monitor.  Magnesium  is low.  She will begin magnesium  oxide 400 mg twice daily.  She will receive IV magnesium  today.  She will return for follow-up and treatment in 2 weeks.  We are available to see her sooner if needed.    Diana Forster ANP/GNP-BC   07/17/2023  10:23 AM

## 2023-07-17 NOTE — Patient Instructions (Addendum)
 [facility] - A DEPT OF [facility]   Discharge Instructions: The chemotherapy medication bag should finish at 46 hours, 96 hours, or 7 days. For example, if your pump is scheduled for 46 hours and it was put on at 4:00 p.m., it should finish at 2:00 p.m. the day it is scheduled to come off regardless of your appointment time.     Estimated time to finish at 12:45 WEDNESDAY July 19, 2023.   If the display on your pump reads "Low Volume" and it is beeping, take the batteries out of the pump and come to the cancer center for it to be taken off.   If the pump alarms go off prior to the pump reading "Low Volume" then call [phone] and someone can assist you.  If the plunger comes out and the chemotherapy medication is leaking out, please use your home chemo spill kit to clean up the spill. Do NOT use paper towels or other household products.  If you have problems or questions regarding your pump, please call either [phone] (24 hours a day) or the cancer center Monday-Friday 8:00 a.m.- 4:30 p.m. at the clinic number and we will assist you. If you are unable to get assistance, then go to the nearest Emergency Department and ask the staff to contact the IV team for assistance.   Thank you for choosing [facility] to provide your oncology and hematology care.   If you have a lab appointment with the Cancer Center, please go directly to the Cancer Center and check in at the registration area.   Wear comfortable clothing and clothing appropriate for easy access to any Portacath or PICC line.   We strive to give you quality time with your provider. You may need to reschedule your appointment if you arrive late (15 or more minutes).  Arriving late affects you and other patients whose appointments are after yours.  Also, if you miss three or more appointments without notifying the office, you may be dismissed from the clinic at the provider's  discretion.      For prescription refill requests, have your pharmacy contact our office and allow 72 hours for refills to be completed.    Today you received the following chemotherapy and/or immunotherapy agents IRINOTECAN /LEUCOVORIN /FLUOROURACIL /MAGNESIUM       To help prevent nausea and vomiting after your treatment, we encourage you to take your nausea medication as directed.  BELOW ARE SYMPTOMS THAT SHOULD BE REPORTED IMMEDIATELY: *FEVER GREATER THAN 100.4 F (38 C) OR HIGHER *CHILLS OR SWEATING *NAUSEA AND VOMITING THAT IS NOT CONTROLLED WITH YOUR NAUSEA MEDICATION *UNUSUAL SHORTNESS OF BREATH *UNUSUAL BRUISING OR BLEEDING *URINARY PROBLEMS (pain or burning when urinating, or frequent urination) *BOWEL PROBLEMS (unusual diarrhea, constipation, pain near the anus) TENDERNESS IN MOUTH AND THROAT WITH OR WITHOUT PRESENCE OF ULCERS (sore throat, sores in mouth, or a toothache) UNUSUAL RASH, SWELLING OR PAIN  UNUSUAL VAGINAL DISCHARGE OR ITCHING   Items with * indicate a potential emergency and should be followed up as soon as possible or go to the Emergency Department if any problems should occur.  Please show the CHEMOTHERAPY ALERT CARD or IMMUNOTHERAPY ALERT CARD at check-in to the Emergency Department and triage nurse.  Should you have questions after your visit or need to cancel or reschedule your appointment, please contact [facility] - A DEPT OF [facility]  Dept: [phone]  and follow the prompts.  Office hours  are 8:00 a.m. to 4:30 p.m. Monday - Friday. Please note that voicemails left after 4:00 p.m. may not be returned until the following business day.  We are closed weekends and major holidays. You have access to a nurse at all times for urgent questions. Please call the main number to the clinic Dept: [phone] and follow the prompts.   For any non-urgent questions, you may also contact your provider using MyChart. We now offer e-Visits  for anyone 31 and older to request care online for non-urgent symptoms. For details visit [email or web address].   Also download the MyChart app! Go to the app store, search "MyChart", open the app, select [facility], and log in with your MyChart username and password.  Irinotecan  Injection What is this medication? IRINOTECAN  (ir in oh TEE kan) treats some types of cancer. It works by slowing down the growth of cancer cells. This medicine may be used for other purposes; ask your health care provider or pharmacist if you have questions. COMMON BRAND NAME(S): Camptosar  What should I tell my care team before I take this medication? They need to know if you have any of these conditions: Dehydration Diarrhea Infection, especially a viral infection, such as chickenpox, cold sores, herpes Liver disease Low blood cell levels (white cells, red cells, and platelets) Low levels of electrolytes, such as calcium , magnesium , or potassium in your blood Recent or ongoing radiation An unusual or allergic reaction to irinotecan , other medications, foods, dyes, or preservatives If you or your partner are pregnant or trying to get pregnant Breast-feeding How should I use this medication? This medication is injected into a vein. It is given by your care team in a hospital or clinic setting. Talk to your care team about the use of this medication in children. Special care may be needed. Overdosage: If you think you have taken too much of this medicine contact a poison control center or emergency room at once. NOTE: This medicine is only for you. Do not share this medicine with others. What if I miss a dose? Keep appointments for follow-up doses. It is important not to miss your dose. Call your care team if you are unable to keep an appointment. What may interact with this medication? Do not take this medication with any of the following: Cobicistat Itraconazole This medication may also interact with the  following: Certain antibiotics, such as clarithromycin, rifampin, rifabutin Certain antivirals for HIV or AIDS Certain medications for fungal infections, such as ketoconazole , posaconazole, voriconazole Certain medications for seizures, such as carbamazepine, phenobarbital, phenytoin Gemfibrozil Nefazodone St. John's wort This list may not describe all possible interactions. Give your health care provider a list of all the medicines, herbs, non-prescription drugs, or dietary supplements you use. Also tell them if you smoke, drink alcohol, or use illegal drugs. Some items may interact with your medicine. What should I watch for while using this medication? Your condition will be monitored carefully while you are receiving this medication. You may need blood work while taking this medication. This medication may make you feel generally unwell. This is not uncommon as chemotherapy can affect healthy cells as well as cancer cells. Report any side effects. Continue your course of treatment even though you feel ill unless your care team tells you to stop. This medication can cause serious side effects. To reduce the risk, your care team may give you other medications to take before receiving this one. Be sure to follow the directions from your care team. This medication may  affect your coordination, reaction time, or judgement. Do not drive or operate machinery until you know how this medication affects you. Sit up or stand slowly to reduce the risk of dizzy or fainting spells. Drinking alcohol with this medication can increase the risk of these side effects. This medication may increase your risk of getting an infection. Call your care team for advice if you get a fever, chills, sore throat, or other symptoms of a cold or flu. Do not treat yourself. Try to avoid being around people who are sick. Avoid taking medications that contain aspirin, acetaminophen , ibuprofen, naproxen, or ketoprofen unless  instructed by your care team. These medications may hide a fever. This medication may increase your risk to bruise or bleed. Call your care team if you notice any unusual bleeding. Be careful brushing or flossing your teeth or using a toothpick because you may get an infection or bleed more easily. If you have any dental work done, tell your dentist you are receiving this medication. Talk to your care team if you or your partner are pregnant or think either of you might be pregnant. This medication can cause serious birth defects if taken during pregnancy and for 6 months after the last dose. You will need a negative pregnancy test before starting this medication. Contraception is recommended while taking this medication and for 6 months after the last dose. Your care team can help you find the option that works for you. Do not father a child while taking this medication and for 3 months after the last dose. Use a condom for contraception during this time period. Do not breastfeed while taking this medication and for 7 days after the last dose. This medication may cause infertility. Talk to your care team if you are concerned about your fertility. What side effects may I notice from receiving this medication? Side effects that you should report to your care team as soon as possible: Allergic reactions--skin rash, itching, hives, swelling of the face, lips, tongue, or throat Dry cough, shortness of breath or trouble breathing Increased saliva or tears, increased sweating, stomach cramping, diarrhea, small pupils, unusual weakness or fatigue, slow heartbeat Infection--fever, chills, cough, sore throat, wounds that don't heal, pain or trouble when passing urine, general feeling of discomfort or being unwell Kidney injury--decrease in the amount of urine, swelling of the ankles, hands, or feet Low red blood cell level--unusual weakness or fatigue, dizziness, headache, trouble breathing Severe or prolonged  diarrhea Unusual bruising or bleeding Side effects that usually do not require medical attention (report to your care team if they continue or are bothersome): Constipation Diarrhea Hair loss Loss of appetite Nausea Stomach pain This list may not describe all possible side effects. Call your doctor for medical advice about side effects. You may report side effects to FDA at 1-800-FDA-1088. Where should I keep my medication? This medication is given in a hospital or clinic. It will not be stored at home. NOTE: This sheet is a summary. It may not cover all possible information. If you have questions about this medicine, talk to your doctor, pharmacist, or health care provider.  2024 Elsevier/Gold Standard (2021-07-19 00:00:00) Leucovorin  Injection What is this medication? LEUCOVORIN  (loo koe VOR in) prevents side effects from certain medications, such as methotrexate. It works by increasing folate levels. This helps protect healthy cells in your body. It may also be used to treat anemia caused by low levels of folate. It can also be used with fluorouracil , a type of  chemotherapy, to treat colorectal cancer. It works by increasing the effects of fluorouracil  in the body. This medicine may be used for other purposes; ask your health care provider or pharmacist if you have questions. What should I tell my care team before I take this medication? They need to know if you have any of these conditions: Anemia from low levels of vitamin B12 in the blood An unusual or allergic reaction to leucovorin , folic acid, other medications, foods, dyes, or preservatives Pregnant or trying to get pregnant Breastfeeding How should I use this medication? This medication is injected into a vein or a muscle. It is given by your care team in a hospital or clinic setting. Talk to your care team about the use of this medication in children. Special care may be needed. Overdosage: If you think you have taken too much  of this medicine contact a poison control center or emergency room at once. NOTE: This medicine is only for you. Do not share this medicine with others. What if I miss a dose? Keep appointments for follow-up doses. It is important not to miss your dose. Call your care team if you are unable to keep an appointment. What may interact with this medication? Capecitabine Fluorouracil  Phenobarbital Phenytoin Primidone Trimethoprim;sulfamethoxazole This list may not describe all possible interactions. Give your health care provider a list of all the medicines, herbs, non-prescription drugs, or dietary supplements you use. Also tell them if you smoke, drink alcohol, or use illegal drugs. Some items may interact with your medicine. What should I watch for while using this medication? Your condition will be monitored carefully while you are receiving this medication. This medication may increase the side effects of 5-fluorouracil . Tell your care team if you have diarrhea or mouth sores that do not get better or that get worse. What side effects may I notice from receiving this medication? Side effects that you should report to your care team as soon as possible: Allergic reactions--skin rash, itching, hives, swelling of the face, lips, tongue, or throat This list may not describe all possible side effects. Call your doctor for medical advice about side effects. You may report side effects to FDA at 1-800-FDA-1088. Where should I keep my medication? This medication is given in a hospital or clinic. It will not be stored at home. NOTE: This sheet is a summary. It may not cover all possible information. If you have questions about this medicine, talk to your doctor, pharmacist, or health care provider.  2024 Elsevier/Gold Standard (2021-08-10 00:00:00)  Fluorouracil  Injection What is this medication? FLUOROURACIL  (flure oh YOOR a sil) treats some types of cancer. It works by slowing down the growth of  cancer cells. This medicine may be used for other purposes; ask your health care provider or pharmacist if you have questions. COMMON BRAND NAME(S): Adrucil  What should I tell my care team before I take this medication? They need to know if you have any of these conditions: Blood disorders Dihydropyrimidine dehydrogenase (DPD) deficiency Infection, such as chickenpox, cold sores, herpes Kidney disease Liver disease Poor nutrition Recent or ongoing radiation therapy An unusual or allergic reaction to fluorouracil , other medications, foods, dyes, or preservatives If you or your partner are pregnant or trying to get pregnant Breast-feeding How should I use this medication? This medication is injected into a vein. It is administered by your care team in a hospital or clinic setting. Talk to your care team about the use of this medication in children. Special care  may be needed. Overdosage: If you think you have taken too much of this medicine contact a poison control center or emergency room at once. NOTE: This medicine is only for you. Do not share this medicine with others. What if I miss a dose? Keep appointments for follow-up doses. It is important not to miss your dose. Call your care team if you are unable to keep an appointment. What may interact with this medication? Do not take this medication with any of the following: Live virus vaccines This medication may also interact with the following: Medications that treat or prevent blood clots, such as warfarin, enoxaparin , dalteparin This list may not describe all possible interactions. Give your health care provider a list of all the medicines, herbs, non-prescription drugs, or dietary supplements you use. Also tell them if you smoke, drink alcohol, or use illegal drugs. Some items may interact with your medicine. What should I watch for while using this medication? Your condition will be monitored carefully while you are receiving this  medication. This medication may make you feel generally unwell. This is not uncommon as chemotherapy can affect healthy cells as well as cancer cells. Report any side effects. Continue your course of treatment even though you feel ill unless your care team tells you to stop. In some cases, you may be given additional medications to help with side effects. Follow all directions for their use. This medication may increase your risk of getting an infection. Call your care team for advice if you get a fever, chills, sore throat, or other symptoms of a cold or flu. Do not treat yourself. Try to avoid being around people who are sick. This medication may increase your risk to bruise or bleed. Call your care team if you notice any unusual bleeding. Be careful brushing or flossing your teeth or using a toothpick because you may get an infection or bleed more easily. If you have any dental work done, tell your dentist you are receiving this medication. Avoid taking medications that contain aspirin, acetaminophen , ibuprofen, naproxen, or ketoprofen unless instructed by your care team. These medications may hide a fever. Do not treat diarrhea with over the counter products. Contact your care team if you have diarrhea that lasts more than 2 days or if it is severe and watery. This medication can make you more sensitive to the sun. Keep out of the sun. If you cannot avoid being in the sun, wear protective clothing and sunscreen. Do not use sun lamps, tanning beds, or tanning booths. Talk to your care team if you or your partner wish to become pregnant or think you might be pregnant. This medication can cause serious birth defects if taken during pregnancy and for 3 months after the last dose. A reliable form of contraception is recommended while taking this medication and for 3 months after the last dose. Talk to your care team about effective forms of contraception. Do not father a child while taking this medication  and for 3 months after the last dose. Use a condom while having sex during this time period. Do not breastfeed while taking this medication. This medication may cause infertility. Talk to your care team if you are concerned about your fertility. What side effects may I notice from receiving this medication? Side effects that you should report to your care team as soon as possible: Allergic reactions--skin rash, itching, hives, swelling of the face, lips, tongue, or throat Heart attack--pain or tightness in the chest, shoulders, arms,  or jaw, nausea, shortness of breath, cold or clammy skin, feeling faint or lightheaded Heart failure--shortness of breath, swelling of the ankles, feet, or hands, sudden weight gain, unusual weakness or fatigue Heart rhythm changes--fast or irregular heartbeat, dizziness, feeling faint or lightheaded, chest pain, trouble breathing High ammonia level--unusual weakness or fatigue, confusion, loss of appetite, nausea, vomiting, seizures Infection--fever, chills, cough, sore throat, wounds that don't heal, pain or trouble when passing urine, general feeling of discomfort or being unwell Low red blood cell level--unusual weakness or fatigue, dizziness, headache, trouble breathing Pain, tingling, or numbness in the hands or feet, muscle weakness, change in vision, confusion or trouble speaking, loss of balance or coordination, trouble walking, seizures Redness, swelling, and blistering of the skin over hands and feet Severe or prolonged diarrhea Unusual bruising or bleeding Side effects that usually do not require medical attention (report to your care team if they continue or are bothersome): Dry skin Headache Increased tears Nausea Pain, redness, or swelling with sores inside the mouth or throat Sensitivity to light Vomiting This list may not describe all possible side effects. Call your doctor for medical advice about side effects. You may report side effects to FDA  at 1-800-FDA-1088. Where should I keep my medication? This medication is given in a hospital or clinic. It will not be stored at home. NOTE: This sheet is a summary. It may not cover all possible information. If you have questions about this medicine, talk to your doctor, pharmacist, or health care provider.  2024 Elsevier/Gold Standard (2021-07-13 00:00:00)  Hypomagnesemia Hypomagnesemia is a condition in which the level of magnesium  in the blood is too low. Magnesium  is a mineral that is found in many foods. It is used in many different processes in the body. Hypomagnesemia can affect every organ in the body. In severe cases, it can cause life-threatening problems. What are the causes? This condition may be caused by: Not getting enough magnesium  in your diet or not having enough healthy foods to eat (malnutrition). Problems with magnesium  absorption in the intestines. Dehydration. Excessive use of alcohol. Vomiting. Severe or long-term (chronic) diarrhea. Some medicines, including medicines that make you urinate more often (diuretics). Certain diseases, such as kidney disease, diabetes, celiac disease, and overactive thyroid. What are the signs or symptoms? Symptoms of this condition include: Loss of appetite, nausea, and vomiting. Involuntary shaking or trembling of a body part (tremor). Muscle weakness or tingling in the arms and legs. Sudden tightening of muscles (muscle spasms). Confusion. Psychiatric issues, such as: Depression and irritability. Psychosis. A feeling of fluttering of the heart (palpitations). Seizures. These symptoms are more severe if magnesium  levels drop suddenly. How is this diagnosed? This condition may be diagnosed based on: Your symptoms and medical history. A physical exam. Blood and urine tests. How is this treated? Treatment depends on the cause and the severity of the condition. It may be treated by: Taking a magnesium  supplement. This can be  taken in pill form. If the condition is severe, magnesium  is usually given through an IV. Making changes to your diet. You may be directed to eat foods that have a lot of magnesium , such as green leafy vegetables, peas, beans, and nuts. Not drinking alcohol. If you are struggling not to drink, ask your health care provider for help. Follow these instructions at home: Eating and drinking     Make sure that your diet includes foods with magnesium . Foods that have a lot of magnesium  in them include: Green leafy vegetables,  such as spinach and broccoli. Beans and peas. Nuts and seeds, such as almonds and sunflower seeds. Whole grains, such as whole grain bread and fortified cereals. Drink fluids that contain salts and minerals (electrolytes), such as sports drinks, when you are active. Do not drink alcohol. General instructions Take over-the-counter and prescription medicines only as told by your health care provider. Take magnesium  supplements as directed if your health care provider tells you to take them. Have your magnesium  levels monitored as told by your health care provider. Keep all follow-up visits. This is important. Contact a health care provider if: You get worse instead of better. Your symptoms return. Get help right away if: You develop severe muscle weakness. You have trouble breathing. You feel that your heart is racing. These symptoms may represent a serious problem that is an emergency. Do not wait to see if the symptoms will go away. Get medical help right away. Call your local emergency services (911 in the [place] Do not drive yourself to the hospital. Summary Hypomagnesemia is a condition in which the level of magnesium  in the blood is too low. Hypomagnesemia can affect every organ in the body. Treatment may include eating more foods that contain magnesium , taking magnesium  supplements, and not drinking alcohol. Have your magnesium  levels monitored as told by your  health care provider. This information is not intended to replace advice given to you by your health care provider. Make sure you discuss any questions you have with your health care provider. Document Revised: 08/04/2020 Document Reviewed: 08/04/2020 Elsevier Patient Education  2024 [employer].

## 2023-07-18 LAB — CANCER ANTIGEN 19-9: CA 19-9: 79822 U/mL — ABNORMAL HIGH (ref 0–35)

## 2023-07-19 ENCOUNTER — Inpatient Hospital Stay

## 2023-07-19 VITALS — BP 131/88 | HR 70 | Temp 98.7°F | Resp 16

## 2023-07-19 DIAGNOSIS — Z5111 Encounter for antineoplastic chemotherapy: Secondary | ICD-10-CM | POA: Diagnosis not present

## 2023-07-19 DIAGNOSIS — C259 Malignant neoplasm of pancreas, unspecified: Secondary | ICD-10-CM

## 2023-07-19 MED ORDER — PEGFILGRASTIM-CBQV 6 MG/0.6ML ~~LOC~~ SOSY
6.0000 mg | PREFILLED_SYRINGE | Freq: Once | SUBCUTANEOUS | Status: AC
Start: 2023-07-19 — End: 2023-07-19
  Administered 2023-07-19: 6 mg via SUBCUTANEOUS

## 2023-07-19 MED ORDER — HEPARIN SOD (PORK) LOCK FLUSH 100 UNIT/ML IV SOLN
500.0000 [IU] | Freq: Once | INTRAVENOUS | Status: AC | PRN
  Administered 2023-07-19: 500 [IU]

## 2023-07-19 MED ORDER — SODIUM CHLORIDE 0.9% FLUSH
10.0000 mL | INTRAVENOUS | Status: DC | PRN
  Administered 2023-07-19: 10 mL

## 2023-07-20 ENCOUNTER — Encounter: Payer: Self-pay | Admitting: Oncology

## 2023-07-20 ENCOUNTER — Ambulatory Visit (INDEPENDENT_AMBULATORY_CARE_PROVIDER_SITE_OTHER): Admitting: Internal Medicine

## 2023-07-20 ENCOUNTER — Encounter: Payer: Self-pay | Admitting: Internal Medicine

## 2023-07-20 VITALS — BP 112/70 | HR 75 | Temp 98.0°F | Ht 63.0 in | Wt 222.8 lb

## 2023-07-20 DIAGNOSIS — R110 Nausea: Secondary | ICD-10-CM | POA: Diagnosis not present

## 2023-07-20 DIAGNOSIS — C259 Malignant neoplasm of pancreas, unspecified: Secondary | ICD-10-CM

## 2023-07-20 DIAGNOSIS — E039 Hypothyroidism, unspecified: Secondary | ICD-10-CM

## 2023-07-20 DIAGNOSIS — T451X5A Adverse effect of antineoplastic and immunosuppressive drugs, initial encounter: Secondary | ICD-10-CM

## 2023-07-20 DIAGNOSIS — E1169 Type 2 diabetes mellitus with other specified complication: Secondary | ICD-10-CM

## 2023-07-20 DIAGNOSIS — E876 Hypokalemia: Secondary | ICD-10-CM

## 2023-07-20 DIAGNOSIS — E11649 Type 2 diabetes mellitus with hypoglycemia without coma: Secondary | ICD-10-CM

## 2023-07-20 DIAGNOSIS — G620 Drug-induced polyneuropathy: Secondary | ICD-10-CM

## 2023-07-20 DIAGNOSIS — E785 Hyperlipidemia, unspecified: Secondary | ICD-10-CM

## 2023-07-20 DIAGNOSIS — E160 Drug-induced hypoglycemia without coma: Secondary | ICD-10-CM

## 2023-07-20 DIAGNOSIS — T383X5A Adverse effect of insulin and oral hypoglycemic [antidiabetic] drugs, initial encounter: Secondary | ICD-10-CM

## 2023-07-20 DIAGNOSIS — I10 Essential (primary) hypertension: Secondary | ICD-10-CM

## 2023-07-20 DIAGNOSIS — T451X5D Adverse effect of antineoplastic and immunosuppressive drugs, subsequent encounter: Secondary | ICD-10-CM

## 2023-07-20 DIAGNOSIS — E1165 Type 2 diabetes mellitus with hyperglycemia: Secondary | ICD-10-CM

## 2023-07-20 MED ORDER — LOSARTAN POTASSIUM-HCTZ 50-12.5 MG PO TABS: 0.5000 | ORAL_TABLET | Freq: Every day | ORAL | 4 refills | Status: AC

## 2023-07-20 MED ORDER — SCOPOLAMINE 1 MG/3DAYS TD PT72: 1.0000 | MEDICATED_PATCH | TRANSDERMAL | 12 refills | Status: DC

## 2023-07-20 MED ORDER — GVOKE HYPOPEN 2-PACK 0.5 MG/0.1ML ~~LOC~~ SOAJ
0.5000 mg | Freq: Every day | SUBCUTANEOUS | 1 refills | Status: AC | PRN
Start: 2023-07-20 — End: ?

## 2023-07-20 MED ORDER — MAGNESIUM GLYCINATE 100 MG PO CAPS: 1.0000 | ORAL_CAPSULE | Freq: Every day | ORAL | 3 refills | Status: DC

## 2023-07-20 NOTE — Progress Notes (Unsigned)
 Interim history: {Thyroid signs and symptoms:12423}    Latest Ref Rng & Units 03/01/2023    2:42 PM 08/22/2022    8:47 AM 08/18/2021    8:53 AM 12/29/2020    9:20 AM 08/04/2020    8:24 AM 04/03/2019    7:30 AM  THYROID  TSH 0.350 - 4.500 uIU/mL 0.238  1.35  0.84  1.05  1.19  2.04    anti-TPO antibodies: N/A  anti-Tg antibodies: N/A  Anti-TSI antibodies: N/A   Family history of thyroid disease: Patient last evaluated for thyroid nodules: Last ultrasound for thyroid nodules: N/A  Last heart exam for rhythm check:

## 2023-07-22 NOTE — Assessment & Plan Note (Signed)
 Diabetes management is complicated by chemotherapy. Metformin  contributes to nausea and diarrhea. Insulin  management is required due to chemotherapy-induced weight loss and steroid-induced hyperglycemia. Hypoglycemia episodes occur, particularly at night. CGM shows 92% in range, with 1% high and 1% very low. Discontinue metformin  due to nausea and diarrhea. Continue insulin  therapy with current dosing and monitor glucose levels with CGM. Provide a Gvoke pen for hypoglycemia management and adjust insulin  as needed based on CGM readings.

## 2023-07-22 NOTE — Assessment & Plan Note (Signed)
 The pancreatic adenocarcinoma is not optimally responding to FOLFOX chemotherapy. Although CA19-9 levels have decreased, CT scans show increased liver lesions. Treatment remains palliative, and she understands this, considering alternative options if current treatments fail. Interest in ivermectin was discussed, but it is not supported by current medical guidelines. Continue FOLFOX chemotherapy and schedule a follow-up CT scan after three more cycles. Discuss alternative chemotherapy options if the current regimen remains ineffective. Educate on the risks of non-standard treatments like ivermectin.

## 2023-07-22 NOTE — Assessment & Plan Note (Signed)
 Will order lab testing to guide management.     Latest Ref Rng & Units 03/01/2023    2:42 PM 08/22/2022    8:47 AM 08/18/2021    8:53 AM 12/29/2020    9:20 AM 08/04/2020    8:24 AM 04/03/2019    7:30 AM  THYROID  TSH 0.350 - 4.500 uIU/mL 0.238  1.35  0.84  1.05  1.19  2.04

## 2023-07-22 NOTE — Patient Instructions (Signed)
 VISIT SUMMARY:  During your visit, we discussed the management of your pancreatic adenocarcinoma and the side effects you are experiencing from chemotherapy and diabetes treatment. We reviewed your current symptoms, including nausea, diarrhea, neuropathy, and blood sugar fluctuations, and made adjustments to your treatment plan to help manage these issues more effectively.  YOUR PLAN:  -PANCREATIC ADENOCARCINOMA: Your pancreatic cancer is not responding optimally to the current FOLFOX chemotherapy, as shown by increased liver lesions on your CT scan, despite a decrease in CA19-9 levels. We will continue with the FOLFOX chemotherapy for now and schedule a follow-up CT scan after three more cycles. If the current treatment remains ineffective, we will discuss alternative chemotherapy options. We also talked about the risks of non-standard treatments like ivermectin, which are not supported by current medical guidelines.  -CHEMOTHERAPY-INDUCED NAUSEA AND DIARRHEA: You are experiencing significant nausea and diarrhea due to chemotherapy. We will continue your current antiemetic medications, Zofran  and Compazine , and have discontinued metformin  to reduce gastrointestinal side effects. We have also switched your magnesium  supplement to magnesium  glycinate to help reduce diarrhea. If vomiting occurs, we may consider adding a scopolamine  patch for additional nausea control.  -PERIPHERAL NEUROPATHY DUE TO CHEMOTHERAPY: You have neuropathy in your hands and feet, particularly severe in your left hand, likely due to chemotherapy. We will adjust your chemotherapy regimen as needed based on your neuropathy symptoms.  -DIABETES MELLITUS TYPE 2: Managing your diabetes is complicated by chemotherapy. We have discontinued metformin  due to its contribution to nausea and diarrhea. You will continue with insulin  therapy and monitor your glucose levels with a continuous glucose monitor (CGM). We have provided you with a  Gvoke pen for emergency hypoglycemia management and will adjust your insulin  as needed based on your CGM readings.  -HYPOGLYCEMIA: You have episodes of low blood sugar, particularly at night, likely exacerbated by metformin  and chemotherapy. We have discontinued metformin  to reduce the risk of hypoglycemia and provided you with a Gvoke pen for emergency management of low blood sugar.  -HYPOKALEMIA: You have low potassium levels, likely related to chemotherapy and associated diarrhea. You will continue potassium supplementation as needed.  -HYPOMAGNESEMIA: You have low magnesium  levels, which require supplementation. We have switched your magnesium  supplement to magnesium  glycinate to help reduce diarrhea.  -HYPERTENSION: Your high blood pressure management is complicated by chemotherapy. We have reduced your losartan /hydrochlorothiazide  dose by half to better manage your blood pressure during chemotherapy.  -GENERAL HEALTH MAINTENANCE: Routine health maintenance is important during chemotherapy. We will check your thyroid function and A1c levels at your next oncology visit.  -GOALS OF CARE: You understand that your current treatment is palliative and not curative. You are considering discontinuing treatment if it only prolongs life without improving quality. We will discuss your goals of care and potential treatment options after your upcoming CT scan results.  INSTRUCTIONS:  Please continue with your current FOLFOX chemotherapy regimen and schedule a follow-up CT scan after three more cycles. Monitor your blood sugar levels with your CGM and use the Gvoke pen if you experience severe hypoglycemia. Continue taking your potassium and magnesium  glycinate supplements as needed. We will check your thyroid function and A1c levels at your next oncology visit. If you have any new or worsening symptoms, please contact our office.

## 2023-07-24 ENCOUNTER — Encounter: Payer: Self-pay | Admitting: Oncology

## 2023-07-30 ENCOUNTER — Other Ambulatory Visit: Payer: Self-pay | Admitting: Oncology

## 2023-07-31 ENCOUNTER — Inpatient Hospital Stay: Attending: Nurse Practitioner

## 2023-07-31 ENCOUNTER — Encounter: Payer: Self-pay | Admitting: Nurse Practitioner

## 2023-07-31 ENCOUNTER — Inpatient Hospital Stay

## 2023-07-31 ENCOUNTER — Inpatient Hospital Stay (HOSPITAL_BASED_OUTPATIENT_CLINIC_OR_DEPARTMENT_OTHER): Admitting: Nurse Practitioner

## 2023-07-31 VITALS — BP 138/84 | HR 73 | Temp 98.1°F | Resp 18 | Ht 63.0 in | Wt 228.2 lb

## 2023-07-31 DIAGNOSIS — C259 Malignant neoplasm of pancreas, unspecified: Secondary | ICD-10-CM

## 2023-07-31 DIAGNOSIS — Z5111 Encounter for antineoplastic chemotherapy: Secondary | ICD-10-CM | POA: Insufficient documentation

## 2023-07-31 DIAGNOSIS — Z79631 Long term (current) use of antimetabolite agent: Secondary | ICD-10-CM | POA: Insufficient documentation

## 2023-07-31 DIAGNOSIS — Z79634 Long term (current) use of topoisomerase inhibitor: Secondary | ICD-10-CM | POA: Diagnosis not present

## 2023-07-31 DIAGNOSIS — C252 Malignant neoplasm of tail of pancreas: Secondary | ICD-10-CM | POA: Insufficient documentation

## 2023-07-31 DIAGNOSIS — C787 Secondary malignant neoplasm of liver and intrahepatic bile duct: Secondary | ICD-10-CM | POA: Diagnosis present

## 2023-07-31 DIAGNOSIS — Z5189 Encounter for other specified aftercare: Secondary | ICD-10-CM | POA: Insufficient documentation

## 2023-07-31 DIAGNOSIS — Z95828 Presence of other vascular implants and grafts: Secondary | ICD-10-CM

## 2023-07-31 LAB — CBC WITH DIFFERENTIAL (CANCER CENTER ONLY)
Abs Immature Granulocytes: 0.23 10*3/uL — ABNORMAL HIGH (ref 0.00–0.07)
Basophils Absolute: 0 10*3/uL (ref 0.0–0.1)
Basophils Relative: 1 %
Eosinophils Absolute: 0.1 10*3/uL (ref 0.0–0.5)
Eosinophils Relative: 2 %
HCT: 30.1 % — ABNORMAL LOW (ref 36.0–46.0)
Hemoglobin: 9.9 g/dL — ABNORMAL LOW (ref 12.0–15.0)
Immature Granulocytes: 4 %
Lymphocytes Relative: 30 %
Lymphs Abs: 1.7 10*3/uL (ref 0.7–4.0)
MCH: 33.8 pg (ref 26.0–34.0)
MCHC: 32.9 g/dL (ref 30.0–36.0)
MCV: 102.7 fL — ABNORMAL HIGH (ref 80.0–100.0)
Monocytes Absolute: 0.7 10*3/uL (ref 0.1–1.0)
Monocytes Relative: 13 %
Neutro Abs: 2.8 10*3/uL (ref 1.7–7.7)
Neutrophils Relative %: 50 %
Platelet Count: 161 10*3/uL (ref 150–400)
RBC: 2.93 MIL/uL — ABNORMAL LOW (ref 3.87–5.11)
RDW: 21.3 % — ABNORMAL HIGH (ref 11.5–15.5)
WBC Count: 5.6 10*3/uL (ref 4.0–10.5)
nRBC: 0.5 % — ABNORMAL HIGH (ref 0.0–0.2)

## 2023-07-31 LAB — CMP (CANCER CENTER ONLY)
ALT: 124 U/L — ABNORMAL HIGH (ref 0–44)
AST: 102 U/L — ABNORMAL HIGH (ref 15–41)
Albumin: 4.2 g/dL (ref 3.5–5.0)
Alkaline Phosphatase: 308 U/L — ABNORMAL HIGH (ref 38–126)
Anion gap: 12 (ref 5–15)
BUN: 9 mg/dL (ref 6–20)
CO2: 25 mmol/L (ref 22–32)
Calcium: 10.1 mg/dL (ref 8.9–10.3)
Chloride: 103 mmol/L (ref 98–111)
Creatinine: 0.69 mg/dL (ref 0.44–1.00)
GFR, Estimated: >60 mL/min (ref >60)
Glucose, Bld: 144 mg/dL — ABNORMAL HIGH (ref 70–99)
Potassium: 4.1 mmol/L (ref 3.5–5.1)
Sodium: 141 mmol/L (ref 135–145)
Total Bilirubin: 0.3 mg/dL (ref 0.0–1.2)
Total Protein: 7.1 g/dL (ref 6.5–8.1)

## 2023-07-31 LAB — MAGNESIUM: Magnesium: 2.1 mg/dL (ref 1.7–2.4)

## 2023-07-31 LAB — HCG, SERUM, QUALITATIVE: Preg, Serum: NEGATIVE

## 2023-07-31 MED ORDER — SODIUM CHLORIDE 0.9% FLUSH
10.0000 mL | Freq: Once | INTRAVENOUS | Status: AC
  Administered 2023-07-31: 10 mL via INTRAVENOUS

## 2023-07-31 MED ORDER — SODIUM CHLORIDE 0.9 % IV SOLN
150.0000 mg | Freq: Once | INTRAVENOUS | Status: AC
  Administered 2023-07-31: 150 mg via INTRAVENOUS
  Filled 2023-07-31: qty 150

## 2023-07-31 MED ORDER — SODIUM CHLORIDE 0.9 % IV SOLN
2400.0000 mg/m2 | INTRAVENOUS | Status: DC
  Administered 2023-07-31: 5000 mg via INTRAVENOUS
  Filled 2023-07-31: qty 100

## 2023-07-31 MED ORDER — IRINOTECAN HCL CHEMO INJECTION 100 MG/5ML
120.0000 mg/m2 | Freq: Once | INTRAVENOUS | Status: AC
  Administered 2023-07-31: 260 mg via INTRAVENOUS
  Filled 2023-07-31: qty 13

## 2023-07-31 MED ORDER — SODIUM CHLORIDE 0.9 % IV SOLN
400.0000 mg/m2 | Freq: Once | INTRAVENOUS | Status: AC
  Administered 2023-07-31: 856 mg via INTRAVENOUS
  Filled 2023-07-31: qty 42.8

## 2023-07-31 MED ORDER — DEXAMETHASONE SODIUM PHOSPHATE 10 MG/ML IJ SOLN
10.0000 mg | Freq: Once | INTRAMUSCULAR | Status: AC
  Administered 2023-07-31: 10 mg via INTRAVENOUS
  Filled 2023-07-31: qty 1

## 2023-07-31 MED ORDER — DEXTROSE 5 % IV SOLN: INTRAVENOUS | Status: DC

## 2023-07-31 MED ORDER — PALONOSETRON HCL INJECTION 0.25 MG/5ML
0.2500 mg | Freq: Once | INTRAVENOUS | Status: AC
  Administered 2023-07-31: 0.25 mg via INTRAVENOUS
  Filled 2023-07-31: qty 5

## 2023-07-31 MED ORDER — ATROPINE SULFATE 1 MG/ML IV SOLN
0.5000 mg | Freq: Once | INTRAVENOUS | Status: AC | PRN
  Administered 2023-07-31: 0.5 mg via INTRAVENOUS
  Filled 2023-07-31: qty 1

## 2023-07-31 MED ORDER — ALPRAZOLAM 0.25 MG PO TABS: 0.2500 mg | ORAL_TABLET | Freq: Two times a day (BID) | ORAL | 0 refills | Status: DC | PRN

## 2023-07-31 NOTE — Progress Notes (Signed)
 Debbie [name]   Diagnosis: Pancreas cancer  INTERVAL HISTORY:   Debbie [name] returns as scheduled.  She completed cycle 7 FOLFIRINOX 07/17/2023.  Oxaliplatin  was held due to neuropathy and thrombocytopenia.  She has mild edema and diarrhea after treatment.  She discontinued metformin  per PCP around this time.  Symptoms resolved coinciding with discontinuation of metformin .  No mouth sores.  No hand or foot pain or redness.  Persistent neuropathy symptoms, "bad" in the left hand with consistent numbness/tingling.  Objective:  Vital signs in last 24 hours:  Blood pressure 138/84, pulse 73, temperature 98.1 F (36.7 C), temperature source Temporal, resp. rate 18, height 5\' 3"  (1.6 m), weight 228 lb 3.2 oz (103.5 kg), SpO2 100%.    HEENT: No thrush or ulcers. Resp: Lungs clear bilaterally Cardio: Regular rate and rhythm. GI: Tender.  No hepatosplenomegaly.  No mass. Vascular: No leg edema. Neuro: Vibratory sense moderately decreased over the fingertips per tuning fork exam. Skin: Palms without erythema. Port-A-Cath without erythema.  Lab Results:  Lab Results  Component Value Date   WBC 5.6 07/31/2023   HGB 9.9 (L) 07/31/2023   HCT 30.1 (L) 07/31/2023   MCV 102.7 (H) 07/31/2023   PLT 161 07/31/2023   NEUTROABS 2.8 07/31/2023    Imaging:  No results found.  Medications: I have reviewed the patient's current medications.  Assessment/Plan: Pancreas cancer CT abdomen/pelvis 03/24/2023: 3 x 4.6 cm pancreas tail mass, multiple liver lesions consistent with metastases, fat stranding surrounding the second and third ports of the duodenum and pancreas head/uncinate  CT chest 03/27/2023: New spiculated nodular density in the right lower lobe, 8 x 4 mm EUS 03/27/2023: Pancreas tail mass, liver lesions, T2 N0 M1, needle biopsy of pancreas mass, adenocarcinoma Foundation 1: K-ras G 12R, HRD signature negative, MSS, tumor mutation burden 0, PD-L1 TPS  1% Elevated CA 19-9 (121,720 on 03/26/2023) Cycle 1 FOLFIRINOX 04/10/2023 Chemotherapy held 04/24/2023 due to neutropenia Cycle 2 FOLFIRINOX 05/04/2023, Udenyca  Cycle 3 FOLFIRINOX 05/22/2023, Udenyca  Cycle 4 FOLFIRINOX 06/05/2023, Udenyca  Cycle 5 FOLFIRINOX 06/19/2023, Udenyca  CTs 06/26/2023: Stable pancreas mass, slight increase in size and number of hepatic metastases, improved ill-defined right lower lobe nodule, new nonspecific small groundglass nodules, improved stranding at the pancreas head/neck (review of CTs with patient-pancreas mass appears slightly smaller, several liver lesions are slightly larger others measured stable or slightly smaller Cycle 6 FOLFIRINOX 07/04/2023, Udenyca  Cycle 7 FOLFIRINOX 07/17/2023, Udenyca ; oxaliplatin  held due to neuropathy and thrombocytopenia Cycle 8 FOLFIRINOX 07/31/2023, Udenyca ; oxaliplatin  held due to neuropathy   2.  Diabetes 3.  Acute abdominal pain and nausea/vomiting 03/23/2023 secondary to pancreatitis.  Improved 03/31/2023 4.  Hypertension. 5.  Asthma 6.  Hypothyroidism 7.  Hyperlipidemia 8.  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  She has completed 7 cycles of FOLFIRINOX.  Oxaliplatin  was held with cycle 7 due to neuropathy and thrombocytopenia.  Oxaliplatin  will remain on hold due to persistent neuropathy symptoms.  Plan to proceed with the remainder of the regimen today as scheduled.  Irinotecan  will be dose reduced due to elevated AST/ALT. She agrees with this plan.  Restaging CTs prior to next office visit.  CBC and chemistry panel reviewed.  Labs adequate to proceed as above.  AST/ALT are higher.  Irinotecan  will be dose reduced as noted above.  She will return for follow-up and treatment in 2 weeks.  Plan reviewed with Dr. Scherrie Curt.    Debbie [name] ANP/GNP-BC  07/31/2023  10:30 AM

## 2023-07-31 NOTE — Progress Notes (Signed)
 Patient seen by Diana Forster NP today  Vitals are within treatment parameters:Yes   Labs are within treatment parameters: No (Please specify and give further instructions.) AST 102 ALT 124  Treatment plan has been signed: Yes   Per physician team, Patient is ready for treatment. Please note the following modifications: no oxaliplatin  and dose reduced the irinotecan .

## 2023-08-01 LAB — CANCER ANTIGEN 19-9: CA 19-9: 65108 U/mL — ABNORMAL HIGH (ref 0–35)

## 2023-08-02 ENCOUNTER — Inpatient Hospital Stay

## 2023-08-02 VITALS — BP 133/87 | HR 66 | Temp 98.2°F | Resp 18

## 2023-08-02 DIAGNOSIS — Z5111 Encounter for antineoplastic chemotherapy: Secondary | ICD-10-CM | POA: Diagnosis not present

## 2023-08-02 DIAGNOSIS — C259 Malignant neoplasm of pancreas, unspecified: Secondary | ICD-10-CM

## 2023-08-02 MED ORDER — PEGFILGRASTIM-CBQV 6 MG/0.6ML ~~LOC~~ SOSY
6.0000 mg | PREFILLED_SYRINGE | Freq: Once | SUBCUTANEOUS | Status: AC
Start: 2023-08-02 — End: 2023-08-02
  Administered 2023-08-02: 6 mg via SUBCUTANEOUS
  Filled 2023-08-02: qty 0.6

## 2023-08-02 MED ORDER — SODIUM CHLORIDE 0.9% FLUSH
10.0000 mL | INTRAVENOUS | Status: DC | PRN
Start: 2023-08-02 — End: 2023-08-02
  Administered 2023-08-02: 10 mL

## 2023-08-02 MED ORDER — HEPARIN SOD (PORK) LOCK FLUSH 100 UNIT/ML IV SOLN
500.0000 [IU] | Freq: Once | INTRAVENOUS | Status: AC | PRN
Start: 2023-08-02 — End: 2023-08-02
  Administered 2023-08-02: 500 [IU]

## 2023-08-09 ENCOUNTER — Ambulatory Visit (HOSPITAL_BASED_OUTPATIENT_CLINIC_OR_DEPARTMENT_OTHER)
Admission: RE | Admit: 2023-08-09 | Discharge: 2023-08-09 | Disposition: A | Discharge status: Home / Self Care | Source: Ambulatory Visit | Attending: Nurse Practitioner | Admitting: Nurse Practitioner

## 2023-08-09 ENCOUNTER — Ambulatory Visit: Payer: Self-pay | Admitting: Oncology

## 2023-08-09 ENCOUNTER — Encounter: Payer: Self-pay | Admitting: Nurse Practitioner

## 2023-08-09 DIAGNOSIS — C259 Malignant neoplasm of pancreas, unspecified: Secondary | ICD-10-CM

## 2023-08-09 MED ORDER — IOHEXOL 300 MG/ML  SOLN
100.0000 mL | Freq: Once | INTRAMUSCULAR | Status: AC | PRN
  Administered 2023-08-09: 85 mL via INTRAVENOUS

## 2023-08-10 ENCOUNTER — Other Ambulatory Visit: Payer: Self-pay | Admitting: Oncology

## 2023-08-10 DIAGNOSIS — C259 Malignant neoplasm of pancreas, unspecified: Secondary | ICD-10-CM

## 2023-08-11 ENCOUNTER — Telehealth: Payer: Self-pay | Admitting: Nurse Practitioner

## 2023-08-11 NOTE — Telephone Encounter (Signed)
 I received a MyChart message requesting a return call to review recent CT scan.  Attempted to call back.  Left message to please contact the office.

## 2023-08-14 ENCOUNTER — Other Ambulatory Visit: Payer: Self-pay | Admitting: Oncology

## 2023-08-15 ENCOUNTER — Encounter: Payer: Self-pay | Admitting: Nurse Practitioner

## 2023-08-15 ENCOUNTER — Encounter: Payer: Self-pay | Admitting: Oncology

## 2023-08-15 MED ORDER — SODIUM CHLORIDE 0.9 % IV SOLN
2400.0000 mg/m2 | INTRAVENOUS | Status: DC
  Administered 2023-08-16: 5000 mg via INTRAVENOUS
  Filled 2023-08-15: qty 100

## 2023-08-16 ENCOUNTER — Inpatient Hospital Stay (HOSPITAL_BASED_OUTPATIENT_CLINIC_OR_DEPARTMENT_OTHER): Admitting: Nurse Practitioner

## 2023-08-16 ENCOUNTER — Encounter: Payer: Self-pay | Admitting: Nurse Practitioner

## 2023-08-16 ENCOUNTER — Inpatient Hospital Stay

## 2023-08-16 VITALS — BP 147/82 | HR 74 | Temp 97.8°F | Resp 18 | Ht 63.0 in | Wt 227.2 lb

## 2023-08-16 DIAGNOSIS — Z5111 Encounter for antineoplastic chemotherapy: Secondary | ICD-10-CM | POA: Diagnosis not present

## 2023-08-16 DIAGNOSIS — C259 Malignant neoplasm of pancreas, unspecified: Secondary | ICD-10-CM | POA: Diagnosis not present

## 2023-08-16 DIAGNOSIS — Z95828 Presence of other vascular implants and grafts: Secondary | ICD-10-CM

## 2023-08-16 LAB — CMP (CANCER CENTER ONLY)
ALT: 62 U/L — ABNORMAL HIGH (ref 0–44)
AST: 61 U/L — ABNORMAL HIGH (ref 15–41)
Albumin: 4.3 g/dL (ref 3.5–5.0)
Alkaline Phosphatase: 251 U/L — ABNORMAL HIGH (ref 38–126)
Anion gap: 14 (ref 5–15)
BUN: 10 mg/dL (ref 6–20)
CO2: 24 mmol/L (ref 22–32)
Calcium: 10.3 mg/dL (ref 8.9–10.3)
Chloride: 101 mmol/L (ref 98–111)
Creatinine: 0.72 mg/dL (ref 0.44–1.00)
GFR, Estimated: >60 mL/min (ref >60)
Glucose, Bld: 150 mg/dL — ABNORMAL HIGH (ref 70–99)
Potassium: 3.7 mmol/L (ref 3.5–5.1)
Sodium: 140 mmol/L (ref 135–145)
Total Bilirubin: 0.4 mg/dL (ref 0.0–1.2)
Total Protein: 7.3 g/dL (ref 6.5–8.1)

## 2023-08-16 LAB — CBC WITH DIFFERENTIAL (CANCER CENTER ONLY)
Abs Immature Granulocytes: 0.17 10*3/uL — ABNORMAL HIGH (ref 0.00–0.07)
Basophils Absolute: 0 10*3/uL (ref 0.0–0.1)
Basophils Relative: 1 %
Eosinophils Absolute: 0.1 10*3/uL (ref 0.0–0.5)
Eosinophils Relative: 2 %
HCT: 31.8 % — ABNORMAL LOW (ref 36.0–46.0)
Hemoglobin: 10.6 g/dL — ABNORMAL LOW (ref 12.0–15.0)
Immature Granulocytes: 3 %
Lymphocytes Relative: 26 %
Lymphs Abs: 1.6 10*3/uL (ref 0.7–4.0)
MCH: 34.3 pg — ABNORMAL HIGH (ref 26.0–34.0)
MCHC: 33.3 g/dL (ref 30.0–36.0)
MCV: 102.9 fL — ABNORMAL HIGH (ref 80.0–100.0)
Monocytes Absolute: 0.5 10*3/uL (ref 0.1–1.0)
Monocytes Relative: 7 %
Neutro Abs: 3.8 10*3/uL (ref 1.7–7.7)
Neutrophils Relative %: 61 %
Platelet Count: 137 10*3/uL — ABNORMAL LOW (ref 150–400)
RBC: 3.09 MIL/uL — ABNORMAL LOW (ref 3.87–5.11)
RDW: 18.3 % — ABNORMAL HIGH (ref 11.5–15.5)
WBC Count: 6.2 10*3/uL (ref 4.0–10.5)
nRBC: 0 % (ref 0.0–0.2)

## 2023-08-16 LAB — HCG, SERUM, QUALITATIVE: Preg, Serum: NEGATIVE

## 2023-08-16 MED ORDER — SODIUM CHLORIDE 0.9 % IV SOLN
150.0000 mg | Freq: Once | INTRAVENOUS | Status: AC
  Administered 2023-08-16: 150 mg via INTRAVENOUS
  Filled 2023-08-16: qty 150

## 2023-08-16 MED ORDER — PALONOSETRON HCL INJECTION 0.25 MG/5ML
0.2500 mg | Freq: Once | INTRAVENOUS | Status: AC
  Administered 2023-08-16: 0.25 mg via INTRAVENOUS
  Filled 2023-08-16: qty 5

## 2023-08-16 MED ORDER — ATROPINE SULFATE 1 MG/ML IV SOLN
0.5000 mg | Freq: Once | INTRAVENOUS | Status: AC | PRN
  Administered 2023-08-16: 0.5 mg via INTRAVENOUS
  Filled 2023-08-16: qty 1

## 2023-08-16 MED ORDER — SODIUM CHLORIDE 0.9% FLUSH
10.0000 mL | INTRAVENOUS | Status: DC | PRN
  Administered 2023-08-16: 10 mL

## 2023-08-16 MED ORDER — SODIUM CHLORIDE 0.9 % IV SOLN
400.0000 mg/m2 | Freq: Once | INTRAVENOUS | Status: AC
  Administered 2023-08-16: 856 mg via INTRAVENOUS
  Filled 2023-08-16: qty 42.8

## 2023-08-16 MED ORDER — SODIUM CHLORIDE 0.9 % IV SOLN
120.0000 mg/m2 | Freq: Once | INTRAVENOUS | Status: AC
  Administered 2023-08-16: 260 mg via INTRAVENOUS
  Filled 2023-08-16: qty 13

## 2023-08-16 MED ORDER — DEXTROSE 5 % IV SOLN: INTRAVENOUS | Status: DC

## 2023-08-16 MED ORDER — DEXAMETHASONE SODIUM PHOSPHATE 10 MG/ML IJ SOLN
10.0000 mg | Freq: Once | INTRAMUSCULAR | Status: AC
  Administered 2023-08-16: 10 mg via INTRAVENOUS
  Filled 2023-08-16: qty 1

## 2023-08-16 NOTE — Progress Notes (Signed)
 Patient seen by Diana Forster NP today  Vitals are within treatment parameters:Yes   Labs are within treatment parameters: Yes   Treatment plan has been signed: Yes   Per physician team, Patient is ready for treatment. Please note the following modifications:no oxaliplatin 

## 2023-08-16 NOTE — Progress Notes (Signed)
 [facility] OFFICE PROGRESS NOTE   Diagnosis: Pancreas cancer  INTERVAL HISTORY:   Debbie [name] returns as scheduled.  She completed cycle 8 FOLFIRINOX 07/31/2023.  Oxaliplatin  was held due to neuropathy.  She has persistent constant numbness/tingling tingling in both hands left greater than right.  She notes difficulty holding items with the left hand.  No numbness or tingling in the feet.  No nausea or vomiting.  No mouth sores.  She noted less diarrhea after the most recent chemotherapy.  Objective:  Vital signs in last 24 hours:  Blood pressure (!) 147/82, pulse 74, temperature 97.8 F (36.6 C), temperature source Temporal, resp. rate 18, height 5\' 3"  (1.6 m), weight 227 lb 3.2 oz (103.1 kg), SpO2 100%.    HEENT: No thrush or ulcers. Resp: Lungs clear bilaterally. Cardio: Regular rate and rhythm. GI: Abdomen soft and nontender.  No hepatosplenomegaly.  No mass. Vascular: No leg edema. Neuro: Vibratory sense mildly decreased over the fingertips per tuning fork exam. Skin: Palms without erythema. Port-A-Cath without erythema.   Lab Results:  Lab Results  Component Value Date   WBC 6.2 08/16/2023   HGB 10.6 (L) 08/16/2023   HCT 31.8 (L) 08/16/2023   MCV 102.9 (H) 08/16/2023   PLT 137 (L) 08/16/2023   NEUTROABS 3.8 08/16/2023    Imaging:  No results found.  Medications: I have reviewed the patient's current medications.  Assessment/Plan: Pancreas cancer CT abdomen/pelvis 03/24/2023: 3 x 4.6 cm pancreas tail mass, multiple liver lesions consistent with metastases, fat stranding surrounding the second and third ports of the duodenum and pancreas head/uncinate  CT chest 03/27/2023: New spiculated nodular density in the right lower lobe, 8 x 4 mm EUS 03/27/2023: Pancreas tail mass, liver lesions, T2 N0 M1, needle biopsy of pancreas mass, adenocarcinoma Foundation 1: K-ras G 12R, HRD signature negative, MSS, tumor mutation burden 0, PD-L1 TPS 1% Elevated CA 19-9  (121,720 on 03/26/2023) Cycle 1 FOLFIRINOX 04/10/2023 Chemotherapy held 04/24/2023 due to neutropenia Cycle 2 FOLFIRINOX 05/04/2023, Udenyca  Cycle 3 FOLFIRINOX 05/22/2023, Udenyca  Cycle 4 FOLFIRINOX 06/05/2023, Udenyca  Cycle 5 FOLFIRINOX 06/19/2023, Udenyca  CTs 06/26/2023: Stable pancreas mass, slight increase in size and number of hepatic metastases, improved ill-defined right lower lobe nodule, new nonspecific small groundglass nodules, improved stranding at the pancreas head/neck (review of CTs with patient-pancreas mass appears slightly smaller, several liver lesions are slightly larger others measured stable or slightly smaller Cycle 6 FOLFIRINOX 07/04/2023, Udenyca  Cycle 7 FOLFIRINOX 07/17/2023, Udenyca ; oxaliplatin  held due to neuropathy and thrombocytopenia Cycle 8 FOLFIRINOX 07/31/2023, Udenyca ; oxaliplatin  held due to neuropathy, irinotecan  dose reduced due to elevated AST/ALT. CTs 08/09/2023-improvement in hepatic metastases.  Decrease in size of pancreatic tail mass.  No evidence of metastatic disease within the chest. Cycle 9 FOLFIRINOX 08/16/2023, Udenyca ; oxaliplatin  held due to neuropathy   2.  Diabetes 3.  Acute abdominal pain and nausea/vomiting 03/23/2023 secondary to pancreatitis.  Improved 03/31/2023 4.  Hypertension. 5.  Asthma 6.  Hypothyroidism 7.  Hyperlipidemia 8.  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  She has completed 8 cycles of FOLFIRINOX.  Oxaliplatin  was placed on hold beginning with cycle 7 due to persistent neuropathy symptoms interfering with activity.  Recent CTs show improvement in liver metastases and the pancreatic tail mass.  We reviewed the results/images with Debbie [name] and her mother at today's visit.  Her husband was present by phone.  She understands and agrees with the recommendation to continue  systemic therapy, cycle 9 today.  Oxaliplatin  will remain on hold.  CBC  and chemistry panel reviewed.  Labs adequate to proceed as above.  AST/ALT improved.  She will return for follow-up and cycle 10 FOLFIRINOX in 2 weeks.  She will contact the office in the interim with any problems.  Patient seen with Dr. Scherrie Curt.    Diana Forster ANP/GNP-BC   08/16/2023  9:52 AM  This was a shared visit with Diana Forster.  Debbie [name] has completed 8 cycles of systemic therapy.  The CA 19-9 is lower.  We reviewed the restaging CT findings and images with her.  The CTs are consistent with a response to therapy.  The plan is to continue FOLFIRINOX.  Oxaliplatin  will remain on hold due to neuropathy in the left hand.  I was present for greater than 50% of today's visit.  I performed medical decision making.  Anise Kerns, MD

## 2023-08-16 NOTE — Patient Instructions (Signed)
 [facility] - A DEPT OF [facility]  Discharge Instructions: Thank you for choosing [facility] to provide your oncology and hematology care.   If you have a lab appointment with the Cancer Center, please go directly to the Cancer Center and check in at the registration area.   Wear comfortable clothing and clothing appropriate for easy access to any Portacath or PICC line.   We strive to give you quality time with your provider. You may need to reschedule your appointment if you arrive late (15 or more minutes).  Arriving late affects you and other patients whose appointments are after yours.  Also, if you miss three or more appointments without notifying the office, you may be dismissed from the clinic at the provider's discretion.      For prescription refill requests, have your pharmacy contact our office and allow 72 hours for refills to be completed.    Today you received the following chemotherapy and/or immunotherapy agents :  Irinotecan,  Leucovorin,  Fluorouracil.   To help prevent nausea and vomiting after your treatment, we encourage you to take your nausea medication as directed.  BELOW ARE SYMPTOMS THAT SHOULD BE REPORTED IMMEDIATELY: *FEVER GREATER THAN 100.4 F (38 C) OR HIGHER *CHILLS OR SWEATING *NAUSEA AND VOMITING THAT IS NOT CONTROLLED WITH YOUR NAUSEA MEDICATION *UNUSUAL SHORTNESS OF BREATH *UNUSUAL BRUISING OR BLEEDING *URINARY PROBLEMS (pain or burning when urinating, or frequent urination) *BOWEL PROBLEMS (unusual diarrhea, constipation, pain near the anus) TENDERNESS IN MOUTH AND THROAT WITH OR WITHOUT PRESENCE OF ULCERS (sore throat, sores in mouth, or a toothache) UNUSUAL RASH, SWELLING OR PAIN  UNUSUAL VAGINAL DISCHARGE OR ITCHING   Items with * indicate a potential emergency and should be followed up as soon as possible or go to the Emergency Department if any problems should occur.  Please show the CHEMOTHERAPY  ALERT CARD or IMMUNOTHERAPY ALERT CARD at check-in to the Emergency Department and triage nurse.  Should you have questions after your visit or need to cancel or reschedule your appointment, please contact [facility] - A DEPT OF [facility]  Dept: [phone]  and follow the prompts.  Office hours are 8:00 a.m. to 4:30 p.m. Monday - Friday. Please note that voicemails left after 4:00 p.m. may not be returned until the following business day.  We are closed weekends and major holidays. You have access to a nurse at all times for urgent questions. Please call the main number to the clinic Dept: [phone] and follow the prompts.   For any non-urgent questions, you may also contact your provider using MyChart. We now offer e-Visits for anyone 52 and older to request care online for non-urgent symptoms. For details visit [email or web address].   Also download the MyChart app! Go to the app store, search "MyChart", open the app, select [facility], and log in with your MyChart username and password.

## 2023-08-16 NOTE — Patient Instructions (Signed)

## 2023-08-17 ENCOUNTER — Other Ambulatory Visit: Payer: Self-pay

## 2023-08-17 LAB — CANCER ANTIGEN 19-9: CA 19-9: 47932 U/mL — ABNORMAL HIGH (ref 0–35)

## 2023-08-18 ENCOUNTER — Inpatient Hospital Stay

## 2023-08-18 VITALS — BP 132/83 | HR 66 | Temp 98.8°F

## 2023-08-18 DIAGNOSIS — C259 Malignant neoplasm of pancreas, unspecified: Secondary | ICD-10-CM

## 2023-08-18 DIAGNOSIS — Z5111 Encounter for antineoplastic chemotherapy: Secondary | ICD-10-CM | POA: Diagnosis not present

## 2023-08-18 MED ORDER — PEGFILGRASTIM-CBQV 6 MG/0.6ML ~~LOC~~ SOSY
6.0000 mg | PREFILLED_SYRINGE | Freq: Once | SUBCUTANEOUS | Status: AC
  Administered 2023-08-18: 6 mg via SUBCUTANEOUS
  Filled 2023-08-18: qty 0.6

## 2023-08-18 MED ORDER — SODIUM CHLORIDE 0.9% FLUSH
10.0000 mL | INTRAVENOUS | Status: DC | PRN
  Administered 2023-08-18: 10 mL

## 2023-08-18 MED ORDER — HEPARIN SOD (PORK) LOCK FLUSH 100 UNIT/ML IV SOLN
500.0000 [IU] | Freq: Once | INTRAVENOUS | Status: AC | PRN
  Administered 2023-08-18: 500 [IU]

## 2023-08-18 NOTE — Patient Instructions (Signed)

## 2023-08-27 ENCOUNTER — Other Ambulatory Visit: Payer: Self-pay | Admitting: Oncology

## 2023-08-27 DIAGNOSIS — C259 Malignant neoplasm of pancreas, unspecified: Secondary | ICD-10-CM

## 2023-08-29 ENCOUNTER — Encounter: Payer: Self-pay | Admitting: Oncology

## 2023-08-29 ENCOUNTER — Inpatient Hospital Stay

## 2023-08-29 ENCOUNTER — Inpatient Hospital Stay: Attending: Nurse Practitioner

## 2023-08-29 ENCOUNTER — Inpatient Hospital Stay (HOSPITAL_BASED_OUTPATIENT_CLINIC_OR_DEPARTMENT_OTHER): Admitting: Oncology

## 2023-08-29 ENCOUNTER — Other Ambulatory Visit: Payer: Self-pay

## 2023-08-29 ENCOUNTER — Encounter: Payer: 59 | Admitting: Internal Medicine

## 2023-08-29 VITALS — BP 125/91 | HR 69 | Temp 98.1°F | Resp 18 | Ht 63.0 in | Wt 223.4 lb

## 2023-08-29 VITALS — BP 126/78 | HR 68 | Temp 98.2°F | Resp 16

## 2023-08-29 DIAGNOSIS — Z79634 Long term (current) use of topoisomerase inhibitor: Secondary | ICD-10-CM | POA: Diagnosis not present

## 2023-08-29 DIAGNOSIS — C252 Malignant neoplasm of tail of pancreas: Secondary | ICD-10-CM | POA: Insufficient documentation

## 2023-08-29 DIAGNOSIS — C787 Secondary malignant neoplasm of liver and intrahepatic bile duct: Secondary | ICD-10-CM | POA: Insufficient documentation

## 2023-08-29 DIAGNOSIS — Z95828 Presence of other vascular implants and grafts: Secondary | ICD-10-CM

## 2023-08-29 DIAGNOSIS — Z5189 Encounter for other specified aftercare: Secondary | ICD-10-CM | POA: Insufficient documentation

## 2023-08-29 DIAGNOSIS — Z79631 Long term (current) use of antimetabolite agent: Secondary | ICD-10-CM | POA: Insufficient documentation

## 2023-08-29 DIAGNOSIS — C259 Malignant neoplasm of pancreas, unspecified: Secondary | ICD-10-CM

## 2023-08-29 DIAGNOSIS — Z5111 Encounter for antineoplastic chemotherapy: Secondary | ICD-10-CM | POA: Diagnosis present

## 2023-08-29 LAB — CBC WITH DIFFERENTIAL (CANCER CENTER ONLY)
Abs Immature Granulocytes: 0.02 10*3/uL (ref 0.00–0.07)
Basophils Absolute: 0 10*3/uL (ref 0.0–0.1)
Basophils Relative: 0 %
Eosinophils Absolute: 0.2 10*3/uL (ref 0.0–0.5)
Eosinophils Relative: 4 %
HCT: 32.9 % — ABNORMAL LOW (ref 36.0–46.0)
Hemoglobin: 11 g/dL — ABNORMAL LOW (ref 12.0–15.0)
Immature Granulocytes: 0 %
Lymphocytes Relative: 29 %
Lymphs Abs: 1.3 10*3/uL (ref 0.7–4.0)
MCH: 34.5 pg — ABNORMAL HIGH (ref 26.0–34.0)
MCHC: 33.4 g/dL (ref 30.0–36.0)
MCV: 103.1 fL — ABNORMAL HIGH (ref 80.0–100.0)
Monocytes Absolute: 0.4 10*3/uL (ref 0.1–1.0)
Monocytes Relative: 9 %
Neutro Abs: 2.7 10*3/uL (ref 1.7–7.7)
Neutrophils Relative %: 58 %
Platelet Count: 138 10*3/uL — ABNORMAL LOW (ref 150–400)
RBC: 3.19 MIL/uL — ABNORMAL LOW (ref 3.87–5.11)
RDW: 16.2 % — ABNORMAL HIGH (ref 11.5–15.5)
WBC Count: 4.6 10*3/uL (ref 4.0–10.5)
nRBC: 0 % (ref 0.0–0.2)

## 2023-08-29 LAB — CMP (CANCER CENTER ONLY)
ALT: 47 U/L — ABNORMAL HIGH (ref 0–44)
AST: 45 U/L — ABNORMAL HIGH (ref 15–41)
Albumin: 4.3 g/dL (ref 3.5–5.0)
Alkaline Phosphatase: 207 U/L — ABNORMAL HIGH (ref 38–126)
Anion gap: 14 (ref 5–15)
BUN: 15 mg/dL (ref 6–20)
CO2: 24 mmol/L (ref 22–32)
Calcium: 10.3 mg/dL (ref 8.9–10.3)
Chloride: 101 mmol/L (ref 98–111)
Creatinine: 0.66 mg/dL (ref 0.44–1.00)
GFR, Estimated: >60 mL/min (ref >60)
Glucose, Bld: 128 mg/dL — ABNORMAL HIGH (ref 70–99)
Potassium: 3.6 mmol/L (ref 3.5–5.1)
Sodium: 139 mmol/L (ref 135–145)
Total Bilirubin: 0.3 mg/dL (ref 0.0–1.2)
Total Protein: 7.5 g/dL (ref 6.5–8.1)

## 2023-08-29 LAB — HCG, SERUM, QUALITATIVE: Preg, Serum: NEGATIVE

## 2023-08-29 MED ORDER — ATROPINE SULFATE 1 MG/ML IV SOLN
0.5000 mg | Freq: Once | INTRAVENOUS | Status: AC | PRN
  Administered 2023-08-29: 0.5 mg via INTRAVENOUS
  Filled 2023-08-29: qty 1

## 2023-08-29 MED ORDER — SODIUM CHLORIDE 0.9 % IV SOLN
120.0000 mg/m2 | Freq: Once | INTRAVENOUS | Status: AC
  Administered 2023-08-29: 260 mg via INTRAVENOUS
  Filled 2023-08-29: qty 13

## 2023-08-29 MED ORDER — PALONOSETRON HCL INJECTION 0.25 MG/5ML
0.2500 mg | Freq: Once | INTRAVENOUS | Status: AC
  Administered 2023-08-29: 0.25 mg via INTRAVENOUS
  Filled 2023-08-29: qty 5

## 2023-08-29 MED ORDER — SODIUM CHLORIDE 0.9 % IV SOLN
2400.0000 mg/m2 | INTRAVENOUS | Status: DC
  Administered 2023-08-29: 5000 mg via INTRAVENOUS
  Filled 2023-08-29: qty 100

## 2023-08-29 MED ORDER — DEXAMETHASONE SODIUM PHOSPHATE 10 MG/ML IJ SOLN
10.0000 mg | Freq: Once | INTRAMUSCULAR | Status: AC
  Administered 2023-08-29: 10 mg via INTRAVENOUS
  Filled 2023-08-29: qty 1

## 2023-08-29 MED ORDER — SODIUM CHLORIDE 0.9% FLUSH
10.0000 mL | Freq: Once | INTRAVENOUS | Status: AC
  Administered 2023-08-29: 10 mL via INTRAVENOUS

## 2023-08-29 MED ORDER — SODIUM CHLORIDE 0.9 % IV SOLN
400.0000 mg/m2 | Freq: Once | INTRAVENOUS | Status: AC
  Administered 2023-08-29: 856 mg via INTRAVENOUS
  Filled 2023-08-29: qty 42.8

## 2023-08-29 MED ORDER — SODIUM CHLORIDE 0.9 % IV SOLN
150.0000 mg | Freq: Once | INTRAVENOUS | Status: AC
  Administered 2023-08-29: 150 mg via INTRAVENOUS
  Filled 2023-08-29: qty 150

## 2023-08-29 MED ORDER — SODIUM CHLORIDE 0.9 % IV SOLN: INTRAVENOUS | Status: DC

## 2023-08-29 NOTE — Progress Notes (Signed)
 [facility] OFFICE PROGRESS NOTE   Diagnosis: Pancreas cancer  INTERVAL HISTORY:   Debbie [name] returns as scheduled.  She completed another cycle of chemotherapy on 08/16/2023.  She reports mild diarrhea following chemotherapy.  She continues to have numbness in the left hand, though the numbness is now more distal.  She has cold sensitivity in the feet.  She had low back pain yesterday evening.  This has resolved.  She has a poor appetite following chemotherapy.  The appetite improves during the week following chemotherapy.  Objective:  Vital signs in last 24 hours:  Blood pressure (!) 125/91, pulse 69, temperature 98.1 F (36.7 C), temperature source Temporal, resp. rate 18, height 5\' 3"  (1.6 m), weight 223 lb 6.4 oz (101.3 kg), SpO2 98%.    HEENT: No thrush or ulcers Resp: Lungs clear bilaterally Cardio: Regular rate and rhythm GI: No hepatosplenomegaly, no mass, nontender Vascular: Edema    Portacath/PICC-without erythema  Lab Results:  Lab Results  Component Value Date   WBC 4.6 08/29/2023   HGB 11.0 (L) 08/29/2023   HCT 32.9 (L) 08/29/2023   MCV 103.1 (H) 08/29/2023   PLT 138 (L) 08/29/2023   NEUTROABS 2.7 08/29/2023    CMP  Lab Results  Component Value Date   NA 139 08/29/2023   K 3.6 08/29/2023   CL 101 08/29/2023   CO2 24 08/29/2023   GLUCOSE 128 (H) 08/29/2023   BUN 15 08/29/2023   CREATININE 0.66 08/29/2023   CALCIUM  10.3 08/29/2023   PROT 7.5 08/29/2023   ALBUMIN 4.3 08/29/2023   AST 45 (H) 08/29/2023   ALT 47 (H) 08/29/2023   ALKPHOS 207 (H) 08/29/2023   BILITOT 0.3 08/29/2023   GFRNONAA >60 08/29/2023   GFRAA >60 10/03/2015    Lab Results  Component Value Date   MVH846 47,932 (H) 08/16/2023    Medications: I have reviewed the patient's current medications.   Assessment/Plan: Pancreas cancer CT abdomen/pelvis 03/24/2023: 3 x 4.6 cm pancreas tail mass, multiple liver lesions consistent with metastases, fat stranding  surrounding the second and third ports of the duodenum and pancreas head/uncinate  CT chest 03/27/2023: New spiculated nodular density in the right lower lobe, 8 x 4 mm EUS 03/27/2023: Pancreas tail mass, liver lesions, T2 N0 M1, needle biopsy of pancreas mass, adenocarcinoma Foundation 1: K-ras G 12R, HRD signature negative, MSS, tumor mutation burden 0, PD-L1 TPS 1% Elevated CA 19-9 (121,720 on 03/26/2023) Cycle 1 FOLFIRINOX 04/10/2023 Chemotherapy held 04/24/2023 due to neutropenia Cycle 2 FOLFIRINOX 05/04/2023, Udenyca  Cycle 3 FOLFIRINOX 05/22/2023, Udenyca  Cycle 4 FOLFIRINOX 06/05/2023, Udenyca  Cycle 5 FOLFIRINOX 06/19/2023, Udenyca  CTs 06/26/2023: Stable pancreas mass, slight increase in size and number of hepatic metastases, improved ill-defined right lower lobe nodule, new nonspecific small groundglass nodules, improved stranding at the pancreas head/neck (review of CTs with patient-pancreas mass appears slightly smaller, several liver lesions are slightly larger others measured stable or slightly smaller Cycle 6 FOLFIRINOX 07/04/2023, Udenyca  Cycle 7 FOLFIRINOX 07/17/2023, Udenyca ; oxaliplatin  held due to neuropathy and thrombocytopenia Cycle 8 FOLFIRINOX 07/31/2023, Udenyca ; oxaliplatin  held due to neuropathy, irinotecan  dose reduced due to elevated AST/ALT. CTs 08/09/2023-improvement in hepatic metastases.  Decrease in size of pancreatic tail mass.  No evidence of metastatic disease within the chest. Cycle 9 FOLFIRINOX 08/16/2023, Udenyca ; oxaliplatin  held due to neuropathy Cycle 10 FOLFIRINOX 08/29/2023, Udenyca , oxaliplatin  held due to neuropathy   2.  Diabetes 3.  Acute abdominal pain and nausea/vomiting 03/23/2023 secondary to pancreatitis.  Improved 03/31/2023 4.  Hypertension. 5.  Asthma 6.  Hypothyroidism 7.  Hyperlipidemia 8.  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  She will complete another  cycle of FOLFIRINOX today.  She will return for an office visit and chemotherapy in 2 weeks.  Coni Deep, MD  08/29/2023  9:01 AM

## 2023-08-29 NOTE — Progress Notes (Signed)
 Patient seen by Dr. Coni Deep today  Vitals are within treatment parameters:No (Please specify and give further instructions.)B/P 125/91 It's okay to proceed  Labs are within treatment parameters: Yes   Treatment plan has been signed: Yes   Per physician team, Patient is ready for treatment and there are NO modifications to the treatment plan.

## 2023-08-29 NOTE — Patient Instructions (Addendum)
 [facility] - A DEPT OF [facility]  Discharge Instructions: Thank you for choosing [facility] to provide your oncology and hematology care.   If you have a lab appointment with the Cancer Center, please go directly to the Cancer Center and check in at the registration area.   Wear comfortable clothing and clothing appropriate for easy access to any Portacath or PICC line.   We strive to give you quality time with your provider. You may need to reschedule your appointment if you arrive late (15 or more minutes).  Arriving late affects you and other patients whose appointments are after yours.  Also, if you miss three or more appointments without notifying the office, you may be dismissed from the clinic at the provider's discretion.      For prescription refill requests, have your pharmacy contact our office and allow 72 hours for refills to be completed.    Today you received the following chemotherapy and/or immunotherapy agents Irinotecan , Leucovorin , and Adrucil .    Irinotecan  Injection What is this medication? IRINOTECAN  (ir in oh TEE kan) treats some types of cancer. It works by slowing down the growth of cancer cells. This medicine may be used for other purposes; ask your health care provider or pharmacist if you have questions. COMMON BRAND NAME(S): Camptosar  What should I tell my care team before I take this medication? They need to know if you have any of these conditions: Dehydration Diarrhea Infection, especially a viral infection, such as chickenpox, cold sores, herpes Liver disease Low blood cell levels (white cells, red cells, and platelets) Low levels of electrolytes, such as calcium , magnesium , or potassium in your blood Recent or ongoing radiation An unusual or allergic reaction to irinotecan , other medications, foods, dyes, or preservatives If you or your partner are pregnant or trying to get pregnant Breast-feeding How  should I use this medication? This medication is injected into a vein. It is given by your care team in a hospital or clinic setting. Talk to your care team about the use of this medication in children. Special care may be needed. Overdosage: If you think you have taken too much of this medicine contact a poison control center or emergency room at once. NOTE: This medicine is only for you. Do not share this medicine with others. What if I miss a dose? Keep appointments for follow-up doses. It is important not to miss your dose. Call your care team if you are unable to keep an appointment. What may interact with this medication? Do not take this medication with any of the following: Cobicistat Itraconazole This medication may also interact with the following: Certain antibiotics, such as clarithromycin, rifampin, rifabutin Certain antivirals for HIV or AIDS Certain medications for fungal infections, such as ketoconazole , posaconazole, voriconazole Certain medications for seizures, such as carbamazepine, phenobarbital, phenytoin Gemfibrozil Nefazodone St. John's wort This list may not describe all possible interactions. Give your health care provider a list of all the medicines, herbs, non-prescription drugs, or dietary supplements you use. Also tell them if you smoke, drink alcohol, or use illegal drugs. Some items may interact with your medicine. What should I watch for while using this medication? Your condition will be monitored carefully while you are receiving this medication. You may need blood work while taking this medication. This medication may make you feel generally unwell. This is not uncommon as chemotherapy can affect healthy cells as well as cancer cells. Report any side effects.  Continue your course of treatment even though you feel ill unless your care team tells you to stop. This medication can cause serious side effects. To reduce the risk, your care team may give you other  medications to take before receiving this one. Be sure to follow the directions from your care team. This medication may affect your coordination, reaction time, or judgement. Do not drive or operate machinery until you know how this medication affects you. Sit up or stand slowly to reduce the risk of dizzy or fainting spells. Drinking alcohol with this medication can increase the risk of these side effects. This medication may increase your risk of getting an infection. Call your care team for advice if you get a fever, chills, sore throat, or other symptoms of a cold or flu. Do not treat yourself. Try to avoid being around people who are sick. Avoid taking medications that contain aspirin, acetaminophen , ibuprofen, naproxen, or ketoprofen unless instructed by your care team. These medications may hide a fever. This medication may increase your risk to bruise or bleed. Call your care team if you notice any unusual bleeding. Be careful brushing or flossing your teeth or using a toothpick because you may get an infection or bleed more easily. If you have any dental work done, tell your dentist you are receiving this medication. Talk to your care team if you or your partner are pregnant or think either of you might be pregnant. This medication can cause serious birth defects if taken during pregnancy and for 6 months after the last dose. You will need a negative pregnancy test before starting this medication. Contraception is recommended while taking this medication and for 6 months after the last dose. Your care team can help you find the option that works for you. Do not father a child while taking this medication and for 3 months after the last dose. Use a condom for contraception during this time period. Do not breastfeed while taking this medication and for 7 days after the last dose. This medication may cause infertility. Talk to your care team if you are concerned about your fertility. What side  effects may I notice from receiving this medication? Side effects that you should report to your care team as soon as possible: Allergic reactions--skin rash, itching, hives, swelling of the face, lips, tongue, or throat Dry cough, shortness of breath or trouble breathing Increased saliva or tears, increased sweating, stomach cramping, diarrhea, small pupils, unusual weakness or fatigue, slow heartbeat Infection--fever, chills, cough, sore throat, wounds that don't heal, pain or trouble when passing urine, general feeling of discomfort or being unwell Kidney injury--decrease in the amount of urine, swelling of the ankles, hands, or feet Low red blood cell level--unusual weakness or fatigue, dizziness, headache, trouble breathing Severe or prolonged diarrhea Unusual bruising or bleeding Side effects that usually do not require medical attention (report to your care team if they continue or are bothersome): Constipation Diarrhea Hair loss Loss of appetite Nausea Stomach pain This list may not describe all possible side effects. Call your doctor for medical advice about side effects. You may report side effects to FDA at 1-800-FDA-1088. Where should I keep my medication? This medication is given in a hospital or clinic. It will not be stored at home. NOTE: This sheet is a summary. It may not cover all possible information. If you have questions about this medicine, talk to your doctor, pharmacist, or health care provider.  2024 Elsevier/Gold Standard (2021-07-19 00:00:00)  Leucovorin   Injection What is this medication? LEUCOVORIN  (loo koe VOR in) prevents side effects from certain medications, such as methotrexate. It works by increasing folate levels. This helps protect healthy cells in your body. It may also be used to treat anemia caused by low levels of folate. It can also be used with fluorouracil , a type of chemotherapy, to treat colorectal cancer. It works by increasing the effects of  fluorouracil  in the body. This medicine may be used for other purposes; ask your health care provider or pharmacist if you have questions. What should I tell my care team before I take this medication? They need to know if you have any of these conditions: Anemia from low levels of vitamin B12 in the blood An unusual or allergic reaction to leucovorin , folic acid, other medications, foods, dyes, or preservatives Pregnant or trying to get pregnant Breastfeeding How should I use this medication? This medication is injected into a vein or a muscle. It is given by your care team in a hospital or clinic setting. Talk to your care team about the use of this medication in children. Special care may be needed. Overdosage: If you think you have taken too much of this medicine contact a poison control center or emergency room at once. NOTE: This medicine is only for you. Do not share this medicine with others. What if I miss a dose? Keep appointments for follow-up doses. It is important not to miss your dose. Call your care team if you are unable to keep an appointment. What may interact with this medication? Capecitabine Fluorouracil  Phenobarbital Phenytoin Primidone Trimethoprim;sulfamethoxazole This list may not describe all possible interactions. Give your health care provider a list of all the medicines, herbs, non-prescription drugs, or dietary supplements you use. Also tell them if you smoke, drink alcohol, or use illegal drugs. Some items may interact with your medicine. What should I watch for while using this medication? Your condition will be monitored carefully while you are receiving this medication. This medication may increase the side effects of 5-fluorouracil . Tell your care team if you have diarrhea or mouth sores that do not get better or that get worse. What side effects may I notice from receiving this medication? Side effects that you should report to your care team as soon as  possible: Allergic reactions--skin rash, itching, hives, swelling of the face, lips, tongue, or throat This list may not describe all possible side effects. Call your doctor for medical advice about side effects. You may report side effects to FDA at 1-800-FDA-1088. Where should I keep my medication? This medication is given in a hospital or clinic. It will not be stored at home. NOTE: This sheet is a summary. It may not cover all possible information. If you have questions about this medicine, talk to your doctor, pharmacist, or health care provider.  2024 Elsevier/Gold Standard (2021-08-10 00:00:00)Fluorouracil  Injection What is this medication? FLUOROURACIL  (flure oh YOOR a sil) treats some types of cancer. It works by slowing down the growth of cancer cells. This medicine may be used for other purposes; ask your health care provider or pharmacist if you have questions. COMMON BRAND NAME(S): Adrucil  What should I tell my care team before I take this medication? They need to know if you have any of these conditions: Blood disorders Dihydropyrimidine dehydrogenase (DPD) deficiency Infection, such as chickenpox, cold sores, herpes Kidney disease Liver disease Poor nutrition Recent or ongoing radiation therapy An unusual or allergic reaction to fluorouracil , other medications, foods, dyes, or  preservatives If you or your partner are pregnant or trying to get pregnant Breast-feeding How should I use this medication? This medication is injected into a vein. It is administered by your care team in a hospital or clinic setting. Talk to your care team about the use of this medication in children. Special care may be needed. Overdosage: If you think you have taken too much of this medicine contact a poison control center or emergency room at once. NOTE: This medicine is only for you. Do not share this medicine with others. What if I miss a dose? Keep appointments for follow-up doses. It is  important not to miss your dose. Call your care team if you are unable to keep an appointment. What may interact with this medication? Do not take this medication with any of the following: Live virus vaccines This medication may also interact with the following: Medications that treat or prevent blood clots, such as warfarin, enoxaparin , dalteparin This list may not describe all possible interactions. Give your health care provider a list of all the medicines, herbs, non-prescription drugs, or dietary supplements you use. Also tell them if you smoke, drink alcohol, or use illegal drugs. Some items may interact with your medicine. What should I watch for while using this medication? Your condition will be monitored carefully while you are receiving this medication. This medication may make you feel generally unwell. This is not uncommon as chemotherapy can affect healthy cells as well as cancer cells. Report any side effects. Continue your course of treatment even though you feel ill unless your care team tells you to stop. In some cases, you may be given additional medications to help with side effects. Follow all directions for their use. This medication may increase your risk of getting an infection. Call your care team for advice if you get a fever, chills, sore throat, or other symptoms of a cold or flu. Do not treat yourself. Try to avoid being around people who are sick. This medication may increase your risk to bruise or bleed. Call your care team if you notice any unusual bleeding. Be careful brushing or flossing your teeth or using a toothpick because you may get an infection or bleed more easily. If you have any dental work done, tell your dentist you are receiving this medication. Avoid taking medications that contain aspirin, acetaminophen , ibuprofen, naproxen, or ketoprofen unless instructed by your care team. These medications may hide a fever. Do not treat diarrhea with over the  counter products. Contact your care team if you have diarrhea that lasts more than 2 days or if it is severe and watery. This medication can make you more sensitive to the sun. Keep out of the sun. If you cannot avoid being in the sun, wear protective clothing and sunscreen. Do not use sun lamps, tanning beds, or tanning booths. Talk to your care team if you or your partner wish to become pregnant or think you might be pregnant. This medication can cause serious birth defects if taken during pregnancy and for 3 months after the last dose. A reliable form of contraception is recommended while taking this medication and for 3 months after the last dose. Talk to your care team about effective forms of contraception. Do not father a child while taking this medication and for 3 months after the last dose. Use a condom while having sex during this time period. Do not breastfeed while taking this medication. This medication may cause infertility. Talk to your  care team if you are concerned about your fertility. What side effects may I notice from receiving this medication? Side effects that you should report to your care team as soon as possible: Allergic reactions--skin rash, itching, hives, swelling of the face, lips, tongue, or throat Heart attack--pain or tightness in the chest, shoulders, arms, or jaw, nausea, shortness of breath, cold or clammy skin, feeling faint or lightheaded Heart failure--shortness of breath, swelling of the ankles, feet, or hands, sudden weight gain, unusual weakness or fatigue Heart rhythm changes--fast or irregular heartbeat, dizziness, feeling faint or lightheaded, chest pain, trouble breathing High ammonia level--unusual weakness or fatigue, confusion, loss of appetite, nausea, vomiting, seizures Infection--fever, chills, cough, sore throat, wounds that don't heal, pain or trouble when passing urine, general feeling of discomfort or being unwell Low red blood cell  level--unusual weakness or fatigue, dizziness, headache, trouble breathing Pain, tingling, or numbness in the hands or feet, muscle weakness, change in vision, confusion or trouble speaking, loss of balance or coordination, trouble walking, seizures Redness, swelling, and blistering of the skin over hands and feet Severe or prolonged diarrhea Unusual bruising or bleeding Side effects that usually do not require medical attention (report to your care team if they continue or are bothersome): Dry skin Headache Increased tears Nausea Pain, redness, or swelling with sores inside the mouth or throat Sensitivity to light Vomiting This list may not describe all possible side effects. Call your doctor for medical advice about side effects. You may report side effects to FDA at 1-800-FDA-1088. Where should I keep my medication? This medication is given in a hospital or clinic. It will not be stored at home. NOTE: This sheet is a summary. It may not cover all possible information. If you have questions about this medicine, talk to your doctor, pharmacist, or health care provider.  2024 Elsevier/Gold Standard (2021-07-13 00:00:00)   To help prevent nausea and vomiting after your treatment, we encourage you to take your nausea medication as directed.  BELOW ARE SYMPTOMS THAT SHOULD BE REPORTED IMMEDIATELY: *FEVER GREATER THAN 100.4 F (38 C) OR HIGHER *CHILLS OR SWEATING *NAUSEA AND VOMITING THAT IS NOT CONTROLLED WITH YOUR NAUSEA MEDICATION *UNUSUAL SHORTNESS OF BREATH *UNUSUAL BRUISING OR BLEEDING *URINARY PROBLEMS (pain or burning when urinating, or frequent urination) *BOWEL PROBLEMS (unusual diarrhea, constipation, pain near the anus) TENDERNESS IN MOUTH AND THROAT WITH OR WITHOUT PRESENCE OF ULCERS (sore throat, sores in mouth, or a toothache) UNUSUAL RASH, SWELLING OR PAIN  UNUSUAL VAGINAL DISCHARGE OR ITCHING   Items with * indicate a potential emergency and should be followed up as  soon as possible or go to the Emergency Department if any problems should occur.  Please show the CHEMOTHERAPY ALERT CARD or IMMUNOTHERAPY ALERT CARD at check-in to the Emergency Department and triage nurse.  Should you have questions after your visit or need to cancel or reschedule your appointment, please contact [facility] - A DEPT OF [facility]  Dept: [phone]  and follow the prompts.  Office hours are 8:00 a.m. to 4:30 p.m. Monday - Friday. Please note that voicemails left after 4:00 p.m. may not be returned until the following business day.  We are closed weekends and major holidays. You have access to a nurse at all times for urgent questions. Please call the main number to the clinic Dept: [phone] and follow the prompts.   For any non-urgent questions, you may also contact your provider using MyChart. We now offer e-Visits  for anyone 60 and older to request care online for non-urgent symptoms. For details visit [email or web address].   Also download the MyChart app! Go to the app store, search "MyChart", open the app, select [place], and log in with your MyChart username and password.  Pump Stop 08/31/2023 @ 11Am

## 2023-08-29 NOTE — Progress Notes (Signed)
 Patient presents today for chemotherapy infusion of irinotecan , Leucovorin , and Adrucil . Patient is in satisfactory condition with no new complaints voiced.  Vital signs are stable with exception of B/P 125/91, Dr Scherrie Curt aware. Labs reviewed by Aloha Arnold during the office visit and all labs are within treatment parameters. Patient okay to treat per MD. We will proceed with treatment per MD orders.  Patient tolerated treatment well with no complaints voiced.  Patient left ambulatory in stable condition.  Vital signs stable at discharge.  Follow up as scheduled.

## 2023-08-30 ENCOUNTER — Other Ambulatory Visit: Payer: Self-pay

## 2023-08-30 LAB — CANCER ANTIGEN 19-9: CA 19-9: 40254 U/mL — ABNORMAL HIGH (ref 0–35)

## 2023-08-31 ENCOUNTER — Inpatient Hospital Stay

## 2023-08-31 VITALS — BP 131/92 | HR 67 | Temp 98.0°F | Resp 18

## 2023-08-31 DIAGNOSIS — C259 Malignant neoplasm of pancreas, unspecified: Secondary | ICD-10-CM

## 2023-08-31 DIAGNOSIS — Z5111 Encounter for antineoplastic chemotherapy: Secondary | ICD-10-CM | POA: Diagnosis not present

## 2023-08-31 MED ORDER — HEPARIN SOD (PORK) LOCK FLUSH 100 UNIT/ML IV SOLN
500.0000 [IU] | Freq: Once | INTRAVENOUS | Status: AC | PRN
  Administered 2023-08-31: 500 [IU]

## 2023-08-31 MED ORDER — PEGFILGRASTIM-CBQV 6 MG/0.6ML ~~LOC~~ SOSY
6.0000 mg | PREFILLED_SYRINGE | Freq: Once | SUBCUTANEOUS | Status: AC
  Administered 2023-08-31: 6 mg via SUBCUTANEOUS
  Filled 2023-08-31: qty 0.6

## 2023-08-31 MED ORDER — SODIUM CHLORIDE 0.9% FLUSH
10.0000 mL | INTRAVENOUS | Status: DC | PRN
  Administered 2023-08-31: 10 mL

## 2023-08-31 NOTE — Patient Instructions (Signed)

## 2023-09-01 ENCOUNTER — Other Ambulatory Visit: Payer: Self-pay

## 2023-09-10 ENCOUNTER — Other Ambulatory Visit: Payer: Self-pay | Admitting: Oncology

## 2023-09-10 DIAGNOSIS — C259 Malignant neoplasm of pancreas, unspecified: Secondary | ICD-10-CM

## 2023-09-11 ENCOUNTER — Inpatient Hospital Stay

## 2023-09-11 ENCOUNTER — Encounter: Payer: Self-pay | Admitting: Nurse Practitioner

## 2023-09-11 ENCOUNTER — Other Ambulatory Visit: Payer: Self-pay | Admitting: *Deleted

## 2023-09-11 ENCOUNTER — Other Ambulatory Visit: Payer: Self-pay | Admitting: Oncology

## 2023-09-11 ENCOUNTER — Inpatient Hospital Stay (HOSPITAL_BASED_OUTPATIENT_CLINIC_OR_DEPARTMENT_OTHER): Admitting: Nurse Practitioner

## 2023-09-11 ENCOUNTER — Other Ambulatory Visit: Payer: Self-pay | Admitting: Nurse Practitioner

## 2023-09-11 VITALS — BP 136/87 | HR 75 | Temp 98.1°F | Resp 18 | Ht 63.0 in | Wt 225.9 lb

## 2023-09-11 DIAGNOSIS — C259 Malignant neoplasm of pancreas, unspecified: Secondary | ICD-10-CM

## 2023-09-11 DIAGNOSIS — Z5111 Encounter for antineoplastic chemotherapy: Secondary | ICD-10-CM | POA: Diagnosis not present

## 2023-09-11 LAB — CBC WITH DIFFERENTIAL (CANCER CENTER ONLY)
Abs Immature Granulocytes: 0.06 10*3/uL (ref 0.00–0.07)
Basophils Absolute: 0 10*3/uL (ref 0.0–0.1)
Basophils Relative: 1 %
Eosinophils Absolute: 0.2 10*3/uL (ref 0.0–0.5)
Eosinophils Relative: 3 %
HCT: 32.1 % — ABNORMAL LOW (ref 36.0–46.0)
Hemoglobin: 11 g/dL — ABNORMAL LOW (ref 12.0–15.0)
Immature Granulocytes: 1 %
Lymphocytes Relative: 27 %
Lymphs Abs: 1.6 10*3/uL (ref 0.7–4.0)
MCH: 34.6 pg — ABNORMAL HIGH (ref 26.0–34.0)
MCHC: 34.3 g/dL (ref 30.0–36.0)
MCV: 100.9 fL — ABNORMAL HIGH (ref 80.0–100.0)
Monocytes Absolute: 0.6 10*3/uL (ref 0.1–1.0)
Monocytes Relative: 10 %
Neutro Abs: 3.5 10*3/uL (ref 1.7–7.7)
Neutrophils Relative %: 58 %
Platelet Count: 151 10*3/uL (ref 150–400)
RBC: 3.18 MIL/uL — ABNORMAL LOW (ref 3.87–5.11)
RDW: 15.6 % — ABNORMAL HIGH (ref 11.5–15.5)
WBC Count: 6 10*3/uL (ref 4.0–10.5)
nRBC: 0 % (ref 0.0–0.2)

## 2023-09-11 LAB — CMP (CANCER CENTER ONLY)
ALT: 96 U/L — ABNORMAL HIGH (ref 0–44)
AST: 99 U/L — ABNORMAL HIGH (ref 15–41)
Albumin: 4.2 g/dL (ref 3.5–5.0)
Alkaline Phosphatase: 225 U/L — ABNORMAL HIGH (ref 38–126)
Anion gap: 14 (ref 5–15)
BUN: 12 mg/dL (ref 6–20)
CO2: 24 mmol/L (ref 22–32)
Calcium: 10 mg/dL (ref 8.9–10.3)
Chloride: 102 mmol/L (ref 98–111)
Creatinine: 0.71 mg/dL (ref 0.44–1.00)
GFR, Estimated: >60 mL/min (ref >60)
Glucose, Bld: 117 mg/dL — ABNORMAL HIGH (ref 70–99)
Potassium: 4 mmol/L (ref 3.5–5.1)
Sodium: 140 mmol/L (ref 135–145)
Total Bilirubin: 0.3 mg/dL (ref 0.0–1.2)
Total Protein: 7.3 g/dL (ref 6.5–8.1)

## 2023-09-11 LAB — HCG, SERUM, QUALITATIVE: Preg, Serum: NEGATIVE

## 2023-09-11 MED ORDER — ONDANSETRON HCL 8 MG PO TABS: 8.0000 mg | ORAL_TABLET | Freq: Three times a day (TID) | ORAL | 3 refills | Status: DC | PRN

## 2023-09-11 MED ORDER — SODIUM CHLORIDE 0.9 % IV SOLN
120.0000 mg/m2 | Freq: Once | INTRAVENOUS | Status: AC
  Administered 2023-09-11: 260 mg via INTRAVENOUS
  Filled 2023-09-11: qty 13

## 2023-09-11 MED ORDER — ATROPINE SULFATE 1 MG/ML IV SOLN
0.5000 mg | Freq: Once | INTRAVENOUS | Status: AC | PRN
  Administered 2023-09-11: 0.5 mg via INTRAVENOUS
  Filled 2023-09-11: qty 1

## 2023-09-11 MED ORDER — SODIUM CHLORIDE 0.9 % IV SOLN
2400.0000 mg/m2 | INTRAVENOUS | Status: DC
  Administered 2023-09-11: 5000 mg via INTRAVENOUS
  Filled 2023-09-11: qty 100

## 2023-09-11 MED ORDER — PALONOSETRON HCL INJECTION 0.25 MG/5ML
0.2500 mg | Freq: Once | INTRAVENOUS | Status: AC
  Administered 2023-09-11: 0.25 mg via INTRAVENOUS
  Filled 2023-09-11: qty 5

## 2023-09-11 MED ORDER — SODIUM CHLORIDE 0.9 % IV SOLN: INTRAVENOUS | Status: DC

## 2023-09-11 MED ORDER — SODIUM CHLORIDE 0.9 % IV SOLN
400.0000 mg/m2 | Freq: Once | INTRAVENOUS | Status: AC
  Administered 2023-09-11: 856 mg via INTRAVENOUS
  Filled 2023-09-11: qty 42.8

## 2023-09-11 MED ORDER — ONDANSETRON 8 MG PO TBDP: 8.0000 mg | ORAL_TABLET | Freq: Three times a day (TID) | ORAL | 2 refills | Status: DC | PRN

## 2023-09-11 MED ORDER — DEXAMETHASONE SODIUM PHOSPHATE 10 MG/ML IJ SOLN
10.0000 mg | Freq: Once | INTRAMUSCULAR | Status: AC
  Administered 2023-09-11: 10 mg via INTRAVENOUS
  Filled 2023-09-11: qty 1

## 2023-09-11 MED ORDER — ONDANSETRON 8 MG PO TBDP: 8.0000 mg | ORAL_TABLET | Freq: Three times a day (TID) | ORAL | 2 refills | Status: AC | PRN

## 2023-09-11 MED ORDER — SODIUM CHLORIDE 0.9 % IV SOLN
150.0000 mg | Freq: Once | INTRAVENOUS | Status: AC
  Administered 2023-09-11: 150 mg via INTRAVENOUS
  Filled 2023-09-11: qty 150

## 2023-09-11 NOTE — Progress Notes (Signed)
 [facility] OFFICE PROGRESS NOTE   Diagnosis: Pancreas cancer  INTERVAL HISTORY:   [name] returns as scheduled.  She completed another cycle of chemotherapy 08/29/2023.  She had mild nausea and diarrhea/constipation, less as compared to previous cycles.  She felt good last week.  Energy level was better.  No mouth sores.  She has intermittent tingling in the toes.  Persistent numbness fingertips left hand.  She has occasional pain mid abdomen.  Objective:  Vital signs in last 24 hours:  Blood pressure 136/87, pulse 75, temperature 98.1 F (36.7 C), temperature source Temporal, resp. rate 18, height 5' 3 (1.6 m), weight 225 lb 14.4 oz (102.5 kg), SpO2 98%.    HEENT: No thrush or ulcers. Resp: Lungs clear bilaterally. Cardio: Regular rate and rhythm. GI: Abdomen soft.  Mild tenderness mid abdomen superior to umbilicus.  No hepatomegaly.  No mass. Vascular: No leg edema. Skin: Palms without erythema. Port-A-Cath without erythema.  Lab Results:  Lab Results  Component Value Date   WBC 4.6 08/29/2023   HGB 11.0 (L) 08/29/2023   HCT 32.9 (L) 08/29/2023   MCV 103.1 (H) 08/29/2023   PLT 138 (L) 08/29/2023   NEUTROABS 2.7 08/29/2023    Imaging:  No results found.  Medications: I have reviewed the patient's current medications.  Assessment/Plan: Pancreas cancer CT abdomen/pelvis 03/24/2023: 3 x 4.6 cm pancreas tail mass, multiple liver lesions consistent with metastases, fat stranding surrounding the second and third ports of the duodenum and pancreas head/uncinate  CT chest 03/27/2023: New spiculated nodular density in the right lower lobe, 8 x 4 mm EUS 03/27/2023: Pancreas tail mass, liver lesions, T2 N0 M1, needle biopsy of pancreas mass, adenocarcinoma Foundation 1: K-ras G 12R, HRD signature negative, MSS, tumor mutation burden 0, PD-L1 TPS 1% Elevated CA 19-9 (121,720 on 03/26/2023) Cycle 1 FOLFIRINOX 04/10/2023 Chemotherapy held 04/24/2023 due to  neutropenia Cycle 2 FOLFIRINOX 05/04/2023, Udenyca  Cycle 3 FOLFIRINOX 05/22/2023, Udenyca  Cycle 4 FOLFIRINOX 06/05/2023, Udenyca  Cycle 5 FOLFIRINOX 06/19/2023, Udenyca  CTs 06/26/2023: Stable pancreas mass, slight increase in size and number of hepatic metastases, improved ill-defined right lower lobe nodule, new nonspecific small groundglass nodules, improved stranding at the pancreas head/neck (review of CTs with patient-pancreas mass appears slightly smaller, several liver lesions are slightly larger others measured stable or slightly smaller Cycle 6 FOLFIRINOX 07/04/2023, Udenyca  Cycle 7 FOLFIRINOX 07/17/2023, Udenyca ; oxaliplatin  held due to neuropathy and thrombocytopenia Cycle 8 FOLFIRINOX 07/31/2023, Udenyca ; oxaliplatin  held due to neuropathy, irinotecan  dose reduced due to elevated AST/ALT. CTs 08/09/2023-improvement in hepatic metastases.  Decrease in size of pancreatic tail mass.  No evidence of metastatic disease within the chest. Cycle 9 FOLFIRINOX 08/16/2023, Udenyca ; oxaliplatin  held due to neuropathy Cycle 10 FOLFIRINOX 08/29/2023, Udenyca , oxaliplatin  held due to neuropathy Cycle 11 FOLFIRINOX 09/11/2023, Udenyca , oxaliplatin  held due to neuropathy   2.  Diabetes 3.  Acute abdominal pain and nausea/vomiting 03/23/2023 secondary to pancreatitis.  Improved 03/31/2023 4.  Hypertension. 5.  Asthma 6.  Hypothyroidism 7.  Hyperlipidemia 8.  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  She has completed 10 cycles of chemotherapy.  There is no clinical evidence of disease progression.  Most recent CA 19-9 further improved.  Plan to proceed with cycle 11 today as scheduled.  Oxaliplatin  will continue to remain on hold due to neuropathy.  CBC and chemistry panel reviewed.  Labs adequate for treatment.  She will return for follow-up and chemotherapy  in 2 weeks.  We are available to see her sooner if  needed.    Olam Ned ANP/GNP-BC   09/11/2023  8:13 AM

## 2023-09-11 NOTE — Progress Notes (Signed)
 Patient seen by Olam Ned NP today  Vitals are within treatment parameters:Yes   Labs are within treatment parameters: No (Please specify and give further instructions.) AST 99 ALT 96 Alkaline Phosphatase 225 It's ok to proceed   Treatment plan has been signed: Yes   Per physician team, Patient is ready for treatment. Please note the following modifications: no oxaliplatin 

## 2023-09-11 NOTE — Patient Instructions (Signed)
 [facility] - A DEPT OF [facility]  Discharge Instructions: Thank you for choosing [facility] to provide your oncology and hematology care.   If you have a lab appointment with the Cancer Center, please go directly to the Cancer Center and check in at the registration area.   Wear comfortable clothing and clothing appropriate for easy access to any Portacath or PICC line.   We strive to give you quality time with your provider. You may need to reschedule your appointment if you arrive late (15 or more minutes).  Arriving late affects you and other patients whose appointments are after yours.  Also, if you miss three or more appointments without notifying the office, you may be dismissed from the clinic at the provider's discretion.      For prescription refill requests, have your pharmacy contact our office and allow 72 hours for refills to be completed.    Today you received the following chemotherapy and/or immunotherapy agents Irinotecan , Leucovorin , and Adrucil .    Irinotecan  Injection What is this medication? IRINOTECAN  (ir in oh TEE kan) treats some types of cancer. It works by slowing down the growth of cancer cells. This medicine may be used for other purposes; ask your health care provider or pharmacist if you have questions. COMMON BRAND NAME(S): Camptosar  What should I tell my care team before I take this medication? They need to know if you have any of these conditions: Dehydration Diarrhea Infection, especially a viral infection, such as chickenpox, cold sores, herpes Liver disease Low blood cell levels (white cells, red cells, and platelets) Low levels of electrolytes, such as calcium , magnesium , or potassium in your blood Recent or ongoing radiation An unusual or allergic reaction to irinotecan , other medications, foods, dyes, or preservatives If you or your partner are pregnant or trying to get pregnant Breast-feeding How  should I use this medication? This medication is injected into a vein. It is given by your care team in a hospital or clinic setting. Talk to your care team about the use of this medication in children. Special care may be needed. Overdosage: If you think you have taken too much of this medicine contact a poison control center or emergency room at once. NOTE: This medicine is only for you. Do not share this medicine with others. What if I miss a dose? Keep appointments for follow-up doses. It is important not to miss your dose. Call your care team if you are unable to keep an appointment. What may interact with this medication? Do not take this medication with any of the following: Cobicistat Itraconazole This medication may also interact with the following: Certain antibiotics, such as clarithromycin, rifampin, rifabutin Certain antivirals for HIV or AIDS Certain medications for fungal infections, such as ketoconazole , posaconazole, voriconazole Certain medications for seizures, such as carbamazepine, phenobarbital, phenytoin Gemfibrozil Nefazodone St. John's wort This list may not describe all possible interactions. Give your health care provider a list of all the medicines, herbs, non-prescription drugs, or dietary supplements you use. Also tell them if you smoke, drink alcohol, or use illegal drugs. Some items may interact with your medicine. What should I watch for while using this medication? Your condition will be monitored carefully while you are receiving this medication. You may need blood work while taking this medication. This medication may make you feel generally unwell. This is not uncommon as chemotherapy can affect healthy cells as well as cancer cells. Report any side effects.  Continue your course of treatment even though you feel ill unless your care team tells you to stop. This medication can cause serious side effects. To reduce the risk, your care team may give you other  medications to take before receiving this one. Be sure to follow the directions from your care team. This medication may affect your coordination, reaction time, or judgement. Do not drive or operate machinery until you know how this medication affects you. Sit up or stand slowly to reduce the risk of dizzy or fainting spells. Drinking alcohol with this medication can increase the risk of these side effects. This medication may increase your risk of getting an infection. Call your care team for advice if you get a fever, chills, sore throat, or other symptoms of a cold or flu. Do not treat yourself. Try to avoid being around people who are sick. Avoid taking medications that contain aspirin, acetaminophen , ibuprofen, naproxen, or ketoprofen unless instructed by your care team. These medications may hide a fever. This medication may increase your risk to bruise or bleed. Call your care team if you notice any unusual bleeding. Be careful brushing or flossing your teeth or using a toothpick because you may get an infection or bleed more easily. If you have any dental work done, tell your dentist you are receiving this medication. Talk to your care team if you or your partner are pregnant or think either of you might be pregnant. This medication can cause serious birth defects if taken during pregnancy and for 6 months after the last dose. You will need a negative pregnancy test before starting this medication. Contraception is recommended while taking this medication and for 6 months after the last dose. Your care team can help you find the option that works for you. Do not father a child while taking this medication and for 3 months after the last dose. Use a condom for contraception during this time period. Do not breastfeed while taking this medication and for 7 days after the last dose. This medication may cause infertility. Talk to your care team if you are concerned about your fertility. What side  effects may I notice from receiving this medication? Side effects that you should report to your care team as soon as possible: Allergic reactions--skin rash, itching, hives, swelling of the face, lips, tongue, or throat Dry cough, shortness of breath or trouble breathing Increased saliva or tears, increased sweating, stomach cramping, diarrhea, small pupils, unusual weakness or fatigue, slow heartbeat Infection--fever, chills, cough, sore throat, wounds that don't heal, pain or trouble when passing urine, general feeling of discomfort or being unwell Kidney injury--decrease in the amount of urine, swelling of the ankles, hands, or feet Low red blood cell level--unusual weakness or fatigue, dizziness, headache, trouble breathing Severe or prolonged diarrhea Unusual bruising or bleeding Side effects that usually do not require medical attention (report to your care team if they continue or are bothersome): Constipation Diarrhea Hair loss Loss of appetite Nausea Stomach pain This list may not describe all possible side effects. Call your doctor for medical advice about side effects. You may report side effects to FDA at 1-800-FDA-1088. Where should I keep my medication? This medication is given in a hospital or clinic. It will not be stored at home. NOTE: This sheet is a summary. It may not cover all possible information. If you have questions about this medicine, talk to your doctor, pharmacist, or health care provider.  2024 Elsevier/Gold Standard (2021-07-19 00:00:00)  Leucovorin   Injection What is this medication? LEUCOVORIN  (loo koe VOR in) prevents side effects from certain medications, such as methotrexate. It works by increasing folate levels. This helps protect healthy cells in your body. It may also be used to treat anemia caused by low levels of folate. It can also be used with fluorouracil , a type of chemotherapy, to treat colorectal cancer. It works by increasing the effects of  fluorouracil  in the body. This medicine may be used for other purposes; ask your health care provider or pharmacist if you have questions. What should I tell my care team before I take this medication? They need to know if you have any of these conditions: Anemia from low levels of vitamin B12 in the blood An unusual or allergic reaction to leucovorin , folic acid, other medications, foods, dyes, or preservatives Pregnant or trying to get pregnant Breastfeeding How should I use this medication? This medication is injected into a vein or a muscle. It is given by your care team in a hospital or clinic setting. Talk to your care team about the use of this medication in children. Special care may be needed. Overdosage: If you think you have taken too much of this medicine contact a poison control center or emergency room at once. NOTE: This medicine is only for you. Do not share this medicine with others. What if I miss a dose? Keep appointments for follow-up doses. It is important not to miss your dose. Call your care team if you are unable to keep an appointment. What may interact with this medication? Capecitabine Fluorouracil  Phenobarbital Phenytoin Primidone Trimethoprim;sulfamethoxazole This list may not describe all possible interactions. Give your health care provider a list of all the medicines, herbs, non-prescription drugs, or dietary supplements you use. Also tell them if you smoke, drink alcohol, or use illegal drugs. Some items may interact with your medicine. What should I watch for while using this medication? Your condition will be monitored carefully while you are receiving this medication. This medication may increase the side effects of 5-fluorouracil . Tell your care team if you have diarrhea or mouth sores that do not get better or that get worse. What side effects may I notice from receiving this medication? Side effects that you should report to your care team as soon as  possible: Allergic reactions--skin rash, itching, hives, swelling of the face, lips, tongue, or throat This list may not describe all possible side effects. Call your doctor for medical advice about side effects. You may report side effects to FDA at 1-800-FDA-1088. Where should I keep my medication? This medication is given in a hospital or clinic. It will not be stored at home. NOTE: This sheet is a summary. It may not cover all possible information. If you have questions about this medicine, talk to your doctor, pharmacist, or health care provider.  2024 Elsevier/Gold Standard (2021-08-10 00:00:00)Fluorouracil  Injection What is this medication? FLUOROURACIL  (flure oh YOOR a sil) treats some types of cancer. It works by slowing down the growth of cancer cells. This medicine may be used for other purposes; ask your health care provider or pharmacist if you have questions. COMMON BRAND NAME(S): Adrucil  What should I tell my care team before I take this medication? They need to know if you have any of these conditions: Blood disorders Dihydropyrimidine dehydrogenase (DPD) deficiency Infection, such as chickenpox, cold sores, herpes Kidney disease Liver disease Poor nutrition Recent or ongoing radiation therapy An unusual or allergic reaction to fluorouracil , other medications, foods, dyes, or  preservatives If you or your partner are pregnant or trying to get pregnant Breast-feeding How should I use this medication? This medication is injected into a vein. It is administered by your care team in a hospital or clinic setting. Talk to your care team about the use of this medication in children. Special care may be needed. Overdosage: If you think you have taken too much of this medicine contact a poison control center or emergency room at once. NOTE: This medicine is only for you. Do not share this medicine with others. What if I miss a dose? Keep appointments for follow-up doses. It is  important not to miss your dose. Call your care team if you are unable to keep an appointment. What may interact with this medication? Do not take this medication with any of the following: Live virus vaccines This medication may also interact with the following: Medications that treat or prevent blood clots, such as warfarin, enoxaparin , dalteparin This list may not describe all possible interactions. Give your health care provider a list of all the medicines, herbs, non-prescription drugs, or dietary supplements you use. Also tell them if you smoke, drink alcohol, or use illegal drugs. Some items may interact with your medicine. What should I watch for while using this medication? Your condition will be monitored carefully while you are receiving this medication. This medication may make you feel generally unwell. This is not uncommon as chemotherapy can affect healthy cells as well as cancer cells. Report any side effects. Continue your course of treatment even though you feel ill unless your care team tells you to stop. In some cases, you may be given additional medications to help with side effects. Follow all directions for their use. This medication may increase your risk of getting an infection. Call your care team for advice if you get a fever, chills, sore throat, or other symptoms of a cold or flu. Do not treat yourself. Try to avoid being around people who are sick. This medication may increase your risk to bruise or bleed. Call your care team if you notice any unusual bleeding. Be careful brushing or flossing your teeth or using a toothpick because you may get an infection or bleed more easily. If you have any dental work done, tell your dentist you are receiving this medication. Avoid taking medications that contain aspirin, acetaminophen , ibuprofen, naproxen, or ketoprofen unless instructed by your care team. These medications may hide a fever. Do not treat diarrhea with over the  counter products. Contact your care team if you have diarrhea that lasts more than 2 days or if it is severe and watery. This medication can make you more sensitive to the sun. Keep out of the sun. If you cannot avoid being in the sun, wear protective clothing and sunscreen. Do not use sun lamps, tanning beds, or tanning booths. Talk to your care team if you or your partner wish to become pregnant or think you might be pregnant. This medication can cause serious birth defects if taken during pregnancy and for 3 months after the last dose. A reliable form of contraception is recommended while taking this medication and for 3 months after the last dose. Talk to your care team about effective forms of contraception. Do not father a child while taking this medication and for 3 months after the last dose. Use a condom while having sex during this time period. Do not breastfeed while taking this medication. This medication may cause infertility. Talk to your  care team if you are concerned about your fertility. What side effects may I notice from receiving this medication? Side effects that you should report to your care team as soon as possible: Allergic reactions--skin rash, itching, hives, swelling of the face, lips, tongue, or throat Heart attack--pain or tightness in the chest, shoulders, arms, or jaw, nausea, shortness of breath, cold or clammy skin, feeling faint or lightheaded Heart failure--shortness of breath, swelling of the ankles, feet, or hands, sudden weight gain, unusual weakness or fatigue Heart rhythm changes--fast or irregular heartbeat, dizziness, feeling faint or lightheaded, chest pain, trouble breathing High ammonia level--unusual weakness or fatigue, confusion, loss of appetite, nausea, vomiting, seizures Infection--fever, chills, cough, sore throat, wounds that don't heal, pain or trouble when passing urine, general feeling of discomfort or being unwell Low red blood cell  level--unusual weakness or fatigue, dizziness, headache, trouble breathing Pain, tingling, or numbness in the hands or feet, muscle weakness, change in vision, confusion or trouble speaking, loss of balance or coordination, trouble walking, seizures Redness, swelling, and blistering of the skin over hands and feet Severe or prolonged diarrhea Unusual bruising or bleeding Side effects that usually do not require medical attention (report to your care team if they continue or are bothersome): Dry skin Headache Increased tears Nausea Pain, redness, or swelling with sores inside the mouth or throat Sensitivity to light Vomiting This list may not describe all possible side effects. Call your doctor for medical advice about side effects. You may report side effects to FDA at 1-800-FDA-1088. Where should I keep my medication? This medication is given in a hospital or clinic. It will not be stored at home. NOTE: This sheet is a summary. It may not cover all possible information. If you have questions about this medicine, talk to your doctor, pharmacist, or health care provider.  2024 Elsevier/Gold Standard (2021-07-13 00:00:00)   To help prevent nausea and vomiting after your treatment, we encourage you to take your nausea medication as directed.  BELOW ARE SYMPTOMS THAT SHOULD BE REPORTED IMMEDIATELY: *FEVER GREATER THAN 100.4 F (38 C) OR HIGHER *CHILLS OR SWEATING *NAUSEA AND VOMITING THAT IS NOT CONTROLLED WITH YOUR NAUSEA MEDICATION *UNUSUAL SHORTNESS OF BREATH *UNUSUAL BRUISING OR BLEEDING *URINARY PROBLEMS (pain or burning when urinating, or frequent urination) *BOWEL PROBLEMS (unusual diarrhea, constipation, pain near the anus) TENDERNESS IN MOUTH AND THROAT WITH OR WITHOUT PRESENCE OF ULCERS (sore throat, sores in mouth, or a toothache) UNUSUAL RASH, SWELLING OR PAIN  UNUSUAL VAGINAL DISCHARGE OR ITCHING   Items with * indicate a potential emergency and should be followed up as  soon as possible or go to the Emergency Department if any problems should occur.  Please show the CHEMOTHERAPY ALERT CARD or IMMUNOTHERAPY ALERT CARD at check-in to the Emergency Department and triage nurse.  Should you have questions after your visit or need to cancel or reschedule your appointment, please contact [facility] - A DEPT OF [facility]  Dept: [phone]  and follow the prompts.  Office hours are 8:00 a.m. to 4:30 p.m. Monday - Friday. Please note that voicemails left after 4:00 p.m. may not be returned until the following business day.  We are closed weekends and major holidays. You have access to a nurse at all times for urgent questions. Please call the main number to the clinic Dept: [phone] and follow the prompts.   For any non-urgent questions, you may also contact your provider using MyChart. We now offer e-Visits  for anyone 60 and older to request care online for non-urgent symptoms. For details visit [email or web address].   Also download the MyChart app! Go to the app store, search "MyChart", open the app, select [place], and log in with your MyChart username and password.  Pump Stop 08/31/2023 @ 11Am

## 2023-09-12 ENCOUNTER — Telehealth: Payer: Self-pay

## 2023-09-12 LAB — CANCER ANTIGEN 19-9: CA 19-9: 34655 U/mL — ABNORMAL HIGH (ref 0–35)

## 2023-09-12 NOTE — Telephone Encounter (Signed)
 Patient gave verbal understanding and had no further questions or concerns

## 2023-09-12 NOTE — Telephone Encounter (Signed)
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

## 2023-09-13 ENCOUNTER — Inpatient Hospital Stay

## 2023-09-13 ENCOUNTER — Other Ambulatory Visit: Payer: Self-pay | Admitting: Internal Medicine

## 2023-09-13 VITALS — BP 135/89 | HR 67 | Temp 98.6°F | Resp 18

## 2023-09-13 DIAGNOSIS — C259 Malignant neoplasm of pancreas, unspecified: Secondary | ICD-10-CM

## 2023-09-13 DIAGNOSIS — Z5111 Encounter for antineoplastic chemotherapy: Secondary | ICD-10-CM | POA: Diagnosis not present

## 2023-09-13 MED ORDER — PEGFILGRASTIM-CBQV 6 MG/0.6ML ~~LOC~~ SOSY
6.0000 mg | PREFILLED_SYRINGE | Freq: Once | SUBCUTANEOUS | Status: AC
  Administered 2023-09-13: 6 mg via SUBCUTANEOUS
  Filled 2023-09-13: qty 0.6

## 2023-09-13 MED ORDER — SODIUM CHLORIDE 0.9% FLUSH
10.0000 mL | INTRAVENOUS | Status: DC | PRN
Start: 2023-09-13 — End: 2023-09-13
  Administered 2023-09-13: 10 mL

## 2023-09-13 MED ORDER — HEPARIN SOD (PORK) LOCK FLUSH 100 UNIT/ML IV SOLN
500.0000 [IU] | Freq: Once | INTRAVENOUS | Status: AC | PRN
  Administered 2023-09-13: 500 [IU]

## 2023-09-13 NOTE — Patient Instructions (Signed)

## 2023-09-13 NOTE — Progress Notes (Signed)
 After de accessing, patient stated that the Tegaderm dressing had been itching a lot more than usual. No redness around PAC noted. Made patient aware a sensitive dressing can be placed instead. Patient stated would continue with the original dressing. Let known if itching continues, when dressing is applied again let one of the Collab/infusion nurses aware. Understood, no further questions.

## 2023-09-14 ENCOUNTER — Other Ambulatory Visit: Payer: Self-pay

## 2023-09-18 ENCOUNTER — Ambulatory Visit (INDEPENDENT_AMBULATORY_CARE_PROVIDER_SITE_OTHER): Admitting: Family

## 2023-09-18 VITALS — BP 110/78 | HR 84 | Temp 97.3°F | Ht 63.0 in | Wt 221.8 lb

## 2023-09-18 DIAGNOSIS — J069 Acute upper respiratory infection, unspecified: Secondary | ICD-10-CM

## 2023-09-18 MED ORDER — DOXYCYCLINE HYCLATE 100 MG PO TABS
100.0000 mg | ORAL_TABLET | Freq: Two times a day (BID) | ORAL | 0 refills | Status: DC
Start: 2023-09-18 — End: 2023-09-29

## 2023-09-18 MED ORDER — BENZONATATE 100 MG PO CAPS: 100.0000 mg | ORAL_CAPSULE | Freq: Three times a day (TID) | ORAL | 0 refills | Status: AC | PRN

## 2023-09-18 NOTE — Progress Notes (Signed)
 Patient ID: Debbie [name], female    DOB: 1971/03/16, 53 y.o.   MRN: [id number]  Chief Complaint  Patient presents with   Cough    Pt started  having coughing, sore throat  , no fever, having pressure in head, using inhaler a lot sob, wheezing,she is has been using nasal spray    Discussed the use of AI scribe software for clinical note transcription with the patient, who gave verbal consent to proceed.  History of Present Illness Debbie [name] is a 53 year old female who presents with cough, sore throat, and sinus pressure.  Symptoms began on Thursday with cough, sore throat, and sinus pressure. She experiences head pressure and right ear discomfort, with a history of frequent ear infections. Yellow mucus production from sinuses and chest started this morning. She uses Airsupra  inhaler twice daily for chest tightness and wheezing, and nasal sprays daily for sinus symptoms. She is immunocompromised and allergic to atorvastatin. She tolerates doxycycline  well. She receives weekly treatments including a steroid but is off treatment this week.  Assessment & Plan Upper Respiratory Infection Lungs clear, Likely viral but risk of quickly turning bacterial due to immunocompromised status. Yellow mucus nasal and chest mucus. Doxycycline  chosen for better tolerance and previous ineffectiveness of Augmentin . - Prescribe doxycycline  100 mg twice daily, advised on use & possible SE. - Encourage increased fluid intake. - Recommend saline nasal spray, use tid to help disinfect sinuses. - Continue Airsupra  inhaler twice daily, with additional dose midday if needed. - Prescribe Tessalon  Perles for cough. - Hydrate with 2L non-caffeinated beverages daily. - Call office if sx have not improved after finishing DOXY.  Oral Thrush Yellowish tongue believed to be due to chemotherapy. Uses nystatin  mouthwash daily.   Subjective:    Outpatient Medications Prior to Visit  Medication Sig Dispense Refill    ALPRAZolam  (XANAX ) 0.25 MG tablet Take 1 tablet (0.25 mg total) by mouth 2 (two) times daily as needed for anxiety. 60 tablet 0   Azelastine -Fluticasone  137-50 MCG/ACT SUSP PLACE 1 SPRAY INTO THE NOSE EVERY 12 (TWELVE) HOURS. 23 g 1   BD PEN NEEDLE NANO 2ND GEN 32G X 4 MM MISC USE AS DIRECTED 4 TIMES A DAY 300 each 0   Blood Glucose Monitoring Suppl DEVI 1 each by Does not apply route in the morning, at noon, and at bedtime. May substitute to any manufacturer covered by patient's insurance. 1 each 0   Continuous Glucose Sensor (DEXCOM G7 SENSOR) MISC 1 Act by Does not apply route daily. 1 each 11   escitalopram  (LEXAPRO ) 20 MG tablet TAKE 1 TABLET BY MOUTH EVERY DAY 90 tablet 3   Glucagon  (GVOKE HYPOPEN  2-PACK) 0.5 MG/0.1ML SOAJ Inject 0.5 mg into the skin daily as needed (for low blood sugar). 0.2 mL 1   insulin  glargine (LANTUS ) 100 UNIT/ML Solostar Pen Inject 20 Units into the skin daily. 30 mL 0   levocetirizine (XYZAL ) 5 MG tablet TAKE 1 TABLET BY MOUTH EVERY DAY IN THE EVENING 90 tablet 3   levothyroxine  (SYNTHROID ) 75 MCG tablet Take 1 tablet (75 mcg total) by mouth daily. 90 tablet 3   lidocaine -prilocaine  (EMLA ) cream Apply 1 Application topically as needed (Apply to port 1-2 hours prior to use). 30 g 0   loperamide (IMODIUM) 2 MG capsule Take 2-4 mg by mouth as needed for diarrhea or loose stools.     losartan -hydrochlorothiazide  (HYZAAR) 50-12.5 MG tablet Take 0.5 tablets by mouth daily. Ok to start with just  half tablet daily for week 1 and leave it there if goal of 140/90 reached. 90 tablet 4   magnesium  oxide (MAG-OX) 400 MG tablet Take 1 tablet by mouth 2 (two) times daily.     montelukast  (SINGULAIR ) 10 MG tablet Take 1 tablet by mouth every evening.  5   ondansetron  (ZOFRAN -ODT) 8 MG disintegrating tablet Take 1 tablet (8 mg total) by mouth every 8 (eight) hours as needed for nausea or vomiting. 30 tablet 2   pantoprazole  (PROTONIX ) 40 MG tablet Take 1 tablet (40 mg total) by mouth 2  (two) times daily. 180 tablet 1   potassium chloride  SA (KLOR-CON  M) 20 MEQ tablet TAKE 1 TABLET BY MOUTH EVERY DAY 90 tablet 0   prochlorperazine  (COMPAZINE ) 10 MG tablet Take 1 tablet (10 mg total) by mouth every 6 (six) hours as needed for nausea or vomiting. 30 tablet 3   scopolamine  (TRANSDERM-SCOP) 1 MG/3DAYS Place 1 patch (1.5 mg total) onto the skin every 3 (three) days. 10 patch 12   Glucose Blood (BLOOD GLUCOSE TEST STRIPS) STRP 1 each by In Vitro route in the morning, at noon, and at bedtime. May substitute to any manufacturer covered by patient's insurance. (Patient not taking: Reported on 09/18/2023) 100 strip 0   Magnesium  Glycinate 100 MG CAPS Take 1 tablet by mouth daily at 6 (six) AM. (Patient not taking: Reported on 09/18/2023) 90 capsule 3   No facility-administered medications prior to visit.   Past Medical History:  Diagnosis Date   Acute renal failure (ARF) (HCC) 10/01/2015   Allergy    Depression    DKA, type 2 (HCC) 03/01/2023   Elevated cholesterol    History of endometrial ablation 05/20/2021   2021   Hyperlipidemia    Hypertension    Hypertriglyceridemia 02/08/2022   No components found for: TG Lab Results Component Value Date/Time  TRIG 125.0 08/18/2021 08:53 AM  TRIG 163.0 (H) 12/29/2020 09:20 AM  TRIG 148.0 08/04/2020 08:24 AM  TRIG 177.0 (H) 04/03/2019 07:30 AM     Hypothyroidism    Insomnia 11/08/2021   Morbid obesity (HCC) 02/08/2019   New onset type 2 diabetes mellitus (HCC) 02/23/2023   Type 2 Diabetes Mellitus (E11.9) Onset: 02/23/2023 Key Events:  Initial A1c: 11.1% Presenting with polydipsia Current Treatment: Initiating Metformin  and Mounjaro  Monitoring: Blood glucose monitoring TID Follow-up in 2 weeks Quality Metrics: Eye exam needed Diabetes education pending Tags: #NewProblem #RequiresFollowUp #PatientEducation     Seasonal allergies    Sleep apnea    wears cpap    Thyroid disease    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 Fleeta Smock    Past Surgical History:  Procedure Laterality Date   ABLATION  10/2019   ADENOIDECTOMY     BIOPSY  03/27/2023   Procedure: BIOPSY;  Surgeon: Wilhelmenia Aloha Raddle., MD;  Location: [facility] ENDOSCOPY;  Service: Gastroenterology;;   COLONOSCOPY     DENTAL SURGERY     graft of mouth    ESOPHAGOGASTRODUODENOSCOPY (EGD) WITH PROPOFOL  N/A 03/27/2023   Procedure: ESOPHAGOGASTRODUODENOSCOPY (EGD) WITH PROPOFOL ;  Surgeon: Wilhelmenia Aloha Raddle., MD;  Location: [facility] ENDOSCOPY;  Service: Gastroenterology;  Laterality: N/A;   EUS  03/27/2023   Procedure: UPPER ENDOSCOPIC ULTRASOUND (EUS) LINEAR;  Surgeon: Wilhelmenia Aloha Raddle., MD;  Location: [facility] ENDOSCOPY;  Service: Gastroenterology;;   FINE NEEDLE ASPIRATION  03/27/2023   Procedure: FINE NEEDLE ASPIRATION (FNA) LINEAR;  Surgeon: Wilhelmenia Aloha Raddle., MD;  Location: [facility] ENDOSCOPY;  Service:  Gastroenterology;;   IR IMAGING GUIDED PORT INSERTION  04/03/2023   Allergies  Allergen Reactions   Atorvastatin Hives      Objective:    Physical Exam Vitals and nursing note reviewed.  Constitutional:      Appearance: Normal appearance. She is ill-appearing.     Interventions: Face mask in place.  HENT:     Right Ear: Ear canal normal. Tympanic membrane is scarred.     Left Ear: Tympanic membrane and ear canal normal.     Nose:     Right Sinus: Frontal sinus tenderness present.     Left Sinus: Frontal sinus tenderness present.     Mouth/Throat:     Mouth: Mucous membranes are moist.     Pharynx: Posterior oropharyngeal erythema present. No pharyngeal swelling, oropharyngeal exudate or uvula swelling.     Tonsils: No tonsillar exudate or tonsillar abscesses.   Cardiovascular:     Rate and Rhythm: Normal rate and regular rhythm.  Pulmonary:     Effort: Pulmonary effort is normal.     Breath sounds: Normal breath sounds.   Musculoskeletal:        General: Normal range of motion.   Lymphadenopathy:     Head:     Right side of head: No preauricular or posterior auricular adenopathy.     Left side of head: No preauricular or posterior auricular adenopathy.     Cervical: No cervical adenopathy.   Skin:    General: Skin is warm and dry.   Neurological:     Mental Status: She is alert.   Psychiatric:        Mood and Affect: Mood normal.        Behavior: Behavior normal.    BP 110/78   Pulse 84   Temp (!) 97.3 F (36.3 C)   Ht 5' 3 (1.6 m)   Wt 221 lb 12.8 oz (100.6 kg)   SpO2 94%   BMI 39.29 kg/m  Wt Readings from Last 3 Encounters:  09/18/23 221 lb 12.8 oz (100.6 kg)  09/11/23 225 lb 14.4 oz (102.5 kg)  08/29/23 223 lb 6.4 oz (101.3 kg)       Lucius Krabbe, NP

## 2023-09-22 ENCOUNTER — Other Ambulatory Visit: Payer: Self-pay

## 2023-09-24 ENCOUNTER — Other Ambulatory Visit: Payer: Self-pay | Admitting: Oncology

## 2023-09-25 ENCOUNTER — Other Ambulatory Visit: Payer: Self-pay | Admitting: *Deleted

## 2023-09-25 ENCOUNTER — Encounter: Payer: Self-pay | Admitting: Nurse Practitioner

## 2023-09-25 ENCOUNTER — Inpatient Hospital Stay

## 2023-09-25 ENCOUNTER — Inpatient Hospital Stay (HOSPITAL_BASED_OUTPATIENT_CLINIC_OR_DEPARTMENT_OTHER): Admitting: Nurse Practitioner

## 2023-09-25 ENCOUNTER — Inpatient Hospital Stay: Attending: Nurse Practitioner

## 2023-09-25 VITALS — BP 140/82 | HR 80 | Temp 98.2°F | Resp 18 | Ht 63.0 in | Wt 224.3 lb

## 2023-09-25 VITALS — BP 139/91 | HR 67 | Temp 98.1°F | Resp 18

## 2023-09-25 DIAGNOSIS — C787 Secondary malignant neoplasm of liver and intrahepatic bile duct: Secondary | ICD-10-CM | POA: Diagnosis present

## 2023-09-25 DIAGNOSIS — C259 Malignant neoplasm of pancreas, unspecified: Secondary | ICD-10-CM

## 2023-09-25 DIAGNOSIS — Z79631 Long term (current) use of antimetabolite agent: Secondary | ICD-10-CM | POA: Diagnosis not present

## 2023-09-25 DIAGNOSIS — Z79634 Long term (current) use of topoisomerase inhibitor: Secondary | ICD-10-CM | POA: Diagnosis not present

## 2023-09-25 DIAGNOSIS — Z5189 Encounter for other specified aftercare: Secondary | ICD-10-CM | POA: Insufficient documentation

## 2023-09-25 DIAGNOSIS — Z5111 Encounter for antineoplastic chemotherapy: Secondary | ICD-10-CM | POA: Diagnosis present

## 2023-09-25 DIAGNOSIS — C252 Malignant neoplasm of tail of pancreas: Secondary | ICD-10-CM | POA: Diagnosis present

## 2023-09-25 LAB — CBC WITH DIFFERENTIAL (CANCER CENTER ONLY)
Abs Immature Granulocytes: 0.06 K/uL (ref 0.00–0.07)
Basophils Absolute: 0 K/uL (ref 0.0–0.1)
Basophils Relative: 1 %
Eosinophils Absolute: 0.2 K/uL (ref 0.0–0.5)
Eosinophils Relative: 3 %
HCT: 33.8 % — ABNORMAL LOW (ref 36.0–46.0)
Hemoglobin: 11.5 g/dL — ABNORMAL LOW (ref 12.0–15.0)
Immature Granulocytes: 1 %
Lymphocytes Relative: 31 %
Lymphs Abs: 1.7 K/uL (ref 0.7–4.0)
MCH: 34.3 pg — ABNORMAL HIGH (ref 26.0–34.0)
MCHC: 34 g/dL (ref 30.0–36.0)
MCV: 100.9 fL — ABNORMAL HIGH (ref 80.0–100.0)
Monocytes Absolute: 0.4 K/uL (ref 0.1–1.0)
Monocytes Relative: 7 %
Neutro Abs: 3.1 K/uL (ref 1.7–7.7)
Neutrophils Relative %: 57 %
Platelet Count: 174 K/uL (ref 150–400)
RBC: 3.35 MIL/uL — ABNORMAL LOW (ref 3.87–5.11)
RDW: 15.5 % (ref 11.5–15.5)
WBC Count: 5.4 K/uL (ref 4.0–10.5)
nRBC: 0 % (ref 0.0–0.2)

## 2023-09-25 LAB — CMP (CANCER CENTER ONLY)
ALT: 109 U/L — ABNORMAL HIGH (ref 0–44)
AST: 94 U/L — ABNORMAL HIGH (ref 15–41)
Albumin: 4.4 g/dL (ref 3.5–5.0)
Alkaline Phosphatase: 228 U/L — ABNORMAL HIGH (ref 38–126)
Anion gap: 13 (ref 5–15)
BUN: 10 mg/dL (ref 6–20)
CO2: 25 mmol/L (ref 22–32)
Calcium: 10.6 mg/dL — ABNORMAL HIGH (ref 8.9–10.3)
Chloride: 104 mmol/L (ref 98–111)
Creatinine: 0.71 mg/dL (ref 0.44–1.00)
GFR, Estimated: >60 mL/min (ref >60)
Glucose, Bld: 113 mg/dL — ABNORMAL HIGH (ref 70–99)
Potassium: 4 mmol/L (ref 3.5–5.1)
Sodium: 141 mmol/L (ref 135–145)
Total Bilirubin: 0.3 mg/dL (ref 0.0–1.2)
Total Protein: 7.7 g/dL (ref 6.5–8.1)

## 2023-09-25 LAB — HCG, SERUM, QUALITATIVE: Preg, Serum: NEGATIVE

## 2023-09-25 MED ORDER — SODIUM CHLORIDE 0.9 % IV SOLN
150.0000 mg | Freq: Once | INTRAVENOUS | Status: AC
  Administered 2023-09-25: 150 mg via INTRAVENOUS
  Filled 2023-09-25: qty 150

## 2023-09-25 MED ORDER — ATROPINE SULFATE 1 MG/ML IV SOLN
0.5000 mg | Freq: Once | INTRAVENOUS | Status: AC | PRN
  Administered 2023-09-25: 0.5 mg via INTRAVENOUS
  Filled 2023-09-25: qty 1

## 2023-09-25 MED ORDER — DEXAMETHASONE SODIUM PHOSPHATE 10 MG/ML IJ SOLN
10.0000 mg | Freq: Once | INTRAMUSCULAR | Status: AC
  Administered 2023-09-25: 10 mg via INTRAVENOUS
  Filled 2023-09-25: qty 1

## 2023-09-25 MED ORDER — SODIUM CHLORIDE 0.9 % IV SOLN
400.0000 mg/m2 | Freq: Once | INTRAVENOUS | Status: AC
  Administered 2023-09-25: 856 mg via INTRAVENOUS
  Filled 2023-09-25: qty 42.8

## 2023-09-25 MED ORDER — SODIUM CHLORIDE 0.9 % IV SOLN: INTRAVENOUS | Status: DC

## 2023-09-25 MED ORDER — SODIUM CHLORIDE 0.9 % IV SOLN
120.0000 mg/m2 | Freq: Once | INTRAVENOUS | Status: AC
  Administered 2023-09-25: 260 mg via INTRAVENOUS
  Filled 2023-09-25: qty 13

## 2023-09-25 MED ORDER — SODIUM CHLORIDE 0.9 % IV SOLN
2400.0000 mg/m2 | INTRAVENOUS | Status: DC
  Administered 2023-09-25: 5000 mg via INTRAVENOUS
  Filled 2023-09-25: qty 100

## 2023-09-25 MED ORDER — PALONOSETRON HCL INJECTION 0.25 MG/5ML
0.2500 mg | Freq: Once | INTRAVENOUS | Status: AC
  Administered 2023-09-25: 0.25 mg via INTRAVENOUS
  Filled 2023-09-25: qty 5

## 2023-09-25 NOTE — Addendum Note (Signed)
 Addended by: CLORETTA KUBA B on: 09/25/2023 10:50 AM   Modules accepted: Orders

## 2023-09-25 NOTE — Progress Notes (Signed)
 [facility] OFFICE PROGRESS NOTE   Diagnosis: Pancreas cancer  INTERVAL HISTORY:   Debbie [name] returns as scheduled.  She completed cycle 11 FOLFIRINOX 09/11/2023.  Oxaliplatin  has been on hold due to neuropathy.  She had mild nausea.  No mouth sores.  Periodic loose stools and constipation.  Persistent numbness in the fingertips on the left hand.  Continued cold sensitivity.  Developed cough/congestion last week.  Completed course of doxycycline .  Symptoms are better.  No fever.  Occasional pain mid upper abdomen.  Objective:  Vital signs in last 24 hours:  Blood pressure (!) 140/82, pulse 80, temperature 98.2 F (36.8 C), temperature source Temporal, resp. rate 18, height 5' 3 (1.6 m), weight 224 lb 4.8 oz (101.7 kg), SpO2 98%.    HEENT: No thrush or ulcers. Resp: Lungs clear bilaterally. Cardio: Regular rate and rhythm. GI: No hepatosplenomegaly.  No mass.  Mild tenderness mid upper abdomen. Vascular: No leg edema. Skin: No rash. Port-A-Cath without erythema.  Lab Results:  Lab Results  Component Value Date   WBC 5.4 09/25/2023   HGB 11.5 (L) 09/25/2023   HCT 33.8 (L) 09/25/2023   MCV 100.9 (H) 09/25/2023   PLT 174 09/25/2023   NEUTROABS 3.1 09/25/2023    Imaging:  No results found.  Medications: I have reviewed the patient's current medications.  Assessment/Plan: Pancreas cancer CT abdomen/pelvis 03/24/2023: 3 x 4.6 cm pancreas tail mass, multiple liver lesions consistent with metastases, fat stranding surrounding the second and third ports of the duodenum and pancreas head/uncinate  CT chest 03/27/2023: New spiculated nodular density in the right lower lobe, 8 x 4 mm EUS 03/27/2023: Pancreas tail mass, liver lesions, T2 N0 M1, needle biopsy of pancreas mass, adenocarcinoma Foundation 1: K-ras G 12R, HRD signature negative, MSS, tumor mutation burden 0, PD-L1 TPS 1% Elevated CA 19-9 (121,720 on 03/26/2023) Cycle 1 FOLFIRINOX 04/10/2023 Chemotherapy held  04/24/2023 due to neutropenia Cycle 2 FOLFIRINOX 05/04/2023, Udenyca  Cycle 3 FOLFIRINOX 05/22/2023, Udenyca  Cycle 4 FOLFIRINOX 06/05/2023, Udenyca  Cycle 5 FOLFIRINOX 06/19/2023, Udenyca  CTs 06/26/2023: Stable pancreas mass, slight increase in size and number of hepatic metastases, improved ill-defined right lower lobe nodule, new nonspecific small groundglass nodules, improved stranding at the pancreas head/neck (review of CTs with patient-pancreas mass appears slightly smaller, several liver lesions are slightly larger others measured stable or slightly smaller Cycle 6 FOLFIRINOX 07/04/2023, Udenyca  Cycle 7 FOLFIRINOX 07/17/2023, Udenyca ; oxaliplatin  held due to neuropathy and thrombocytopenia Cycle 8 FOLFIRINOX 07/31/2023, Udenyca ; oxaliplatin  held due to neuropathy, irinotecan  dose reduced due to elevated AST/ALT. CTs 08/09/2023-improvement in hepatic metastases.  Decrease in size of pancreatic tail mass.  No evidence of metastatic disease within the chest. Cycle 9 FOLFIRINOX 08/16/2023, Udenyca ; oxaliplatin  held due to neuropathy Cycle 10 FOLFIRINOX 08/29/2023, Udenyca , oxaliplatin  held due to neuropathy Cycle 11 FOLFIRINOX 09/11/2023, Udenyca , oxaliplatin  held due to neuropathy Cycle 12 FOLFIRINOX 09/25/2023, Udenyca , oxaliplatin  held due to neuropathy   2.  Diabetes 3.  Acute abdominal pain and nausea/vomiting 03/23/2023 secondary to pancreatitis.  Improved 03/31/2023 4.  Hypertension. 5.  Asthma 6.  Hypothyroidism 7.  Hyperlipidemia 8.  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  She has completed 11 cycles of FOLFIRINOX.  Overall tolerating treatment well.  No clinical evidence of disease progression.  Most recent CA 19-9 further improved.  Oxaliplatin  has been on hold due to neuropathy.  Plan to proceed with cycle 12 today as scheduled, continue to hold  oxaliplatin .  CBC and  chemistry panel reviewed.  Labs adequate for treatment.  Stable mild elevation AST/ALT.  She will return for follow-up and treatment in 2 weeks.  We are available to see her sooner if needed.    Olam Ned ANP/GNP-BC   09/25/2023  10:04 AM

## 2023-09-25 NOTE — Patient Instructions (Signed)
 [facility] - A DEPT OF [facility]  Discharge Instructions: Thank you for choosing [facility] to provide your oncology and hematology care.   If you have a lab appointment with the Cancer Center, please go directly to the Cancer Center and check in at the registration area.   Wear comfortable clothing and clothing appropriate for easy access to any Portacath or PICC line.   We strive to give you quality time with your provider. You may need to reschedule your appointment if you arrive late (15 or more minutes).  Arriving late affects you and other patients whose appointments are after yours.  Also, if you miss three or more appointments without notifying the office, you may be dismissed from the clinic at the provider's discretion.      For prescription refill requests, have your pharmacy contact our office and allow 72 hours for refills to be completed.    Today you received the following chemotherapy and/or immunotherapy agents Irinotecan , Leucovorin  and Adrucil       To help prevent nausea and vomiting after your treatment, we encourage you to take your nausea medication as directed.  BELOW ARE SYMPTOMS THAT SHOULD BE REPORTED IMMEDIATELY: *FEVER GREATER THAN 100.4 F (38 C) OR HIGHER *CHILLS OR SWEATING *NAUSEA AND VOMITING THAT IS NOT CONTROLLED WITH YOUR NAUSEA MEDICATION *UNUSUAL SHORTNESS OF BREATH *UNUSUAL BRUISING OR BLEEDING *URINARY PROBLEMS (pain or burning when urinating, or frequent urination) *BOWEL PROBLEMS (unusual diarrhea, constipation, pain near the anus) TENDERNESS IN MOUTH AND THROAT WITH OR WITHOUT PRESENCE OF ULCERS (sore throat, sores in mouth, or a toothache) UNUSUAL RASH, SWELLING OR PAIN  UNUSUAL VAGINAL DISCHARGE OR ITCHING   Items with * indicate a potential emergency and should be followed up as soon as possible or go to the Emergency Department if any problems should occur.  Please show the CHEMOTHERAPY  ALERT CARD or IMMUNOTHERAPY ALERT CARD at check-in to the Emergency Department and triage nurse.  Should you have questions after your visit or need to cancel or reschedule your appointment, please contact [facility] - A DEPT OF [facility]  Dept: [phone]  and follow the prompts.  Office hours are 8:00 a.m. to 4:30 p.m. Monday - Friday. Please note that voicemails left after 4:00 p.m. may not be returned until the following business day.  We are closed weekends and major holidays. You have access to a nurse at all times for urgent questions. Please call the main number to the clinic Dept: [phone] and follow the prompts.   For any non-urgent questions, you may also contact your provider using MyChart. We now offer e-Visits for anyone 46 and older to request care online for non-urgent symptoms. For details visit [email or web address].   Also download the MyChart app! Go to the app store, search MyChart, open the app, select [place], and log in with your MyChart username and password.

## 2023-09-25 NOTE — Progress Notes (Signed)
 Patient seen by Olam Ned NP today  Vitals are within treatment parameters:Yes   Labs are within treatment parameters: Yes AST 94 and ALT 109  Treatment plan has been signed: Yes   Per physician team, Patient is ready for treatment. Please note the following modifications: no oxaliplatin 

## 2023-09-26 ENCOUNTER — Telehealth: Payer: Self-pay | Admitting: Oncology

## 2023-09-26 ENCOUNTER — Other Ambulatory Visit: Payer: Self-pay | Admitting: Oncology

## 2023-09-26 DIAGNOSIS — C259 Malignant neoplasm of pancreas, unspecified: Secondary | ICD-10-CM

## 2023-09-26 NOTE — Telephone Encounter (Signed)
 Patient has been scheduled for follow-up visit per 09/25/23 LOS.  Pt given an appt calendar with date and time.

## 2023-09-27 ENCOUNTER — Inpatient Hospital Stay

## 2023-09-27 ENCOUNTER — Other Ambulatory Visit: Payer: Self-pay | Admitting: Oncology

## 2023-09-27 ENCOUNTER — Other Ambulatory Visit: Payer: Self-pay

## 2023-09-27 VITALS — BP 132/84 | HR 65 | Temp 97.9°F | Resp 18

## 2023-09-27 DIAGNOSIS — Z5111 Encounter for antineoplastic chemotherapy: Secondary | ICD-10-CM | POA: Diagnosis not present

## 2023-09-27 DIAGNOSIS — C259 Malignant neoplasm of pancreas, unspecified: Secondary | ICD-10-CM

## 2023-09-27 LAB — CANCER ANTIGEN 19-9: CA 19-9: 28405 U/mL — ABNORMAL HIGH (ref 0–35)

## 2023-09-27 MED ORDER — HEPARIN SOD (PORK) LOCK FLUSH 100 UNIT/ML IV SOLN
500.0000 [IU] | Freq: Once | INTRAVENOUS | Status: AC | PRN
  Administered 2023-09-27: 500 [IU]

## 2023-09-27 MED ORDER — PEGFILGRASTIM-CBQV 6 MG/0.6ML ~~LOC~~ SOSY
6.0000 mg | PREFILLED_SYRINGE | Freq: Once | SUBCUTANEOUS | Status: AC
  Administered 2023-09-27: 6 mg via SUBCUTANEOUS

## 2023-09-27 MED ORDER — SODIUM CHLORIDE 0.9% FLUSH
10.0000 mL | INTRAVENOUS | Status: DC | PRN
  Administered 2023-09-27: 10 mL

## 2023-09-27 NOTE — Patient Instructions (Signed)
 [facility] - A DEPT OF [facility]  Discharge Instructions: Thank you for choosing [facility] to provide your oncology and hematology care.   If you have a lab appointment with the Cancer Center, please go directly to the Cancer Center and check in at the registration area.   Wear comfortable clothing and clothing appropriate for easy access to any Portacath or PICC line.   We strive to give you quality time with your provider. You may need to reschedule your appointment if you arrive late (15 or more minutes).  Arriving late affects you and other patients whose appointments are after yours.  Also, if you miss three or more appointments without notifying the office, you may be dismissed from the clinic at the provider's discretion.      For prescription refill requests, have your pharmacy contact our office and allow 72 hours for refills to be completed.    Today you received the following pump stop and Udenyca  injection.  Pegfilgrastim  Injection What is this medication? PEGFILGRASTIM  (PEG fil gra stim) lowers the risk of infection in people who are receiving chemotherapy. It works by Systems analyst make more white blood cells, which protects your body from infection. It may also be used to help people who have been exposed to high doses of radiation. This medicine may be used for other purposes; ask your health care provider or pharmacist if you have questions. COMMON BRAND NAME(S): Fulphila , Fylnetra , Neulasta , Nyvepria , Stimufend , UDENYCA , UDENYCA  ONBODY, Ziextenzo  What should I tell my care team before I take this medication? They need to know if you have any of these conditions: Kidney disease Latex allergy Ongoing radiation therapy Sickle cell disease Skin reactions to acrylic adhesives (On-Body Injector only) An unusual or allergic reaction to pegfilgrastim , filgrastim, other medications, foods, dyes, or preservatives Pregnant or  trying to get pregnant Breast-feeding How should I use this medication? This medication is for injection under the skin. If you get this medication at home, you will be taught how to prepare and give the pre-filled syringe or how to use the On-body Injector. Refer to the patient Instructions for Use for detailed instructions. Use exactly as directed. Tell your care team immediately if you suspect that the On-body Injector may not have performed as intended or if you suspect the use of the On-body Injector resulted in a missed or partial dose. It is important that you put your used needles and syringes in a special sharps container. Do not put them in a trash can. If you do not have a sharps container, call your pharmacist or care team to get one. Talk to your care team about the use of this medication in children. While this medication may be prescribed for selected conditions, precautions do apply. Overdosage: If you think you have taken too much of this medicine contact a poison control center or emergency room at once. NOTE: This medicine is only for you. Do not share this medicine with others. What if I miss a dose? It is important not to miss your dose. Call your care team if you miss your dose. If you miss a dose due to an On-body Injector failure or leakage, a new dose should be administered as soon as possible using a single prefilled syringe for manual use. What may interact with this medication? Interactions have not been studied. This list may not describe all possible interactions. Give your health care provider a list of all the  medicines, herbs, non-prescription drugs, or dietary supplements you use. Also tell them if you smoke, drink alcohol, or use illegal drugs. Some items may interact with your medicine. What should I watch for while using this medication? Your condition will be monitored carefully while you are receiving this medication. You may need blood work done while you are  taking this medication. Talk to your care team about your risk of cancer. You may be more at risk for certain types of cancer if you take this medication. If you are going to need a MRI, CT scan, or other procedure, tell your care team that you are using this medication (On-Body Injector only). What side effects may I notice from receiving this medication? Side effects that you should report to your care team as soon as possible: Allergic reactions--skin rash, itching, hives, swelling of the face, lips, tongue, or throat Capillary leak syndrome--stomach or muscle pain, unusual weakness or fatigue, feeling faint or lightheaded, decrease in the amount of urine, swelling of the ankles, hands, or feet, trouble breathing High white blood cell level--fever, fatigue, trouble breathing, night sweats, change in vision, weight loss Inflammation of the aorta--fever, fatigue, back, chest, or stomach pain, severe headache Kidney injury (glomerulonephritis)--decrease in the amount of urine, red or dark brown urine, foamy or bubbly urine, swelling of the ankles, hands, or feet Shortness of breath or trouble breathing Spleen injury--pain in upper left stomach or shoulder Unusual bruising or bleeding Side effects that usually do not require medical attention (report to your care team if they continue or are bothersome): Bone pain Pain in the hands or feet This list may not describe all possible side effects. Call your doctor for medical advice about side effects. You may report side effects to FDA at 1-800-FDA-1088. Where should I keep my medication? Keep out of the reach of children. If you are using this medication at home, you will be instructed on how to store it. Throw away any unused medication after the expiration date on the label. NOTE: This sheet is a summary. It may not cover all possible information. If you have questions about this medicine, talk to your doctor, pharmacist, or health care  provider.  2024 Elsevier/Gold Standard (2021-02-05 00:00:00)  To help prevent nausea and vomiting after your treatment, we encourage you to take your nausea medication as directed.  BELOW ARE SYMPTOMS THAT SHOULD BE REPORTED IMMEDIATELY: *FEVER GREATER THAN 100.4 F (38 C) OR HIGHER *CHILLS OR SWEATING *NAUSEA AND VOMITING THAT IS NOT CONTROLLED WITH YOUR NAUSEA MEDICATION *UNUSUAL SHORTNESS OF BREATH *UNUSUAL BRUISING OR BLEEDING *URINARY PROBLEMS (pain or burning when urinating, or frequent urination) *BOWEL PROBLEMS (unusual diarrhea, constipation, pain near the anus) TENDERNESS IN MOUTH AND THROAT WITH OR WITHOUT PRESENCE OF ULCERS (sore throat, sores in mouth, or a toothache) UNUSUAL RASH, SWELLING OR PAIN  UNUSUAL VAGINAL DISCHARGE OR ITCHING   Items with * indicate a potential emergency and should be followed up as soon as possible or go to the Emergency Department if any problems should occur.  Please show the CHEMOTHERAPY ALERT CARD or IMMUNOTHERAPY ALERT CARD at check-in to the Emergency Department and triage nurse.  Should you have questions after your visit or need to cancel or reschedule your appointment, please contact [facility] - A DEPT OF [facility]  Dept: [phone]  and follow the prompts.  Office hours are 8:00 a.m. to 4:30 p.m. Monday - Friday. Please note that voicemails left after 4:00 p.m. may  not be returned until the following business day.  We are closed weekends and major holidays. You have access to a nurse at all times for urgent questions. Please call the main number to the clinic Dept: [phone] and follow the prompts.   For any non-urgent questions, you may also contact your provider using MyChart. We now offer e-Visits for anyone 46 and older to request care online for non-urgent symptoms. For details visit [email or web address].   Also download the MyChart app! Go to the app store, search MyChart, open the app,  select [place], and log in with your MyChart username and password.

## 2023-09-27 NOTE — Progress Notes (Signed)
 Patient presents for pump stop and Udenyca  injection. Patients port flushed without difficulty.  Good blood return noted with no bruising or swelling noted at site.  Band aid applied.    Patient tolerated injection in left arm well  with no complaints voiced.  Site clean and dry with no bruising or swelling noted.  No complaints of pain.  Discharged with vital signs stable and no signs or symptoms of distress noted.

## 2023-09-29 ENCOUNTER — Telehealth: Payer: Self-pay | Admitting: Internal Medicine

## 2023-09-29 ENCOUNTER — Telehealth: Payer: Self-pay

## 2023-09-29 DIAGNOSIS — E1165 Type 2 diabetes mellitus with hyperglycemia: Secondary | ICD-10-CM

## 2023-09-29 MED ORDER — METFORMIN HCL 500 MG PO TABS
500.0000 mg | ORAL_TABLET | Freq: Two times a day (BID) | ORAL | 3 refills | Status: AC
Start: 2023-09-29 — End: ?

## 2023-09-29 NOTE — Telephone Encounter (Unsigned)
 Copied from CRM [id number]. Topic: Clinical - Medication Refill >> Sep 29, 2023 12:22 PM Berneda FALCON wrote: Medication: Metformin  (this is not showing that I can see in her med list) PCP took her off this medication but she went back on it. Takes 500MG  2x a day (one in AM and one at night)  Has the patient contacted their pharmacy? Yes (Agent: If no, request that the patient contact the pharmacy for the refill. If patient does not wish to contact the pharmacy document the reason why and proceed with request.) (Agent: If yes, when and what did the pharmacy advise?)  This is the patient's preferred pharmacy:  [id number] [place], [place] - [address] [place] [address] Phone: [phone] Fax: [phone]  Is this the correct pharmacy for this prescription? Yes If no, delete pharmacy and type the correct one.   Has the prescription been filled recently? No  Is the patient out of the medication? No  Has the patient been seen for an appointment in the last year OR does the patient have an upcoming appointment? Yes  Can we respond through MyChart? Yes  Agent: Please be advised that Rx refills may take up to 3 business days. We ask that you follow-up with your pharmacy.

## 2023-09-29 NOTE — Addendum Note (Signed)
 Addended by: Merion Grimaldo G on: 09/29/2023 01:09 PM   Modules accepted: Orders

## 2023-09-29 NOTE — Telephone Encounter (Signed)
 Review and advise please  Copied from CRM [id number]. Topic: Clinical - Medication Question >> Sep 29, 2023 12:21 PM Berneda FALCON wrote: Reason for CRM: Pt states that Dr. Jesus took her off metformin . States she has kept her insulin  at 500 MG but has gone back on metformin  one in the morning and one at night. (100MG  per day total). Just wanted to make sure to let us  know. Is requesting refill for this as well.

## 2023-10-01 ENCOUNTER — Other Ambulatory Visit: Payer: Self-pay | Admitting: Internal Medicine

## 2023-10-01 DIAGNOSIS — E119 Type 2 diabetes mellitus without complications: Secondary | ICD-10-CM

## 2023-10-06 ENCOUNTER — Other Ambulatory Visit (HOSPITAL_BASED_OUTPATIENT_CLINIC_OR_DEPARTMENT_OTHER): Payer: Self-pay | Admitting: Oncology

## 2023-10-06 ENCOUNTER — Ambulatory Visit: Admitting: Internal Medicine

## 2023-10-06 ENCOUNTER — Other Ambulatory Visit: Payer: Self-pay | Admitting: Oncology

## 2023-10-06 DIAGNOSIS — C259 Malignant neoplasm of pancreas, unspecified: Secondary | ICD-10-CM

## 2023-10-06 DIAGNOSIS — Z1231 Encounter for screening mammogram for malignant neoplasm of breast: Secondary | ICD-10-CM

## 2023-10-10 MED ORDER — SODIUM CHLORIDE 0.9 % IV SOLN
2400.0000 mg/m2 | INTRAVENOUS | Status: DC
  Administered 2023-10-11: 5000 mg via INTRAVENOUS
  Filled 2023-10-10: qty 100

## 2023-10-11 ENCOUNTER — Inpatient Hospital Stay

## 2023-10-11 ENCOUNTER — Inpatient Hospital Stay (HOSPITAL_BASED_OUTPATIENT_CLINIC_OR_DEPARTMENT_OTHER): Admitting: Nurse Practitioner

## 2023-10-11 ENCOUNTER — Encounter: Payer: Self-pay | Admitting: Nurse Practitioner

## 2023-10-11 VITALS — BP 130/86 | HR 74 | Temp 98.0°F | Resp 18 | Ht 63.0 in | Wt 230.0 lb

## 2023-10-11 VITALS — BP 144/84 | HR 64 | Resp 18

## 2023-10-11 DIAGNOSIS — C259 Malignant neoplasm of pancreas, unspecified: Secondary | ICD-10-CM

## 2023-10-11 DIAGNOSIS — Z5111 Encounter for antineoplastic chemotherapy: Secondary | ICD-10-CM | POA: Diagnosis not present

## 2023-10-11 LAB — CBC WITH DIFFERENTIAL (CANCER CENTER ONLY)
Abs Immature Granulocytes: 0.05 K/uL (ref 0.00–0.07)
Basophils Absolute: 0 K/uL (ref 0.0–0.1)
Basophils Relative: 0 %
Eosinophils Absolute: 0.2 K/uL (ref 0.0–0.5)
Eosinophils Relative: 3 %
HCT: 32.7 % — ABNORMAL LOW (ref 36.0–46.0)
Hemoglobin: 10.8 g/dL — ABNORMAL LOW (ref 12.0–15.0)
Immature Granulocytes: 1 %
Lymphocytes Relative: 26 %
Lymphs Abs: 1.4 K/uL (ref 0.7–4.0)
MCH: 33.4 pg (ref 26.0–34.0)
MCHC: 33 g/dL (ref 30.0–36.0)
MCV: 101.2 fL — ABNORMAL HIGH (ref 80.0–100.0)
Monocytes Absolute: 0.4 K/uL (ref 0.1–1.0)
Monocytes Relative: 8 %
Neutro Abs: 3.2 K/uL (ref 1.7–7.7)
Neutrophils Relative %: 62 %
Platelet Count: 118 K/uL — ABNORMAL LOW (ref 150–400)
RBC: 3.23 MIL/uL — ABNORMAL LOW (ref 3.87–5.11)
RDW: 15.4 % (ref 11.5–15.5)
WBC Count: 5.2 K/uL (ref 4.0–10.5)
nRBC: 0 % (ref 0.0–0.2)

## 2023-10-11 LAB — CMP (CANCER CENTER ONLY)
ALT: 101 U/L — ABNORMAL HIGH (ref 0–44)
AST: 98 U/L — ABNORMAL HIGH (ref 15–41)
Albumin: 4.3 g/dL (ref 3.5–5.0)
Alkaline Phosphatase: 185 U/L — ABNORMAL HIGH (ref 38–126)
Anion gap: 12 (ref 5–15)
BUN: 11 mg/dL (ref 6–20)
CO2: 25 mmol/L (ref 22–32)
Calcium: 10.3 mg/dL (ref 8.9–10.3)
Chloride: 104 mmol/L (ref 98–111)
Creatinine: 0.8 mg/dL (ref 0.44–1.00)
GFR, Estimated: >60 mL/min (ref >60)
Glucose, Bld: 118 mg/dL — ABNORMAL HIGH (ref 70–99)
Potassium: 4.1 mmol/L (ref 3.5–5.1)
Sodium: 141 mmol/L (ref 135–145)
Total Bilirubin: 0.3 mg/dL (ref 0.0–1.2)
Total Protein: 7.4 g/dL (ref 6.5–8.1)

## 2023-10-11 LAB — HCG, SERUM, QUALITATIVE: Preg, Serum: NEGATIVE

## 2023-10-11 MED ORDER — SODIUM CHLORIDE 0.9 % IV SOLN
400.0000 mg/m2 | Freq: Once | INTRAVENOUS | Status: AC
  Administered 2023-10-11: 856 mg via INTRAVENOUS
  Filled 2023-10-11: qty 42.8

## 2023-10-11 MED ORDER — DEXAMETHASONE SODIUM PHOSPHATE 10 MG/ML IJ SOLN
10.0000 mg | Freq: Once | INTRAMUSCULAR | Status: AC
  Administered 2023-10-11: 10 mg via INTRAVENOUS
  Filled 2023-10-11: qty 1

## 2023-10-11 MED ORDER — PALONOSETRON HCL INJECTION 0.25 MG/5ML
0.2500 mg | Freq: Once | INTRAVENOUS | Status: AC
  Administered 2023-10-11: 0.25 mg via INTRAVENOUS
  Filled 2023-10-11: qty 5

## 2023-10-11 MED ORDER — ATROPINE SULFATE 1 MG/ML IV SOLN
0.5000 mg | Freq: Once | INTRAVENOUS | Status: AC | PRN
  Administered 2023-10-11: 0.5 mg via INTRAVENOUS
  Filled 2023-10-11: qty 1

## 2023-10-11 MED ORDER — SODIUM CHLORIDE 0.9 % IV SOLN
150.0000 mg | Freq: Once | INTRAVENOUS | Status: AC
  Administered 2023-10-11: 150 mg via INTRAVENOUS
  Filled 2023-10-11: qty 150

## 2023-10-11 MED ORDER — SODIUM CHLORIDE 0.9 % IV SOLN: INTRAVENOUS | Status: DC

## 2023-10-11 MED ORDER — SODIUM CHLORIDE 0.9 % IV SOLN
120.0000 mg/m2 | Freq: Once | INTRAVENOUS | Status: AC
  Administered 2023-10-11: 260 mg via INTRAVENOUS
  Filled 2023-10-11: qty 13

## 2023-10-11 NOTE — Progress Notes (Signed)
 Patient seen by Lacie Burton, NP today  Vitals are within treatment parameters:Yes   Labs are within treatment parameters: AST- 98, ALT-101 Okay to treat  Treatment plan has been signed: Yes   Per physician team, Patient is ready for treatment and there are NO modifications to the treatment plan.

## 2023-10-11 NOTE — Patient Instructions (Signed)

## 2023-10-11 NOTE — Progress Notes (Signed)
 [facility]   [phone] [phone]    Patient Care Team: Jesus Bernardino MATSU, MD as PCP - General (Internal Medicine) Nahser, Aleene PARAS, MD (Inactive) as PCP - Cardiology (Cardiology) Lorence Ozell CROME, MD (Inactive) as Consulting Physician (Obstetrics and Gynecology) Eda Baxter, MD as Referring Physician (Dermatology) Cloretta Arley NOVAK, MD as Consulting Physician (Oncology) Nori Sari SQUIBB, RN as Oncology Nurse Navigator   CHIEF COMPLAINT: Follow up pancreas cancer   Oncology History  Pancreatic adenocarcinoma [facility])  03/30/2023 Initial Diagnosis   Pancreatic adenocarcinoma (HCC)   03/31/2023 Cancer Staging   Staging form: Exocrine Pancreas, AJCC 8th Edition - Clinical: Stage IV (cT3, cN0, cM1) - Signed by Cloretta Arley NOVAK, MD on 03/31/2023 Total positive nodes: 0   04/10/2023 -  Chemotherapy   Patient is on Treatment Plan : PANCREAS Modified FOLFIRINOX q14d x 4 cycles     04/14/2023 Genetic Testing   Negative [facility] CancerNext-Expanded +RNAinsight Panel.  Report date is 04/14/2023.   The CancerNext-Expanded gene panel offered by [facility] and includes sequencing, rearrangement, and RNA analysis for the following 76 genes: AIP, ALK, APC, ATM, AXIN2, BAP1, BARD1, BMPR1A, BRCA1, BRCA2, BRIP1, CDC73, CDH1, CDK4, CDKN1B, CDKN2A, CEBPA, CHEK2, CTNNA1, DDX41, DICER1, ETV6, FH, FLCN, GATA2, LZTR1, MAX, MBD4, MEN1, MET, MLH1, MSH2, MSH3, MSH6, MUTYH, NF1, NF2, NTHL1, PALB2, PHOX2B, PMS2, POT1, PRKAR1A, PTCH1, PTEN, RAD51C, RAD51D, RB1, RET, RUNX1, SDHA, SDHAF2, SDHB, SDHC, SDHD, SMAD4, SMARCA4, SMARCB1, SMARCE1, STK11, SUFU, TMEM127, TP53, TSC1, TSC2, VHL, and WT1 (sequencing and deletion/duplication); EGFR, HOXB13, KIT, MITF, PDGFRA, POLD1, and POLE (sequencing only); EPCAM and GREM1 (deletion/duplication only).        CURRENT THERAPY: FOLFIRINOX, Oxali on hold due to CIPN  INTERVAL HISTORY Ms. Jabs returns for follow up and treatment, last  seen 09/25/23 with another cycle.  Tolerates treat well, similarly as with prior cycles.  She has mild nausea and diarrhea for a few days, meds are effective.  She does not vomit, able to eat and drink well.  Denies mucositis.  Energy improves on her week off and is not one to lay around.  Neuropathy is stable.  For the past month she has increased but intermittent pain at the right side/mid back, worse with lying on her right side.  She has not required medication.  Denies cough, chest pain, dyspnea, leg edema.  ROS  All other systems reviewed and negative  Past Medical History:  Diagnosis Date   Acute renal failure (ARF) (HCC) 10/01/2015   Allergy    Depression    DKA, type 2 (HCC) 03/01/2023   Elevated cholesterol    History of endometrial ablation 05/20/2021   2021   Hyperlipidemia    Hypertension    Hypertriglyceridemia 02/08/2022   No components found for: TG Lab Results Component Value Date/Time  TRIG 125.0 08/18/2021 08:53 AM  TRIG 163.0 (H) 12/29/2020 09:20 AM  TRIG 148.0 08/04/2020 08:24 AM  TRIG 177.0 (H) 04/03/2019 07:30 AM     Hypothyroidism    Insomnia 11/08/2021   Morbid obesity (HCC) 02/08/2019   New onset type 2 diabetes mellitus (HCC) 02/23/2023   Type 2 Diabetes Mellitus (E11.9) Onset: 02/23/2023 Key Events:  Initial A1c: 11.1% Presenting with polydipsia Current Treatment: Initiating Metformin  and Mounjaro  Monitoring: Blood glucose monitoring TID Follow-up in 2 weeks Quality Metrics: Eye exam needed Diabetes education pending Tags: #NewProblem #RequiresFollowUp #PatientEducation     Seasonal allergies    Sleep apnea    wears cpap  Thyroid disease    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 Fleeta Smock      Past Surgical History:  Procedure Laterality Date   ABLATION  10/2019   ADENOIDECTOMY     BIOPSY  03/27/2023   Procedure: BIOPSY;  Surgeon: Wilhelmenia Aloha Raddle., MD;  Location: [facility]  ENDOSCOPY;  Service: Gastroenterology;;   COLONOSCOPY     DENTAL SURGERY     graft of mouth    ESOPHAGOGASTRODUODENOSCOPY (EGD) WITH PROPOFOL  N/A 03/27/2023   Procedure: ESOPHAGOGASTRODUODENOSCOPY (EGD) WITH PROPOFOL ;  Surgeon: Wilhelmenia Aloha Raddle., MD;  Location: [facility] ENDOSCOPY;  Service: Gastroenterology;  Laterality: N/A;   EUS  03/27/2023   Procedure: UPPER ENDOSCOPIC ULTRASOUND (EUS) LINEAR;  Surgeon: Wilhelmenia Aloha Raddle., MD;  Location: [facility] ENDOSCOPY;  Service: Gastroenterology;;   FINE NEEDLE ASPIRATION  03/27/2023   Procedure: FINE NEEDLE ASPIRATION (FNA) LINEAR;  Surgeon: Wilhelmenia Aloha Raddle., MD;  Location: [facility] ENDOSCOPY;  Service: Gastroenterology;;   IR IMAGING GUIDED PORT INSERTION  04/03/2023     Outpatient Encounter Medications as of 10/11/2023  Medication Sig   ALPRAZolam  (XANAX ) 0.25 MG tablet Take 1 tablet (0.25 mg total) by mouth 2 (two) times daily as needed for anxiety.   Azelastine -Fluticasone  137-50 MCG/ACT SUSP PLACE 1 SPRAY INTO THE NOSE EVERY 12 (TWELVE) HOURS.   BD PEN NEEDLE NANO 2ND GEN 32G X 4 MM MISC USE AS DIRECTED 4 TIMES A DAY   Blood Glucose Monitoring Suppl DEVI 1 each by Does not apply route in the morning, at noon, and at bedtime. May substitute to any manufacturer covered by patient's insurance.   Continuous Glucose Sensor (DEXCOM G7 SENSOR) MISC 1 Act by Does not apply route daily.   escitalopram  (LEXAPRO ) 20 MG tablet TAKE 1 TABLET BY MOUTH EVERY DAY   Glucagon  (GVOKE HYPOPEN  2-PACK) 0.5 MG/0.1ML SOAJ Inject 0.5 mg into the skin daily as needed (for low blood sugar).   insulin  glargine-yfgn (SEMGLEE ) 100 UNIT/ML Pen INJECT 20 UNITS INTO THE SKIN DAILY   levocetirizine (XYZAL ) 5 MG tablet TAKE 1 TABLET BY MOUTH EVERY DAY IN THE EVENING   levothyroxine  (SYNTHROID ) 75 MCG tablet Take 1 tablet (75 mcg total) by mouth daily.   lidocaine -prilocaine  (EMLA ) cream Apply 1 Application topically as needed (Apply to port 1-2 hours prior to use).   loperamide  (IMODIUM) 2 MG capsule Take 2-4 mg by mouth as needed for diarrhea or loose stools.   losartan -hydrochlorothiazide  (HYZAAR) 50-12.5 MG tablet Take 0.5 tablets by mouth daily. Ok to start with just half tablet daily for week 1 and leave it there if goal of 140/90 reached.   magnesium  oxide (MAG-OX) 400 MG tablet Take 1 tablet by mouth 2 (two) times daily.   metFORMIN  (GLUCOPHAGE ) 500 MG tablet Take 1 tablet (500 mg total) by mouth 2 (two) times daily with a meal.   montelukast  (SINGULAIR ) 10 MG tablet Take 1 tablet by mouth every evening.   ondansetron  (ZOFRAN -ODT) 8 MG disintegrating tablet Take 1 tablet (8 mg total) by mouth every 8 (eight) hours as needed for nausea or vomiting.   pantoprazole  (PROTONIX ) 40 MG tablet Take 1 tablet (40 mg total) by mouth 2 (two) times daily.   potassium chloride  SA (KLOR-CON  M) 20 MEQ tablet TAKE 1 TABLET BY MOUTH EVERY DAY   prochlorperazine  (COMPAZINE ) 10 MG tablet Take 1 tablet (10 mg total) by mouth every 6 (six) hours as needed for nausea or vomiting.   Facility-Administered Encounter Medications as of 10/11/2023  Medication   fluorouracil  (ADRUCIL ) 5,000 mg in sodium chloride  0.9 % 150 mL chemo infusion     Today's Vitals   10/11/23 0824 10/11/23 0825 10/11/23 0829 10/11/23 0838  BP: (!) 161/87 (!) 156/89  130/86  Pulse: 74     Resp: 18     Temp: 98 F (36.7 C)     TempSrc: Temporal     SpO2: 98%     Weight: 230 lb (104.3 kg)     Height: 5' 3 (1.6 m)     PainSc:   0-No pain    Body mass index is 40.74 kg/m.   ECOG PERFORMANCE STATUS: 1 - Symptomatic but completely ambulatory  PHYSICAL EXAM GENERAL:alert, no distress and comfortable SKIN: no rash  EYES: sclera clear NECK: without mass LYMPH:  no palpable cervical or supraclavicular lymphadenopathy  LUNGS: clear with normal breathing effort HEART: regular rate & rhythm, no lower extremity edema ABDOMEN: abdomen soft, non-tender and normal bowel sounds. No RUQ ttp or hepatomegaly.   NEURO: alert & oriented x 3 with fluent speech, no focal motor/sensory deficits MSK; no TTP in the right scapular region PAC without erythema    CBC    Latest Ref Rng & Units 10/11/2023    8:02 AM 09/25/2023    9:36 AM 09/11/2023    8:01 AM  CBC  WBC 4.0 - 10.5 K/uL 5.2  5.4  6.0   Hemoglobin 12.0 - 15.0 g/dL 89.1  88.4  88.9   Hematocrit 36.0 - 46.0 % 32.7  33.8  32.1   Platelets 150 - 400 K/uL 118  174  151       CMP     Latest Ref Rng & Units 10/11/2023    8:02 AM 09/25/2023    9:36 AM 09/11/2023    8:01 AM  CMP  Glucose 70 - 99 mg/dL 881  886  882   BUN 6 - 20 mg/dL 11  10  12    Creatinine 0.44 - 1.00 mg/dL 9.19  9.28  9.28   Sodium 135 - 145 mmol/L 141  141  140   Potassium 3.5 - 5.1 mmol/L 4.1  4.0  4.0   Chloride 98 - 111 mmol/L 104  104  102   CO2 22 - 32 mmol/L 25  25  24    Calcium  8.9 - 10.3 mg/dL 89.6  89.3  89.9   Total Protein 6.5 - 8.1 g/dL 7.4  7.7  7.3   Total Bilirubin 0.0 - 1.2 mg/dL 0.3  0.3  0.3   Alkaline Phos 38 - 126 U/L 185  228  225   AST 15 - 41 U/L 98  94  99   ALT 0 - 44 U/L 101  109  96       ASSESSMENT & PLAN:  Pancreas cancer CT abdomen/pelvis 03/24/2023: 3 x 4.6 cm pancreas tail mass, multiple liver lesions consistent with metastases, fat stranding surrounding the second and third ports of the duodenum and pancreas head/uncinate  CT chest 03/27/2023: New spiculated nodular density in the right lower lobe, 8 x 4 mm EUS 03/27/2023: Pancreas tail mass, liver lesions, T2 N0 M1, needle biopsy of pancreas mass, adenocarcinoma Foundation 1: K-ras G 12R, HRD signature negative, MSS, tumor mutation burden 0, PD-L1 TPS 1% Elevated CA 19-9 (121,720 on 03/26/2023) Cycle 1 FOLFIRINOX 04/10/2023 Chemotherapy held 04/24/2023 due to neutropenia Cycle 2 FOLFIRINOX 05/04/2023, Udenyca  Cycle 3 FOLFIRINOX 05/22/2023, Udenyca  Cycle 4 FOLFIRINOX 06/05/2023, Udenyca  Cycle 5 FOLFIRINOX 06/19/2023, Udenyca  CTs 06/26/2023:  Stable pancreas mass, slight increase in size and number  of hepatic metastases, improved ill-defined right lower lobe nodule, new nonspecific small groundglass nodules, improved stranding at the pancreas head/neck (review of CTs with patient-pancreas mass appears slightly smaller, several liver lesions are slightly larger others measured stable or slightly smaller Cycle 6 FOLFIRINOX 07/04/2023, Udenyca  Cycle 7 FOLFIRINOX 07/17/2023, Udenyca ; oxaliplatin  held due to neuropathy and thrombocytopenia Cycle 8 FOLFIRINOX 07/31/2023, Udenyca ; oxaliplatin  held due to neuropathy, irinotecan  dose reduced due to elevated AST/ALT. CTs 08/09/2023-improvement in hepatic metastases.  Decrease in size of pancreatic tail mass.  No evidence of metastatic disease within the chest. Cycle 9 FOLFIRINOX 08/16/2023, Udenyca ; oxaliplatin  held due to neuropathy Cycle 10 FOLFIRINOX 08/29/2023, Udenyca , oxaliplatin  held due to neuropathy Cycle 11 FOLFIRINOX 09/11/2023, Udenyca , oxaliplatin  held due to neuropathy Cycle 12 FOLFIRINOX 09/25/2023, Udenyca , oxaliplatin  held due to neuropathy Cycle 13 FOLFIRINOX 10/11/2023, Udenyca , oxaliplatin  held for neuropathy    2.  Diabetes 3.  Acute abdominal pain and nausea/vomiting 03/23/2023 secondary to pancreatitis.  Improved 03/31/2023 4.  Hypertension. 5.  Asthma 6.  Hypothyroidism 7.  Hyperlipidemia 8.  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 Bloodgood appears stable, s/p cycle 12 FOLFIRINOX, oxaliplatin  remains on hold for CRP and which is stable.  She tolerates treatment well with mild nausea and diarrhea, side effects are adequately managed with supportive care at home.  She is able to recover and function well with good performance status.  Pain in the right side/mid back is possibly secondary to known posterior liver metastasis.  She had no osseous lesions on previous imaging.  No pulmonary symptoms or signs of thrombosis.  We reviewed  symptom management.  She is scheduled for restaging CT on 7/31.   Labs reviewed, adequate to proceed with cycle 13 FOLFIRINOX today, continue holding Oxaliplatin .  No other dose modifications.  Follow up and next cycle in 2 weeks.   All questions were answered. The patient knows to call the clinic with any problems, questions or concerns. No barriers to learning were detected. I spent 20 minutes counseling the patient face to face. The total time spent in the appointment was 30 minutes and more than 50% was on counseling, review of test results, and coordination of care.    Tinea Nobile K Elery Cadenhead, NP 10/11/2023

## 2023-10-11 NOTE — Patient Instructions (Signed)
 [facility] - A DEPT OF [facility]  Discharge Instructions: Thank you for choosing [facility] to provide your oncology and hematology care.   If you have a lab appointment with the Cancer Center, please go directly to the Cancer Center and check in at the registration area.   Wear comfortable clothing and clothing appropriate for easy access to any Portacath or PICC line.   We strive to give you quality time with your provider. You may need to reschedule your appointment if you arrive late (15 or more minutes).  Arriving late affects you and other patients whose appointments are after yours.  Also, if you miss three or more appointments without notifying the office, you may be dismissed from the clinic at the provider's discretion.      For prescription refill requests, have your pharmacy contact our office and allow 72 hours for refills to be completed.    Today you received the following chemotherapy and/or immunotherapy agents Irinotecan , Leucovorin  and Adrucil       To help prevent nausea and vomiting after your treatment, we encourage you to take your nausea medication as directed.  BELOW ARE SYMPTOMS THAT SHOULD BE REPORTED IMMEDIATELY: *FEVER GREATER THAN 100.4 F (38 C) OR HIGHER *CHILLS OR SWEATING *NAUSEA AND VOMITING THAT IS NOT CONTROLLED WITH YOUR NAUSEA MEDICATION *UNUSUAL SHORTNESS OF BREATH *UNUSUAL BRUISING OR BLEEDING *URINARY PROBLEMS (pain or burning when urinating, or frequent urination) *BOWEL PROBLEMS (unusual diarrhea, constipation, pain near the anus) TENDERNESS IN MOUTH AND THROAT WITH OR WITHOUT PRESENCE OF ULCERS (sore throat, sores in mouth, or a toothache) UNUSUAL RASH, SWELLING OR PAIN  UNUSUAL VAGINAL DISCHARGE OR ITCHING   Items with * indicate a potential emergency and should be followed up as soon as possible or go to the Emergency Department if any problems should occur.  Please show the CHEMOTHERAPY  ALERT CARD or IMMUNOTHERAPY ALERT CARD at check-in to the Emergency Department and triage nurse.  Should you have questions after your visit or need to cancel or reschedule your appointment, please contact [facility] - A DEPT OF [facility]  Dept: [phone]  and follow the prompts.  Office hours are 8:00 a.m. to 4:30 p.m. Monday - Friday. Please note that voicemails left after 4:00 p.m. may not be returned until the following business day.  We are closed weekends and major holidays. You have access to a nurse at all times for urgent questions. Please call the main number to the clinic Dept: [phone] and follow the prompts.   For any non-urgent questions, you may also contact your provider using MyChart. We now offer e-Visits for anyone 46 and older to request care online for non-urgent symptoms. For details visit [email or web address].   Also download the MyChart app! Go to the app store, search MyChart, open the app, select [place], and log in with your MyChart username and password.

## 2023-10-12 ENCOUNTER — Other Ambulatory Visit: Payer: Self-pay

## 2023-10-12 LAB — CANCER ANTIGEN 19-9: CA 19-9: 21583 U/mL — ABNORMAL HIGH (ref 0–35)

## 2023-10-13 ENCOUNTER — Other Ambulatory Visit: Payer: Self-pay

## 2023-10-13 ENCOUNTER — Inpatient Hospital Stay

## 2023-10-13 VITALS — BP 132/84 | HR 67 | Temp 97.9°F | Resp 18

## 2023-10-13 DIAGNOSIS — C259 Malignant neoplasm of pancreas, unspecified: Secondary | ICD-10-CM

## 2023-10-13 DIAGNOSIS — Z5111 Encounter for antineoplastic chemotherapy: Secondary | ICD-10-CM | POA: Diagnosis not present

## 2023-10-13 MED ORDER — SODIUM CHLORIDE 0.9% FLUSH
10.0000 mL | INTRAVENOUS | Status: DC | PRN
  Administered 2023-10-13: 10 mL

## 2023-10-13 MED ORDER — PEGFILGRASTIM-CBQV 6 MG/0.6ML ~~LOC~~ SOSY
6.0000 mg | PREFILLED_SYRINGE | Freq: Once | SUBCUTANEOUS | Status: AC
  Administered 2023-10-13: 6 mg via SUBCUTANEOUS
  Filled 2023-10-13: qty 0.6

## 2023-10-13 MED ORDER — HEPARIN SOD (PORK) LOCK FLUSH 100 UNIT/ML IV SOLN
500.0000 [IU] | Freq: Once | INTRAVENOUS | Status: AC | PRN
  Administered 2023-10-13: 500 [IU]

## 2023-10-13 NOTE — Progress Notes (Signed)
 Patient presents today for pump stop and Udenyca  injection. Pump removed without complications. Patients port flushed without difficulty.  Good blood return noted with no bruising or swelling noted at site.  Band aid applied.    Patient tolerated injection in left arm with no complaints voiced.  Site clean and dry with no bruising or swelling noted.  No complaints of pain.  Discharged with vital signs stable and no signs or symptoms of distress noted.

## 2023-10-13 NOTE — Patient Instructions (Signed)
 [facility] - A DEPT OF [facility]  Discharge Instructions: Thank you for choosing  Cancer Center to provide your oncology and hematology care.   If you have a lab appointment with the Cancer Center, please go directly to the Cancer Center and check in at the registration area.   Wear comfortable clothing and clothing appropriate for easy access to any Portacath or PICC line.   We strive to give you quality time with your provider. You may need to reschedule your appointment if you arrive late (15 or more minutes).  Arriving late affects you and other patients whose appointments are after yours.  Also, if you miss three or more appointments without notifying the office, you may be dismissed from the clinic at the provider's discretion.      For prescription refill requests, have your pharmacy contact our office and allow 72 hours for refills to be completed.    Today you received the following Pump Stop and Udenyca  injection.   Pegfilgrastim  Injection What is this medication? PEGFILGRASTIM  (PEG fil gra stim) lowers the risk of infection in people who are receiving chemotherapy. It works by Systems analyst make more white blood cells, which protects your body from infection. It may also be used to help people who have been exposed to high doses of radiation. This medicine may be used for other purposes; ask your health care provider or pharmacist if you have questions. COMMON BRAND NAME(S): Fulphila , Fylnetra , Neulasta , Nyvepria , Stimufend , UDENYCA , UDENYCA  ONBODY, Ziextenzo  What should I tell my care team before I take this medication? They need to know if you have any of these conditions: Kidney disease Latex allergy Ongoing radiation therapy Sickle cell disease Skin reactions to acrylic adhesives (On-Body Injector only) An unusual or allergic reaction to pegfilgrastim , filgrastim, other medications, foods, dyes, or preservatives Pregnant or  trying to get pregnant Breast-feeding How should I use this medication? This medication is for injection under the skin. If you get this medication at home, you will be taught how to prepare and give the pre-filled syringe or how to use the On-body Injector. Refer to the patient Instructions for Use for detailed instructions. Use exactly as directed. Tell your care team immediately if you suspect that the On-body Injector may not have performed as intended or if you suspect the use of the On-body Injector resulted in a missed or partial dose. It is important that you put your used needles and syringes in a special sharps container. Do not put them in a trash can. If you do not have a sharps container, call your pharmacist or care team to get one. Talk to your care team about the use of this medication in children. While this medication may be prescribed for selected conditions, precautions do apply. Overdosage: If you think you have taken too much of this medicine contact a poison control center or emergency room at once. NOTE: This medicine is only for you. Do not share this medicine with others. What if I miss a dose? It is important not to miss your dose. Call your care team if you miss your dose. If you miss a dose due to an On-body Injector failure or leakage, a new dose should be administered as soon as possible using a single prefilled syringe for manual use. What may interact with this medication? Interactions have not been studied. This list may not describe all possible interactions. Give your health care provider a list of all  the medicines, herbs, non-prescription drugs, or dietary supplements you use. Also tell them if you smoke, drink alcohol, or use illegal drugs. Some items may interact with your medicine. What should I watch for while using this medication? Your condition will be monitored carefully while you are receiving this medication. You may need blood work done while you are  taking this medication. Talk to your care team about your risk of cancer. You may be more at risk for certain types of cancer if you take this medication. If you are going to need a MRI, CT scan, or other procedure, tell your care team that you are using this medication (On-Body Injector only). What side effects may I notice from receiving this medication? Side effects that you should report to your care team as soon as possible: Allergic reactions--skin rash, itching, hives, swelling of the face, lips, tongue, or throat Capillary leak syndrome--stomach or muscle pain, unusual weakness or fatigue, feeling faint or lightheaded, decrease in the amount of urine, swelling of the ankles, hands, or feet, trouble breathing High white blood cell level--fever, fatigue, trouble breathing, night sweats, change in vision, weight loss Inflammation of the aorta--fever, fatigue, back, chest, or stomach pain, severe headache Kidney injury (glomerulonephritis)--decrease in the amount of urine, red or dark brown urine, foamy or bubbly urine, swelling of the ankles, hands, or feet Shortness of breath or trouble breathing Spleen injury--pain in upper left stomach or shoulder Unusual bruising or bleeding Side effects that usually do not require medical attention (report to your care team if they continue or are bothersome): Bone pain Pain in the hands or feet This list may not describe all possible side effects. Call your doctor for medical advice about side effects. You may report side effects to FDA at 1-800-FDA-1088. Where should I keep my medication? Keep out of the reach of children. If you are using this medication at home, you will be instructed on how to store it. Throw away any unused medication after the expiration date on the label. NOTE: This sheet is a summary. It may not cover all possible information. If you have questions about this medicine, talk to your doctor, pharmacist, or health care  provider.  2024 Elsevier/Gold Standard (2021-02-05 00:00:00)   To help prevent nausea and vomiting after your treatment, we encourage you to take your nausea medication as directed.  BELOW ARE SYMPTOMS THAT SHOULD BE REPORTED IMMEDIATELY: *FEVER GREATER THAN 100.4 F (38 C) OR HIGHER *CHILLS OR SWEATING *NAUSEA AND VOMITING THAT IS NOT CONTROLLED WITH YOUR NAUSEA MEDICATION *UNUSUAL SHORTNESS OF BREATH *UNUSUAL BRUISING OR BLEEDING *URINARY PROBLEMS (pain or burning when urinating, or frequent urination) *BOWEL PROBLEMS (unusual diarrhea, constipation, pain near the anus) TENDERNESS IN MOUTH AND THROAT WITH OR WITHOUT PRESENCE OF ULCERS (sore throat, sores in mouth, or a toothache) UNUSUAL RASH, SWELLING OR PAIN  UNUSUAL VAGINAL DISCHARGE OR ITCHING   Items with * indicate a potential emergency and should be followed up as soon as possible or go to the Emergency Department if any problems should occur.  Please show the CHEMOTHERAPY ALERT CARD or IMMUNOTHERAPY ALERT CARD at check-in to the Emergency Department and triage nurse.  Should you have questions after your visit or need to cancel or reschedule your appointment, please contact [facility] - A DEPT OF [facility]  Dept: [phone]  and follow the prompts.  Office hours are 8:00 a.m. to 4:30 p.m. Monday - Friday. Please note that voicemails left after 4:00  p.m. may not be returned until the following business day.  We are closed weekends and major holidays. You have access to a nurse at all times for urgent questions. Please call the main number to the clinic Dept: [phone] and follow the prompts.   For any non-urgent questions, you may also contact your provider using MyChart. We now offer e-Visits for anyone 61 and older to request care online for non-urgent symptoms. For details visit [email or web address].   Also download the MyChart app! Go to the app store, search MyChart, open the app,  select [facility], and log in with your MyChart username and password.

## 2023-10-16 ENCOUNTER — Ambulatory Visit: Admitting: Internal Medicine

## 2023-10-17 ENCOUNTER — Encounter (HOSPITAL_BASED_OUTPATIENT_CLINIC_OR_DEPARTMENT_OTHER): Payer: Self-pay | Admitting: Internal Medicine

## 2023-10-17 ENCOUNTER — Ambulatory Visit (HOSPITAL_BASED_OUTPATIENT_CLINIC_OR_DEPARTMENT_OTHER)
Admission: RE | Admit: 2023-10-17 | Discharge: 2023-10-17 | Disposition: A | Discharge status: Home / Self Care | Source: Ambulatory Visit | Attending: Oncology | Admitting: Oncology

## 2023-10-17 ENCOUNTER — Encounter (HOSPITAL_BASED_OUTPATIENT_CLINIC_OR_DEPARTMENT_OTHER): Payer: Self-pay | Admitting: Radiology

## 2023-10-17 DIAGNOSIS — Z1231 Encounter for screening mammogram for malignant neoplasm of breast: Secondary | ICD-10-CM | POA: Insufficient documentation

## 2023-10-19 ENCOUNTER — Ambulatory Visit (HOSPITAL_BASED_OUTPATIENT_CLINIC_OR_DEPARTMENT_OTHER)
Admission: RE | Admit: 2023-10-19 | Discharge: 2023-10-19 | Disposition: A | Discharge status: Home / Self Care | Source: Ambulatory Visit | Attending: Nurse Practitioner | Admitting: Nurse Practitioner

## 2023-10-19 DIAGNOSIS — C259 Malignant neoplasm of pancreas, unspecified: Secondary | ICD-10-CM | POA: Diagnosis present

## 2023-10-19 MED ORDER — IOHEXOL 300 MG/ML  SOLN
100.0000 mL | Freq: Once | INTRAMUSCULAR | Status: AC | PRN
  Administered 2023-10-19: 100 mL via INTRAVENOUS

## 2023-10-21 ENCOUNTER — Other Ambulatory Visit: Payer: Self-pay | Admitting: Oncology

## 2023-10-23 ENCOUNTER — Ambulatory Visit: Admitting: Oncology

## 2023-10-24 ENCOUNTER — Inpatient Hospital Stay (HOSPITAL_BASED_OUTPATIENT_CLINIC_OR_DEPARTMENT_OTHER): Admitting: Oncology

## 2023-10-24 ENCOUNTER — Inpatient Hospital Stay

## 2023-10-24 ENCOUNTER — Inpatient Hospital Stay: Attending: Nurse Practitioner

## 2023-10-24 VITALS — BP 140/87 | HR 70 | Resp 16

## 2023-10-24 VITALS — BP 139/84 | HR 80 | Temp 97.9°F | Resp 18 | Ht 63.0 in | Wt 228.5 lb

## 2023-10-24 DIAGNOSIS — C259 Malignant neoplasm of pancreas, unspecified: Secondary | ICD-10-CM

## 2023-10-24 DIAGNOSIS — C787 Secondary malignant neoplasm of liver and intrahepatic bile duct: Secondary | ICD-10-CM | POA: Insufficient documentation

## 2023-10-24 DIAGNOSIS — Z79631 Long term (current) use of antimetabolite agent: Secondary | ICD-10-CM | POA: Diagnosis not present

## 2023-10-24 DIAGNOSIS — Z79634 Long term (current) use of topoisomerase inhibitor: Secondary | ICD-10-CM | POA: Diagnosis not present

## 2023-10-24 DIAGNOSIS — Z5111 Encounter for antineoplastic chemotherapy: Secondary | ICD-10-CM | POA: Diagnosis present

## 2023-10-24 DIAGNOSIS — C252 Malignant neoplasm of tail of pancreas: Secondary | ICD-10-CM | POA: Insufficient documentation

## 2023-10-24 DIAGNOSIS — Z5189 Encounter for other specified aftercare: Secondary | ICD-10-CM | POA: Diagnosis not present

## 2023-10-24 LAB — CBC WITH DIFFERENTIAL (CANCER CENTER ONLY)
Abs Immature Granulocytes: 0.12 K/uL — ABNORMAL HIGH (ref 0.00–0.07)
Basophils Absolute: 0 K/uL (ref 0.0–0.1)
Basophils Relative: 0 %
Eosinophils Absolute: 0.2 K/uL (ref 0.0–0.5)
Eosinophils Relative: 3 %
HCT: 33.6 % — ABNORMAL LOW (ref 36.0–46.0)
Hemoglobin: 11.2 g/dL — ABNORMAL LOW (ref 12.0–15.0)
Immature Granulocytes: 2 %
Lymphocytes Relative: 22 %
Lymphs Abs: 1.6 K/uL (ref 0.7–4.0)
MCH: 33.6 pg (ref 26.0–34.0)
MCHC: 33.3 g/dL (ref 30.0–36.0)
MCV: 100.9 fL — ABNORMAL HIGH (ref 80.0–100.0)
Monocytes Absolute: 0.5 K/uL (ref 0.1–1.0)
Monocytes Relative: 7 %
Neutro Abs: 4.8 K/uL (ref 1.7–7.7)
Neutrophils Relative %: 66 %
Platelet Count: 157 K/uL (ref 150–400)
RBC: 3.33 MIL/uL — ABNORMAL LOW (ref 3.87–5.11)
RDW: 15.1 % (ref 11.5–15.5)
WBC Count: 7.3 K/uL (ref 4.0–10.5)
nRBC: 0 % (ref 0.0–0.2)

## 2023-10-24 LAB — CMP (CANCER CENTER ONLY)
ALT: 72 U/L — ABNORMAL HIGH (ref 0–44)
AST: 71 U/L — ABNORMAL HIGH (ref 15–41)
Albumin: 4.5 g/dL (ref 3.5–5.0)
Alkaline Phosphatase: 198 U/L — ABNORMAL HIGH (ref 38–126)
Anion gap: 15 (ref 5–15)
BUN: 13 mg/dL (ref 6–20)
CO2: 24 mmol/L (ref 22–32)
Calcium: 10.5 mg/dL — ABNORMAL HIGH (ref 8.9–10.3)
Chloride: 101 mmol/L (ref 98–111)
Creatinine: 0.77 mg/dL (ref 0.44–1.00)
GFR, Estimated: >60 mL/min (ref >60)
Glucose, Bld: 125 mg/dL — ABNORMAL HIGH (ref 70–99)
Potassium: 3.9 mmol/L (ref 3.5–5.1)
Sodium: 140 mmol/L (ref 135–145)
Total Bilirubin: 0.3 mg/dL (ref 0.0–1.2)
Total Protein: 7.3 g/dL (ref 6.5–8.1)

## 2023-10-24 LAB — HCG, SERUM, QUALITATIVE: Preg, Serum: NEGATIVE

## 2023-10-24 MED ORDER — SODIUM CHLORIDE 0.9 % IV SOLN: INTRAVENOUS | Status: DC

## 2023-10-24 MED ORDER — ATROPINE SULFATE 1 MG/ML IV SOLN
0.5000 mg | Freq: Once | INTRAVENOUS | Status: AC | PRN
  Administered 2023-10-24: 0.5 mg via INTRAVENOUS

## 2023-10-24 MED ORDER — SODIUM CHLORIDE 0.9 % IV SOLN
400.0000 mg/m2 | Freq: Once | INTRAVENOUS | Status: AC
  Administered 2023-10-24: 856 mg via INTRAVENOUS
  Filled 2023-10-24: qty 42.8

## 2023-10-24 MED ORDER — DEXTROSE 5 % IV SOLN: INTRAVENOUS | Status: DC

## 2023-10-24 MED ORDER — SODIUM CHLORIDE 0.9 % IV SOLN
2400.0000 mg/m2 | INTRAVENOUS | Status: DC
  Administered 2023-10-24: 5000 mg via INTRAVENOUS
  Filled 2023-10-24: qty 100

## 2023-10-24 MED ORDER — DEXAMETHASONE SODIUM PHOSPHATE 10 MG/ML IJ SOLN
10.0000 mg | Freq: Once | INTRAMUSCULAR | Status: AC
  Administered 2023-10-24: 10 mg via INTRAVENOUS
  Filled 2023-10-24: qty 1

## 2023-10-24 MED ORDER — PALONOSETRON HCL INJECTION 0.25 MG/5ML
0.2500 mg | Freq: Once | INTRAVENOUS | Status: AC
  Administered 2023-10-24: 0.25 mg via INTRAVENOUS
  Filled 2023-10-24: qty 5

## 2023-10-24 MED ORDER — SODIUM CHLORIDE 0.9 % IV SOLN
120.0000 mg/m2 | Freq: Once | INTRAVENOUS | Status: AC
  Administered 2023-10-24: 260 mg via INTRAVENOUS
  Filled 2023-10-24: qty 13

## 2023-10-24 MED ORDER — SODIUM CHLORIDE 0.9 % IV SOLN
150.0000 mg | Freq: Once | INTRAVENOUS | Status: AC
  Administered 2023-10-24: 150 mg via INTRAVENOUS
  Filled 2023-10-24: qty 150

## 2023-10-24 NOTE — Patient Instructions (Signed)
 [facility] - A DEPT OF [facility]  Discharge Instructions: Thank you for choosing [facility] to provide your oncology and hematology care.   If you have a lab appointment with the Cancer Center, please go directly to the Cancer Center and check in at the registration area.   Wear comfortable clothing and clothing appropriate for easy access to any Portacath or PICC line.   We strive to give you quality time with your provider. You may need to reschedule your appointment if you arrive late (15 or more minutes).  Arriving late affects you and other patients whose appointments are after yours.  Also, if you miss three or more appointments without notifying the office, you may be dismissed from the clinic at the provider's discretion.      For prescription refill requests, have your pharmacy contact our office and allow 72 hours for refills to be completed.    Today you received the following chemotherapy and/or immunotherapy agents Irinotecan , Leucovorin  and Adrucil       To help prevent nausea and vomiting after your treatment, we encourage you to take your nausea medication as directed.  BELOW ARE SYMPTOMS THAT SHOULD BE REPORTED IMMEDIATELY: *FEVER GREATER THAN 100.4 F (38 C) OR HIGHER *CHILLS OR SWEATING *NAUSEA AND VOMITING THAT IS NOT CONTROLLED WITH YOUR NAUSEA MEDICATION *UNUSUAL SHORTNESS OF BREATH *UNUSUAL BRUISING OR BLEEDING *URINARY PROBLEMS (pain or burning when urinating, or frequent urination) *BOWEL PROBLEMS (unusual diarrhea, constipation, pain near the anus) TENDERNESS IN MOUTH AND THROAT WITH OR WITHOUT PRESENCE OF ULCERS (sore throat, sores in mouth, or a toothache) UNUSUAL RASH, SWELLING OR PAIN  UNUSUAL VAGINAL DISCHARGE OR ITCHING   Items with * indicate a potential emergency and should be followed up as soon as possible or go to the Emergency Department if any problems should occur.  Please show the CHEMOTHERAPY  ALERT CARD or IMMUNOTHERAPY ALERT CARD at check-in to the Emergency Department and triage nurse.  Should you have questions after your visit or need to cancel or reschedule your appointment, please contact [facility] - A DEPT OF [facility]  Dept: [phone]  and follow the prompts.  Office hours are 8:00 a.m. to 4:30 p.m. Monday - Friday. Please note that voicemails left after 4:00 p.m. may not be returned until the following business day.  We are closed weekends and major holidays. You have access to a nurse at all times for urgent questions. Please call the main number to the clinic Dept: [phone] and follow the prompts.   For any non-urgent questions, you may also contact your provider using MyChart. We now offer e-Visits for anyone 46 and older to request care online for non-urgent symptoms. For details visit [email or web address].   Also download the MyChart app! Go to the app store, search MyChart, open the app, select [place], and log in with your MyChart username and password.

## 2023-10-24 NOTE — Patient Instructions (Signed)

## 2023-10-24 NOTE — Progress Notes (Signed)
 Patient seen by Dr. Arley Hof today  Vitals are within treatment parameters:Yes   Labs are within treatment parameters: No (Please specify and give further instructions.) Calcium  10.5--ok to proceed   Treatment plan has been signed: Yes   Per physician team, Patient is ready for treatment and there are NO modifications to the treatment plan.

## 2023-10-24 NOTE — Progress Notes (Signed)
 [facility] OFFICE PROGRESS NOTE   Diagnosis: Pancreas cancer  INTERVAL HISTORY:   Ms. Riedlinger returns as scheduled.  She completed another cycle of chemotherapy on 10/11/2023.  Mild nausea and diarrhea following chemotherapy.  No vomiting.  She has persistent neuropathy symptoms in the hands and feet.  She reports discomfort at the right posterior lateral chest when lying on the right side.  The pain is not consistent.  She is not taking pain medication.  No known trauma.  Objective:  Vital signs in last 24 hours:  Blood pressure 139/84, pulse 80, temperature 97.9 F (36.6 C), temperature source Temporal, resp. rate 18, height 5' 3 (1.6 m), weight 228 lb 8 oz (103.6 kg), SpO2 98%.    HEENT: No thrush or ulcers Resp: Lungs clear bilaterally Cardio: Regular rate and rhythm GI: No hepatosplenomegaly, nontender, no mass Vascular: No leg edema Musculoskeletal: No mass or tenderness at the right posterior lateral chest wall  Portacath/PICC-without erythema  Lab Results:  Lab Results  Component Value Date   WBC 5.2 10/11/2023   HGB 10.8 (L) 10/11/2023   HCT 32.7 (L) 10/11/2023   MCV 101.2 (H) 10/11/2023   PLT 118 (L) 10/11/2023   NEUTROABS 3.2 10/11/2023    CMP  Lab Results  Component Value Date   NA 141 10/11/2023   K 4.1 10/11/2023   CL 104 10/11/2023   CO2 25 10/11/2023   GLUCOSE 118 (H) 10/11/2023   BUN 11 10/11/2023   CREATININE 0.80 10/11/2023   CALCIUM  10.3 10/11/2023   PROT 7.4 10/11/2023   ALBUMIN 4.3 10/11/2023   AST 98 (H) 10/11/2023   ALT 101 (H) 10/11/2023   ALKPHOS 185 (H) 10/11/2023   BILITOT 0.3 10/11/2023   GFRNONAA >60 10/11/2023   GFRAA >60 10/03/2015    Lab Results  Component Value Date   RJW800 21,583 (H) 10/11/2023     Medications: I have reviewed the patient's current medications.   Assessment/Plan:  Pancreas cancer CT abdomen/pelvis 03/24/2023: 3 x 4.6 cm pancreas tail mass, multiple liver lesions consistent with  metastases, fat stranding surrounding the second and third ports of the duodenum and pancreas head/uncinate  CT chest 03/27/2023: New spiculated nodular density in the right lower lobe, 8 x 4 mm EUS 03/27/2023: Pancreas tail mass, liver lesions, T2 N0 M1, needle biopsy of pancreas mass, adenocarcinoma Foundation 1: K-ras G 12R, HRD signature negative, MSS, tumor mutation burden 0, PD-L1 TPS 1% Elevated CA 19-9 (121,720 on 03/26/2023) Cycle 1 FOLFIRINOX 04/10/2023 Chemotherapy held 04/24/2023 due to neutropenia Cycle 2 FOLFIRINOX 05/04/2023, Udenyca  Cycle 3 FOLFIRINOX 05/22/2023, Udenyca  Cycle 4 FOLFIRINOX 06/05/2023, Udenyca  Cycle 5 FOLFIRINOX 06/19/2023, Udenyca  CTs 06/26/2023: Stable pancreas mass, slight increase in size and number of hepatic metastases, improved ill-defined right lower lobe nodule, new nonspecific small groundglass nodules, improved stranding at the pancreas head/neck (review of CTs with patient-pancreas mass appears slightly smaller, several liver lesions are slightly larger others measured stable or slightly smaller Cycle 6 FOLFIRINOX 07/04/2023, Udenyca  Cycle 7 FOLFIRINOX 07/17/2023, Udenyca ; oxaliplatin  held due to neuropathy and thrombocytopenia Cycle 8 FOLFIRINOX 07/31/2023, Udenyca ; oxaliplatin  held due to neuropathy, irinotecan  dose reduced due to elevated AST/ALT. CTs 08/09/2023-improvement in hepatic metastases.  Decrease in size of pancreatic tail mass.  No evidence of metastatic disease within the chest. Cycle 9 FOLFIRINOX 08/16/2023, Udenyca ; oxaliplatin  held due to neuropathy Cycle 10 FOLFIRINOX 08/29/2023, Udenyca , oxaliplatin  held due to neuropathy Cycle 11 FOLFIRINOX 09/11/2023, Udenyca , oxaliplatin  held due to neuropathy Cycle 12 FOLFIRINOX 09/25/2023, Udenyca , oxaliplatin  held  due to neuropathy Cycle 13 FOLFIRINOX 10/11/2023, Udenyca , oxaliplatin  held due to neuropathy  CTs 10/19/2023: Decrease in the pancreas tail mass, stable to slightly smaller hepatic lesions, no new lesions, 4  mm right lower lobe nodule present on 2 previous CTs Cycle 14 FOLFIRINOX 10/24/2023, Udenyca , oxaliplatin  held due to neuropathy   2.  Diabetes 3.  Acute abdominal pain and nausea/vomiting 03/23/2023 secondary to pancreatitis.  Improved 03/31/2023 4.  Hypertension. 5.  Asthma 6.  Hypothyroidism 7.  Hyperlipidemia 8.  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 Manseau appears stable.  I reviewed the restaging CT findings and images with Ms. Shepheard and her husband.  The CTs are consistent with improvement in the pancreas mass and liver metastases.  No evidence of disease progression.  The CA 19-9 was lower on 10/11/2023.  I recommend continuing FOLFIRINOX.  Oxaliplatin  will remain on hold due to neuropathy symptoms.  We will consider reintroducing oxaliplatin  if the CA 19-9 rises or restaging CT reveals evidence of disease progression, and the neuropathy symptoms improved.  Arley Hof, MD  10/24/2023  8:08 AM

## 2023-10-25 ENCOUNTER — Other Ambulatory Visit: Payer: Self-pay

## 2023-10-25 LAB — CANCER ANTIGEN 19-9: CA 19-9: 23772 U/mL — ABNORMAL HIGH (ref 0–35)

## 2023-10-26 ENCOUNTER — Inpatient Hospital Stay

## 2023-10-26 VITALS — BP 119/82 | HR 69 | Temp 97.9°F | Resp 16

## 2023-10-26 DIAGNOSIS — Z5111 Encounter for antineoplastic chemotherapy: Secondary | ICD-10-CM | POA: Diagnosis not present

## 2023-10-26 DIAGNOSIS — C259 Malignant neoplasm of pancreas, unspecified: Secondary | ICD-10-CM

## 2023-10-26 MED ORDER — PEGFILGRASTIM-CBQV 6 MG/0.6ML ~~LOC~~ SOSY
6.0000 mg | PREFILLED_SYRINGE | Freq: Once | SUBCUTANEOUS | Status: AC
  Administered 2023-10-26: 6 mg via SUBCUTANEOUS
  Filled 2023-10-26: qty 0.6

## 2023-10-26 NOTE — Patient Instructions (Signed)

## 2023-11-03 ENCOUNTER — Other Ambulatory Visit: Payer: Self-pay

## 2023-11-03 DIAGNOSIS — D649 Anemia, unspecified: Secondary | ICD-10-CM

## 2023-11-05 ENCOUNTER — Other Ambulatory Visit: Payer: Self-pay | Admitting: Oncology

## 2023-11-05 DIAGNOSIS — C259 Malignant neoplasm of pancreas, unspecified: Secondary | ICD-10-CM

## 2023-11-06 ENCOUNTER — Encounter: Payer: Self-pay | Admitting: Nurse Practitioner

## 2023-11-06 ENCOUNTER — Inpatient Hospital Stay (HOSPITAL_BASED_OUTPATIENT_CLINIC_OR_DEPARTMENT_OTHER): Admitting: Nurse Practitioner

## 2023-11-06 ENCOUNTER — Inpatient Hospital Stay

## 2023-11-06 VITALS — BP 142/81 | HR 72 | Temp 98.2°F | Resp 18 | Ht 63.0 in | Wt 229.4 lb

## 2023-11-06 DIAGNOSIS — Z5111 Encounter for antineoplastic chemotherapy: Secondary | ICD-10-CM | POA: Diagnosis not present

## 2023-11-06 DIAGNOSIS — C259 Malignant neoplasm of pancreas, unspecified: Secondary | ICD-10-CM | POA: Diagnosis not present

## 2023-11-06 LAB — CMP (CANCER CENTER ONLY)
ALT: 106 U/L — ABNORMAL HIGH (ref 0–44)
AST: 99 U/L — ABNORMAL HIGH (ref 15–41)
Albumin: 4.4 g/dL (ref 3.5–5.0)
Alkaline Phosphatase: 215 U/L — ABNORMAL HIGH (ref 38–126)
Anion gap: 15 (ref 5–15)
BUN: 12 mg/dL (ref 6–20)
CO2: 23 mmol/L (ref 22–32)
Calcium: 10.4 mg/dL — ABNORMAL HIGH (ref 8.9–10.3)
Chloride: 102 mmol/L (ref 98–111)
Creatinine: 0.7 mg/dL (ref 0.44–1.00)
GFR, Estimated: >60 mL/min (ref >60)
Glucose, Bld: 124 mg/dL — ABNORMAL HIGH (ref 70–99)
Potassium: 4 mmol/L (ref 3.5–5.1)
Sodium: 140 mmol/L (ref 135–145)
Total Bilirubin: 0.4 mg/dL (ref 0.0–1.2)
Total Protein: 7.3 g/dL (ref 6.5–8.1)

## 2023-11-06 LAB — CBC WITH DIFFERENTIAL (CANCER CENTER ONLY)
Abs Immature Granulocytes: 0.09 K/uL — ABNORMAL HIGH (ref 0.00–0.07)
Basophils Absolute: 0 K/uL (ref 0.0–0.1)
Basophils Relative: 1 %
Eosinophils Absolute: 0.2 K/uL (ref 0.0–0.5)
Eosinophils Relative: 2 %
HCT: 32.7 % — ABNORMAL LOW (ref 36.0–46.0)
Hemoglobin: 11.1 g/dL — ABNORMAL LOW (ref 12.0–15.0)
Immature Granulocytes: 1 %
Lymphocytes Relative: 23 %
Lymphs Abs: 1.5 K/uL (ref 0.7–4.0)
MCH: 34 pg (ref 26.0–34.0)
MCHC: 33.9 g/dL (ref 30.0–36.0)
MCV: 100.3 fL — ABNORMAL HIGH (ref 80.0–100.0)
Monocytes Absolute: 0.6 K/uL (ref 0.1–1.0)
Monocytes Relative: 9 %
Neutro Abs: 4.1 K/uL (ref 1.7–7.7)
Neutrophils Relative %: 64 %
Platelet Count: 152 K/uL (ref 150–400)
RBC: 3.26 MIL/uL — ABNORMAL LOW (ref 3.87–5.11)
RDW: 15.4 % (ref 11.5–15.5)
WBC Count: 6.5 K/uL (ref 4.0–10.5)
nRBC: 0 % (ref 0.0–0.2)

## 2023-11-06 LAB — HCG, SERUM, QUALITATIVE: Preg, Serum: NEGATIVE

## 2023-11-06 MED ORDER — SODIUM CHLORIDE 0.9 % IV SOLN
2400.0000 mg/m2 | INTRAVENOUS | Status: DC
  Administered 2023-11-06: 5000 mg via INTRAVENOUS
  Filled 2023-11-06: qty 100

## 2023-11-06 MED ORDER — SODIUM CHLORIDE 0.9 % IV SOLN
400.0000 mg/m2 | Freq: Once | INTRAVENOUS | Status: AC
  Administered 2023-11-06: 856 mg via INTRAVENOUS
  Filled 2023-11-06: qty 42.8

## 2023-11-06 MED ORDER — ATROPINE SULFATE 1 MG/ML IV SOLN
0.5000 mg | Freq: Once | INTRAVENOUS | Status: AC | PRN
  Administered 2023-11-06: 0.5 mg via INTRAVENOUS
  Filled 2023-11-06: qty 1

## 2023-11-06 MED ORDER — SODIUM CHLORIDE 0.9 % IV SOLN
120.0000 mg/m2 | Freq: Once | INTRAVENOUS | Status: AC
  Administered 2023-11-06: 260 mg via INTRAVENOUS
  Filled 2023-11-06: qty 13

## 2023-11-06 MED ORDER — SODIUM CHLORIDE 0.9 % IV SOLN: INTRAVENOUS | Status: DC

## 2023-11-06 MED ORDER — DEXAMETHASONE SODIUM PHOSPHATE 10 MG/ML IJ SOLN
10.0000 mg | Freq: Once | INTRAMUSCULAR | Status: AC
  Administered 2023-11-06: 10 mg via INTRAVENOUS
  Filled 2023-11-06: qty 1

## 2023-11-06 MED ORDER — SODIUM CHLORIDE 0.9 % IV SOLN
150.0000 mg | Freq: Once | INTRAVENOUS | Status: AC
  Administered 2023-11-06: 150 mg via INTRAVENOUS
  Filled 2023-11-06: qty 5

## 2023-11-06 MED ORDER — PALONOSETRON HCL INJECTION 0.25 MG/5ML
0.2500 mg | Freq: Once | INTRAVENOUS | Status: AC
  Administered 2023-11-06: 0.25 mg via INTRAVENOUS
  Filled 2023-11-06: qty 5

## 2023-11-06 NOTE — Progress Notes (Signed)
 Patient seen by Olam Ned NP today  Vitals are within treatment parameters:Yes   Labs are within treatment parameters: No (Please specify and give further instructions.) AST 99 , ALT 106 It's ok to proceed  Treatment plan has been signed: Yes   Per physician team, Patient is ready for treatment and there are NO modifications to the treatment plan.

## 2023-11-06 NOTE — Patient Instructions (Signed)
 [facility] - A DEPT OF [facility]  Discharge Instructions: Thank you for choosing [facility] to provide your oncology and hematology care.   If you have a lab appointment with the Cancer Center, please go directly to the Cancer Center and check in at the registration area.   Wear comfortable clothing and clothing appropriate for easy access to any Portacath or PICC line.   We strive to give you quality time with your provider. You may need to reschedule your appointment if you arrive late (15 or more minutes).  Arriving late affects you and other patients whose appointments are after yours.  Also, if you miss three or more appointments without notifying the office, you may be dismissed from the clinic at the provider's discretion.      For prescription refill requests, have your pharmacy contact our office and allow 72 hours for refills to be completed.    Today you received the following chemotherapy and/or immunotherapy agents Irinotecan , Leucovorin  and Adrucil       To help prevent nausea and vomiting after your treatment, we encourage you to take your nausea medication as directed.  BELOW ARE SYMPTOMS THAT SHOULD BE REPORTED IMMEDIATELY: *FEVER GREATER THAN 100.4 F (38 C) OR HIGHER *CHILLS OR SWEATING *NAUSEA AND VOMITING THAT IS NOT CONTROLLED WITH YOUR NAUSEA MEDICATION *UNUSUAL SHORTNESS OF BREATH *UNUSUAL BRUISING OR BLEEDING *URINARY PROBLEMS (pain or burning when urinating, or frequent urination) *BOWEL PROBLEMS (unusual diarrhea, constipation, pain near the anus) TENDERNESS IN MOUTH AND THROAT WITH OR WITHOUT PRESENCE OF ULCERS (sore throat, sores in mouth, or a toothache) UNUSUAL RASH, SWELLING OR PAIN  UNUSUAL VAGINAL DISCHARGE OR ITCHING   Items with * indicate a potential emergency and should be followed up as soon as possible or go to the Emergency Department if any problems should occur.  Please show the CHEMOTHERAPY  ALERT CARD or IMMUNOTHERAPY ALERT CARD at check-in to the Emergency Department and triage nurse.  Should you have questions after your visit or need to cancel or reschedule your appointment, please contact [facility] - A DEPT OF [facility]  Dept: [phone]  and follow the prompts.  Office hours are 8:00 a.m. to 4:30 p.m. Monday - Friday. Please note that voicemails left after 4:00 p.m. may not be returned until the following business day.  We are closed weekends and major holidays. You have access to a nurse at all times for urgent questions. Please call the main number to the clinic Dept: [phone] and follow the prompts.   For any non-urgent questions, you may also contact your provider using MyChart. We now offer e-Visits for anyone 46 and older to request care online for non-urgent symptoms. For details visit [email or web address].   Also download the MyChart app! Go to the app store, search MyChart, open the app, select [place], and log in with your MyChart username and password.

## 2023-11-06 NOTE — Progress Notes (Signed)
 [facility] OFFICE PROGRESS NOTE   Diagnosis: Pancreas cancer  INTERVAL HISTORY:   Debbie [name] returns as scheduled.  She completed another cycle of chemotherapy 10/24/2023.  Oxaliplatin  is on hold due to neuropathy.  She thinks neuropathy symptoms may be slightly better.  She has mild nausea for a few days after treatment.  No vomiting.  No mouth sores.  Periodic loose stools.  Objective:  Vital signs in last 24 hours:  Blood pressure (!) 142/81, pulse 72, temperature 98.2 F (36.8 C), temperature source Temporal, resp. rate 18, height 5' 3 (1.6 m), weight 229 lb 6.4 oz (104.1 kg), SpO2 98%.    HEENT: No thrush or ulcers. Resp: Lungs clear bilaterally. Cardio: Regular rate and rhythm. GI: Abdomen soft and nontender.  No hepatosplenomegaly.  No mass. Vascular: No leg edema. Skin: Palms without erythema. Port-A-Cath without erythema.  Lab Results:  Lab Results  Component Value Date   WBC 6.5 11/06/2023   HGB 11.1 (L) 11/06/2023   HCT 32.7 (L) 11/06/2023   MCV 100.3 (H) 11/06/2023   PLT 152 11/06/2023   NEUTROABS 4.1 11/06/2023    Imaging:  No results found.  Medications: I have reviewed the patient's current medications.  Assessment/Plan: Pancreas cancer CT abdomen/pelvis 03/24/2023: 3 x 4.6 cm pancreas tail mass, multiple liver lesions consistent with metastases, fat stranding surrounding the second and third ports of the duodenum and pancreas head/uncinate  CT chest 03/27/2023: New spiculated nodular density in the right lower lobe, 8 x 4 mm EUS 03/27/2023: Pancreas tail mass, liver lesions, T2 N0 M1, needle biopsy of pancreas mass, adenocarcinoma Foundation 1: K-ras G 12R, HRD signature negative, MSS, tumor mutation burden 0, PD-L1 TPS 1% Elevated CA 19-9 (121,720 on 03/26/2023) Cycle 1 FOLFIRINOX 04/10/2023 Chemotherapy held 04/24/2023 due to neutropenia Cycle 2 FOLFIRINOX 05/04/2023, Udenyca  Cycle 3 FOLFIRINOX 05/22/2023, Udenyca  Cycle 4 FOLFIRINOX  06/05/2023, Udenyca  Cycle 5 FOLFIRINOX 06/19/2023, Udenyca  CTs 06/26/2023: Stable pancreas mass, slight increase in size and number of hepatic metastases, improved ill-defined right lower lobe nodule, new nonspecific small groundglass nodules, improved stranding at the pancreas head/neck (review of CTs with patient-pancreas mass appears slightly smaller, several liver lesions are slightly larger others measured stable or slightly smaller Cycle 6 FOLFIRINOX 07/04/2023, Udenyca  Cycle 7 FOLFIRINOX 07/17/2023, Udenyca ; oxaliplatin  held due to neuropathy and thrombocytopenia Cycle 8 FOLFIRINOX 07/31/2023, Udenyca ; oxaliplatin  held due to neuropathy, irinotecan  dose reduced due to elevated AST/ALT. CTs 08/09/2023-improvement in hepatic metastases.  Decrease in size of pancreatic tail mass.  No evidence of metastatic disease within the chest. Cycle 9 FOLFIRINOX 08/16/2023, Udenyca ; oxaliplatin  held due to neuropathy Cycle 10 FOLFIRINOX 08/29/2023, Udenyca , oxaliplatin  held due to neuropathy Cycle 11 FOLFIRINOX 09/11/2023, Udenyca , oxaliplatin  held due to neuropathy Cycle 12 FOLFIRINOX 09/25/2023, Udenyca , oxaliplatin  held due to neuropathy Cycle 13 FOLFIRINOX 10/11/2023, Udenyca , oxaliplatin  held due to neuropathy  CTs 10/19/2023: Decrease in the pancreas tail mass, stable to slightly smaller hepatic lesions, no new lesions, 4 mm right lower lobe nodule present on 2 previous CTs Cycle 14 FOLFIRINOX 10/24/2023, Udenyca , oxaliplatin  held due to neuropathy Cycle 15 FOLFIRINOX 11/06/2023, Udenyca , oxaliplatin  held due to neuropathy   2.  Diabetes 3.  Acute abdominal pain and nausea/vomiting 03/23/2023 secondary to pancreatitis.  Improved 03/31/2023 4.  Hypertension. 5.  Asthma 6.  Hypothyroidism 7.  Hyperlipidemia 8.  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 Smeltz appears stable.  She has completed 14 cycles of  FOLFIRINOX.  Oxaliplatin   is on hold due to neuropathy.  Overall she is tolerating treatment well.  There is no clinical evidence of disease progression.  Plan to proceed with cycle 15 today as scheduled, continue to hold oxaliplatin .  CBC and chemistry panel reviewed.  Labs adequate for treatment.  Stable mild elevation of AST/ALT.  She will return for follow-up and treatment in 2 weeks.  We are available to see her sooner if needed.    Olam Ned ANP/GNP-BC   11/06/2023  8:16 AM

## 2023-11-06 NOTE — Patient Instructions (Signed)

## 2023-11-07 ENCOUNTER — Telehealth: Payer: Self-pay | Admitting: *Deleted

## 2023-11-07 LAB — CANCER ANTIGEN 19-9: CA 19-9: 14824 U/mL — ABNORMAL HIGH (ref 0–35)

## 2023-11-07 NOTE — Telephone Encounter (Signed)
 Notified Ms. Hizer of continued decrease in CA 19.9 marker. F/U as scheduled.

## 2023-11-08 ENCOUNTER — Inpatient Hospital Stay

## 2023-11-08 VITALS — BP 133/83 | HR 65 | Temp 98.2°F | Resp 18

## 2023-11-08 DIAGNOSIS — C259 Malignant neoplasm of pancreas, unspecified: Secondary | ICD-10-CM

## 2023-11-08 DIAGNOSIS — Z5111 Encounter for antineoplastic chemotherapy: Secondary | ICD-10-CM | POA: Diagnosis not present

## 2023-11-08 MED ORDER — PEGFILGRASTIM-CBQV 6 MG/0.6ML ~~LOC~~ SOSY
6.0000 mg | PREFILLED_SYRINGE | Freq: Once | SUBCUTANEOUS | Status: AC
  Administered 2023-11-08: 6 mg via SUBCUTANEOUS
  Filled 2023-11-08: qty 0.6

## 2023-11-08 NOTE — Patient Instructions (Signed)

## 2023-11-13 ENCOUNTER — Telehealth: Payer: Self-pay | Admitting: *Deleted

## 2023-11-13 ENCOUNTER — Other Ambulatory Visit: Payer: Self-pay | Admitting: Oncology

## 2023-11-13 NOTE — Telephone Encounter (Signed)
 Debbie [name] called to inquire if there was something else to take for diarrhea? Last tx 8/18 and by 8/20 pm started with loose stools, some large volume and some very small. She reports the Imodium 1 mg tablet constipates her when she takes it and is asking if there is something less strong? She has been able to stay well hydrated and is replacing electrolytes as well. Today is good so far w/no loose stools. Suggested she try taking 1/2 Imodium tablet instead of whole tablet. She agrees to try this next time. Denies any fever, blood in stool or NV. She will if issue does not resolve.

## 2023-11-16 ENCOUNTER — Other Ambulatory Visit: Payer: Self-pay

## 2023-11-16 DIAGNOSIS — C259 Malignant neoplasm of pancreas, unspecified: Secondary | ICD-10-CM

## 2023-11-17 ENCOUNTER — Encounter: Payer: Self-pay | Admitting: Oncology

## 2023-11-19 ENCOUNTER — Other Ambulatory Visit: Payer: Self-pay | Admitting: Oncology

## 2023-11-19 ENCOUNTER — Other Ambulatory Visit: Payer: Self-pay | Admitting: Nurse Practitioner

## 2023-11-19 DIAGNOSIS — C259 Malignant neoplasm of pancreas, unspecified: Secondary | ICD-10-CM

## 2023-11-21 ENCOUNTER — Inpatient Hospital Stay

## 2023-11-21 ENCOUNTER — Inpatient Hospital Stay (HOSPITAL_BASED_OUTPATIENT_CLINIC_OR_DEPARTMENT_OTHER): Admitting: Oncology

## 2023-11-21 ENCOUNTER — Encounter: Payer: Self-pay | Admitting: Oncology

## 2023-11-21 ENCOUNTER — Inpatient Hospital Stay: Attending: Nurse Practitioner

## 2023-11-21 ENCOUNTER — Other Ambulatory Visit: Payer: Self-pay | Admitting: *Deleted

## 2023-11-21 VITALS — BP 165/90 | HR 63 | Temp 98.2°F | Resp 18

## 2023-11-21 VITALS — BP 151/95 | HR 68 | Temp 98.1°F | Resp 18 | Ht 63.0 in | Wt 233.0 lb

## 2023-11-21 DIAGNOSIS — Z5189 Encounter for other specified aftercare: Secondary | ICD-10-CM | POA: Insufficient documentation

## 2023-11-21 DIAGNOSIS — Z5111 Encounter for antineoplastic chemotherapy: Secondary | ICD-10-CM | POA: Diagnosis present

## 2023-11-21 DIAGNOSIS — C259 Malignant neoplasm of pancreas, unspecified: Secondary | ICD-10-CM

## 2023-11-21 DIAGNOSIS — Z79631 Long term (current) use of antimetabolite agent: Secondary | ICD-10-CM | POA: Insufficient documentation

## 2023-11-21 DIAGNOSIS — Z79634 Long term (current) use of topoisomerase inhibitor: Secondary | ICD-10-CM | POA: Insufficient documentation

## 2023-11-21 DIAGNOSIS — C252 Malignant neoplasm of tail of pancreas: Secondary | ICD-10-CM | POA: Insufficient documentation

## 2023-11-21 DIAGNOSIS — C787 Secondary malignant neoplasm of liver and intrahepatic bile duct: Secondary | ICD-10-CM | POA: Diagnosis present

## 2023-11-21 LAB — CBC WITH DIFFERENTIAL (CANCER CENTER ONLY)
Abs Immature Granulocytes: 0.12 K/uL — ABNORMAL HIGH (ref 0.00–0.07)
Basophils Absolute: 0 K/uL (ref 0.0–0.1)
Basophils Relative: 1 %
Eosinophils Absolute: 0.2 K/uL (ref 0.0–0.5)
Eosinophils Relative: 2 %
HCT: 32.2 % — ABNORMAL LOW (ref 36.0–46.0)
Hemoglobin: 10.9 g/dL — ABNORMAL LOW (ref 12.0–15.0)
Immature Granulocytes: 2 %
Lymphocytes Relative: 23 %
Lymphs Abs: 1.8 K/uL (ref 0.7–4.0)
MCH: 33.9 pg (ref 26.0–34.0)
MCHC: 33.9 g/dL (ref 30.0–36.0)
MCV: 100 fL (ref 80.0–100.0)
Monocytes Absolute: 0.6 K/uL (ref 0.1–1.0)
Monocytes Relative: 7 %
Neutro Abs: 5.1 K/uL (ref 1.7–7.7)
Neutrophils Relative %: 65 %
Platelet Count: 119 K/uL — ABNORMAL LOW (ref 150–400)
RBC: 3.22 MIL/uL — ABNORMAL LOW (ref 3.87–5.11)
RDW: 15.6 % — ABNORMAL HIGH (ref 11.5–15.5)
WBC Count: 7.7 K/uL (ref 4.0–10.5)
nRBC: 0 % (ref 0.0–0.2)

## 2023-11-21 LAB — MAGNESIUM: Magnesium: 1.9 mg/dL (ref 1.7–2.4)

## 2023-11-21 LAB — CMP (CANCER CENTER ONLY)
ALT: 112 U/L — ABNORMAL HIGH (ref 0–44)
AST: 113 U/L — ABNORMAL HIGH (ref 15–41)
Albumin: 4.4 g/dL (ref 3.5–5.0)
Alkaline Phosphatase: 211 U/L — ABNORMAL HIGH (ref 38–126)
Anion gap: 14 (ref 5–15)
BUN: 10 mg/dL (ref 6–20)
CO2: 24 mmol/L (ref 22–32)
Calcium: 10.3 mg/dL (ref 8.9–10.3)
Chloride: 102 mmol/L (ref 98–111)
Creatinine: 0.67 mg/dL (ref 0.44–1.00)
GFR, Estimated: >60 mL/min (ref >60)
Glucose, Bld: 117 mg/dL — ABNORMAL HIGH (ref 70–99)
Potassium: 4 mmol/L (ref 3.5–5.1)
Sodium: 140 mmol/L (ref 135–145)
Total Bilirubin: 0.4 mg/dL (ref 0.0–1.2)
Total Protein: 7.3 g/dL (ref 6.5–8.1)

## 2023-11-21 LAB — HCG, SERUM, QUALITATIVE: Preg, Serum: NEGATIVE

## 2023-11-21 MED ORDER — SODIUM CHLORIDE 0.9 % IV SOLN
120.0000 mg/m2 | Freq: Once | INTRAVENOUS | Status: AC
  Administered 2023-11-21: 260 mg via INTRAVENOUS
  Filled 2023-11-21: qty 5

## 2023-11-21 MED ORDER — PALONOSETRON HCL INJECTION 0.25 MG/5ML
0.2500 mg | Freq: Once | INTRAVENOUS | Status: AC
  Administered 2023-11-21: 0.25 mg via INTRAVENOUS
  Filled 2023-11-21: qty 5

## 2023-11-21 MED ORDER — DEXAMETHASONE SODIUM PHOSPHATE 10 MG/ML IJ SOLN
10.0000 mg | Freq: Once | INTRAMUSCULAR | Status: AC
  Administered 2023-11-21: 10 mg via INTRAVENOUS
  Filled 2023-11-21: qty 1

## 2023-11-21 MED ORDER — SODIUM CHLORIDE 0.9 % IV SOLN
400.0000 mg/m2 | Freq: Once | INTRAVENOUS | Status: AC
  Administered 2023-11-21: 856 mg via INTRAVENOUS
  Filled 2023-11-21: qty 42.8

## 2023-11-21 MED ORDER — SODIUM CHLORIDE 0.9 % IV SOLN
2400.0000 mg/m2 | INTRAVENOUS | Status: DC
  Administered 2023-11-21: 5000 mg via INTRAVENOUS
  Filled 2023-11-21: qty 100

## 2023-11-21 MED ORDER — SODIUM CHLORIDE 0.9 % IV SOLN: INTRAVENOUS | Status: DC

## 2023-11-21 MED ORDER — ATROPINE SULFATE 1 MG/ML IV SOLN
0.5000 mg | Freq: Once | INTRAVENOUS | Status: AC | PRN
  Administered 2023-11-21: 0.5 mg via INTRAVENOUS
  Filled 2023-11-21: qty 1

## 2023-11-21 MED ORDER — SODIUM CHLORIDE 0.9 % IV SOLN
150.0000 mg | Freq: Once | INTRAVENOUS | Status: AC
  Administered 2023-11-21: 150 mg via INTRAVENOUS
  Filled 2023-11-21: qty 5

## 2023-11-21 NOTE — Progress Notes (Signed)
 Patient seen by Dr. Arley Hof today  Vitals are within treatment parameters:Yes OK to proceed w/BP 151/95  Labs are within treatment parameters: No (Please specify and give further instructions.)  OK to proceed w/elevated AST and ALT. MD has added Mg+. May proceed without waiting for this result  Treatment plan has been signed: Yes   Per physician team, Patient is ready for treatment and there are NO modifications to the treatment plan.

## 2023-11-21 NOTE — Patient Instructions (Signed)
 [facility] - A DEPT OF [facility]  Discharge Instructions: Thank you for choosing [facility] to provide your oncology and hematology care.   If you have a lab appointment with the Cancer Center, please go directly to the Cancer Center and check in at the registration area.   Wear comfortable clothing and clothing appropriate for easy access to any Portacath or PICC line.   We strive to give you quality time with your provider. You may need to reschedule your appointment if you arrive late (15 or more minutes).  Arriving late affects you and other patients whose appointments are after yours.  Also, if you miss three or more appointments without notifying the office, you may be dismissed from the clinic at the provider's discretion.      For prescription refill requests, have your pharmacy contact our office and allow 72 hours for refills to be completed.    Today you received the following chemotherapy and/or immunotherapy agents Irinotecan , Leucovorin  and Adrucil       To help prevent nausea and vomiting after your treatment, we encourage you to take your nausea medication as directed.  BELOW ARE SYMPTOMS THAT SHOULD BE REPORTED IMMEDIATELY: *FEVER GREATER THAN 100.4 F (38 C) OR HIGHER *CHILLS OR SWEATING *NAUSEA AND VOMITING THAT IS NOT CONTROLLED WITH YOUR NAUSEA MEDICATION *UNUSUAL SHORTNESS OF BREATH *UNUSUAL BRUISING OR BLEEDING *URINARY PROBLEMS (pain or burning when urinating, or frequent urination) *BOWEL PROBLEMS (unusual diarrhea, constipation, pain near the anus) TENDERNESS IN MOUTH AND THROAT WITH OR WITHOUT PRESENCE OF ULCERS (sore throat, sores in mouth, or a toothache) UNUSUAL RASH, SWELLING OR PAIN  UNUSUAL VAGINAL DISCHARGE OR ITCHING   Items with * indicate a potential emergency and should be followed up as soon as possible or go to the Emergency Department if any problems should occur.  Please show the CHEMOTHERAPY  ALERT CARD or IMMUNOTHERAPY ALERT CARD at check-in to the Emergency Department and triage nurse.  Should you have questions after your visit or need to cancel or reschedule your appointment, please contact [facility] - A DEPT OF [facility]  Dept: [phone]  and follow the prompts.  Office hours are 8:00 a.m. to 4:30 p.m. Monday - Friday. Please note that voicemails left after 4:00 p.m. may not be returned until the following business day.  We are closed weekends and major holidays. You have access to a nurse at all times for urgent questions. Please call the main number to the clinic Dept: [phone] and follow the prompts.   For any non-urgent questions, you may also contact your provider using MyChart. We now offer e-Visits for anyone 46 and older to request care online for non-urgent symptoms. For details visit [email or web address].   Also download the MyChart app! Go to the app store, search MyChart, open the app, select [place], and log in with your MyChart username and password.

## 2023-11-21 NOTE — Progress Notes (Signed)
 [facility] OFFICE PROGRESS NOTE   Diagnosis: Pancreas cancer  INTERVAL HISTORY:   Ms. Moger completed another cycle of chemotherapy beginning 11/06/2023.  She reports nausea following chemotherapy, relieved with Compazine  and Zofran .  She had diarrhea beginning on day 3 and lasting until 11/18/2023.  She had diarrhea up to 4-5 times per day.  She had constipation after taking Imodium.  She continues to have numbness in the left hand and both feet.  The numbness is partially improved.  She has intermittent back pain.  Objective:  Vital signs in last 24 hours:  Blood pressure (!) 151/95, pulse 68, temperature 98.1 F (36.7 C), temperature source Temporal, resp. rate 18, height 5' 3 (1.6 m), weight 233 lb (105.7 kg), SpO2 98%.    HEENT: No thrush, healing 2-3 mm ulcer at the anterior left buccal mucosa Resp: Lungs clear bilaterally Cardio: Regular rate and rhythm GI: No hepatosplenomegaly, nontender Vascular: No leg edema  Skin: Palms without erythema  Portacath/PICC-without erythema  Lab Results:  Lab Results  Component Value Date   WBC 7.7 11/21/2023   HGB 10.9 (L) 11/21/2023   HCT 32.2 (L) 11/21/2023   MCV 100.0 11/21/2023   PLT PENDING 11/21/2023   NEUTROABS 5.1 11/21/2023    CMP  Lab Results  Component Value Date   NA 140 11/06/2023   K 4.0 11/06/2023   CL 102 11/06/2023   CO2 23 11/06/2023   GLUCOSE 124 (H) 11/06/2023   BUN 12 11/06/2023   CREATININE 0.70 11/06/2023   CALCIUM  10.4 (H) 11/06/2023   PROT 7.3 11/06/2023   ALBUMIN 4.4 11/06/2023   AST 99 (H) 11/06/2023   ALT 106 (H) 11/06/2023   ALKPHOS 215 (H) 11/06/2023   BILITOT 0.4 11/06/2023   GFRNONAA >60 11/06/2023   GFRAA >60 10/03/2015    Lab Results  Component Value Date   RJW800 14,824 (H) 11/06/2023   Medications: I have reviewed the patient's current medications.   Assessment/Plan: Pancreas cancer CT abdomen/pelvis 03/24/2023: 3 x 4.6 cm pancreas tail mass, multiple liver  lesions consistent with metastases, fat stranding surrounding the second and third ports of the duodenum and pancreas head/uncinate  CT chest 03/27/2023: New spiculated nodular density in the right lower lobe, 8 x 4 mm EUS 03/27/2023: Pancreas tail mass, liver lesions, T2 N0 M1, needle biopsy of pancreas mass, adenocarcinoma Foundation 1: K-ras G 12R, HRD signature negative, MSS, tumor mutation burden 0, PD-L1 TPS 1% Elevated CA 19-9 (121,720 on 03/26/2023) Cycle 1 FOLFIRINOX 04/10/2023 Chemotherapy held 04/24/2023 due to neutropenia Cycle 2 FOLFIRINOX 05/04/2023, Udenyca  Cycle 3 FOLFIRINOX 05/22/2023, Udenyca  Cycle 4 FOLFIRINOX 06/05/2023, Udenyca  Cycle 5 FOLFIRINOX 06/19/2023, Udenyca  CTs 06/26/2023: Stable pancreas mass, slight increase in size and number of hepatic metastases, improved ill-defined right lower lobe nodule, new nonspecific small groundglass nodules, improved stranding at the pancreas head/neck (review of CTs with patient-pancreas mass appears slightly smaller, several liver lesions are slightly larger others measured stable or slightly smaller Cycle 6 FOLFIRINOX 07/04/2023, Udenyca  Cycle 7 FOLFIRINOX 07/17/2023, Udenyca ; oxaliplatin  held due to neuropathy and thrombocytopenia Cycle 8 FOLFIRINOX 07/31/2023, Udenyca ; oxaliplatin  held due to neuropathy, irinotecan  dose reduced due to elevated AST/ALT. CTs 08/09/2023-improvement in hepatic metastases.  Decrease in size of pancreatic tail mass.  No evidence of metastatic disease within the chest. Cycle 9 FOLFIRINOX 08/16/2023, Udenyca ; oxaliplatin  held due to neuropathy Cycle 10 FOLFIRINOX 08/29/2023, Udenyca , oxaliplatin  held due to neuropathy Cycle 11 FOLFIRINOX 09/11/2023, Udenyca , oxaliplatin  held due to neuropathy Cycle 12 FOLFIRINOX 09/25/2023, Udenyca , oxaliplatin  held  due to neuropathy Cycle 13 FOLFIRINOX 10/11/2023, Udenyca , oxaliplatin  held due to neuropathy  CTs 10/19/2023: Decrease in the pancreas tail mass, stable to slightly smaller hepatic  lesions, no new lesions, 4 mm right lower lobe nodule present on 2 previous CTs Cycle 14 FOLFIRINOX 10/24/2023, Udenyca , oxaliplatin  held due to neuropathy Cycle 15 FOLFIRINOX 11/06/2023, Udenyca , oxaliplatin  held due to neuropathy Cycle 16 FOLFIRINOX 11/21/2023, Udenyca , oxaliplatin  held due to neuropathy   2.  Diabetes 3.  Acute abdominal pain and nausea/vomiting 03/23/2023 secondary to pancreatitis.  Improved 03/31/2023 4.  Hypertension. 5.  Asthma 6.  Hypothyroidism 7.  Hyperlipidemia 8.  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  She will complete another cycle of chemotherapy today.  Oxaliplatin  remains on hold.  She will try half of an Imodium tablet if she has diarrhea following this cycle of chemotherapy.  She will return for an office visit and chemotherapy in 2 weeks.  The intermittent back pain is most likely related to a benign musculoskeletal condition.    Arley Hof, MD  11/21/2023  8:07 AM

## 2023-11-21 NOTE — Addendum Note (Signed)
 Addended by: STEVA DEVERE CROME on: 11/21/2023 08:33 AM   Modules accepted: Orders

## 2023-11-22 LAB — CANCER ANTIGEN 19-9: CA 19-9: 13876 U/mL — ABNORMAL HIGH (ref 0–35)

## 2023-11-23 ENCOUNTER — Inpatient Hospital Stay

## 2023-11-23 VITALS — BP 138/88 | HR 69 | Temp 98.2°F | Resp 18

## 2023-11-23 DIAGNOSIS — C259 Malignant neoplasm of pancreas, unspecified: Secondary | ICD-10-CM

## 2023-11-23 DIAGNOSIS — Z5111 Encounter for antineoplastic chemotherapy: Secondary | ICD-10-CM | POA: Diagnosis not present

## 2023-11-23 MED ORDER — PEGFILGRASTIM-CBQV 6 MG/0.6ML ~~LOC~~ SOSY
6.0000 mg | PREFILLED_SYRINGE | Freq: Once | SUBCUTANEOUS | Status: AC
  Administered 2023-11-23: 6 mg via SUBCUTANEOUS
  Filled 2023-11-23: qty 0.6

## 2023-11-23 NOTE — Patient Instructions (Signed)

## 2023-11-25 ENCOUNTER — Other Ambulatory Visit: Payer: Self-pay

## 2023-11-29 ENCOUNTER — Other Ambulatory Visit: Payer: Self-pay

## 2023-11-30 ENCOUNTER — Encounter: Payer: Self-pay | Admitting: Nurse Practitioner

## 2023-11-30 NOTE — Telephone Encounter (Signed)
MD made aware of request

## 2023-12-01 ENCOUNTER — Other Ambulatory Visit: Payer: Self-pay | Admitting: *Deleted

## 2023-12-01 DIAGNOSIS — C259 Malignant neoplasm of pancreas, unspecified: Secondary | ICD-10-CM

## 2023-12-04 ENCOUNTER — Encounter: Payer: Self-pay | Admitting: Nurse Practitioner

## 2023-12-04 ENCOUNTER — Inpatient Hospital Stay

## 2023-12-04 ENCOUNTER — Inpatient Hospital Stay (HOSPITAL_BASED_OUTPATIENT_CLINIC_OR_DEPARTMENT_OTHER): Admitting: Nurse Practitioner

## 2023-12-04 VITALS — BP 146/87 | HR 72 | Temp 98.2°F | Resp 18 | Ht 63.0 in | Wt 231.1 lb

## 2023-12-04 VITALS — BP 138/86 | HR 70 | Temp 98.0°F | Resp 18

## 2023-12-04 DIAGNOSIS — Z5111 Encounter for antineoplastic chemotherapy: Secondary | ICD-10-CM | POA: Diagnosis not present

## 2023-12-04 DIAGNOSIS — C259 Malignant neoplasm of pancreas, unspecified: Secondary | ICD-10-CM

## 2023-12-04 LAB — CBC WITH DIFFERENTIAL (CANCER CENTER ONLY)
Abs Immature Granulocytes: 0.11 K/uL — ABNORMAL HIGH (ref 0.00–0.07)
Basophils Absolute: 0 K/uL (ref 0.0–0.1)
Basophils Relative: 0 %
Eosinophils Absolute: 0.2 K/uL (ref 0.0–0.5)
Eosinophils Relative: 3 %
HCT: 31.6 % — ABNORMAL LOW (ref 36.0–46.0)
Hemoglobin: 10.6 g/dL — ABNORMAL LOW (ref 12.0–15.0)
Immature Granulocytes: 2 %
Lymphocytes Relative: 22 %
Lymphs Abs: 1.4 K/uL (ref 0.7–4.0)
MCH: 33.7 pg (ref 26.0–34.0)
MCHC: 33.5 g/dL (ref 30.0–36.0)
MCV: 100.3 fL — ABNORMAL HIGH (ref 80.0–100.0)
Monocytes Absolute: 0.5 K/uL (ref 0.1–1.0)
Monocytes Relative: 8 %
Neutro Abs: 4.3 K/uL (ref 1.7–7.7)
Neutrophils Relative %: 65 %
Platelet Count: 169 K/uL (ref 150–400)
RBC: 3.15 MIL/uL — ABNORMAL LOW (ref 3.87–5.11)
RDW: 15.5 % (ref 11.5–15.5)
WBC Count: 6.6 K/uL (ref 4.0–10.5)
nRBC: 0 % (ref 0.0–0.2)

## 2023-12-04 LAB — CMP (CANCER CENTER ONLY)
ALT: 78 U/L — ABNORMAL HIGH (ref 0–44)
AST: 92 U/L — ABNORMAL HIGH (ref 15–41)
Albumin: 4.4 g/dL (ref 3.5–5.0)
Alkaline Phosphatase: 214 U/L — ABNORMAL HIGH (ref 38–126)
Anion gap: 14 (ref 5–15)
BUN: 10 mg/dL (ref 6–20)
CO2: 25 mmol/L (ref 22–32)
Calcium: 10 mg/dL (ref 8.9–10.3)
Chloride: 102 mmol/L (ref 98–111)
Creatinine: 0.67 mg/dL (ref 0.44–1.00)
GFR, Estimated: >60 mL/min (ref >60)
Glucose, Bld: 125 mg/dL — ABNORMAL HIGH (ref 70–99)
Potassium: 4 mmol/L (ref 3.5–5.1)
Sodium: 141 mmol/L (ref 135–145)
Total Bilirubin: 0.3 mg/dL (ref 0.0–1.2)
Total Protein: 7 g/dL (ref 6.5–8.1)

## 2023-12-04 LAB — HCG, SERUM, QUALITATIVE: Preg, Serum: NEGATIVE

## 2023-12-04 MED ORDER — SODIUM CHLORIDE 0.9 % IV SOLN
2400.0000 mg/m2 | INTRAVENOUS | Status: DC
  Administered 2023-12-04: 5000 mg via INTRAVENOUS
  Filled 2023-12-04: qty 100

## 2023-12-04 MED ORDER — SODIUM CHLORIDE 0.9 % IV SOLN
120.0000 mg/m2 | Freq: Once | INTRAVENOUS | Status: AC
  Administered 2023-12-04: 260 mg via INTRAVENOUS
  Filled 2023-12-04: qty 5

## 2023-12-04 MED ORDER — PANCRELIPASE (LIP-PROT-AMYL) 36000-114000 UNITS PO CPEP: 72000.0000 [IU] | ORAL_CAPSULE | Freq: Three times a day (TID) | ORAL | 2 refills | Status: AC

## 2023-12-04 MED ORDER — PANTOPRAZOLE SODIUM 40 MG PO TBEC: 40.0000 mg | DELAYED_RELEASE_TABLET | Freq: Two times a day (BID) | ORAL | 1 refills | Status: DC

## 2023-12-04 MED ORDER — ATROPINE SULFATE 1 MG/ML IV SOLN
0.5000 mg | Freq: Once | INTRAVENOUS | Status: AC | PRN
  Administered 2023-12-04: 0.5 mg via INTRAVENOUS
  Filled 2023-12-04: qty 1

## 2023-12-04 MED ORDER — SODIUM CHLORIDE 0.9 % IV SOLN
400.0000 mg/m2 | Freq: Once | INTRAVENOUS | Status: AC
  Administered 2023-12-04: 856 mg via INTRAVENOUS
  Filled 2023-12-04: qty 42.8

## 2023-12-04 MED ORDER — SODIUM CHLORIDE 0.9 % IV SOLN
150.0000 mg | Freq: Once | INTRAVENOUS | Status: AC
  Administered 2023-12-04: 150 mg via INTRAVENOUS
  Filled 2023-12-04: qty 150

## 2023-12-04 MED ORDER — PALONOSETRON HCL INJECTION 0.25 MG/5ML
0.2500 mg | Freq: Once | INTRAVENOUS | Status: AC
  Administered 2023-12-04: 0.25 mg via INTRAVENOUS
  Filled 2023-12-04: qty 5

## 2023-12-04 MED ORDER — DEXAMETHASONE SODIUM PHOSPHATE 10 MG/ML IJ SOLN
10.0000 mg | Freq: Once | INTRAMUSCULAR | Status: AC
  Administered 2023-12-04: 10 mg via INTRAVENOUS
  Filled 2023-12-04: qty 1

## 2023-12-04 MED ORDER — SODIUM CHLORIDE 0.9 % IV SOLN: INTRAVENOUS | Status: DC

## 2023-12-04 NOTE — Patient Instructions (Signed)

## 2023-12-04 NOTE — Progress Notes (Signed)
 Patient seen by Olam Ned NP today  Vitals are within treatment parameters:Yes   Labs are within treatment parameters: No (Please specify and give further instructions.) AST 92 Alkaline Phosphatase 214 Its okay to proceed.  Treatment plan has been signed: Yes   Per physician team, Patient is ready for treatment and there are NO modifications to the treatment plan.

## 2023-12-04 NOTE — Patient Instructions (Signed)
 [facility] - A DEPT OF [facility]  Discharge Instructions: Thank you for choosing [facility] to provide your oncology and hematology care.   If you have a lab appointment with the Cancer Center, please go directly to the Cancer Center and check in at the registration area.   Wear comfortable clothing and clothing appropriate for easy access to any Portacath or PICC line.   We strive to give you quality time with your provider. You may need to reschedule your appointment if you arrive late (15 or more minutes).  Arriving late affects you and other patients whose appointments are after yours.  Also, if you miss three or more appointments without notifying the office, you may be dismissed from the clinic at the provider's discretion.      For prescription refill requests, have your pharmacy contact our office and allow 72 hours for refills to be completed.    Today you received the following chemotherapy and/or immunotherapy agents: leucovorin , irinotecan , fluorouracil        To help prevent nausea and vomiting after your treatment, we encourage you to take your nausea medication as directed.  BELOW ARE SYMPTOMS THAT SHOULD BE REPORTED IMMEDIATELY: *FEVER GREATER THAN 100.4 F (38 C) OR HIGHER *CHILLS OR SWEATING *NAUSEA AND VOMITING THAT IS NOT CONTROLLED WITH YOUR NAUSEA MEDICATION *UNUSUAL SHORTNESS OF BREATH *UNUSUAL BRUISING OR BLEEDING *URINARY PROBLEMS (pain or burning when urinating, or frequent urination) *BOWEL PROBLEMS (unusual diarrhea, constipation, pain near the anus) TENDERNESS IN MOUTH AND THROAT WITH OR WITHOUT PRESENCE OF ULCERS (sore throat, sores in mouth, or a toothache) UNUSUAL RASH, SWELLING OR PAIN  UNUSUAL VAGINAL DISCHARGE OR ITCHING   Items with * indicate a potential emergency and should be followed up as soon as possible or go to the Emergency Department if any problems should occur.  Please show the CHEMOTHERAPY  ALERT CARD or IMMUNOTHERAPY ALERT CARD at check-in to the Emergency Department and triage nurse.  Should you have questions after your visit or need to cancel or reschedule your appointment, please contact [facility] - A DEPT OF [facility]  Dept: [phone]  and follow the prompts.  Office hours are 8:00 a.m. to 4:30 p.m. Monday - Friday. Please note that voicemails left after 4:00 p.m. may not be returned until the following business day.  We are closed weekends and major holidays. You have access to a nurse at all times for urgent questions. Please call the main number to the clinic Dept: [phone] and follow the prompts.   For any non-urgent questions, you may also contact your provider using MyChart. We now offer e-Visits for anyone 29 and older to request care online for non-urgent symptoms. For details visit [email or web address].   Also download the MyChart app! Go to the app store, search MyChart, open the app, select [place], and log in with your MyChart username and password.

## 2023-12-04 NOTE — Progress Notes (Signed)
 [name] OFFICE PROGRESS NOTE   Diagnosis: Pancreas cancer  INTERVAL HISTORY:   Debbie [name] returns as scheduled.  She completed another cycle of chemotherapy 11/21/2023.  She had mild nausea for a few days.  No vomiting.  No mouth sores.  She has loose stools after eating.  This is occurring on a consistent basis.  Stable mild tingling in the fingertips on the left hand.  Feet intermittently feel cold.  She noted discomfort on her feet about a week ago after excessive walking.  No blisters.  Objective:  Vital signs in last 24 hours:  Blood pressure (!) 146/87, pulse 72, temperature 98.2 F (36.8 C), temperature source Temporal, resp. rate 18, height 5' 3 (1.6 m), weight 231 lb 1.6 oz (104.8 kg), SpO2 98%.    HEENT: No thrush or ulcers. Resp: Lungs clear bilaterally. Cardio: Regular rate and rhythm. GI: No hepatosplenomegaly.  Nontender. Vascular: No leg edema. Skin: Palms and soles without erythema.  No blisters. Port-A-Cath without erythema.  Lab Results:  Lab Results  Component Value Date   WBC 6.6 12/04/2023   HGB 10.6 (L) 12/04/2023   HCT 31.6 (L) 12/04/2023   MCV 100.3 (H) 12/04/2023   PLT 169 12/04/2023   NEUTROABS 4.3 12/04/2023    Imaging:  No results found.  Medications: I have reviewed the patient's current medications.  Assessment/Plan: Pancreas cancer CT abdomen/pelvis 03/24/2023: 3 x 4.6 cm pancreas tail mass, multiple liver lesions consistent with metastases, fat stranding surrounding the second and third ports of the duodenum and pancreas head/uncinate  CT chest 03/27/2023: New spiculated nodular density in the right lower lobe, 8 x 4 mm EUS 03/27/2023: Pancreas tail mass, liver lesions, T2 N0 M1, needle biopsy of pancreas mass, adenocarcinoma Foundation 1: K-ras G 12R, HRD signature negative, MSS, tumor mutation burden 0, PD-L1 TPS 1% Elevated CA 19-9 (121,720 on 03/26/2023) Cycle 1 FOLFIRINOX 04/10/2023 Chemotherapy held 04/24/2023 due to  neutropenia Cycle 2 FOLFIRINOX 05/04/2023, Udenyca  Cycle 3 FOLFIRINOX 05/22/2023, Udenyca  Cycle 4 FOLFIRINOX 06/05/2023, Udenyca  Cycle 5 FOLFIRINOX 06/19/2023, Udenyca  CTs 06/26/2023: Stable pancreas mass, slight increase in size and number of hepatic metastases, improved ill-defined right lower lobe nodule, new nonspecific small groundglass nodules, improved stranding at the pancreas head/neck (review of CTs with patient-pancreas mass appears slightly smaller, several liver lesions are slightly larger others measured stable or slightly smaller Cycle 6 FOLFIRINOX 07/04/2023, Udenyca  Cycle 7 FOLFIRINOX 07/17/2023, Udenyca ; oxaliplatin  held due to neuropathy and thrombocytopenia Cycle 8 FOLFIRINOX 07/31/2023, Udenyca ; oxaliplatin  held due to neuropathy, irinotecan  dose reduced due to elevated AST/ALT. CTs 08/09/2023-improvement in hepatic metastases.  Decrease in size of pancreatic tail mass.  No evidence of metastatic disease within the chest. Cycle 9 FOLFIRINOX 08/16/2023, Udenyca ; oxaliplatin  held due to neuropathy Cycle 10 FOLFIRINOX 08/29/2023, Udenyca , oxaliplatin  held due to neuropathy Cycle 11 FOLFIRINOX 09/11/2023, Udenyca , oxaliplatin  held due to neuropathy Cycle 12 FOLFIRINOX 09/25/2023, Udenyca , oxaliplatin  held due to neuropathy Cycle 13 FOLFIRINOX 10/11/2023, Udenyca , oxaliplatin  held due to neuropathy  CTs 10/19/2023: Decrease in the pancreas tail mass, stable to slightly smaller hepatic lesions, no new lesions, 4 mm right lower lobe nodule present on 2 previous CTs Cycle 14 FOLFIRINOX 10/24/2023, Udenyca , oxaliplatin  held due to neuropathy Cycle 15 FOLFIRINOX 11/06/2023, Udenyca , oxaliplatin  held due to neuropathy Cycle 16 FOLFIRINOX 11/21/2023, Udenyca , oxaliplatin  held due to neuropathy Cycle 17 FOLFIRINOX 12/04/2023, Udenyca , oxaliplatin  held due to neuropathy   2.  Diabetes 3.  Acute abdominal pain and nausea/vomiting 03/23/2023 secondary to pancreatitis.  Improved 03/31/2023 4.  Hypertension. 5.   Asthma 6.  Hypothyroidism 7.  Hyperlipidemia 8.  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  She has completed 16 cycles of chemotherapy.  Overall tolerating treatment well.  No clinical evidence of disease progression.  Plan to proceed with cycle 17 today as scheduled.  Oxaliplatin  remains on hold due to neuropathy.  CBC reviewed.  Counts are adequate for treatment.  She is having loose stools after eating on a consistent basis.  She will begin pancreatic enzyme replacement.  She will return for follow-up and treatment in 2 weeks.  We are available to see her sooner if needed.    Olam Ned ANP/GNP-BC   12/04/2023  8:44 AM

## 2023-12-05 ENCOUNTER — Other Ambulatory Visit: Payer: Self-pay

## 2023-12-05 LAB — CANCER ANTIGEN 19-9: CA 19-9: 16135 U/mL — ABNORMAL HIGH (ref 0–35)

## 2023-12-06 ENCOUNTER — Inpatient Hospital Stay

## 2023-12-06 VITALS — BP 123/79 | HR 67 | Temp 98.1°F | Resp 18

## 2023-12-06 DIAGNOSIS — C259 Malignant neoplasm of pancreas, unspecified: Secondary | ICD-10-CM

## 2023-12-06 DIAGNOSIS — Z5111 Encounter for antineoplastic chemotherapy: Secondary | ICD-10-CM | POA: Diagnosis not present

## 2023-12-06 MED ORDER — PEGFILGRASTIM-CBQV 6 MG/0.6ML ~~LOC~~ SOSY
6.0000 mg | PREFILLED_SYRINGE | Freq: Once | SUBCUTANEOUS | Status: AC
  Administered 2023-12-06: 6 mg via SUBCUTANEOUS
  Filled 2023-12-06: qty 0.6

## 2023-12-06 NOTE — Patient Instructions (Signed)

## 2023-12-11 ENCOUNTER — Encounter: Payer: Self-pay | Admitting: Internal Medicine

## 2023-12-17 ENCOUNTER — Other Ambulatory Visit: Payer: Self-pay | Admitting: Oncology

## 2023-12-18 ENCOUNTER — Inpatient Hospital Stay

## 2023-12-18 ENCOUNTER — Inpatient Hospital Stay (HOSPITAL_BASED_OUTPATIENT_CLINIC_OR_DEPARTMENT_OTHER): Admitting: Oncology

## 2023-12-18 VITALS — BP 143/84 | HR 78 | Temp 97.9°F | Resp 18 | Ht 63.0 in | Wt 230.2 lb

## 2023-12-18 DIAGNOSIS — Z5111 Encounter for antineoplastic chemotherapy: Secondary | ICD-10-CM | POA: Diagnosis not present

## 2023-12-18 DIAGNOSIS — C259 Malignant neoplasm of pancreas, unspecified: Secondary | ICD-10-CM

## 2023-12-18 LAB — CMP (CANCER CENTER ONLY)
ALT: 60 U/L — ABNORMAL HIGH (ref 0–44)
AST: 61 U/L — ABNORMAL HIGH (ref 15–41)
Albumin: 4.3 g/dL (ref 3.5–5.0)
Alkaline Phosphatase: 186 U/L — ABNORMAL HIGH (ref 38–126)
Anion gap: 14 (ref 5–15)
BUN: 9 mg/dL (ref 6–20)
CO2: 25 mmol/L (ref 22–32)
Calcium: 10.1 mg/dL (ref 8.9–10.3)
Chloride: 103 mmol/L (ref 98–111)
Creatinine: 0.69 mg/dL (ref 0.44–1.00)
GFR, Estimated: >60 mL/min (ref >60)
Glucose, Bld: 117 mg/dL — ABNORMAL HIGH (ref 70–99)
Potassium: 3.7 mmol/L (ref 3.5–5.1)
Sodium: 141 mmol/L (ref 135–145)
Total Bilirubin: 0.4 mg/dL (ref 0.0–1.2)
Total Protein: 7.2 g/dL (ref 6.5–8.1)

## 2023-12-18 LAB — CBC WITH DIFFERENTIAL (CANCER CENTER ONLY)
Abs Immature Granulocytes: 0.16 K/uL — ABNORMAL HIGH (ref 0.00–0.07)
Basophils Absolute: 0 K/uL (ref 0.0–0.1)
Basophils Relative: 1 %
Eosinophils Absolute: 0.2 K/uL (ref 0.0–0.5)
Eosinophils Relative: 3 %
HCT: 30.9 % — ABNORMAL LOW (ref 36.0–46.0)
Hemoglobin: 10.6 g/dL — ABNORMAL LOW (ref 12.0–15.0)
Immature Granulocytes: 2 %
Lymphocytes Relative: 20 %
Lymphs Abs: 1.4 K/uL (ref 0.7–4.0)
MCH: 34.1 pg — ABNORMAL HIGH (ref 26.0–34.0)
MCHC: 34.3 g/dL (ref 30.0–36.0)
MCV: 99.4 fL (ref 80.0–100.0)
Monocytes Absolute: 0.5 K/uL (ref 0.1–1.0)
Monocytes Relative: 7 %
Neutro Abs: 4.7 K/uL (ref 1.7–7.7)
Neutrophils Relative %: 67 %
Platelet Count: 138 K/uL — ABNORMAL LOW (ref 150–400)
RBC: 3.11 MIL/uL — ABNORMAL LOW (ref 3.87–5.11)
RDW: 15.2 % (ref 11.5–15.5)
WBC Count: 7.1 K/uL (ref 4.0–10.5)
nRBC: 0 % (ref 0.0–0.2)

## 2023-12-18 LAB — HCG, SERUM, QUALITATIVE: Preg, Serum: NEGATIVE

## 2023-12-18 MED ORDER — PALONOSETRON HCL INJECTION 0.25 MG/5ML
0.2500 mg | Freq: Once | INTRAVENOUS | Status: AC
  Administered 2023-12-18: 0.25 mg via INTRAVENOUS
  Filled 2023-12-18: qty 5

## 2023-12-18 MED ORDER — SODIUM CHLORIDE 0.9 % IV SOLN
400.0000 mg/m2 | Freq: Once | INTRAVENOUS | Status: AC
  Administered 2023-12-18: 856 mg via INTRAVENOUS
  Filled 2023-12-18: qty 42.8

## 2023-12-18 MED ORDER — SODIUM CHLORIDE 0.9 % IV SOLN
2400.0000 mg/m2 | INTRAVENOUS | Status: DC
  Administered 2023-12-18: 5000 mg via INTRAVENOUS
  Filled 2023-12-18: qty 100

## 2023-12-18 MED ORDER — ATROPINE SULFATE 1 MG/ML IV SOLN
0.5000 mg | Freq: Once | INTRAVENOUS | Status: AC | PRN
  Administered 2023-12-18: 0.5 mg via INTRAVENOUS
  Filled 2023-12-18: qty 1

## 2023-12-18 MED ORDER — SODIUM CHLORIDE 0.9 % IV SOLN
120.0000 mg/m2 | Freq: Once | INTRAVENOUS | Status: AC
  Administered 2023-12-18: 260 mg via INTRAVENOUS
  Filled 2023-12-18: qty 5

## 2023-12-18 MED ORDER — SODIUM CHLORIDE 0.9 % IV SOLN: INTRAVENOUS | Status: DC

## 2023-12-18 MED ORDER — SODIUM CHLORIDE 0.9 % IV SOLN
150.0000 mg | Freq: Once | INTRAVENOUS | Status: AC
  Administered 2023-12-18: 150 mg via INTRAVENOUS
  Filled 2023-12-18: qty 150

## 2023-12-18 MED ORDER — DEXAMETHASONE SODIUM PHOSPHATE 10 MG/ML IJ SOLN
10.0000 mg | Freq: Once | INTRAMUSCULAR | Status: AC
  Administered 2023-12-18: 10 mg via INTRAVENOUS
  Filled 2023-12-18: qty 1

## 2023-12-18 NOTE — Patient Instructions (Signed)
 [facility] - A DEPT OF [facility]  Discharge Instructions: Thank you for choosing [facility] to provide your oncology and hematology care.   If you have a lab appointment with the Cancer Center, please go directly to the Cancer Center and check in at the registration area.   Wear comfortable clothing and clothing appropriate for easy access to any Portacath or PICC line.   We strive to give you quality time with your provider. You may need to reschedule your appointment if you arrive late (15 or more minutes).  Arriving late affects you and other patients whose appointments are after yours.  Also, if you miss three or more appointments without notifying the office, you may be dismissed from the clinic at the provider's discretion.      For prescription refill requests, have your pharmacy contact our office and allow 72 hours for refills to be completed.    Today you received the following chemotherapy and/or immunotherapy agents: leucovorin , irinotecan , fluorouracil        To help prevent nausea and vomiting after your treatment, we encourage you to take your nausea medication as directed.  BELOW ARE SYMPTOMS THAT SHOULD BE REPORTED IMMEDIATELY: *FEVER GREATER THAN 100.4 F (38 C) OR HIGHER *CHILLS OR SWEATING *NAUSEA AND VOMITING THAT IS NOT CONTROLLED WITH YOUR NAUSEA MEDICATION *UNUSUAL SHORTNESS OF BREATH *UNUSUAL BRUISING OR BLEEDING *URINARY PROBLEMS (pain or burning when urinating, or frequent urination) *BOWEL PROBLEMS (unusual diarrhea, constipation, pain near the anus) TENDERNESS IN MOUTH AND THROAT WITH OR WITHOUT PRESENCE OF ULCERS (sore throat, sores in mouth, or a toothache) UNUSUAL RASH, SWELLING OR PAIN  UNUSUAL VAGINAL DISCHARGE OR ITCHING   Items with * indicate a potential emergency and should be followed up as soon as possible or go to the Emergency Department if any problems should occur.  Please show the CHEMOTHERAPY  ALERT CARD or IMMUNOTHERAPY ALERT CARD at check-in to the Emergency Department and triage nurse.  Should you have questions after your visit or need to cancel or reschedule your appointment, please contact [facility] - A DEPT OF [facility]  Dept: [phone]  and follow the prompts.  Office hours are 8:00 a.m. to 4:30 p.m. Monday - Friday. Please note that voicemails left after 4:00 p.m. may not be returned until the following business day.  We are closed weekends and major holidays. You have access to a nurse at all times for urgent questions. Please call the main number to the clinic Dept: [phone] and follow the prompts.   For any non-urgent questions, you may also contact your provider using MyChart. We now offer e-Visits for anyone 29 and older to request care online for non-urgent symptoms. For details visit [email or web address].   Also download the MyChart app! Go to the app store, search MyChart, open the app, select [place], and log in with your MyChart username and password.

## 2023-12-18 NOTE — Progress Notes (Signed)
 Patient seen by Dr. Arley Hof today  Vitals are within treatment parameters:Yes  No intervention needed for BP 143/84  Labs are within treatment parameters: Yes   Treatment plan has been signed: Yes   Per physician team, Patient is ready for treatment and there are NO modifications to the treatment plan.

## 2023-12-18 NOTE — Progress Notes (Signed)
 [facility] OFFICE PROGRESS NOTE   Diagnosis: Pancreas cancer  INTERVAL HISTORY:   Ms. Ferrante completed another cycle of chemotherapy 12/04/2023.  No nausea or diarrhea.  Neuropathy symptoms have improved.  She continues to have cold sensitivity in the feet and numbness in the left hand.  The hand numbness is significantly improved.  She developed an ulcer at the upper anterior palate.  She reports pain at the neck when flexing forward.  The pain is intermittent.  Objective:  Vital signs in last 24 hours:  Blood pressure (!) 143/84, pulse 78, temperature 97.9 F (36.6 C), temperature source Temporal, resp. rate 18, height 5' 3 (1.6 m), weight 230 lb 3.2 oz (104.4 kg), SpO2 98%.    HEENT: No thrush, healing ulcer at the mid anterior upper gumline/palate the neck without mass or tenderness Resp: Lungs clear bilaterally Cardio: Regular rate and rhythm GI: No hepatosplenomegaly, nontender Vascular: No leg edema Neuro: The motor exam appears intact in the arms bilaterally   Portacath/PICC-without erythema  Lab Results:  Lab Results  Component Value Date   WBC 7.1 12/18/2023   HGB 10.6 (L) 12/18/2023   HCT 30.9 (L) 12/18/2023   MCV 99.4 12/18/2023   PLT 138 (L) 12/18/2023   NEUTROABS 4.7 12/18/2023    CMP  Lab Results  Component Value Date   NA 141 12/04/2023   K 4.0 12/04/2023   CL 102 12/04/2023   CO2 25 12/04/2023   GLUCOSE 125 (H) 12/04/2023   BUN 10 12/04/2023   CREATININE 0.67 12/04/2023   CALCIUM  10.0 12/04/2023   PROT 7.0 12/04/2023   ALBUMIN 4.4 12/04/2023   AST 92 (H) 12/04/2023   ALT 78 (H) 12/04/2023   ALKPHOS 214 (H) 12/04/2023   BILITOT 0.3 12/04/2023   GFRNONAA >60 12/04/2023   GFRAA >60 10/03/2015    Lab Results  Component Value Date   RJW800 16,135 (H) 12/04/2023   Medications: I have reviewed the patient's current medications.   Assessment/Plan:  Pancreas cancer CT abdomen/pelvis 03/24/2023: 3 x 4.6 cm pancreas tail  mass, multiple liver lesions consistent with metastases, fat stranding surrounding the second and third ports of the duodenum and pancreas head/uncinate  CT chest 03/27/2023: New spiculated nodular density in the right lower lobe, 8 x 4 mm EUS 03/27/2023: Pancreas tail mass, liver lesions, T2 N0 M1, needle biopsy of pancreas mass, adenocarcinoma Foundation 1: K-ras G 12R, HRD signature negative, MSS, tumor mutation burden 0, PD-L1 TPS 1% Elevated CA 19-9 (121,720 on 03/26/2023) Cycle 1 FOLFIRINOX 04/10/2023 Chemotherapy held 04/24/2023 due to neutropenia Cycle 2 FOLFIRINOX 05/04/2023, Udenyca  Cycle 3 FOLFIRINOX 05/22/2023, Udenyca  Cycle 4 FOLFIRINOX 06/05/2023, Udenyca  Cycle 5 FOLFIRINOX 06/19/2023, Udenyca  CTs 06/26/2023: Stable pancreas mass, slight increase in size and number of hepatic metastases, improved ill-defined right lower lobe nodule, new nonspecific small groundglass nodules, improved stranding at the pancreas head/neck (review of CTs with patient-pancreas mass appears slightly smaller, several liver lesions are slightly larger others measured stable or slightly smaller Cycle 6 FOLFIRINOX 07/04/2023, Udenyca  Cycle 7 FOLFIRINOX 07/17/2023, Udenyca ; oxaliplatin  held due to neuropathy and thrombocytopenia Cycle 8 FOLFIRINOX 07/31/2023, Udenyca ; oxaliplatin  held due to neuropathy, irinotecan  dose reduced due to elevated AST/ALT. CTs 08/09/2023-improvement in hepatic metastases.  Decrease in size of pancreatic tail mass.  No evidence of metastatic disease within the chest. Cycle 9 FOLFIRINOX 08/16/2023, Udenyca ; oxaliplatin  held due to neuropathy Cycle 10 FOLFIRINOX 08/29/2023, Udenyca , oxaliplatin  held due to neuropathy Cycle 11 FOLFIRINOX 09/11/2023, Udenyca , oxaliplatin  held due to neuropathy Cycle 12  FOLFIRINOX 09/25/2023, Udenyca , oxaliplatin  held due to neuropathy Cycle 13 FOLFIRINOX 10/11/2023, Udenyca , oxaliplatin  held due to neuropathy  CTs 10/19/2023: Decrease in the pancreas tail mass, stable to  slightly smaller hepatic lesions, no new lesions, 4 mm right lower lobe nodule present on 2 previous CTs Cycle 14 FOLFIRINOX 10/24/2023, Udenyca , oxaliplatin  held due to neuropathy Cycle 15 FOLFIRINOX 11/06/2023, Udenyca , oxaliplatin  held due to neuropathy Cycle 16 FOLFIRINOX 11/21/2023, Udenyca , oxaliplatin  held due to neuropathy Cycle 17 FOLFIRINOX 12/04/2023, Udenyca , oxaliplatin  held due to neuropathy Cycle 18 FOLFIRINOX 12/18/2023, Udenyca , oxaliplatin  held due to neuropathy   2.  Diabetes 3.  Acute abdominal pain and nausea/vomiting 03/23/2023 secondary to pancreatitis.  Improved 03/31/2023 4.  Hypertension. 5.  Asthma 6.  Hypothyroidism 7.  Hyperlipidemia 8.  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 Bencosme appears stable.  She will complete another cycle of chemotherapy today.  Oxaliplatin  remains on hold secondary to neuropathy.  Neuropathy symptoms have improved.  We will consider adding oxaliplatin  back into the chemotherapy regimen depending on her symptoms and the restaging CT.  The neck discomfort with forward flexion may be related to oxaliplatin  neuropathy.  She will complete another cycle of FOLFIRI today.  Ms. Gowen will undergo restaging CTs after this cycle.  Arley Hof, MD  12/18/2023  8:54 AM

## 2023-12-19 ENCOUNTER — Encounter: Payer: Self-pay | Admitting: *Deleted

## 2023-12-19 LAB — CANCER ANTIGEN 19-9: CA 19-9: 9532 U/mL — ABNORMAL HIGH (ref 0–35)

## 2023-12-20 ENCOUNTER — Other Ambulatory Visit: Payer: Self-pay | Admitting: Internal Medicine

## 2023-12-20 ENCOUNTER — Inpatient Hospital Stay: Attending: Nurse Practitioner

## 2023-12-20 VITALS — BP 123/75 | HR 65 | Temp 98.3°F | Resp 18

## 2023-12-20 DIAGNOSIS — Z5189 Encounter for other specified aftercare: Secondary | ICD-10-CM | POA: Insufficient documentation

## 2023-12-20 DIAGNOSIS — C259 Malignant neoplasm of pancreas, unspecified: Secondary | ICD-10-CM

## 2023-12-20 DIAGNOSIS — C787 Secondary malignant neoplasm of liver and intrahepatic bile duct: Secondary | ICD-10-CM | POA: Diagnosis present

## 2023-12-20 DIAGNOSIS — Z5111 Encounter for antineoplastic chemotherapy: Secondary | ICD-10-CM | POA: Diagnosis present

## 2023-12-20 DIAGNOSIS — C252 Malignant neoplasm of tail of pancreas: Secondary | ICD-10-CM | POA: Diagnosis present

## 2023-12-20 DIAGNOSIS — F3342 Major depressive disorder, recurrent, in full remission: Secondary | ICD-10-CM

## 2023-12-20 MED ORDER — PEGFILGRASTIM-CBQV 6 MG/0.6ML ~~LOC~~ SOSY
6.0000 mg | PREFILLED_SYRINGE | Freq: Once | SUBCUTANEOUS | Status: AC
  Administered 2023-12-20: 6 mg via SUBCUTANEOUS
  Filled 2023-12-20: qty 0.6

## 2023-12-20 NOTE — Patient Instructions (Signed)

## 2023-12-29 ENCOUNTER — Inpatient Hospital Stay

## 2023-12-29 ENCOUNTER — Ambulatory Visit (HOSPITAL_BASED_OUTPATIENT_CLINIC_OR_DEPARTMENT_OTHER)
Admission: RE | Admit: 2023-12-29 | Discharge: 2023-12-29 | Disposition: A | Discharge status: Home / Self Care | Source: Ambulatory Visit | Attending: Oncology | Admitting: Oncology

## 2023-12-29 DIAGNOSIS — C259 Malignant neoplasm of pancreas, unspecified: Secondary | ICD-10-CM | POA: Insufficient documentation

## 2023-12-29 MED ORDER — IOHEXOL 300 MG/ML  SOLN
100.0000 mL | Freq: Once | INTRAMUSCULAR | Status: AC | PRN
  Administered 2023-12-29: 100 mL via INTRAVENOUS

## 2023-12-29 NOTE — Patient Instructions (Signed)

## 2024-01-01 ENCOUNTER — Ambulatory Visit: Admitting: Nurse Practitioner

## 2024-01-01 ENCOUNTER — Other Ambulatory Visit

## 2024-01-01 ENCOUNTER — Ambulatory Visit

## 2024-01-01 ENCOUNTER — Other Ambulatory Visit: Payer: Self-pay | Admitting: Oncology

## 2024-01-03 ENCOUNTER — Encounter

## 2024-01-08 ENCOUNTER — Other Ambulatory Visit

## 2024-01-08 ENCOUNTER — Inpatient Hospital Stay

## 2024-01-08 ENCOUNTER — Inpatient Hospital Stay: Admitting: Nurse Practitioner

## 2024-01-08 ENCOUNTER — Encounter: Payer: Self-pay | Admitting: Nurse Practitioner

## 2024-01-08 VITALS — BP 138/85 | HR 72 | Temp 98.1°F | Resp 18 | Ht 63.0 in | Wt 232.4 lb

## 2024-01-08 DIAGNOSIS — C259 Malignant neoplasm of pancreas, unspecified: Secondary | ICD-10-CM | POA: Diagnosis not present

## 2024-01-08 DIAGNOSIS — Z5111 Encounter for antineoplastic chemotherapy: Secondary | ICD-10-CM | POA: Diagnosis not present

## 2024-01-08 LAB — CBC WITH DIFFERENTIAL (CANCER CENTER ONLY)
Abs Immature Granulocytes: 0.03 K/uL (ref 0.00–0.07)
Basophils Absolute: 0 K/uL (ref 0.0–0.1)
Basophils Relative: 1 %
Eosinophils Absolute: 0.2 K/uL (ref 0.0–0.5)
Eosinophils Relative: 4 %
HCT: 33.3 % — ABNORMAL LOW (ref 36.0–46.0)
Hemoglobin: 11.4 g/dL — ABNORMAL LOW (ref 12.0–15.0)
Immature Granulocytes: 1 %
Lymphocytes Relative: 28 %
Lymphs Abs: 1.2 K/uL (ref 0.7–4.0)
MCH: 33.9 pg (ref 26.0–34.0)
MCHC: 34.2 g/dL (ref 30.0–36.0)
MCV: 99.1 fL (ref 80.0–100.0)
Monocytes Absolute: 0.3 K/uL (ref 0.1–1.0)
Monocytes Relative: 8 %
Neutro Abs: 2.6 K/uL (ref 1.7–7.7)
Neutrophils Relative %: 58 %
Platelet Count: 170 K/uL (ref 150–400)
RBC: 3.36 MIL/uL — ABNORMAL LOW (ref 3.87–5.11)
RDW: 14.9 % (ref 11.5–15.5)
WBC Count: 4.4 K/uL (ref 4.0–10.5)
nRBC: 0 % (ref 0.0–0.2)

## 2024-01-08 LAB — CMP (CANCER CENTER ONLY)
ALT: 72 U/L — ABNORMAL HIGH (ref 0–44)
AST: 66 U/L — ABNORMAL HIGH (ref 15–41)
Albumin: 4.4 g/dL (ref 3.5–5.0)
Alkaline Phosphatase: 185 U/L — ABNORMAL HIGH (ref 38–126)
Anion gap: 13 (ref 5–15)
BUN: 14 mg/dL (ref 6–20)
CO2: 26 mmol/L (ref 22–32)
Calcium: 10.3 mg/dL (ref 8.9–10.3)
Chloride: 101 mmol/L (ref 98–111)
Creatinine: 0.62 mg/dL (ref 0.44–1.00)
GFR, Estimated: >60 mL/min (ref >60)
Glucose, Bld: 126 mg/dL — ABNORMAL HIGH (ref 70–99)
Potassium: 3.9 mmol/L (ref 3.5–5.1)
Sodium: 139 mmol/L (ref 135–145)
Total Bilirubin: 0.5 mg/dL (ref 0.0–1.2)
Total Protein: 7.4 g/dL (ref 6.5–8.1)

## 2024-01-08 LAB — HCG, SERUM, QUALITATIVE: Preg, Serum: NEGATIVE

## 2024-01-08 MED ORDER — PALONOSETRON HCL INJECTION 0.25 MG/5ML
0.2500 mg | Freq: Once | INTRAVENOUS | Status: AC
  Administered 2024-01-08: 0.25 mg via INTRAVENOUS
  Filled 2024-01-08: qty 5

## 2024-01-08 MED ORDER — CYCLOBENZAPRINE HCL 5 MG PO TABS: 5.0000 mg | ORAL_TABLET | Freq: Three times a day (TID) | ORAL | 0 refills | Status: AC | PRN

## 2024-01-08 MED ORDER — SODIUM CHLORIDE 0.9 % IV SOLN
400.0000 mg/m2 | Freq: Once | INTRAVENOUS | Status: AC
  Administered 2024-01-08: 856 mg via INTRAVENOUS
  Filled 2024-01-08: qty 42.8

## 2024-01-08 MED ORDER — SODIUM CHLORIDE 0.9 % IV SOLN
2400.0000 mg/m2 | INTRAVENOUS | Status: DC
  Administered 2024-01-08: 5000 mg via INTRAVENOUS
  Filled 2024-01-08: qty 100

## 2024-01-08 MED ORDER — DEXAMETHASONE SOD PHOSPHATE PF 10 MG/ML IJ SOLN
10.0000 mg | Freq: Once | INTRAMUSCULAR | Status: AC
  Administered 2024-01-08: 10 mg via INTRAVENOUS

## 2024-01-08 MED ORDER — NYSTATIN 100000 UNIT/ML MT SUSP: 5.0000 mL | Freq: Four times a day (QID) | OROMUCOSAL | 0 refills | Status: AC

## 2024-01-08 MED ORDER — SODIUM CHLORIDE 0.9 % IV SOLN
120.0000 mg/m2 | Freq: Once | INTRAVENOUS | Status: AC
  Administered 2024-01-08: 260 mg via INTRAVENOUS
  Filled 2024-01-08: qty 5

## 2024-01-08 MED ORDER — SODIUM CHLORIDE 0.9 % IV SOLN: INTRAVENOUS | Status: DC

## 2024-01-08 MED ORDER — ATROPINE SULFATE 1 MG/ML IV SOLN
0.5000 mg | Freq: Once | INTRAVENOUS | Status: AC | PRN
  Administered 2024-01-08: 0.5 mg via INTRAVENOUS
  Filled 2024-01-08: qty 1

## 2024-01-08 MED ORDER — SODIUM CHLORIDE 0.9 % IV SOLN
150.0000 mg | Freq: Once | INTRAVENOUS | Status: AC
  Administered 2024-01-08: 150 mg via INTRAVENOUS
  Filled 2024-01-08: qty 150

## 2024-01-08 NOTE — Progress Notes (Signed)
 Debbie Debbie [name].  She completed cycle 18 FOLFIRINOX 12/18/2023.  Oxaliplatin  held due to neuropathy.  She had mild nausea intermittently for the first 3 to 4 days.  She typically only has to take 1 home nausea medication.  No mouth sores.  No diarrhea.  Neuropathy symptoms overall better.  She continues to note mild intermittent tingling in the fingertips and feet.  No abdominal pain.  She notes a knot at the right abdomen, occasionally painful.  She thinks the size has increased.  She continues to have neck and back spasms.  Objective:  Vital signs in last 24 hours:  Blood pressure 138/85, pulse 72, temperature 98.1 F (36.7 C), temperature source Temporal, resp. rate 18, height 5' 3 (1.6 m), weight 232 lb 6.4 oz (105.4 kg), SpO2 96%.    HEENT: White coating over tongue. Resp: Lungs clear bilaterally. Cardio: Regular rate and rhythm. GI: No hepatosplenomegaly. Vascular: No leg edema. Neuro: Alert and oriented. Skin: Palms without erythema.  Tiny cutaneous nodule right lateral abdomen. Port-A-Cath without erythema.  Lab Results:  Lab Results  Component Value Date   WBC 4.4 01/08/2024   HGB 11.4 (L) 01/08/2024   HCT 33.3 (L) 01/08/2024   MCV 99.1 01/08/2024   PLT 170 01/08/2024   NEUTROABS 2.6 01/08/2024    Imaging:  No results found.  Medications: I have reviewed the patient's current medications.  Assessment/Plan: Pancreas cancer CT abdomen/pelvis 03/24/2023: 3 x 4.6 cm pancreas tail mass, multiple liver lesions consistent with metastases, fat stranding surrounding the second and third ports of the duodenum and pancreas head/uncinate  CT chest 03/27/2023: New spiculated nodular density in the right lower lobe, 8 x 4 mm EUS 03/27/2023: Pancreas tail mass, liver lesions, T2 N0 M1, needle biopsy of pancreas mass, adenocarcinoma Foundation 1: K-ras G 12R, HRD  signature negative, MSS, tumor mutation burden 0, PD-L1 TPS 1% Elevated CA 19-9 (121,720 on 03/26/2023) Cycle 1 FOLFIRINOX 04/10/2023 Chemotherapy held 04/24/2023 due to neutropenia Cycle 2 FOLFIRINOX 05/04/2023, Udenyca  Cycle 3 FOLFIRINOX 05/22/2023, Udenyca  Cycle 4 FOLFIRINOX 06/05/2023, Udenyca  Cycle 5 FOLFIRINOX 06/19/2023, Udenyca  CTs 06/26/2023: Stable pancreas mass, slight increase in size and number of hepatic metastases, improved ill-defined right lower lobe nodule, new nonspecific small groundglass nodules, improved stranding at the pancreas head/neck (review of CTs with patient-pancreas mass appears slightly smaller, several liver lesions are slightly larger others measured stable or slightly smaller Cycle 6 FOLFIRINOX 07/04/2023, Udenyca  Cycle 7 FOLFIRINOX 07/17/2023, Udenyca ; oxaliplatin  held due to neuropathy and thrombocytopenia Cycle 8 FOLFIRINOX 07/31/2023, Udenyca ; oxaliplatin  held due to neuropathy, irinotecan  dose reduced due to elevated AST/ALT. CTs 08/09/2023-improvement in hepatic metastases.  Decrease in size of pancreatic tail mass.  No evidence of metastatic disease within the chest. Cycle 9 FOLFIRINOX 08/16/2023, Udenyca ; oxaliplatin  held due to neuropathy Cycle 10 FOLFIRINOX 08/29/2023, Udenyca , oxaliplatin  held due to neuropathy Cycle 11 FOLFIRINOX 09/11/2023, Udenyca , oxaliplatin  held due to neuropathy Cycle 12 FOLFIRINOX 09/25/2023, Udenyca , oxaliplatin  held due to neuropathy Cycle 13 FOLFIRINOX 10/11/2023, Udenyca , oxaliplatin  held due to neuropathy  CTs 10/19/2023: Decrease in the pancreas tail mass, stable to slightly smaller hepatic lesions, no new lesions, 4 mm right lower lobe nodule present on 2 previous CTs Cycle 14 FOLFIRINOX 10/24/2023, Udenyca , oxaliplatin  held due to neuropathy Cycle 15 FOLFIRINOX 11/06/2023, Udenyca , oxaliplatin  held due to neuropathy Cycle 16 FOLFIRINOX 11/21/2023, Udenyca , oxaliplatin  held due to neuropathy Cycle 17 FOLFIRINOX 12/04/2023, Udenyca , oxaliplatin   held due to neuropathy Cycle 18 FOLFIRINOX 12/18/2023, Udenyca , oxaliplatin  held due to neuropathy CTs 12/29/2023-stable to slight decrease size of pancreatic tail lesion.  Bilobar hepatic metastatic lesions overall similar to slightly decreased.  Stable 4 mm right lower lobe pulmonary nodule. Cycle 19 FOLFIRINOX 01/08/2024, Udenyca , oxaliplatin  held due to neuropathy   2.  Diabetes 3.  Acute abdominal pain and nausea/vomiting 03/23/2023 secondary to pancreatitis.  Improved 03/31/2023 4.  Hypertension. 5.  Asthma 6.  Hypothyroidism 7.  Hyperlipidemia 8.  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  She has completed 18 cycles of FOLFIRINOX.  Oxaliplatin  remains on hold due to neuropathy.  Recent restaging CTs show the pancreas tail lesion and liver metastases are stable to slightly smaller.  Results/images reviewed with her and her husband at today's visit.  She understands and agrees with the Dr. Andriette recommendation to continue the current treatment.  Plan to proceed with FOLFIRI today as Debbie [name] and again in 2 weeks, subsequent cycles on a 3-week schedule.  CBC and chemistry panel reviewed.  Labs adequate for treatment.  AST/ALT with stable mild elevation.  Continue to monitor.  She will try nystatin  mouth rinse for the white coating over the tongue.  For muscle spasms she will try Flexeril.  The tiny skin nodule on her right side may be a skin cyst.  Continue to monitor.  She will return for follow-up and treatment in 2 weeks.  We are available to see her sooner if needed.  Patient seen with Dr. Cloretta.    Olam Ned ANP/GNP-BC   01/08/2024  8:32 AM This was a shared visit with Olam Ned.  Debbie Debbie [name] was interviewed and examined.  We reviewed the restaging CT findings and images with Debbie Debbie [name].  The CTs are consistent with a continued  response to systemic therapy.  I recommend continuing FOLFIRI.  The subcutaneous nodular lesion at the right lateral abdomen is likely a benign cyst.  We will consider additional evaluation if this lesion enlarges.  The plan is to change to a 3-week chemotherapy schedule due to malaise following chemotherapy.  Arvella Cloretta, MD

## 2024-01-08 NOTE — Patient Instructions (Signed)
 [facility] - A DEPT OF [facility]  Discharge Instructions: Thank you for choosing [facility] to provide your oncology and hematology care.   If you have a lab appointment with the Cancer Center, please go directly to the Cancer Center and check in at the registration area.   Wear comfortable clothing and clothing appropriate for easy access to any Portacath or PICC line.   We strive to give you quality time with your provider. You may need to reschedule your appointment if you arrive late (15 or more minutes).  Arriving late affects you and other patients whose appointments are after yours.  Also, if you miss three or more appointments without notifying the office, you may be dismissed from the clinic at the provider's discretion.      For prescription refill requests, have your pharmacy contact our office and allow 72 hours for refills to be completed.    Today you received the following chemotherapy and/or immunotherapy agents: leucovorin , irinotecan , fluorouracil        To help prevent nausea and vomiting after your treatment, we encourage you to take your nausea medication as directed.  BELOW ARE SYMPTOMS THAT SHOULD BE REPORTED IMMEDIATELY: *FEVER GREATER THAN 100.4 F (38 C) OR HIGHER *CHILLS OR SWEATING *NAUSEA AND VOMITING THAT IS NOT CONTROLLED WITH YOUR NAUSEA MEDICATION *UNUSUAL SHORTNESS OF BREATH *UNUSUAL BRUISING OR BLEEDING *URINARY PROBLEMS (pain or burning when urinating, or frequent urination) *BOWEL PROBLEMS (unusual diarrhea, constipation, pain near the anus) TENDERNESS IN MOUTH AND THROAT WITH OR WITHOUT PRESENCE OF ULCERS (sore throat, sores in mouth, or a toothache) UNUSUAL RASH, SWELLING OR PAIN  UNUSUAL VAGINAL DISCHARGE OR ITCHING   Items with * indicate a potential emergency and should be followed up as soon as possible or go to the Emergency Department if any problems should occur.  Please show the CHEMOTHERAPY  ALERT CARD or IMMUNOTHERAPY ALERT CARD at check-in to the Emergency Department and triage nurse.  Should you have questions after your visit or need to cancel or reschedule your appointment, please contact [facility] - A DEPT OF [facility]  Dept: [phone]  and follow the prompts.  Office hours are 8:00 a.m. to 4:30 p.m. Monday - Friday. Please note that voicemails left after 4:00 p.m. may not be returned until the following business day.  We are closed weekends and major holidays. You have access to a nurse at all times for urgent questions. Please call the main number to the clinic Dept: [phone] and follow the prompts.   For any non-urgent questions, you may also contact your provider using MyChart. We now offer e-Visits for anyone 29 and older to request care online for non-urgent symptoms. For details visit [email or web address].   Also download the MyChart app! Go to the app store, search MyChart, open the app, select [place], and log in with your MyChart username and password.

## 2024-01-08 NOTE — Progress Notes (Signed)
 Patient seen by Olam Ned NP today  Vitals are within treatment parameters:Yes   Labs are within treatment parameters: Yes   Treatment plan has been signed: Yes   Per physician team, Patient is ready for treatment and there are NO modifications to the treatment plan.

## 2024-01-09 ENCOUNTER — Other Ambulatory Visit: Payer: Self-pay

## 2024-01-09 LAB — CANCER ANTIGEN 19-9: CA 19-9: 12054 U/mL — ABNORMAL HIGH (ref 0–35)

## 2024-01-10 ENCOUNTER — Inpatient Hospital Stay

## 2024-01-10 VITALS — BP 139/81 | HR 67 | Temp 98.1°F | Resp 18

## 2024-01-10 DIAGNOSIS — Z5111 Encounter for antineoplastic chemotherapy: Secondary | ICD-10-CM | POA: Diagnosis not present

## 2024-01-10 DIAGNOSIS — C259 Malignant neoplasm of pancreas, unspecified: Secondary | ICD-10-CM

## 2024-01-10 MED ORDER — PEGFILGRASTIM-CBQV 6 MG/0.6ML ~~LOC~~ SOSY
6.0000 mg | PREFILLED_SYRINGE | Freq: Once | SUBCUTANEOUS | Status: AC
  Administered 2024-01-10: 6 mg via SUBCUTANEOUS
  Filled 2024-01-10: qty 0.6

## 2024-01-10 NOTE — Patient Instructions (Signed)

## 2024-01-15 ENCOUNTER — Ambulatory Visit

## 2024-01-15 ENCOUNTER — Ambulatory Visit: Admitting: Oncology

## 2024-01-17 ENCOUNTER — Encounter

## 2024-01-20 ENCOUNTER — Other Ambulatory Visit: Payer: Self-pay | Admitting: Oncology

## 2024-01-20 DIAGNOSIS — C259 Malignant neoplasm of pancreas, unspecified: Secondary | ICD-10-CM

## 2024-01-22 ENCOUNTER — Inpatient Hospital Stay

## 2024-01-22 ENCOUNTER — Inpatient Hospital Stay: Admitting: Oncology

## 2024-01-22 ENCOUNTER — Inpatient Hospital Stay: Attending: Nurse Practitioner

## 2024-01-22 VITALS — BP 128/93 | HR 83 | Temp 97.9°F | Resp 18 | Ht 63.0 in | Wt 234.0 lb

## 2024-01-22 VITALS — BP 155/87 | HR 73 | Temp 98.2°F | Resp 18

## 2024-01-22 DIAGNOSIS — C787 Secondary malignant neoplasm of liver and intrahepatic bile duct: Secondary | ICD-10-CM | POA: Diagnosis present

## 2024-01-22 DIAGNOSIS — Z79634 Long term (current) use of topoisomerase inhibitor: Secondary | ICD-10-CM | POA: Insufficient documentation

## 2024-01-22 DIAGNOSIS — Z5189 Encounter for other specified aftercare: Secondary | ICD-10-CM | POA: Insufficient documentation

## 2024-01-22 DIAGNOSIS — C259 Malignant neoplasm of pancreas, unspecified: Secondary | ICD-10-CM

## 2024-01-22 DIAGNOSIS — Z5111 Encounter for antineoplastic chemotherapy: Secondary | ICD-10-CM | POA: Diagnosis present

## 2024-01-22 DIAGNOSIS — Z79631 Long term (current) use of antimetabolite agent: Secondary | ICD-10-CM | POA: Insufficient documentation

## 2024-01-22 DIAGNOSIS — C252 Malignant neoplasm of tail of pancreas: Secondary | ICD-10-CM | POA: Insufficient documentation

## 2024-01-22 LAB — CBC WITH DIFFERENTIAL (CANCER CENTER ONLY)
Abs Immature Granulocytes: 0.06 K/uL (ref 0.00–0.07)
Basophils Absolute: 0 K/uL (ref 0.0–0.1)
Basophils Relative: 0 %
Eosinophils Absolute: 0.2 K/uL (ref 0.0–0.5)
Eosinophils Relative: 3 %
HCT: 32.5 % — ABNORMAL LOW (ref 36.0–46.0)
Hemoglobin: 11.2 g/dL — ABNORMAL LOW (ref 12.0–15.0)
Immature Granulocytes: 1 %
Lymphocytes Relative: 20 %
Lymphs Abs: 1.2 K/uL (ref 0.7–4.0)
MCH: 34.3 pg — ABNORMAL HIGH (ref 26.0–34.0)
MCHC: 34.5 g/dL (ref 30.0–36.0)
MCV: 99.4 fL (ref 80.0–100.0)
Monocytes Absolute: 0.4 K/uL (ref 0.1–1.0)
Monocytes Relative: 6 %
Neutro Abs: 4.2 K/uL (ref 1.7–7.7)
Neutrophils Relative %: 70 %
Platelet Count: 124 K/uL — ABNORMAL LOW (ref 150–400)
RBC: 3.27 MIL/uL — ABNORMAL LOW (ref 3.87–5.11)
RDW: 14.6 % (ref 11.5–15.5)
WBC Count: 6.1 K/uL (ref 4.0–10.5)
nRBC: 0 % (ref 0.0–0.2)

## 2024-01-22 LAB — HCG, SERUM, QUALITATIVE: Preg, Serum: NEGATIVE

## 2024-01-22 LAB — CMP (CANCER CENTER ONLY)
ALT: 101 U/L — ABNORMAL HIGH (ref 0–44)
AST: 92 U/L — ABNORMAL HIGH (ref 15–41)
Albumin: 4.5 g/dL (ref 3.5–5.0)
Alkaline Phosphatase: 195 U/L — ABNORMAL HIGH (ref 38–126)
Anion gap: 11 (ref 5–15)
BUN: 14 mg/dL (ref 6–20)
CO2: 26 mmol/L (ref 22–32)
Calcium: 10.6 mg/dL — ABNORMAL HIGH (ref 8.9–10.3)
Chloride: 102 mmol/L (ref 98–111)
Creatinine: 0.6 mg/dL (ref 0.44–1.00)
GFR, Estimated: >60 mL/min (ref >60)
Glucose, Bld: 133 mg/dL — ABNORMAL HIGH (ref 70–99)
Potassium: 4.1 mmol/L (ref 3.5–5.1)
Sodium: 139 mmol/L (ref 135–145)
Total Bilirubin: 0.4 mg/dL (ref 0.0–1.2)
Total Protein: 7.5 g/dL (ref 6.5–8.1)

## 2024-01-22 MED ORDER — SODIUM CHLORIDE 0.9 % IV SOLN: INTRAVENOUS | Status: DC

## 2024-01-22 MED ORDER — SODIUM CHLORIDE 0.9 % IV SOLN
150.0000 mg | Freq: Once | INTRAVENOUS | Status: AC
  Administered 2024-01-22: 150 mg via INTRAVENOUS
  Filled 2024-01-22: qty 150

## 2024-01-22 MED ORDER — DEXAMETHASONE SOD PHOSPHATE PF 10 MG/ML IJ SOLN
10.0000 mg | Freq: Once | INTRAMUSCULAR | Status: AC
  Administered 2024-01-22: 10 mg via INTRAVENOUS

## 2024-01-22 MED ORDER — ATROPINE SULFATE 1 MG/ML IV SOLN
0.5000 mg | Freq: Once | INTRAVENOUS | Status: AC | PRN
  Administered 2024-01-22: 0.5 mg via INTRAVENOUS
  Filled 2024-01-22: qty 1

## 2024-01-22 MED ORDER — PALONOSETRON HCL INJECTION 0.25 MG/5ML
0.2500 mg | Freq: Once | INTRAVENOUS | Status: AC
  Administered 2024-01-22: 0.25 mg via INTRAVENOUS
  Filled 2024-01-22: qty 5

## 2024-01-22 MED ORDER — SODIUM CHLORIDE 0.9 % IV SOLN
2400.0000 mg/m2 | INTRAVENOUS | Status: DC
  Administered 2024-01-22: 5000 mg via INTRAVENOUS
  Filled 2024-01-22: qty 90

## 2024-01-22 MED ORDER — SODIUM CHLORIDE 0.9 % IV SOLN
120.0000 mg/m2 | Freq: Once | INTRAVENOUS | Status: AC
  Administered 2024-01-22: 260 mg via INTRAVENOUS
  Filled 2024-01-22: qty 13

## 2024-01-22 MED ORDER — SODIUM CHLORIDE 0.9 % IV SOLN
400.0000 mg/m2 | Freq: Once | INTRAVENOUS | Status: AC
  Administered 2024-01-22: 856 mg via INTRAVENOUS
  Filled 2024-01-22: qty 42.8

## 2024-01-22 NOTE — Progress Notes (Signed)
 Patient seen by Dr. Arley Hof today  Vitals are within treatment parameters:Yes  OK with BP 128/93  Labs are within treatment parameters: No (Please specify and give further instructions.)  OK to proceed with AST 92 and ALT 101  Treatment plan has been signed: Yes   Per physician team, Patient is ready for treatment and there are NO modifications to the treatment plan.

## 2024-01-22 NOTE — Progress Notes (Signed)
 [facility] OFFICE PROGRESS NOTE   Diagnosis: Pancreas cancer  INTERVAL HISTORY:   Debbie [name] completed another cycle of chemotherapy on 01/08/2024.  She reports mild nausea for several days following chemotherapy.  No emesis.  No diarrhea.  Neuropathy symptoms have resolved in the right hand.  She has persistent numbness in the left fingers and toes.  Good appetite.  Objective:  Vital signs in last 24 hours:  Blood pressure (!) 128/93, pulse 83, temperature 97.9 F (36.6 C), temperature source Temporal, resp. rate 18, height 5' 3 (1.6 m), weight 234 lb (106.1 kg), SpO2 98%.    HEENT: Mild white coat over the tongue, no buccal thrush Resp: Lungs clear bilaterally Cardio: Regular rate and rhythm GI: No hepatosplenomegaly, nontender, no mass Vascular: No leg edema  Skin: 1 cm cutaneous nodule at the right lower lateral chest wall  Portacath/PICC-without erythema  Lab Results:  Lab Results  Component Value Date   WBC 6.1 01/22/2024   HGB 11.2 (L) 01/22/2024   HCT 32.5 (L) 01/22/2024   MCV 99.4 01/22/2024   PLT 124 (L) 01/22/2024   NEUTROABS 4.2 01/22/2024    CMP  Lab Results  Component Value Date   NA 139 01/22/2024   K 4.1 01/22/2024   CL 102 01/22/2024   CO2 26 01/22/2024   GLUCOSE 133 (H) 01/22/2024   BUN 14 01/22/2024   CREATININE 0.60 01/22/2024   CALCIUM  10.6 (H) 01/22/2024   PROT 7.5 01/22/2024   ALBUMIN 4.5 01/22/2024   AST 92 (H) 01/22/2024   ALT 101 (H) 01/22/2024   ALKPHOS 195 (H) 01/22/2024   BILITOT 0.4 01/22/2024   GFRNONAA >60 01/22/2024   GFRAA >60 10/03/2015    Lab Results  Component Value Date   RJW800 12,054 (H) 01/08/2024     Medications: I have reviewed the patient's current medications.   Assessment/Plan: Pancreas cancer CT abdomen/pelvis 03/24/2023: 3 x 4.6 cm pancreas tail mass, multiple liver lesions consistent with metastases, fat stranding surrounding the second and third ports of the duodenum and pancreas  head/uncinate  CT chest 03/27/2023: New spiculated nodular density in the right lower lobe, 8 x 4 mm EUS 03/27/2023: Pancreas tail mass, liver lesions, T2 N0 M1, needle biopsy of pancreas mass, adenocarcinoma Foundation 1: K-ras G 12R, HRD signature negative, MSS, tumor mutation burden 0, PD-L1 TPS 1% Elevated CA 19-9 (121,720 on 03/26/2023) Cycle 1 FOLFIRINOX 04/10/2023 Chemotherapy held 04/24/2023 due to neutropenia Cycle 2 FOLFIRINOX 05/04/2023, Udenyca  Cycle 3 FOLFIRINOX 05/22/2023, Udenyca  Cycle 4 FOLFIRINOX 06/05/2023, Udenyca  Cycle 5 FOLFIRINOX 06/19/2023, Udenyca  CTs 06/26/2023: Stable pancreas mass, slight increase in size and number of hepatic metastases, improved ill-defined right lower lobe nodule, new nonspecific small groundglass nodules, improved stranding at the pancreas head/neck (review of CTs with patient-pancreas mass appears slightly smaller, several liver lesions are slightly larger others measured stable or slightly smaller Cycle 6 FOLFIRINOX 07/04/2023, Udenyca  Cycle 7 FOLFIRINOX 07/17/2023, Udenyca ; oxaliplatin  held due to neuropathy and thrombocytopenia Cycle 8 FOLFIRINOX 07/31/2023, Udenyca ; oxaliplatin  held due to neuropathy, irinotecan  dose reduced due to elevated AST/ALT. CTs 08/09/2023-improvement in hepatic metastases.  Decrease in size of pancreatic tail mass.  No evidence of metastatic disease within the chest. Cycle 9 FOLFIRINOX 08/16/2023, Udenyca ; oxaliplatin  held due to neuropathy Cycle 10 FOLFIRINOX 08/29/2023, Udenyca , oxaliplatin  held due to neuropathy Cycle 11 FOLFIRINOX 09/11/2023, Udenyca , oxaliplatin  held due to neuropathy Cycle 12 FOLFIRINOX 09/25/2023, Udenyca , oxaliplatin  held due to neuropathy Cycle 13 FOLFIRINOX 10/11/2023, Udenyca , oxaliplatin  held due to neuropathy  CTs 10/19/2023:  Decrease in the pancreas tail mass, stable to slightly smaller hepatic lesions, no new lesions, 4 mm right lower lobe nodule present on 2 previous CTs Cycle 14 FOLFIRINOX 10/24/2023, Udenyca ,  oxaliplatin  held due to neuropathy Cycle 15 FOLFIRINOX 11/06/2023, Udenyca , oxaliplatin  held due to neuropathy Cycle 16 FOLFIRINOX 11/21/2023, Udenyca , oxaliplatin  held due to neuropathy Cycle 17 FOLFIRINOX 12/04/2023, Udenyca , oxaliplatin  held due to neuropathy Cycle 18 FOLFIRINOX 12/18/2023, Udenyca , oxaliplatin  held due to neuropathy CTs 12/29/2023-stable to slight decrease size of pancreatic tail lesion.  Bilobar hepatic metastatic lesions overall similar to slightly decreased.  Stable 4 mm right lower lobe pulmonary nodule. Cycle 19 FOLFIRINOX 01/08/2024, Udenyca , oxaliplatin  held due to neuropathy Cycle 20 FOLFIRINOX 01/22/2024, Udenyca , oxaliplatin  held due to neuropathy   2.  Diabetes 3.  Acute abdominal pain and nausea/vomiting 03/23/2023 secondary to pancreatitis.  Improved 03/31/2023 4.  Hypertension. 5.  Asthma 6.  Hypothyroidism 7.  Hyperlipidemia 8.  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  She will complete another cycle of FOLFIRINOX today.  Oxaliplatin  remains on hold due to neuropathy symptoms.  She will return for an office visit and chemotherapy in 3 weeks.  We will follow-up on the CA 19-9 from today.  Arley Hof, MD  01/22/2024  10:06 AM

## 2024-01-22 NOTE — Patient Instructions (Signed)
 [facility] - A DEPT OF [facility]  Discharge Instructions: Thank you for choosing [facility] to provide your oncology and hematology care.   If you have a lab appointment with the Cancer Center, please go directly to the Cancer Center and check in at the registration area.   Wear comfortable clothing and clothing appropriate for easy access to any Portacath or PICC line.   We strive to give you quality time with your provider. You may need to reschedule your appointment if you arrive late (15 or more minutes).  Arriving late affects you and other patients whose appointments are after yours.  Also, if you miss three or more appointments without notifying the office, you may be dismissed from the clinic at the provider's discretion.      For prescription refill requests, have your pharmacy contact our office and allow 72 hours for refills to be completed.    Today you received the following chemotherapy and/or immunotherapy agents: leucovorin , irinotecan , fluorouracil        To help prevent nausea and vomiting after your treatment, we encourage you to take your nausea medication as directed.  BELOW ARE SYMPTOMS THAT SHOULD BE REPORTED IMMEDIATELY: *FEVER GREATER THAN 100.4 F (38 C) OR HIGHER *CHILLS OR SWEATING *NAUSEA AND VOMITING THAT IS NOT CONTROLLED WITH YOUR NAUSEA MEDICATION *UNUSUAL SHORTNESS OF BREATH *UNUSUAL BRUISING OR BLEEDING *URINARY PROBLEMS (pain or burning when urinating, or frequent urination) *BOWEL PROBLEMS (unusual diarrhea, constipation, pain near the anus) TENDERNESS IN MOUTH AND THROAT WITH OR WITHOUT PRESENCE OF ULCERS (sore throat, sores in mouth, or a toothache) UNUSUAL RASH, SWELLING OR PAIN  UNUSUAL VAGINAL DISCHARGE OR ITCHING   Items with * indicate a potential emergency and should be followed up as soon as possible or go to the Emergency Department if any problems should occur.  Please show the CHEMOTHERAPY  ALERT CARD or IMMUNOTHERAPY ALERT CARD at check-in to the Emergency Department and triage nurse.  Should you have questions after your visit or need to cancel or reschedule your appointment, please contact [facility] - A DEPT OF [facility]  Dept: [phone]  and follow the prompts.  Office hours are 8:00 a.m. to 4:30 p.m. Monday - Friday. Please note that voicemails left after 4:00 p.m. may not be returned until the following business day.  We are closed weekends and major holidays. You have access to a nurse at all times for urgent questions. Please call the main number to the clinic Dept: [phone] and follow the prompts.   For any non-urgent questions, you may also contact your provider using MyChart. We now offer e-Visits for anyone 29 and older to request care online for non-urgent symptoms. For details visit [email or web address].   Also download the MyChart app! Go to the app store, search MyChart, open the app, select [place], and log in with your MyChart username and password.

## 2024-01-22 NOTE — Patient Instructions (Signed)

## 2024-01-23 ENCOUNTER — Inpatient Hospital Stay

## 2024-01-23 LAB — CANCER ANTIGEN 19-9: CA 19-9: 13064 U/mL — ABNORMAL HIGH (ref 0–35)

## 2024-01-24 ENCOUNTER — Inpatient Hospital Stay

## 2024-01-24 ENCOUNTER — Other Ambulatory Visit: Payer: Self-pay

## 2024-01-24 VITALS — BP 122/73 | HR 65 | Temp 98.1°F | Resp 18

## 2024-01-24 DIAGNOSIS — Z5111 Encounter for antineoplastic chemotherapy: Secondary | ICD-10-CM | POA: Diagnosis not present

## 2024-01-24 DIAGNOSIS — C259 Malignant neoplasm of pancreas, unspecified: Secondary | ICD-10-CM

## 2024-01-24 MED ORDER — PEGFILGRASTIM-CBQV 6 MG/0.6ML ~~LOC~~ SOSY
6.0000 mg | PREFILLED_SYRINGE | Freq: Once | SUBCUTANEOUS | Status: AC
  Administered 2024-01-24: 6 mg via SUBCUTANEOUS
  Filled 2024-01-24: qty 0.6

## 2024-01-24 NOTE — Patient Instructions (Signed)

## 2024-02-06 ENCOUNTER — Ambulatory Visit: Admitting: Internal Medicine

## 2024-02-11 ENCOUNTER — Other Ambulatory Visit: Payer: Self-pay | Admitting: Oncology

## 2024-02-11 DIAGNOSIS — C259 Malignant neoplasm of pancreas, unspecified: Secondary | ICD-10-CM

## 2024-02-12 ENCOUNTER — Inpatient Hospital Stay

## 2024-02-12 ENCOUNTER — Inpatient Hospital Stay (HOSPITAL_BASED_OUTPATIENT_CLINIC_OR_DEPARTMENT_OTHER): Admitting: Oncology

## 2024-02-12 ENCOUNTER — Encounter: Payer: Self-pay | Admitting: Oncology

## 2024-02-12 ENCOUNTER — Inpatient Hospital Stay: Admitting: Nurse Practitioner

## 2024-02-12 ENCOUNTER — Inpatient Hospital Stay: Admitting: Oncology

## 2024-02-12 VITALS — BP 148/93 | HR 72 | Temp 97.9°F | Resp 18 | Ht 63.0 in | Wt 234.8 lb

## 2024-02-12 VITALS — BP 153/92 | HR 70 | Temp 98.1°F | Resp 18

## 2024-02-12 DIAGNOSIS — C259 Malignant neoplasm of pancreas, unspecified: Secondary | ICD-10-CM

## 2024-02-12 DIAGNOSIS — Z5111 Encounter for antineoplastic chemotherapy: Secondary | ICD-10-CM | POA: Diagnosis not present

## 2024-02-12 LAB — CMP (CANCER CENTER ONLY)
ALT: 84 U/L — ABNORMAL HIGH (ref 0–44)
AST: 89 U/L — ABNORMAL HIGH (ref 15–41)
Albumin: 4.4 g/dL (ref 3.5–5.0)
Alkaline Phosphatase: 171 U/L — ABNORMAL HIGH (ref 38–126)
Anion gap: 12 (ref 5–15)
BUN: 12 mg/dL (ref 6–20)
CO2: 27 mmol/L (ref 22–32)
Calcium: 10 mg/dL (ref 8.9–10.3)
Chloride: 101 mmol/L (ref 98–111)
Creatinine: 0.7 mg/dL (ref 0.44–1.00)
GFR, Estimated: >60 mL/min (ref >60)
Glucose, Bld: 133 mg/dL — ABNORMAL HIGH (ref 70–99)
Potassium: 4 mmol/L (ref 3.5–5.1)
Sodium: 140 mmol/L (ref 135–145)
Total Bilirubin: 0.4 mg/dL (ref 0.0–1.2)
Total Protein: 7.3 g/dL (ref 6.5–8.1)

## 2024-02-12 LAB — CBC WITH DIFFERENTIAL (CANCER CENTER ONLY)
Abs Immature Granulocytes: 0.01 K/uL (ref 0.00–0.07)
Basophils Absolute: 0 K/uL (ref 0.0–0.1)
Basophils Relative: 1 %
Eosinophils Absolute: 0.1 K/uL (ref 0.0–0.5)
Eosinophils Relative: 4 %
HCT: 34.2 % — ABNORMAL LOW (ref 36.0–46.0)
Hemoglobin: 11.7 g/dL — ABNORMAL LOW (ref 12.0–15.0)
Immature Granulocytes: 0 %
Lymphocytes Relative: 31 %
Lymphs Abs: 1.1 K/uL (ref 0.7–4.0)
MCH: 33.7 pg (ref 26.0–34.0)
MCHC: 34.2 g/dL (ref 30.0–36.0)
MCV: 98.6 fL (ref 80.0–100.0)
Monocytes Absolute: 0.3 K/uL (ref 0.1–1.0)
Monocytes Relative: 9 %
Neutro Abs: 2 K/uL (ref 1.7–7.7)
Neutrophils Relative %: 55 %
Platelet Count: 187 K/uL (ref 150–400)
RBC: 3.47 MIL/uL — ABNORMAL LOW (ref 3.87–5.11)
RDW: 14.6 % (ref 11.5–15.5)
WBC Count: 3.6 K/uL — ABNORMAL LOW (ref 4.0–10.5)
nRBC: 0 % (ref 0.0–0.2)

## 2024-02-12 LAB — HCG, SERUM, QUALITATIVE: Preg, Serum: NEGATIVE

## 2024-02-12 MED ORDER — SODIUM CHLORIDE 0.9 % IV SOLN
150.0000 mg | Freq: Once | INTRAVENOUS | Status: AC
  Administered 2024-02-12: 150 mg via INTRAVENOUS
  Filled 2024-02-12: qty 150

## 2024-02-12 MED ORDER — TRAMADOL HCL 50 MG PO TABS: 50.0000 mg | ORAL_TABLET | Freq: Four times a day (QID) | ORAL | 0 refills | Status: DC | PRN

## 2024-02-12 MED ORDER — ATROPINE SULFATE 1 MG/ML IV SOLN
0.5000 mg | Freq: Once | INTRAVENOUS | Status: AC | PRN
  Administered 2024-02-12: 0.5 mg via INTRAVENOUS
  Filled 2024-02-12: qty 1

## 2024-02-12 MED ORDER — PALONOSETRON HCL INJECTION 0.25 MG/5ML
0.2500 mg | Freq: Once | INTRAVENOUS | Status: AC
  Administered 2024-02-12: 0.25 mg via INTRAVENOUS
  Filled 2024-02-12: qty 5

## 2024-02-12 MED ORDER — DEXAMETHASONE SOD PHOSPHATE PF 10 MG/ML IJ SOLN
10.0000 mg | Freq: Once | INTRAMUSCULAR | Status: AC
  Administered 2024-02-12: 10 mg via INTRAVENOUS

## 2024-02-12 MED ORDER — SODIUM CHLORIDE 0.9 % IV SOLN
400.0000 mg/m2 | Freq: Once | INTRAVENOUS | Status: AC
  Administered 2024-02-12: 856 mg via INTRAVENOUS
  Filled 2024-02-12: qty 42.8

## 2024-02-12 MED ORDER — SODIUM CHLORIDE 0.9 % IV SOLN
120.0000 mg/m2 | Freq: Once | INTRAVENOUS | Status: AC
  Administered 2024-02-12: 260 mg via INTRAVENOUS
  Filled 2024-02-12: qty 13

## 2024-02-12 MED ORDER — SODIUM CHLORIDE 0.9 % IV SOLN
2400.0000 mg/m2 | INTRAVENOUS | Status: DC
  Administered 2024-02-12: 5000 mg via INTRAVENOUS
  Filled 2024-02-12: qty 100

## 2024-02-12 MED ORDER — SODIUM CHLORIDE 0.9 % IV SOLN: INTRAVENOUS | Status: DC

## 2024-02-12 NOTE — Progress Notes (Signed)
 [facility] OFFICE PROGRESS NOTE   Diagnosis: Pancreas cancer  INTERVAL HISTORY:   Debbie [name] completed another cycle of chemotherapy 01/22/2024.  She reports mild nausea for a few days following chemotherapy.  No diarrhea.  She continues to have neuropathy symptoms in the feet.  She reports pain at the left side of the head and neck for the past month.  The pain is constant.  No arm numbness or weakness.  She uses Tylenol  and ibuprofen for pain.  Objective:  Vital signs in last 24 hours:  Blood pressure (!) 148/93, pulse 72, temperature 97.9 F (36.6 C), temperature source Temporal, resp. rate 18, height 5' 3 (1.6 m), weight 234 lb 12.8 oz (106.5 kg), SpO2 98%.    HEENT: No thrush or ulcers.  Tender at the upper cervical spine, no mass Lymphatics: No cervical or supraclavicular nodes Resp: Lungs clear bilaterally Cardio: Regular rate and rhythm GI: No hepatosplenomegaly, nontender Vascular: No leg edema Neuro: The motor exam is intact in the arms bilaterally Skin: No scalp or face rash  Portacath/PICC-without erythema  Lab Results:  Lab Results  Component Value Date   WBC 3.6 (L) 02/12/2024   HGB 11.7 (L) 02/12/2024   HCT 34.2 (L) 02/12/2024   MCV 98.6 02/12/2024   PLT 187 02/12/2024   NEUTROABS 2.0 02/12/2024    CMP  Lab Results  Component Value Date   NA 139 01/22/2024   K 4.1 01/22/2024   CL 102 01/22/2024   CO2 26 01/22/2024   GLUCOSE 133 (H) 01/22/2024   BUN 14 01/22/2024   CREATININE 0.60 01/22/2024   CALCIUM  10.6 (H) 01/22/2024   PROT 7.5 01/22/2024   ALBUMIN 4.5 01/22/2024   AST 92 (H) 01/22/2024   ALT 101 (H) 01/22/2024   ALKPHOS 195 (H) 01/22/2024   BILITOT 0.4 01/22/2024   GFRNONAA >60 01/22/2024   GFRAA >60 10/03/2015    Lab Results  Component Value Date   RJW800 13,064 (H) 01/22/2024    No results found for: INR, LABPROT  Imaging:  No results found.  Medications: I have reviewed the patient's current  medications.   Assessment/Plan:  Pancreas cancer CT abdomen/pelvis 03/24/2023: 3 x 4.6 cm pancreas tail mass, multiple liver lesions consistent with metastases, fat stranding surrounding the second and third ports of the duodenum and pancreas head/uncinate  CT chest 03/27/2023: New spiculated nodular density in the right lower lobe, 8 x 4 mm EUS 03/27/2023: Pancreas tail mass, liver lesions, T2 N0 M1, needle biopsy of pancreas mass, adenocarcinoma Foundation 1: K-ras G 12R, HRD signature negative, MSS, tumor mutation burden 0, PD-L1 TPS 1% Elevated CA 19-9 (121,720 on 03/26/2023) Cycle 1 FOLFIRINOX 04/10/2023 Chemotherapy held 04/24/2023 due to neutropenia Cycle 2 FOLFIRINOX 05/04/2023, Udenyca  Cycle 3 FOLFIRINOX 05/22/2023, Udenyca  Cycle 4 FOLFIRINOX 06/05/2023, Udenyca  Cycle 5 FOLFIRINOX 06/19/2023, Udenyca  CTs 06/26/2023: Stable pancreas mass, slight increase in size and number of hepatic metastases, improved ill-defined right lower lobe nodule, new nonspecific small groundglass nodules, improved stranding at the pancreas head/neck (review of CTs with patient-pancreas mass appears slightly smaller, several liver lesions are slightly larger others measured stable or slightly smaller Cycle 6 FOLFIRINOX 07/04/2023, Udenyca  Cycle 7 FOLFIRINOX 07/17/2023, Udenyca ; oxaliplatin  held due to neuropathy and thrombocytopenia Cycle 8 FOLFIRINOX 07/31/2023, Udenyca ; oxaliplatin  held due to neuropathy, irinotecan  dose reduced due to elevated AST/ALT. CTs 08/09/2023-improvement in hepatic metastases.  Decrease in size of pancreatic tail mass.  No evidence of metastatic disease within the chest. Cycle 9 FOLFIRINOX 08/16/2023, Udenyca ; oxaliplatin  held  due to neuropathy Cycle 10 FOLFIRINOX 08/29/2023, Udenyca , oxaliplatin  held due to neuropathy Cycle 11 FOLFIRINOX 09/11/2023, Udenyca , oxaliplatin  held due to neuropathy Cycle 12 FOLFIRINOX 09/25/2023, Udenyca , oxaliplatin  held due to neuropathy Cycle 13 FOLFIRINOX 10/11/2023,  Udenyca , oxaliplatin  held due to neuropathy  CTs 10/19/2023: Decrease in the pancreas tail mass, stable to slightly smaller hepatic lesions, no new lesions, 4 mm right lower lobe nodule present on 2 previous CTs Cycle 14 FOLFIRINOX 10/24/2023, Udenyca , oxaliplatin  held due to neuropathy Cycle 15 FOLFIRINOX 11/06/2023, Udenyca , oxaliplatin  held due to neuropathy Cycle 16 FOLFIRINOX 11/21/2023, Udenyca , oxaliplatin  held due to neuropathy Cycle 17 FOLFIRINOX 12/04/2023, Udenyca , oxaliplatin  held due to neuropathy Cycle 18 FOLFIRINOX 12/18/2023, Udenyca , oxaliplatin  held due to neuropathy CTs 12/29/2023-stable to slight decrease size of pancreatic tail lesion.  Bilobar hepatic metastatic lesions overall similar to slightly decreased.  Stable 4 mm right lower lobe pulmonary nodule. Cycle 19 FOLFIRINOX 01/08/2024, Udenyca , oxaliplatin  held due to neuropathy Cycle 20 FOLFIRINOX 01/22/2024, Udenyca , oxaliplatin  held due to neuropathy Cycle 21 FOLFIRINOX 02/12/2024, Udenyca , oxaliplatin  held due to neuropathy   2.  Diabetes 3.  Acute abdominal pain and nausea/vomiting 03/23/2023 secondary to pancreatitis.  Improved 03/31/2023 4.  Hypertension. 5.  Asthma 6.  Hypothyroidism 7.  Hyperlipidemia 8.  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  She continues treatment with 5-FU/irinotecan .  She is tolerating the treatment well.  She will complete another cycle today.  She has developed pain at the left side of the head and upper neck for the past month.  The etiology of the pain is unclear.  We discussed the differential diagnosis including cervical disc disease and metastatic pancreas cancer.  She will be referred for CTs of the head and neck this week.  I prescribed tramadol  to use as needed for pain.  She will return for an office visit and chemotherapy in 3 weeks.  We  will follow-up on results of the CT in the interim.  Arley Hof, MD  02/12/2024  8:10 AM

## 2024-02-12 NOTE — Patient Instructions (Signed)
 [facility] - A DEPT OF [facility]  Discharge Instructions: Thank you for choosing [facility] to provide your oncology and hematology care.   If you have a lab appointment with the Cancer Center, please go directly to the Cancer Center and check in at the registration area.   Wear comfortable clothing and clothing appropriate for easy access to any Portacath or PICC line.   We strive to give you quality time with your provider. You may need to reschedule your appointment if you arrive late (15 or more minutes).  Arriving late affects you and other patients whose appointments are after yours.  Also, if you miss three or more appointments without notifying the office, you may be dismissed from the clinic at the provider's discretion.      For prescription refill requests, have your pharmacy contact our office and allow 72 hours for refills to be completed.    Today you received the following chemotherapy and/or immunotherapy agents: leucovorin , irinotecan , fluorouracil        To help prevent nausea and vomiting after your treatment, we encourage you to take your nausea medication as directed.  BELOW ARE SYMPTOMS THAT SHOULD BE REPORTED IMMEDIATELY: *FEVER GREATER THAN 100.4 F (38 C) OR HIGHER *CHILLS OR SWEATING *NAUSEA AND VOMITING THAT IS NOT CONTROLLED WITH YOUR NAUSEA MEDICATION *UNUSUAL SHORTNESS OF BREATH *UNUSUAL BRUISING OR BLEEDING *URINARY PROBLEMS (pain or burning when urinating, or frequent urination) *BOWEL PROBLEMS (unusual diarrhea, constipation, pain near the anus) TENDERNESS IN MOUTH AND THROAT WITH OR WITHOUT PRESENCE OF ULCERS (sore throat, sores in mouth, or a toothache) UNUSUAL RASH, SWELLING OR PAIN  UNUSUAL VAGINAL DISCHARGE OR ITCHING   Items with * indicate a potential emergency and should be followed up as soon as possible or go to the Emergency Department if any problems should occur.  Please show the CHEMOTHERAPY  ALERT CARD or IMMUNOTHERAPY ALERT CARD at check-in to the Emergency Department and triage nurse.  Should you have questions after your visit or need to cancel or reschedule your appointment, please contact [facility] - A DEPT OF [facility]  Dept: [phone]  and follow the prompts.  Office hours are 8:00 a.m. to 4:30 p.m. Monday - Friday. Please note that voicemails left after 4:00 p.m. may not be returned until the following business day.  We are closed weekends and major holidays. You have access to a nurse at all times for urgent questions. Please call the main number to the clinic Dept: [phone] and follow the prompts.   For any non-urgent questions, you may also contact your provider using MyChart. We now offer e-Visits for anyone 29 and older to request care online for non-urgent symptoms. For details visit [email or web address].   Also download the MyChart app! Go to the app store, search MyChart, open the app, select [place], and log in with your MyChart username and password.

## 2024-02-12 NOTE — Progress Notes (Signed)
 Patient seen by Dr. Arley Hof today  Vitals are within treatment parameters:Yes   Labs are within treatment parameters: Yes   Treatment plan has been signed: Yes   Per physician team, Patient is ready for treatment and there are NO modifications to the treatment plan.

## 2024-02-13 LAB — CANCER ANTIGEN 19-9: CA 19-9: 19450 U/mL — ABNORMAL HIGH (ref 0–35)

## 2024-02-14 ENCOUNTER — Ambulatory Visit (HOSPITAL_BASED_OUTPATIENT_CLINIC_OR_DEPARTMENT_OTHER)
Admission: RE | Admit: 2024-02-14 | Discharge: 2024-02-14 | Disposition: A | Discharge status: Home / Self Care | Source: Ambulatory Visit | Attending: Oncology | Admitting: Oncology

## 2024-02-14 ENCOUNTER — Other Ambulatory Visit: Payer: Self-pay

## 2024-02-14 ENCOUNTER — Inpatient Hospital Stay

## 2024-02-14 ENCOUNTER — Other Ambulatory Visit (HOSPITAL_COMMUNITY)

## 2024-02-14 VITALS — BP 134/87 | HR 71 | Temp 98.4°F | Resp 18

## 2024-02-14 DIAGNOSIS — C259 Malignant neoplasm of pancreas, unspecified: Secondary | ICD-10-CM | POA: Insufficient documentation

## 2024-02-14 DIAGNOSIS — Z5111 Encounter for antineoplastic chemotherapy: Secondary | ICD-10-CM | POA: Diagnosis not present

## 2024-02-14 MED ORDER — HEPARIN SOD (PORK) LOCK FLUSH 100 UNIT/ML IV SOLN
500.0000 [IU] | Freq: Once | INTRAVENOUS | Status: AC
  Administered 2024-02-14: 500 [IU] via INTRAVENOUS

## 2024-02-14 MED ORDER — PEGFILGRASTIM-CBQV 6 MG/0.6ML ~~LOC~~ SOSY
6.0000 mg | PREFILLED_SYRINGE | Freq: Once | SUBCUTANEOUS | Status: AC
  Administered 2024-02-14: 6 mg via SUBCUTANEOUS
  Filled 2024-02-14: qty 0.6

## 2024-02-14 MED ORDER — IOHEXOL 300 MG/ML  SOLN
100.0000 mL | Freq: Once | INTRAMUSCULAR | Status: AC | PRN
  Administered 2024-02-14: 75 mL via INTRAVENOUS

## 2024-02-14 NOTE — Patient Instructions (Signed)

## 2024-02-18 ENCOUNTER — Encounter: Payer: Self-pay | Admitting: Internal Medicine

## 2024-02-20 ENCOUNTER — Other Ambulatory Visit (HOSPITAL_COMMUNITY): Payer: Self-pay

## 2024-02-20 ENCOUNTER — Telehealth: Payer: Self-pay

## 2024-02-20 NOTE — Telephone Encounter (Signed)
 Pharmacy Patient Advocate Encounter   Received notification from Onbase that prior authorization for Dexcom G7 Sensor is required/requested.   Insurance verification completed.   The patient is insured through [facility].   Per test claim: Refill too soon. PA is not needed at this time. Medication was filled 02/18/24. Next eligible fill date is 04/26/24.

## 2024-02-22 ENCOUNTER — Other Ambulatory Visit: Payer: Self-pay | Admitting: Internal Medicine

## 2024-02-22 DIAGNOSIS — E039 Hypothyroidism, unspecified: Secondary | ICD-10-CM

## 2024-02-22 NOTE — Telephone Encounter (Signed)
 read by [name] at 2:36PM on 02/21/2024.

## 2024-02-23 ENCOUNTER — Ambulatory Visit: Admitting: Internal Medicine

## 2024-02-29 ENCOUNTER — Telehealth: Payer: Self-pay

## 2024-02-29 ENCOUNTER — Ambulatory Visit: Admitting: Internal Medicine

## 2024-02-29 ENCOUNTER — Encounter: Payer: Self-pay | Admitting: Internal Medicine

## 2024-02-29 ENCOUNTER — Encounter (INDEPENDENT_AMBULATORY_CARE_PROVIDER_SITE_OTHER): Payer: Self-pay

## 2024-02-29 VITALS — BP 138/80 | HR 80 | Temp 98.0°F | Ht 63.0 in | Wt 233.8 lb

## 2024-02-29 DIAGNOSIS — J3489 Other specified disorders of nose and nasal sinuses: Secondary | ICD-10-CM

## 2024-02-29 DIAGNOSIS — R252 Cramp and spasm: Secondary | ICD-10-CM

## 2024-02-29 DIAGNOSIS — G620 Drug-induced polyneuropathy: Secondary | ICD-10-CM

## 2024-02-29 DIAGNOSIS — F419 Anxiety disorder, unspecified: Secondary | ICD-10-CM

## 2024-02-29 DIAGNOSIS — K2100 Gastro-esophageal reflux disease with esophagitis, without bleeding: Secondary | ICD-10-CM

## 2024-02-29 DIAGNOSIS — E1121 Type 2 diabetes mellitus with diabetic nephropathy: Secondary | ICD-10-CM

## 2024-02-29 DIAGNOSIS — H9202 Otalgia, left ear: Secondary | ICD-10-CM

## 2024-02-29 DIAGNOSIS — G893 Neoplasm related pain (acute) (chronic): Secondary | ICD-10-CM

## 2024-02-29 DIAGNOSIS — G500 Trigeminal neuralgia: Secondary | ICD-10-CM

## 2024-02-29 DIAGNOSIS — C259 Malignant neoplasm of pancreas, unspecified: Secondary | ICD-10-CM

## 2024-02-29 DIAGNOSIS — M5412 Radiculopathy, cervical region: Secondary | ICD-10-CM

## 2024-02-29 MED ORDER — LORAZEPAM 0.5 MG PO TABS: 0.5000 mg | ORAL_TABLET | Freq: Two times a day (BID) | ORAL | 1 refills | Status: AC

## 2024-02-29 MED ORDER — PREGABALIN 50 MG PO CAPS: 50.0000 mg | ORAL_CAPSULE | Freq: Three times a day (TID) | ORAL | 1 refills | Status: AC

## 2024-02-29 MED ORDER — FREESTYLE LIBRE 3 PLUS SENSOR MISC: 11 refills | Status: AC

## 2024-02-29 MED ORDER — CARBAMAZEPINE ER 100 MG PO CP12: 100.0000 mg | ORAL_CAPSULE | Freq: Two times a day (BID) | ORAL | 0 refills | Status: AC

## 2024-02-29 NOTE — Assessment & Plan Note (Addendum)
 Type 2 diabetes mellitus   Her diabetes is managed with insulin  glargine, but there are insurance issues with coverage. Blood glucose levels are well-controlled except during chemotherapy weeks due to steroid use. Prescribed Lantus  insulin  due to insurance coverage issues with current insulin . Continue monitoring blood glucose levels, especially during chemotherapy weeks.

## 2024-02-29 NOTE — Progress Notes (Signed)
 ==============================  [facility]: [phone]   -- Medical Office Visit --  Patient: Debbie [name]      Age: 53 y.o.       Sex:  female  Date:   02/29/2024 Today's Healthcare Provider: Bernardino KANDICE Cone, MD  ==============================   Chief Complaint: Discuss sinus issues (Has been to urgent care twice for abx has been on ) and Discuss cancer  Discussed the use of AI scribe software for clinical note transcription with the patient, who gave verbal consent to proceed.  History of Present Illness  53 year old female with stage four pancreatic cancer who presents with persistent neck pain and quality of life concerns.  The neck pain is also around her ear on the left and poorly localized and constant.  She has been undergoing chemotherapy for stage four pancreatic cancer since January, completing 22 cycles over the past year. She experiences significant fatigue and a decline in quality of life, wanting to discontinue chemotherapy due to these issues, even though it has been shrinking the tumor.  She has persistent neck pain, described as 'killing me right now,' primarily located in the neck and radiating to her ear, causing significant discomfort. She experiences pain in her neck and ear, and has been told it may be related to her sinuses. She has not found relief from sinus treatments, including two courses of antibiotics and steroids administered during chemotherapy sessions.  She has tried various medications for pain management, including Flexeril  and Tramadol , but reports limited relief. She also uses Nasonex and has tried xanax  for anxiety, but prefers not to use Xanax  due to adverse effects. She uses gummies to aid sleep, which help but do not provide a full night's rest due to frequent bathroom trips, attributed to chemotherapy and pancreatitis.  Her family history includes a grandmother who died of brain cancer, which prompted the CT  scan to rule out metastasis. She also has a history of pancreatitis, affecting her digestion and requiring her to locate restrooms quickly when out in public.  She is currently taking Tramadol  for pain, Nasonex for sinus issues, She has previously used hydrocodone for cough, which has since improved, and has oxycodone  available from a past prescription but has not used it.  Background Reviewed: Problem List: has AKI (acute kidney injury); Hypothyroidism; Hyperlipidemia; Seasonal allergies; Morbid obesity (HCC); Essential hypertension; History of endometrial ablation; Upper abdominal pain; Itching of ear; Other chronic sinusitis; Vertigo; Tympanosclerosis; Weakness on right side of face; Seasonal depression; Facial palsy; Diplopia; Blurry vision; Type 2 diabetes mellitus (HCC); Asthma, chronic; PCOS (polycystic ovarian syndrome); Acute pancreatitis; Pancreatic mass; Gastritis and gastroduodenitis; Mass of pancreas; Pancreatic adenocarcinoma (HCC); Diabetic nephropathy (HCC); and Genetic testing on their problem list. Past Medical History:  has a past medical history of Acute renal failure (ARF) (10/01/2015), Allergy, Depression, DKA, type 2 (HCC) (03/01/2023), Elevated cholesterol, History of endometrial ablation (05/20/2021), Hyperlipidemia, Hypertension, Hypertriglyceridemia (02/08/2022), Hypothyroidism, Insomnia (11/08/2021), Morbid obesity (HCC) (02/08/2019), New onset type 2 diabetes mellitus (HCC) (02/23/2023), Seasonal allergies, Sleep apnea, Thyroid disease, and Tympanosclerosis (07/11/2022). Past Surgical History:   has a past surgical history that includes Adenoidectomy; Dental surgery; Colonoscopy; Ablation (10/2019); Esophagogastroduodenoscopy (egd) with propofol  (N/A, 03/27/2023); biopsy (03/27/2023); EUS (03/27/2023); Fine needle aspiration (03/27/2023); and IR IMAGING GUIDED PORT INSERTION (04/03/2023). Social History:   reports that she has never smoked. She has never used smokeless tobacco. She  reports that she does not drink alcohol and does not use drugs. Family  History:  family history includes Breast cancer (age of onset: 16 - 69) in her maternal great-grandmother; CAD in her maternal grandfather; Cancer (age of onset: 52 - 28) in her maternal grandmother; Diabetes in her maternal grandfather; Heart disease in her father; Hyperlipidemia in her mother; Hypertension in her mother; Prostate cancer (age of onset: 82 - 73) in her maternal grandfather. Allergies:  is allergic to atorvastatin.   Medication Reconciliation: Current Outpatient Medications on File Prior to Visit  Medication Sig   amoxicillin -clavulanate (AUGMENTIN ) 875-125 MG tablet Take 1 tablet by mouth 2 (two) times daily. (Patient not taking: Reported on 02/12/2024)   Azelastine -Fluticasone  137-50 MCG/ACT SUSP PLACE 1 SPRAY INTO THE NOSE EVERY 12 (TWELVE) HOURS.   BD PEN NEEDLE NANO 2ND GEN 32G X 4 MM MISC USE AS DIRECTED 4 TIMES A DAY   Blood Glucose Monitoring Suppl DEVI 1 each by Does not apply route in the morning, at noon, and at bedtime. May substitute to any manufacturer covered by patient's insurance.   Continuous Glucose Sensor (DEXCOM G7 SENSOR) MISC 1 Act by Does not apply route daily.   cyclobenzaprine  (FLEXERIL ) 5 MG tablet Take 1 tablet (5 mg total) by mouth 3 (three) times daily as needed for muscle spasms.   escitalopram  (LEXAPRO ) 20 MG tablet TAKE 1 TABLET BY MOUTH EVERY DAY   Glucagon  (GVOKE HYPOPEN  2-PACK) 0.5 MG/0.1ML SOAJ Inject 0.5 mg into the skin daily as needed (for low blood sugar). (Patient not taking: Reported on 02/12/2024)   HYDROcodone bit-homatropine (HYCODAN) 5-1.5 MG/5ML syrup Take 5 mLs by mouth every 4 (four) hours as needed.   levocetirizine (XYZAL ) 5 MG tablet TAKE 1 TABLET BY MOUTH EVERY DAY IN THE EVENING   levothyroxine  (SYNTHROID ) 75 MCG tablet TAKE 1 TABLET BY MOUTH EVERY DAY   lidocaine -prilocaine  (EMLA ) cream Apply 1 Application topically as needed (Apply to port 1-2 hours prior  to use).   lipase/protease/amylase (CREON ) 36000 UNITS CPEP capsule Take 2 capsules (72,000 Units total) by mouth 3 (three) times daily before meals. (Patient not taking: Reported on 02/12/2024)   loperamide (IMODIUM) 2 MG capsule Take 2-4 mg by mouth as needed for diarrhea or loose stools. (Patient not taking: Reported on 02/12/2024)   losartan -hydrochlorothiazide  (HYZAAR) 50-12.5 MG tablet Take 0.5 tablets by mouth daily. Ok to start with just half tablet daily for week 1 and leave it there if goal of 140/90 reached.   magnesium  oxide (MAG-OX) 400 MG tablet TAKE 1 TABLET BY MOUTH TWICE A DAY   metFORMIN  (GLUCOPHAGE ) 500 MG tablet Take 1 tablet (500 mg total) by mouth 2 (two) times daily with a meal.   montelukast  (SINGULAIR ) 10 MG tablet Take 1 tablet by mouth every evening.   ondansetron  (ZOFRAN -ODT) 8 MG disintegrating tablet Take 1 tablet (8 mg total) by mouth every 8 (eight) hours as needed for nausea or vomiting. (Patient not taking: Reported on 02/12/2024)   potassium chloride  SA (KLOR-CON  M) 20 MEQ tablet TAKE 1 TABLET BY MOUTH EVERY DAY   prochlorperazine  (COMPAZINE ) 10 MG tablet Take 1 tablet (10 mg total) by mouth every 6 (six) hours as needed for nausea or vomiting.   traMADol  (ULTRAM ) 50 MG tablet Take 1 tablet (50 mg total) by mouth every 6 (six) hours as needed.   No current facility-administered medications on file prior to visit.   Medications Discontinued During This Encounter  Medication Reason   ALPRAZolam  (XANAX ) 0.25 MG tablet Completed Course   insulin  glargine-yfgn (SEMGLEE ) 100 UNIT/ML Pen Not covered by  the pt's insurance   pantoprazole  (PROTONIX ) 40 MG tablet Not covered by the pt's insurance     Physical Exam:    02/29/2024   10:59 AM 02/14/2024   10:47 AM 02/12/2024   12:36 PM  Vitals with BMI  Height 5' 3    Weight 233 lbs 13 oz    BMI 41.43    Systolic 138 134 846  Diastolic 80 87 92  Pulse 80 71 70  Vital signs reviewed.  Nursing notes reviewed.  Weight trend reviewed. Physical Activity: Insufficiently Active (02/27/2024)   Exercise Vital Sign    Days of Exercise per Week: 3 days    Minutes of Exercise per Session: 10 min   General Appearance:  No acute distress appreciable.   Well-groomed, healthy-appearing female.  Well proportioned with no abnormal fat distribution.  Good muscle tone. Pulmonary:  Normal work of breathing at rest, no respiratory distress apparent. SpO2: 98 %  Musculoskeletal: All extremities are intact.  Neurological:  Awake, alert, oriented, and engaged.  No obvious focal neurological deficits or cognitive impairments.  Sensorium seems unclouded.   Speech is clear and coherent with logical content. Psychiatric:  Appropriate mood, pleasant and cooperative demeanor, thoughtful and engaged during the exam   Verbalized to patient: Physical Exam     Results:   Verbalized to patient: Results RADIOLOGY Head CT: No tumor, moderate mucosal thickening with pneumatized secretions in the left sphenoid and maxillary sinuses, and right maxillary sinus.     02/29/2024   11:07 AM 02/12/2024    8:00 AM 01/24/2024   12:26 PM 01/22/2024    9:45 AM  PHQ 2/9 Scores  PHQ - 2 Score 1 0 0 0  PHQ- 9 Score 10       Infusion on 02/12/2024  Component Date Value Ref Range Status   Sodium 02/12/2024 140  135 - 145 mmol/L Final   Potassium 02/12/2024 4.0  3.5 - 5.1 mmol/L Final   Chloride 02/12/2024 101  98 - 111 mmol/L Final   CO2 02/12/2024 27  22 - 32 mmol/L Final   Glucose, Bld 02/12/2024 133 (H)  70 - 99 mg/dL Final   BUN 88/75/7974 12  6 - 20 mg/dL Final   Creatinine 88/75/7974 0.70  0.44 - 1.00 mg/dL Final   Calcium  02/12/2024 10.0  8.9 - 10.3 mg/dL Final   Total Protein 88/75/7974 7.3  6.5 - 8.1 g/dL Final   Albumin 88/75/7974 4.4  3.5 - 5.0 g/dL Final   AST 88/75/7974 89 (H)  15 - 41 U/L Final   ALT 02/12/2024 84 (H)  0 - 44 U/L Final   Alkaline Phosphatase 02/12/2024 171 (H)  38 - 126 U/L Final   Total  Bilirubin 02/12/2024 0.4  0.0 - 1.2 mg/dL Final   GFR, Estimated 02/12/2024 >60  >60 mL/min Final   Anion gap 02/12/2024 12  5 - 15 Final   WBC Count 02/12/2024 3.6 (L)  4.0 - 10.5 K/uL Final   RBC 02/12/2024 3.47 (L)  3.87 - 5.11 MIL/uL Final   Hemoglobin 02/12/2024 11.7 (L)  12.0 - 15.0 g/dL Final   HCT 88/75/7974 34.2 (L)  36.0 - 46.0 % Final   MCV 02/12/2024 98.6  80.0 - 100.0 fL Final   MCH 02/12/2024 33.7  26.0 - 34.0 pg Final   MCHC 02/12/2024 34.2  30.0 - 36.0 g/dL Final   RDW 88/75/7974 14.6  11.5 - 15.5 % Final   Platelet Count 02/12/2024 187  150 - 400 K/uL  Final   nRBC 02/12/2024 0.0  0.0 - 0.2 % Final   Neutrophils Relative % 02/12/2024 55  % Final   Neutro Abs 02/12/2024 2.0  1.7 - 7.7 K/uL Final   Lymphocytes Relative 02/12/2024 31  % Final   Lymphs Abs 02/12/2024 1.1  0.7 - 4.0 K/uL Final   Monocytes Relative 02/12/2024 9  % Final   Monocytes Absolute 02/12/2024 0.3  0.1 - 1.0 K/uL Final   Eosinophils Relative 02/12/2024 4  % Final   Eosinophils Absolute 02/12/2024 0.1  0.0 - 0.5 K/uL Final   Basophils Relative 02/12/2024 1  % Final   Basophils Absolute 02/12/2024 0.0  0.0 - 0.1 K/uL Final   Immature Granulocytes 02/12/2024 0  % Final   Abs Immature Granulocytes 02/12/2024 0.01  0.00 - 0.07 K/uL Final   Preg, Serum 02/12/2024 NEGATIVE  NEGATIVE Final   CA 19-9 02/12/2024 19,450 (H)  0 - 35 U/mL Final  Appointment on 01/22/2024  Component Date Value Ref Range Status   Sodium 01/22/2024 139  135 - 145 mmol/L Final   Potassium 01/22/2024 4.1  3.5 - 5.1 mmol/L Final   Chloride 01/22/2024 102  98 - 111 mmol/L Final   CO2 01/22/2024 26  22 - 32 mmol/L Final   Glucose, Bld 01/22/2024 133 (H)  70 - 99 mg/dL Final   BUN 88/96/7974 14  6 - 20 mg/dL Final   Creatinine 88/96/7974 0.60  0.44 - 1.00 mg/dL Final   Calcium  01/22/2024 10.6 (H)  8.9 - 10.3 mg/dL Final   Total Protein 88/96/7974 7.5  6.5 - 8.1 g/dL Final   Albumin 88/96/7974 4.5  3.5 - 5.0 g/dL Final   AST  88/96/7974 92 (H)  15 - 41 U/L Final   ALT 01/22/2024 101 (H)  0 - 44 U/L Final   Alkaline Phosphatase 01/22/2024 195 (H)  38 - 126 U/L Final   Total Bilirubin 01/22/2024 0.4  0.0 - 1.2 mg/dL Final   GFR, Estimated 01/22/2024 >60  >60 mL/min Final   Anion gap 01/22/2024 11  5 - 15 Final   WBC Count 01/22/2024 6.1  4.0 - 10.5 K/uL Final   RBC 01/22/2024 3.27 (L)  3.87 - 5.11 MIL/uL Final   Hemoglobin 01/22/2024 11.2 (L)  12.0 - 15.0 g/dL Final   HCT 88/96/7974 32.5 (L)  36.0 - 46.0 % Final   MCV 01/22/2024 99.4  80.0 - 100.0 fL Final   MCH 01/22/2024 34.3 (H)  26.0 - 34.0 pg Final   MCHC 01/22/2024 34.5  30.0 - 36.0 g/dL Final   RDW 88/96/7974 14.6  11.5 - 15.5 % Final   Platelet Count 01/22/2024 124 (L)  150 - 400 K/uL Final   nRBC 01/22/2024 0.0  0.0 - 0.2 % Final   Neutrophils Relative % 01/22/2024 70  % Final   Neutro Abs 01/22/2024 4.2  1.7 - 7.7 K/uL Final   Lymphocytes Relative 01/22/2024 20  % Final   Lymphs Abs 01/22/2024 1.2  0.7 - 4.0 K/uL Final   Monocytes Relative 01/22/2024 6  % Final   Monocytes Absolute 01/22/2024 0.4  0.1 - 1.0 K/uL Final   Eosinophils Relative 01/22/2024 3  % Final   Eosinophils Absolute 01/22/2024 0.2  0.0 - 0.5 K/uL Final   Basophils Relative 01/22/2024 0  % Final   Basophils Absolute 01/22/2024 0.0  0.0 - 0.1 K/uL Final   Immature Granulocytes 01/22/2024 1  % Final   Abs Immature Granulocytes 01/22/2024 0.06  0.00 -  0.07 K/uL Final   Preg, Serum 01/22/2024 NEGATIVE  NEGATIVE Final   CA 19-9 01/22/2024 13,064 (H)  0 - 35 U/mL Final  Appointment on 01/08/2024  Component Date Value Ref Range Status   Sodium 01/08/2024 139  135 - 145 mmol/L Final   Potassium 01/08/2024 3.9  3.5 - 5.1 mmol/L Final   Chloride 01/08/2024 101  98 - 111 mmol/L Final   CO2 01/08/2024 26  22 - 32 mmol/L Final   Glucose, Bld 01/08/2024 126 (H)  70 - 99 mg/dL Final   BUN 89/79/7974 14  6 - 20 mg/dL Final   Creatinine 89/79/7974 0.62  0.44 - 1.00 mg/dL Final   Calcium   01/08/2024 10.3  8.9 - 10.3 mg/dL Final   Total Protein 89/79/7974 7.4  6.5 - 8.1 g/dL Final   Albumin 89/79/7974 4.4  3.5 - 5.0 g/dL Final   AST 89/79/7974 66 (H)  15 - 41 U/L Final   ALT 01/08/2024 72 (H)  0 - 44 U/L Final   Alkaline Phosphatase 01/08/2024 185 (H)  38 - 126 U/L Final   Total Bilirubin 01/08/2024 0.5  0.0 - 1.2 mg/dL Final   GFR, Estimated 01/08/2024 >60  >60 mL/min Final   Anion gap 01/08/2024 13  5 - 15 Final   WBC Count 01/08/2024 4.4  4.0 - 10.5 K/uL Final   RBC 01/08/2024 3.36 (L)  3.87 - 5.11 MIL/uL Final   Hemoglobin 01/08/2024 11.4 (L)  12.0 - 15.0 g/dL Final   HCT 89/79/7974 33.3 (L)  36.0 - 46.0 % Final   MCV 01/08/2024 99.1  80.0 - 100.0 fL Final   MCH 01/08/2024 33.9  26.0 - 34.0 pg Final   MCHC 01/08/2024 34.2  30.0 - 36.0 g/dL Final   RDW 89/79/7974 14.9  11.5 - 15.5 % Final   Platelet Count 01/08/2024 170  150 - 400 K/uL Final   nRBC 01/08/2024 0.0  0.0 - 0.2 % Final   Neutrophils Relative % 01/08/2024 58  % Final   Neutro Abs 01/08/2024 2.6  1.7 - 7.7 K/uL Final   Lymphocytes Relative 01/08/2024 28  % Final   Lymphs Abs 01/08/2024 1.2  0.7 - 4.0 K/uL Final   Monocytes Relative 01/08/2024 8  % Final   Monocytes Absolute 01/08/2024 0.3  0.1 - 1.0 K/uL Final   Eosinophils Relative 01/08/2024 4  % Final   Eosinophils Absolute 01/08/2024 0.2  0.0 - 0.5 K/uL Final   Basophils Relative 01/08/2024 1  % Final   Basophils Absolute 01/08/2024 0.0  0.0 - 0.1 K/uL Final   Immature Granulocytes 01/08/2024 1  % Final   Abs Immature Granulocytes 01/08/2024 0.03  0.00 - 0.07 K/uL Final   CA 19-9 01/08/2024 12,054 (H)  0 - 35 U/mL Final   Preg, Serum 01/08/2024 NEGATIVE  NEGATIVE Final  Appointment on 12/18/2023  Component Date Value Ref Range Status   Sodium 12/18/2023 141  135 - 145 mmol/L Final   Potassium 12/18/2023 3.7  3.5 - 5.1 mmol/L Final   Chloride 12/18/2023 103  98 - 111 mmol/L Final   CO2 12/18/2023 25  22 - 32 mmol/L Final   Glucose, Bld 12/18/2023  117 (H)  70 - 99 mg/dL Final   BUN 90/70/7974 9  6 - 20 mg/dL Final   Creatinine 90/70/7974 0.69  0.44 - 1.00 mg/dL Final   Calcium  12/18/2023 10.1  8.9 - 10.3 mg/dL Final   Total Protein 90/70/7974 7.2  6.5 - 8.1 g/dL Final  Albumin 12/18/2023 4.3  3.5 - 5.0 g/dL Final   AST 90/70/7974 61 (H)  15 - 41 U/L Final   ALT 12/18/2023 60 (H)  0 - 44 U/L Final   Alkaline Phosphatase 12/18/2023 186 (H)  38 - 126 U/L Final   Total Bilirubin 12/18/2023 0.4  0.0 - 1.2 mg/dL Final   GFR, Estimated 12/18/2023 >60  >60 mL/min Final   Anion gap 12/18/2023 14  5 - 15 Final   WBC Count 12/18/2023 7.1  4.0 - 10.5 K/uL Final   RBC 12/18/2023 3.11 (L)  3.87 - 5.11 MIL/uL Final   Hemoglobin 12/18/2023 10.6 (L)  12.0 - 15.0 g/dL Final   HCT 90/70/7974 30.9 (L)  36.0 - 46.0 % Final   MCV 12/18/2023 99.4  80.0 - 100.0 fL Final   MCH 12/18/2023 34.1 (H)  26.0 - 34.0 pg Final   MCHC 12/18/2023 34.3  30.0 - 36.0 g/dL Final   RDW 90/70/7974 15.2  11.5 - 15.5 % Final   Platelet Count 12/18/2023 138 (L)  150 - 400 K/uL Final   nRBC 12/18/2023 0.0  0.0 - 0.2 % Final   Neutrophils Relative % 12/18/2023 67  % Final   Neutro Abs 12/18/2023 4.7  1.7 - 7.7 K/uL Final   Lymphocytes Relative 12/18/2023 20  % Final   Lymphs Abs 12/18/2023 1.4  0.7 - 4.0 K/uL Final   Monocytes Relative 12/18/2023 7  % Final   Monocytes Absolute 12/18/2023 0.5  0.1 - 1.0 K/uL Final   Eosinophils Relative 12/18/2023 3  % Final   Eosinophils Absolute 12/18/2023 0.2  0.0 - 0.5 K/uL Final   Basophils Relative 12/18/2023 1  % Final   Basophils Absolute 12/18/2023 0.0  0.0 - 0.1 K/uL Final   Immature Granulocytes 12/18/2023 2  % Final   Abs Immature Granulocytes 12/18/2023 0.16 (H)  0.00 - 0.07 K/uL Final   CA 19-9 12/18/2023 9,532 (H)  0 - 35 U/mL Final   Preg, Serum 12/18/2023 NEGATIVE  NEGATIVE Final  Appointment on 12/04/2023  Component Date Value Ref Range Status   Sodium 12/04/2023 141  135 - 145 mmol/L Final   Potassium 12/04/2023  4.0  3.5 - 5.1 mmol/L Final   Chloride 12/04/2023 102  98 - 111 mmol/L Final   CO2 12/04/2023 25  22 - 32 mmol/L Final   Glucose, Bld 12/04/2023 125 (H)  70 - 99 mg/dL Final   BUN 90/84/7974 10  6 - 20 mg/dL Final   Creatinine 90/84/7974 0.67  0.44 - 1.00 mg/dL Final   Calcium  12/04/2023 10.0  8.9 - 10.3 mg/dL Final   Total Protein 90/84/7974 7.0  6.5 - 8.1 g/dL Final   Albumin 90/84/7974 4.4  3.5 - 5.0 g/dL Final   AST 90/84/7974 92 (H)  15 - 41 U/L Final   ALT 12/04/2023 78 (H)  0 - 44 U/L Final   Alkaline Phosphatase 12/04/2023 214 (H)  38 - 126 U/L Final   Total Bilirubin 12/04/2023 0.3  0.0 - 1.2 mg/dL Final   GFR, Estimated 12/04/2023 >60  >60 mL/min Final   Anion gap 12/04/2023 14  5 - 15 Final   WBC Count 12/04/2023 6.6  4.0 - 10.5 K/uL Final   RBC 12/04/2023 3.15 (L)  3.87 - 5.11 MIL/uL Final   Hemoglobin 12/04/2023 10.6 (L)  12.0 - 15.0 g/dL Final   HCT 90/84/7974 31.6 (L)  36.0 - 46.0 % Final   MCV 12/04/2023 100.3 (H)  80.0 - 100.0  fL Final   MCH 12/04/2023 33.7  26.0 - 34.0 pg Final   MCHC 12/04/2023 33.5  30.0 - 36.0 g/dL Final   RDW 90/84/7974 15.5  11.5 - 15.5 % Final   Platelet Count 12/04/2023 169  150 - 400 K/uL Final   nRBC 12/04/2023 0.0  0.0 - 0.2 % Final   Neutrophils Relative % 12/04/2023 65  % Final   Neutro Abs 12/04/2023 4.3  1.7 - 7.7 K/uL Final   Lymphocytes Relative 12/04/2023 22  % Final   Lymphs Abs 12/04/2023 1.4  0.7 - 4.0 K/uL Final   Monocytes Relative 12/04/2023 8  % Final   Monocytes Absolute 12/04/2023 0.5  0.1 - 1.0 K/uL Final   Eosinophils Relative 12/04/2023 3  % Final   Eosinophils Absolute 12/04/2023 0.2  0.0 - 0.5 K/uL Final   Basophils Relative 12/04/2023 0  % Final   Basophils Absolute 12/04/2023 0.0  0.0 - 0.1 K/uL Final   Immature Granulocytes 12/04/2023 2  % Final   Abs Immature Granulocytes 12/04/2023 0.11 (H)  0.00 - 0.07 K/uL Final   Preg, Serum 12/04/2023 NEGATIVE  NEGATIVE Final   CA 19-9 12/04/2023 16,135 (H)  0 - 35 U/mL  Final  Orders Only on 11/21/2023  Component Date Value Ref Range Status   Magnesium  11/21/2023 1.9  1.7 - 2.4 mg/dL Final  Appointment on 90/97/7974  Component Date Value Ref Range Status   Sodium 11/21/2023 140  135 - 145 mmol/L Final   Potassium 11/21/2023 4.0  3.5 - 5.1 mmol/L Final   Chloride 11/21/2023 102  98 - 111 mmol/L Final   CO2 11/21/2023 24  22 - 32 mmol/L Final   Glucose, Bld 11/21/2023 117 (H)  70 - 99 mg/dL Final   BUN 90/97/7974 10  6 - 20 mg/dL Final   Creatinine 90/97/7974 0.67  0.44 - 1.00 mg/dL Final   Calcium  11/21/2023 10.3  8.9 - 10.3 mg/dL Final   Total Protein 90/97/7974 7.3  6.5 - 8.1 g/dL Final   Albumin 90/97/7974 4.4  3.5 - 5.0 g/dL Final   AST 90/97/7974 113 (H)  15 - 41 U/L Final   ALT 11/21/2023 112 (H)  0 - 44 U/L Final   Alkaline Phosphatase 11/21/2023 211 (H)  38 - 126 U/L Final   Total Bilirubin 11/21/2023 0.4  0.0 - 1.2 mg/dL Final   GFR, Estimated 11/21/2023 >60  >60 mL/min Final   Anion gap 11/21/2023 14  5 - 15 Final   WBC Count 11/21/2023 7.7  4.0 - 10.5 K/uL Final   RBC 11/21/2023 3.22 (L)  3.87 - 5.11 MIL/uL Final   Hemoglobin 11/21/2023 10.9 (L)  12.0 - 15.0 g/dL Final   HCT 90/97/7974 32.2 (L)  36.0 - 46.0 % Final   MCV 11/21/2023 100.0  80.0 - 100.0 fL Final   MCH 11/21/2023 33.9  26.0 - 34.0 pg Final   MCHC 11/21/2023 33.9  30.0 - 36.0 g/dL Final   RDW 90/97/7974 15.6 (H)  11.5 - 15.5 % Final   Platelet Count 11/21/2023 119 (L)  150 - 400 K/uL Final   nRBC 11/21/2023 0.0  0.0 - 0.2 % Final   Neutrophils Relative % 11/21/2023 65  % Final   Neutro Abs 11/21/2023 5.1  1.7 - 7.7 K/uL Final   Lymphocytes Relative 11/21/2023 23  % Final   Lymphs Abs 11/21/2023 1.8  0.7 - 4.0 K/uL Final   Monocytes Relative 11/21/2023 7  % Final   Monocytes Absolute  11/21/2023 0.6  0.1 - 1.0 K/uL Final   Eosinophils Relative 11/21/2023 2  % Final   Eosinophils Absolute 11/21/2023 0.2  0.0 - 0.5 K/uL Final   Basophils Relative 11/21/2023 1  % Final    Basophils Absolute 11/21/2023 0.0  0.0 - 0.1 K/uL Final   Immature Granulocytes 11/21/2023 2  % Final   Abs Immature Granulocytes 11/21/2023 0.12 (H)  0.00 - 0.07 K/uL Final   CA 19-9 11/21/2023 13,876 (H)  0 - 35 U/mL Final   Preg, Serum 11/21/2023 NEGATIVE  NEGATIVE Final  Appointment on 11/06/2023  Component Date Value Ref Range Status   Sodium 11/06/2023 140  135 - 145 mmol/L Final   Potassium 11/06/2023 4.0  3.5 - 5.1 mmol/L Final   Chloride 11/06/2023 102  98 - 111 mmol/L Final   CO2 11/06/2023 23  22 - 32 mmol/L Final   Glucose, Bld 11/06/2023 124 (H)  70 - 99 mg/dL Final   BUN 91/81/7974 12  6 - 20 mg/dL Final   Creatinine 91/81/7974 0.70  0.44 - 1.00 mg/dL Final   Calcium  11/06/2023 10.4 (H)  8.9 - 10.3 mg/dL Final   Total Protein 91/81/7974 7.3  6.5 - 8.1 g/dL Final   Albumin 91/81/7974 4.4  3.5 - 5.0 g/dL Final   AST 91/81/7974 99 (H)  15 - 41 U/L Final   ALT 11/06/2023 106 (H)  0 - 44 U/L Final   Alkaline Phosphatase 11/06/2023 215 (H)  38 - 126 U/L Final   Total Bilirubin 11/06/2023 0.4  0.0 - 1.2 mg/dL Final   GFR, Estimated 11/06/2023 >60  >60 mL/min Final   Anion gap 11/06/2023 15  5 - 15 Final   WBC Count 11/06/2023 6.5  4.0 - 10.5 K/uL Final   RBC 11/06/2023 3.26 (L)  3.87 - 5.11 MIL/uL Final   Hemoglobin 11/06/2023 11.1 (L)  12.0 - 15.0 g/dL Final   HCT 91/81/7974 32.7 (L)  36.0 - 46.0 % Final   MCV 11/06/2023 100.3 (H)  80.0 - 100.0 fL Final   MCH 11/06/2023 34.0  26.0 - 34.0 pg Final   MCHC 11/06/2023 33.9  30.0 - 36.0 g/dL Final   RDW 91/81/7974 15.4  11.5 - 15.5 % Final   Platelet Count 11/06/2023 152  150 - 400 K/uL Final   nRBC 11/06/2023 0.0  0.0 - 0.2 % Final   Neutrophils Relative % 11/06/2023 64  % Final   Neutro Abs 11/06/2023 4.1  1.7 - 7.7 K/uL Final   Lymphocytes Relative 11/06/2023 23  % Final   Lymphs Abs 11/06/2023 1.5  0.7 - 4.0 K/uL Final   Monocytes Relative 11/06/2023 9  % Final   Monocytes Absolute 11/06/2023 0.6  0.1 - 1.0 K/uL Final    Eosinophils Relative 11/06/2023 2  % Final   Eosinophils Absolute 11/06/2023 0.2  0.0 - 0.5 K/uL Final   Basophils Relative 11/06/2023 1  % Final   Basophils Absolute 11/06/2023 0.0  0.0 - 0.1 K/uL Final   Immature Granulocytes 11/06/2023 1  % Final   Abs Immature Granulocytes 11/06/2023 0.09 (H)  0.00 - 0.07 K/uL Final   CA 19-9 11/06/2023 14,824 (H)  0 - 35 U/mL Final   Preg, Serum 11/06/2023 NEGATIVE  NEGATIVE Final  Appointment on 10/24/2023  Component Date Value Ref Range Status   Sodium 10/24/2023 140  135 - 145 mmol/L Final   Potassium 10/24/2023 3.9  3.5 - 5.1 mmol/L Final   Chloride 10/24/2023 101  98 -  111 mmol/L Final   CO2 10/24/2023 24  22 - 32 mmol/L Final   Glucose, Bld 10/24/2023 125 (H)  70 - 99 mg/dL Final   BUN 91/94/7974 13  6 - 20 mg/dL Final   Creatinine 91/94/7974 0.77  0.44 - 1.00 mg/dL Final   Calcium  10/24/2023 10.5 (H)  8.9 - 10.3 mg/dL Final   Total Protein 91/94/7974 7.3  6.5 - 8.1 g/dL Final   Albumin 91/94/7974 4.5  3.5 - 5.0 g/dL Final   AST 91/94/7974 71 (H)  15 - 41 U/L Final   ALT 10/24/2023 72 (H)  0 - 44 U/L Final   Alkaline Phosphatase 10/24/2023 198 (H)  38 - 126 U/L Final   Total Bilirubin 10/24/2023 0.3  0.0 - 1.2 mg/dL Final   GFR, Estimated 10/24/2023 >60  >60 mL/min Final   Anion gap 10/24/2023 15  5 - 15 Final   WBC Count 10/24/2023 7.3  4.0 - 10.5 K/uL Final   RBC 10/24/2023 3.33 (L)  3.87 - 5.11 MIL/uL Final   Hemoglobin 10/24/2023 11.2 (L)  12.0 - 15.0 g/dL Final   HCT 91/94/7974 33.6 (L)  36.0 - 46.0 % Final   MCV 10/24/2023 100.9 (H)  80.0 - 100.0 fL Final   MCH 10/24/2023 33.6  26.0 - 34.0 pg Final   MCHC 10/24/2023 33.3  30.0 - 36.0 g/dL Final   RDW 91/94/7974 15.1  11.5 - 15.5 % Final   Platelet Count 10/24/2023 157  150 - 400 K/uL Final   nRBC 10/24/2023 0.0  0.0 - 0.2 % Final   Neutrophils Relative % 10/24/2023 66  % Final   Neutro Abs 10/24/2023 4.8  1.7 - 7.7 K/uL Final   Lymphocytes Relative 10/24/2023 22  % Final    Lymphs Abs 10/24/2023 1.6  0.7 - 4.0 K/uL Final   Monocytes Relative 10/24/2023 7  % Final   Monocytes Absolute 10/24/2023 0.5  0.1 - 1.0 K/uL Final   Eosinophils Relative 10/24/2023 3  % Final   Eosinophils Absolute 10/24/2023 0.2  0.0 - 0.5 K/uL Final   Basophils Relative 10/24/2023 0  % Final   Basophils Absolute 10/24/2023 0.0  0.0 - 0.1 K/uL Final   Immature Granulocytes 10/24/2023 2  % Final   Abs Immature Granulocytes 10/24/2023 0.12 (H)  0.00 - 0.07 K/uL Final   CA 19-9 10/24/2023 23,772 (H)  0 - 35 U/mL Final   Preg, Serum 10/24/2023 NEGATIVE  NEGATIVE Final  Appointment on 10/11/2023  Component Date Value Ref Range Status   Sodium 10/11/2023 141  135 - 145 mmol/L Final   Potassium 10/11/2023 4.1  3.5 - 5.1 mmol/L Final   Chloride 10/11/2023 104  98 - 111 mmol/L Final   CO2 10/11/2023 25  22 - 32 mmol/L Final   Glucose, Bld 10/11/2023 118 (H)  70 - 99 mg/dL Final   BUN 92/76/7974 11  6 - 20 mg/dL Final   Creatinine 92/76/7974 0.80  0.44 - 1.00 mg/dL Final   Calcium  10/11/2023 10.3  8.9 - 10.3 mg/dL Final   Total Protein 92/76/7974 7.4  6.5 - 8.1 g/dL Final   Albumin 92/76/7974 4.3  3.5 - 5.0 g/dL Final   AST 92/76/7974 98 (H)  15 - 41 U/L Final   ALT 10/11/2023 101 (H)  0 - 44 U/L Final   Alkaline Phosphatase 10/11/2023 185 (H)  38 - 126 U/L Final   Total Bilirubin 10/11/2023 0.3  0.0 - 1.2 mg/dL Final   GFR, Estimated 10/11/2023 >  60  >60 mL/min Final   Anion gap 10/11/2023 12  5 - 15 Final   WBC Count 10/11/2023 5.2  4.0 - 10.5 K/uL Final   RBC 10/11/2023 3.23 (L)  3.87 - 5.11 MIL/uL Final   Hemoglobin 10/11/2023 10.8 (L)  12.0 - 15.0 g/dL Final   HCT 92/76/7974 32.7 (L)  36.0 - 46.0 % Final   MCV 10/11/2023 101.2 (H)  80.0 - 100.0 fL Final   MCH 10/11/2023 33.4  26.0 - 34.0 pg Final   MCHC 10/11/2023 33.0  30.0 - 36.0 g/dL Final   RDW 92/76/7974 15.4  11.5 - 15.5 % Final   Platelet Count 10/11/2023 118 (L)  150 - 400 K/uL Final   nRBC 10/11/2023 0.0  0.0 - 0.2 % Final    Neutrophils Relative % 10/11/2023 62  % Final   Neutro Abs 10/11/2023 3.2  1.7 - 7.7 K/uL Final   Lymphocytes Relative 10/11/2023 26  % Final   Lymphs Abs 10/11/2023 1.4  0.7 - 4.0 K/uL Final   Monocytes Relative 10/11/2023 8  % Final   Monocytes Absolute 10/11/2023 0.4  0.1 - 1.0 K/uL Final   Eosinophils Relative 10/11/2023 3  % Final   Eosinophils Absolute 10/11/2023 0.2  0.0 - 0.5 K/uL Final   Basophils Relative 10/11/2023 0  % Final   Basophils Absolute 10/11/2023 0.0  0.0 - 0.1 K/uL Final   Immature Granulocytes 10/11/2023 1  % Final   Abs Immature Granulocytes 10/11/2023 0.05  0.00 - 0.07 K/uL Final   CA 19-9 10/11/2023 21,583 (H)  0 - 35 U/mL Final   Preg, Serum 10/11/2023 NEGATIVE  NEGATIVE Final  There may be more visits with results that are not included.  No image results found. CT SOFT TISSUE NECK W CONTRAST Result Date: 02/15/2024 CLINICAL DATA:  Initial evaluation for new onset left-sided head and neck pain. History of pancreatic cancer. EXAM: CT NECK WITH CONTRAST TECHNIQUE: Multidetector CT imaging of the neck was performed using the standard protocol following the bolus administration of intravenous contrast. RADIATION DOSE REDUCTION: This exam was performed according to the departmental dose-optimization program which includes automated exposure control, adjustment of the mA and/or kV according to patient size and/or use of iterative reconstruction technique. CONTRAST:  75mL OMNIPAQUE  IOHEXOL  300 MG/ML  SOLN COMPARISON:  Comparison made with prior CT of the chest, abdomen, and pelvis from 12/29/2023. FINDINGS: Pharynx and larynx: Oral cavity within normal limits. No acute abnormality about the dentition. Palatine tonsils symmetric and within normal limits. Parapharyngeal fat maintained. Oropharynx and nasopharynx within normal limits. No retropharyngeal collection or swelling. Epiglottis within normal limits. Hypopharynx and supraglottic larynx within normal limits. Glottis  symmetric and normal. Subglottic airway patent clear. Salivary glands: Salivary glands including the parotid and submandibular glands are within normal limits. Thyroid: 7 mm right thyroid nodule (series 2, image 76). This is of doubtful significance given size and patient age, with no follow-up imaging recommended (ref: J Am Coll Radiol. 2015 Feb;12(2): 143-50). Lymph nodes: No enlarged or pathologic lymph nodes seen within the neck. Vascular: Normal intravascular enhancement seen throughout the neck. Mild aortic atherosclerosis. Right-sided Port-A-Cath in place without adverse features. Limited intracranial: Unremarkable. Visualized orbits: Unremarkable. Mastoids and visualized paranasal sinuses: Moderate mucosal thickening with pneumatized secretions within the left sphenoid and right maxillary sinuses. Visualized paranasal sinuses are otherwise largely clear. Visualized mastoids and middle ear cavities are well pneumatized and free of fluid. Skeleton: No discrete or worrisome osseous lesions. Mild degenerative spondylosis present at  C4-5 through C6-7. Upper chest: Visualized upper chest demonstrates no acute finding. Visualized lungs are clear. Other: None. IMPRESSION: 1. Negative CT of the neck. No acute inflammatory changes or other abnormality. No mass lesion, adenopathy, or evidence for metastatic disease within the neck. 2. Moderate mucosal thickening with pneumatized secretions within the left sphenoid and right maxillary sinuses. Clinical correlation for possible acute sinusitis recommended. 3. Aortic Atherosclerosis (ICD10-I70.0). Electronically Signed   By: Morene Hoard M.D.   On: 02/15/2024 04:12   CT HEAD W & WO CONTRAST ( ) Result Date: 02/15/2024 CLINICAL DATA:  Initial evaluation for new onset left-sided head and neck pain. History of pancreatic cancer. EXAM: CT HEAD WITHOUT AND WITH CONTRAST TECHNIQUE: Contiguous axial images were obtained from the base of the skull through the vertex  without and with intravenous contrast. RADIATION DOSE REDUCTION: This exam was performed according to the departmental dose-optimization program which includes automated exposure control, adjustment of the mA and/or kV according to patient size and/or use of iterative reconstruction technique. CONTRAST:  75mL OMNIPAQUE  IOHEXOL  300 MG/ML  SOLN COMPARISON:  Prior MRI from 08/17/2022. FINDINGS: Brain: Cerebral volume within normal limits. No acute intracranial hemorrhage. No acute large vessel territory infarct. No mass lesion or abnormal enhancement. No evidence for intracranial metastatic disease. No mass effect or midline shift. Ventricles normal size without hydrocephalus. No extra-axial fluid collection. Vascular: No abnormal hyperdense vessel seen prior to contrast administration. Normal intravascular enhancement seen throughout the major intracranial vascular structures following contrast administration. Skull: Scalp soft tissues demonstrate no acute finding. Calvarium intact. No focal osseous lesions. Sinuses/Orbits: Globes and orbital soft tissues within normal limits. Moderate mucosal thickening with pneumatized secretions present within the left sphenoid and right maxillary sinuses. Paranasal sinuses are otherwise largely clear. Mastoid air cells and middle ear cavities are clear. Other: None. IMPRESSION: 1. Normal head CT. No acute intracranial abnormality. No evidence for intracranial metastatic disease. 2. Moderate left sphenoid and right maxillary sinus disease. Clinical correlation for possible acute sinusitis recommended. Electronically Signed   By: Morene Hoard M.D.   On: 02/15/2024 03:53   CT CHEST ABDOMEN PELVIS W CONTRAST Result Date: 01/01/2024 CLINICAL DATA:  History of pancreatic cancer, follow-up. * Tracking Code: BO * EXAM: CT CHEST, ABDOMEN, AND PELVIS WITH CONTRAST TECHNIQUE: Multidetector CT imaging of the chest, abdomen and pelvis was performed following the standard protocol  during bolus administration of intravenous contrast. RADIATION DOSE REDUCTION: This exam was performed according to the departmental dose-optimization program which includes automated exposure control, adjustment of the mA and/or kV according to patient size and/or use of iterative reconstruction technique. CONTRAST:  OMNIPAQUE  IOHEXOL  300 MG/ML  SOLN COMPARISON:  Multiple priors including CT October 19, 2023 FINDINGS: CT CHEST FINDINGS Cardiovascular: Right chest Port-A-Cath with the tip in the right atrium. Aortic atherosclerosis. Normal size heart. No significant pericardial effusion/thickening. Stable fluid density lesion along the right heart border measuring 3.3 x 2.9 cm on image 35/2 compatible with a pericardial cyst. Mediastinum/Nodes: No suspicious thyroid nodule. No pathologically enlarged mediastinal, hilar or axillary lymph nodes. The esophagus is grossly unremarkable. Lungs/Pleura: Stable 4 mm right lower lobe pulmonary nodule on image 90/3. Scattered atelectasis/scarring. No pleural effusion. No pneumothorax. No new suspicious pulmonary nodules or masses. Musculoskeletal: No aggressive lytic or blastic lesion of bone. Thoracic spondylosis. CT ABDOMEN PELVIS FINDINGS Hepatobiliary: Hepatomegaly. Background geographic fatty infiltration with bilobar wedge shaped areas of transient hepatic attenuation difference, this limits evaluation of the liver. Bilobar hepatic metastatic lesions are overall similar to  slightly decreased from prior examination. For reference: -dominant lesion in the posterior right lobe of the liver measures 3.5 cm on image 56/2 previously 3.8 cm -lesion in the central right lobe of the liver segment VIII measures 2.6 cm on image 43/2 previously 2.8 cm. No new suspicious hepatic lesions. Gallbladder is nondistended.  No biliary ductal dilation. Pancreas: Treated lesion in the pancreatic tail measures 1.9 x 1.9 cm on image 58/2 previously 2.2 x 1.9 cm. Spleen: No splenomegaly.  Adrenals/Urinary Tract: No suspicious adrenal nodule/mass. No hydronephrosis. Kidneys demonstrate symmetric enhancement. Urinary bladder is unremarkable for degree of distension. Stomach/Bowel: Stomach is unremarkable for degree of distension. No pathologic dilation of small or large bowel. Submucosal fibrofatty infiltration of the ascending colon. Mild symmetric wall thickening of nondistended ascending colon. Colonic diverticulosis. Vascular/Lymphatic: Normal caliber abdominal aorta. Smooth IVC contours. 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 No pathologically enlarged abdominal or pelvic lymph nodes. Reproductive: Uterus and bilateral adnexa are unremarkable. Other: Similar mild mesenteric and peritoneal stranding without soft tissue nodularity. Musculoskeletal: No aggressive lytic or blastic lesion of bone. Multilevel degenerative change of the spine. IMPRESSION: 1. Stable to slight decrease in size of the pancreatic tail lesion. 2. Bilobar hepatic metastatic lesions are overall similar to slightly decreased from prior examination. 3. Stable 4 mm right lower lobe pulmonary nodule. 4. No evidence of new or progressive metastatic disease in the chest, abdomen or pelvis. 5. Mild symmetric wall thickening of nondistended ascending colon, which may reflect a nonspecific colitis. 6. Geographic hepatic steatosis with wedge-shaped areas of transient hepatic attenuation difference similar prior. Electronically Signed   By: Reyes Holder M.D.   On: 01/01/2024 11:41         ASSESSMENT & PLAN   Assessment & Plan Trigeminal neuralgia Sinus pain Ear pain, left Spasm Cervical radiculopathy Chemotherapy-induced neuropathy Trigeminal neuralgia   Persistent neck and ear pain suggests trigeminal neuralgia, possibly due to nerve irritation. Differential diagnosis includes chemoneuropathy, tumor, or a pinched nerve in the neck. CT scan results are inconclusive for  sinus-related pain. Prescribed Lyrica  for nerve pain management. Ordered an MRI to evaluate for potential tumors or pinched nerves and referred to ENT for further evaluation of sinus and ear pain.  Chemotherapy-induced peripheral neuropathy   Peripheral neuropathy secondary to chemotherapy has improved after discontinuation of oxaliplatin . Continue to monitor neuropathy symptoms and adjust treatment as needed. I don't think its responsible for current ear/neck pain but its possibly side effect(s). Gastroesophageal reflux disease with esophagitis without hemorrhage  Gastroesophageal reflux disease with esophagitis   GERD with esophagitis is managed with pantoprazole , but there are insurance issues with coverage. Prescribed Nexium as an alternative due to these issues. Diabetic nephropathy associated with type 2 diabetes mellitus (HCC) Type 2 diabetes mellitus   Her diabetes is managed with insulin  glargine, but there are insurance issues with coverage. Blood glucose levels are well-controlled except during chemotherapy weeks due to steroid use. Prescribed Lantus  insulin  due to insurance coverage issues with current insulin . Continue monitoring blood glucose levels, especially during chemotherapy weeks. Anxiety Her anxiety is exacerbated by current health status and treatment regimen. Previous use of Xanax  was not well-tolerated. 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 Pancreatic adenocarcinoma [facility]) Pancreatic adenocarcinoma, stage IV with lymph node involvement   She has stage IV pancreatic adenocarcinoma with lymph node involvement and is currently undergoing chemotherapy, which has effectively shrunk tumors. She wishes to discontinue chemotherapy due  to fatigue and decreased quality of life. Discussed the potential benefits of continuing chemotherapy to prolong life and trying other treatments to improve quality  of life. Consider hospice care if chemotherapy is discontinued. Cancer associated pain  Addendum 03/04/24 see MyChart messages; situation evolved to identify pain as metastatic and patient has chosen to forgo further treatment. Would like stronger pain medication(s).  Sending morphine  15 mg bridge to hospice as initial treatment.    ORDER ASSOCIATIONS  #   DIAGNOSIS / CONDITION ICD-10 ENCOUNTER ORDER     ICD-10-CM   1. Trigeminal neuralgia  G50.0 pregabalin  (LYRICA ) 50 MG capsule    carbamazepine  (CARBATROL ) 100 MG 12 hr capsule    2. Sinus pain  J34.89 Ambulatory referral to ENT    MR CERVICAL SPINE W WO CONTRAST    MR Brain W Wo Contrast    3. Ear pain, left  H92.02 MR CERVICAL SPINE W WO CONTRAST    MR Brain W Wo Contrast    4. Gastroesophageal reflux disease with esophagitis without hemorrhage  K21.00 esomeprazole (NEXIUM) 40 MG capsule    5. Diabetic nephropathy associated with type 2 diabetes mellitus (HCC)  E11.21 insulin  glargine (LANTUS  SOLOSTAR) 100 UNIT/ML Solostar Pen    Continuous Glucose Sensor (FREESTYLE LIBRE 3 PLUS SENSOR) MISC    6. Spasm  R25.2 LORazepam  (ATIVAN ) 0.5 MG tablet    7. Anxiety  F41.9 LORazepam  (ATIVAN ) 0.5 MG tablet    8. Cervical radiculopathy  M54.12     9. Pancreatic adenocarcinoma (HCC)  C25.9     10. Chemotherapy-induced neuropathy  G62.0    T45.1X5A            Orders Placed in Encounter:   Lab Orders  No laboratory test(s) ordered today  Imaging Orders         MR CERVICAL SPINE W WO CONTRAST         MR Brain W Wo Contrast    Referral Orders         Ambulatory referral to ENT    Meds ordered this encounter  Medications   LORazepam  (ATIVAN ) 0.5 MG tablet    Sig: Take 1 tablet (0.5 mg total) by mouth 2 (two) times daily.    Dispense:  60 tablet    Refill:  1   pregabalin  (LYRICA ) 50 MG capsule    Sig: Take 1 capsule (50 mg total) by mouth 3 (three) times daily.    Dispense:  90 capsule    Refill:  1   carbamazepine   (CARBATROL ) 100 MG 12 hr capsule    Sig: Take 1 capsule (100 mg total) by mouth 2 (two) times daily.    Dispense:  60 capsule    Refill:  0   insulin  glargine (LANTUS  SOLOSTAR) 100 UNIT/ML Solostar Pen    Sig: Inject 20 Units into the skin daily. Replaces semglee     Dispense:  18 mL    Refill:  4   esomeprazole (NEXIUM) 40 MG capsule    Sig: Take 1 capsule (40 mg total) by mouth daily.    Dispense:  30 capsule    Refill:  3   Continuous Glucose Sensor (FREESTYLE LIBRE 3 PLUS SENSOR) MISC    Sig: Change sensor every 15 days.    Dispense:  2 each    Refill:  11    Orders Placed This Encounter  Procedures   MR CERVICAL SPINE W WO CONTRAST    Do Not Hold    Standing Status:  Future    Expiration Date:   02/28/2025    If indicated for the ordered procedure, I authorize the administration of contrast media per Radiology protocol:   Yes    What is the patient's sedation requirement?:   No Sedation    Does the patient have a pacemaker or implanted devices?:   No    Call Results- Best Contact Number?:   [phone]    Preferred imaging location?:   [facility] (table limit-550lbs)   MR Brain W Wo Contrast    Do Not Hold    Standing Status:   Future    Expiration Date:   02/28/2025    Scheduling Instructions:     Left ear pain, intractable, not resolving antibiotics.    If indicated for the ordered procedure, I authorize the administration of contrast media per Radiology protocol:   Yes    What is the patient's sedation requirement?:   No Sedation    Does the patient have a pacemaker or implanted devices?:   No    Call Results- Best Contact Number?:   [phone]    Preferred imaging location?:   [facility] (table limit-550lbs)   Ambulatory referral to ENT    Referral Priority:   Urgent    Referral Type:   Consultation    Referral Reason:   Specialty Services Required    Requested Specialty:   Otolaryngology    Number of Visits Requested:   1        This  document was synthesized by artificial intelligence ([employer]bridge) using HIPAA-compliant recording of the clinical interaction;   We discussed the use of AI scribe software for clinical note transcription with the patient, who gave verbal consent to proceed. additional Info: This encounter employed state-of-the-art, real-time, collaborative documentation. The patient actively reviewed and assisted in updating their electronic medical record on a shared screen, ensuring transparency and facilitating joint problem-solving for the problem list, overview, and plan. This approach promotes accurate, informed care. The treatment plan was discussed and reviewed in detail, including medication safety, potential side effects, and all patient questions. We confirmed understanding and comfort with the plan. Follow-up instructions were established, including contacting the office for any concerns, returning if symptoms worsen, persist, or new symptoms develop, and precautions for potential emergency department visits.

## 2024-02-29 NOTE — Assessment & Plan Note (Signed)
 Pancreatic adenocarcinoma, stage IV with lymph node involvement   She has stage IV pancreatic adenocarcinoma with lymph node involvement and is currently undergoing chemotherapy, which has effectively shrunk tumors. She wishes to discontinue chemotherapy due to fatigue and decreased quality of life. Discussed the potential benefits of continuing chemotherapy to prolong life and trying other treatments to improve quality of life. Consider hospice care if chemotherapy is discontinued.

## 2024-02-29 NOTE — Telephone Encounter (Signed)
 Copied from CRM #[id number]. Topic: Referral - Status >> Feb 29, 2024  2:27 PM Macario HERO wrote: Reason for CRM: Patient called regarding referral that was placed today. Advised time frame for office to reach out and call her. She said it should be done today and hung up.  Spoke with pt about referrals stated that referral lady lisa called her and told her mri is tomorrow ans is working on [facility] referral now and would call her back. I did advise her she could look on my chart under letters as well about appt she understood well

## 2024-02-29 NOTE — Patient Instructions (Addendum)
 For Spravato  Dedicated Spravato Clinics Image of [facility] 4.0 [phone]) Mental health clinic [phone]  Address: [address], [place], [place] [address] Description: This center is a trusted provider of both Spravato (esketamine) nasal spray and NeuroStar TMS therapy for depression and OCD. Most major insurance plans are accepted. Hours: Open Monday-Wednesday 7 AM-7 PM, Thursday-Friday 7 AM-7 PM. 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 Image of [facility] 4.2 [phone]) Mental health service [address]  Address: [address], [address], [place], [place] [address] Description: Provides Spravato treatment in a safe, supportive environment for patients who have tried two or more oral antidepressants without success. Phone: [phone] Image of [facility] 2.7 (111) Psychiatrist [phone] [address]   It was a pleasure seeing you today! Your health and satisfaction are our top priorities.  Bernardino Cone, MD  VISIT SUMMARY: During today's visit, we discussed your ongoing health concerns, including persistent neck pain, quality of life issues related to your pancreatic cancer treatment, and other chronic conditions. We reviewed your current medications and made some adjustments to better manage your symptoms and improve your overall well-being.  YOUR PLAN: -TRIGEMINAL NEURALGIA: Trigeminal neuralgia is a condition that causes severe facial pain due to nerve irritation. We suspect  this may be contributing to your neck and ear pain. We have prescribed Lyrica to help manage the nerve pain and ordered an MRI to check for any tumors or pinched nerves. You are also referred to an ENT specialist for further evaluation of your sinus and ear pain.  -PANCREATIC ADENOCARCINOMA, STAGE IV WITH LYMPH NODE INVOLVEMENT: You have advanced pancreatic cancer that has spread to your lymph nodes. While chemotherapy has been effective in shrinking the tumors, you are experiencing significant fatigue and a decline in quality of life. We discussed the option of discontinuing chemotherapy and considering hospice care to focus on comfort and quality of life.  -TYPE 2 DIABETES MELLITUS: Type 2 diabetes is a condition where your body does not use insulin  properly, leading to high blood sugar levels. Your diabetes is currently managed with insulin , but we have switched to Lantus  insulin  due to insurance coverage issues. Please continue to monitor your blood glucose levels, especially during chemotherapy weeks.  -GASTROESOPHAGEAL REFLUX DISEASE WITH ESOPHAGITIS: GERD is a condition where stomach acid frequently flows back into the tube connecting your mouth and stomach, causing irritation. We have switched your medication to Nexium due to insurance coverage issues with pantoprazole .  -CHEMOTHERAPY-INDUCED PERIPHERAL NEUROPATHY: Peripheral neuropathy is nerve damage that can cause pain, tingling, or numbness, often in the hands and feet. This condition has improved since you stopped taking oxaliplatin . We will continue to monitor your symptoms and adjust treatment as needed.  -ANXIETY: Your anxiety is heightened due to your current health challenges. We have prescribed Ativan to help manage your anxiety and discussed the potential use of ketamine for more severe depression and pain management.  -ACQUIRED HYPOTHYROIDISM: Hypothyroidism is a condition where your thyroid gland does not produce enough hormones,  leading to fatigue and other symptoms. Your condition is managed with levothyroxine ,  and we will continue your current regimen.  INSTRUCTIONS: Please follow up with the ENT specialist for further evaluation of your sinus and ear pain. Continue to monitor your blood glucose levels, especially during chemotherapy weeks. If you decide to discontinue chemotherapy, we can discuss hospice care options to focus on your comfort and quality of life. An MRI has been ordered to evaluate your neck pain, and we will review the results during your next visit.  Your Providers PCP: Jesus Bernardino MATSU, MD,  [phone]) Referring Provider: Jesus Bernardino MATSU, MD,  [phone]) Care Team Provider: Alveta Aleene PARAS, MD Care Team Provider: Eda Baxter, MD,  [phone]) Care Team Provider: Cloretta Arley NOVAK, MD,  [phone]) Care Team Provider: Nori Sari SQUIBB, RN  NEXT STEPS: [x]  Early Intervention: Schedule sooner appointment, call our on-call services, or go to emergency room if there is any significant Increase in pain or discomfort New or worsening symptoms Sudden or severe changes in your health [x]  Flexible Follow-Up: We recommend a No follow-ups on file. for optimal routine care. This allows for progress monitoring and treatment adjustments. [x]  Preventive Care: Schedule your annual preventive care visit! It's typically covered by insurance and helps identify potential health issues early. [x]  Lab & X-ray Appointments: Incomplete tests scheduled today, or call to schedule. X-rays: [facility] (M-F, 8:30am-noon or 1pm-5pm). [x]  Medical Information Release: Sign a release form at front desk to obtain relevant medical information we don't have.  MAKING THE MOST OF OUR FOCUSED 20 MINUTE APPOINTMENTS: [x]   Clearly state your top concerns at the beginning of the visit to focus our discussion [x]   If you anticipate you will need more time, please inform the front desk during  scheduling - we can book multiple appointments in the same week. [x]   If you have transportation problems- use our convenient video appointments or ask about transportation support. [x]   We can get down to business faster if you use MyChart to update information before the visit and submit non-urgent questions before your visit. Thank you for taking the time to provide details through MyChart.  Let our nurse know and she can import this information into your encounter documents.  Arrival and Wait Times: [x]   Arriving on time ensures that everyone receives prompt attention. [x]   Early morning (8a) and afternoon (1p) appointments tend to have shortest wait times. [x]   Unfortunately, we cannot delay appointments for late arrivals or hold slots during phone calls.  Getting Answers and Following Up [x]   Simple Questions & Concerns: For quick questions or basic follow-up after your visit, reach us  at [phone] or MyChart messaging. [x]   Complex Concerns: If your concern is more complex, scheduling an appointment might be best. Discuss this with the staff to find the most suitable option. [x]   Lab & Imaging Results: We'll contact you directly if results are abnormal or you don't use MyChart. Most normal results will be on MyChart within 2-3 business days, with a review message from Dr. Jesus. Haven't heard back in 2 weeks? Need results sooner? Contact us  at [phone]. [x]   Referrals: Our referral coordinator will manage specialist referrals. The specialist's office should contact you within 2 weeks to schedule an appointment. Call us  if you haven't heard from them after 2 weeks.  Staying Connected [x]   MyChart: Activate your MyChart for the fastest way to access results and message us . See the last page of this paperwork for instructions on how to activate.  Bring to Your Next Appointment [  x]  Medications: Please bring all your medication bottles to your next appointment to ensure we have an  accurate record of your prescriptions. [x]   Health Diaries: If you're monitoring any health conditions at home, keeping a diary of your readings can be very helpful for discussions at your next appointment.  Billing [x]   X-ray & Lab Orders: These are billed by separate companies. Contact the invoicing company directly for questions or concerns. [x]   Visit Charges: Discuss any billing inquiries with our administrative services team.  Your Satisfaction Matters [x]   Share Your Experience: We strive for your satisfaction! If you have any complaints, or preferably compliments, please let Dr. Jesus know directly or contact our Practice Administrators, Manuelita Rubin or [employer], by asking at the front desk.   Reviewing Your Records [x]   Review this early draft of your clinical encounter notes below and the final encounter summary tomorrow on MyChart after its been completed.  All orders placed so far are visible here: Trigeminal neuralgia -     Pregabalin; Take 1 capsule (50 mg total) by mouth 3 (three) times daily.  Dispense: 90 capsule; Refill: 1 -     carBAMazepine ER; Take 1 capsule (100 mg total) by mouth 2 (two) times daily.  Dispense: 60 capsule; Refill: 0  Sinus pain -     Ambulatory referral to ENT -     MR CERVICAL SPINE W WO CONTRAST; Future -     MR BRAIN W WO CONTRAST; Future  Ear pain, left -     MR CERVICAL SPINE W WO CONTRAST; Future -     MR BRAIN W WO CONTRAST; Future  Gastroesophageal reflux disease with esophagitis without hemorrhage -     Esomeprazole Magnesium ; Take 1 capsule (40 mg total) by mouth daily.  Dispense: 30 capsule; Refill: 3  Diabetic nephropathy associated with type 2 diabetes mellitus (HCC) -     Lantus  SoloStar; Inject 20 Units into the skin daily. Replaces semglee   Dispense: 18 mL; Refill: 4 -     FreeStyle Libre 3 Plus Sensor; Change sensor every 15 days.  Dispense: 2 each; Refill: 11  Spasm -     LORazepam; Take 1 tablet (0.5 mg total) by  mouth 2 (two) times daily.  Dispense: 60 tablet; Refill: 1  Anxiety -     LORazepam; Take 1 tablet (0.5 mg total) by mouth 2 (two) times daily.  Dispense: 60 tablet; Refill: 1  Cervical radiculopathy  Pancreatic adenocarcinoma (HCC)  Chemotherapy-induced neuropathy          ALLERGY MANAGEMENT PLAN  This plan is designed to help manage your allergic rhinitis (nasal allergies) effectively. Follow these steps daily for best results.  Sinus saline sprays- use nightly, and after sneezing episodes or exposure to allergen.  Insert deeply and spray mist into nose while leaning over sink at 45 degrees,  while gently breathing. Also blow out onto tissue while leaning forward 45 degrees. Once daily, after a sinus rinse, use sensimist.  Just before bedtime is best. This only needed if allergies acting up.  If this is inadequate add-on once daily for levocetirizine / xyzal  5 mg for nondrowsy antihistamine Take benadryl 25 mg at bedtime also if allergic mucus is persisting  When allergies cause chronic swelling in sinuses, it leads to sinus infections:    DAILY TREATMENT ROUTINE   Time of Day Treatment Steps  Morning 1. Saline Nasal Spray - Use to cleanse nasal passages 2. Xyzal  (levocetirizine) - Take one tablet  daily   Throughout Day Saline Nasal Spray - Use 2 additional times (mid-day and afternoon)   Evening/Bedtime 1. Saline Nasal Rinse - Thoroughly clean nasal passages 2. Flonase  Sensimist - Apply after nasal rinse 3. Benadryl (diphenhydramine) - Take 25mg  if experiencing persistent congestion    PROPER TECHNIQUE GUIDE       Saline Nasal Spray/Rinse Technique: Lean forward over sink at a 45-degree angle Turn head slightly to one side Insert spray tip into upper nostril Spray gently while breathing lightly through your nose Repeat on other side Gently blow nose to clear excess solution Use saline spray 3 times daily to keep nasal passages moist and clear allergens.        Flonase  Sensimist Technique: Shake bottle gently before each use Prime the bottle if it's new or hasn't been used for a week Tilt your head forward slightly Insert tip into nostril, pointing away from the center of your nose Spray while inhaling gently Repeat in other nostril Use Flonase  Sensimist once daily, preferably at bedtime after using saline rinse. It may take several days of regular use to feel maximum benefit.   WHY FLONASE  SENSIMIST?   Benefits of Flonase  Sensimist:  Alcohol-free and scent-free formula - gentler on sensitive nasal passages Fine mist application - more comfortable with less dripping down throat Effectively relieves nasal congestion, sneezing, runny nose, and even eye symptoms 24-hour relief with once-daily dosing Uses a more potent form of fluticasone  that works at a lower dose Less liquid per spray means less discomfort  UNDERSTANDING YOUR MEDICATIONS   Medication How It Works Important Notes  Flonase  Sensimist (fluticasone  furoate) Reduces inflammation in nasal passages, addressing the underlying cause of allergy symptoms - Takes several days for full effect - Use daily for best results - Safe for long-term use   Xyzal  (levocetirizine) Blocks histamine to reduce allergy symptoms like sneezing and itching - Take at the same time each day - May cause drowsiness in some people - Once-daily dosing   Benadryl (diphenhydramine) Antihistamine that provides additional relief for breakthrough symptoms - Causes drowsiness - Use only at bedtime - For occasional use when needed   Saline Spray/Rinse Physically removes allergens and moistens nasal passages - Safe to use frequently - Improves effectiveness of other treatments - Reduces nasal irritation    CONTACT YOUR PROVIDER IF: Your symptoms do not improve after 1-2 weeks of following this plan You develop sinus pain with fever or green/yellow discharge You experience frequent nosebleeds You develop new or  worsening symptoms You have questions about your treatment plan     ADDITIONAL ALLERGY MANAGEMENT TIPS   HELPFUL STRATEGIES: ?? Keep windows closed during high pollen seasons ??? Use allergen-proof covers for pillows and mattresses ?? Vacuum regularly with a HEPA filter vacuum ?? Shower and change clothes after spending time outdoors ?? Check local pollen counts and limit outdoor time when counts are high ?? Stay well-hydrated to help keep mucous membranes moist

## 2024-03-01 ENCOUNTER — Other Ambulatory Visit: Payer: Self-pay

## 2024-03-01 ENCOUNTER — Ambulatory Visit (HOSPITAL_COMMUNITY)
Admission: RE | Admit: 2024-03-01 | Discharge: 2024-03-01 | Disposition: A | Source: Ambulatory Visit | Attending: Internal Medicine

## 2024-03-01 ENCOUNTER — Ambulatory Visit: Payer: Self-pay | Admitting: Internal Medicine

## 2024-03-01 DIAGNOSIS — R930 Abnormal findings on diagnostic imaging of skull and head, not elsewhere classified: Secondary | ICD-10-CM | POA: Diagnosis not present

## 2024-03-01 DIAGNOSIS — J3489 Other specified disorders of nose and nasal sinuses: Secondary | ICD-10-CM

## 2024-03-01 DIAGNOSIS — C7989 Secondary malignant neoplasm of other specified sites: Secondary | ICD-10-CM

## 2024-03-01 DIAGNOSIS — H9202 Otalgia, left ear: Secondary | ICD-10-CM

## 2024-03-01 DIAGNOSIS — C259 Malignant neoplasm of pancreas, unspecified: Secondary | ICD-10-CM

## 2024-03-01 MED ORDER — GADOBUTROL 1 MMOL/ML IV SOLN
10.0000 mL | Freq: Once | INTRAVENOUS | Status: AC | PRN
  Administered 2024-03-01: 10 mL via INTRAVENOUS

## 2024-03-01 NOTE — Progress Notes (Signed)
 She intended to discontinue chemotherapy due to side effect(s) prior to this MRI that showed new mets, reaffirming her decision to discontinue chemotherapy.   ------------------------------------ Goals of Care Phone Call  Phone Call Documentation - Goals of Care Discussion  Date: 03/01/2024  Time: 650 pm  Participants: Patient, patient's husband, primary care physician  Clinical Context:   Patient is a woman with stage 4 metastatic pancreatic cancer who recently completed 22 cycles of chemotherapy. Prior to ordering recent brain MRI, patient expressed desire to discontinue chemotherapy and transition to hospice care. Patient now experiencing severe pain in neck and left ear area.  MRI was ordered to assess source of pain.  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 Patient demonstrated awareness of advanced cancer diagnosis and previously expressed preference to stop disease-directed chemotherapy.  Prognostic Discussion:   Explained that MRI findings show new areas of cancer spread to bones at base of skull and nearby muscles, which are causing the severe neck and ear pain. Discussed that these findings are consistent with disease progression despite chemotherapy.  Goals and Values Explored:   Patient reaffirmed primary goals:  - Pain control and symptom management  - Quality of remaining time with family  - Avoiding prolonged or burdensome treatments  - Preference for care focused on comfort rather than  disease-directed therapy  Treatment Options Discussed:  1. Palliative Radiation Therapy:  - Explained as brief, symptom-focused intervention (typically 1-3 treatment sessions)  - Specifically targets painful areas in skull base and soft tissue  - Expected to provide meaningful pain relief within 2-3 weeks  - Different from chemotherapy; focused entirely on symptom control  - Can be coordinated with hospice enrollment  2. Hospice Care:  - Discussed hospice as comprehensive approach focusing on comfort, symptom management, and psychosocial/spiritual support  - Emphasized that hospice and palliative radiation are complementary, not mutually exclusive options  - Addressed patient's previously stated preference for hospice enrollment  Patient/Family Preferences:   Patient and husband expressed desire to consider both palliative radiation for pain control and hospice enrollment. They requested time to discuss options together and will communicate decision via MyChart messaging.  Advance Care Planning:  - Confirmed patient has designated husband as environmental health practitioner  - Patient's values and treatment preferences documented and will be updated in medical record  - Discussed importance of ensuring all providers aware of goals of care  Plan:  1. Await patient/husband decision regarding palliative radiation oncology referral via MyChart  2. Initiate hospice referral process based on patient preference  3. Coordinate care between radiation oncology and hospice services if patient chooses both interventions  4. Continue current pain management regimen pending further interventions  5. Provided personal phone number for questions over weekend  6. No medications prescribed or sent during this call  Follow-up:   Patient and husband will message via MyChart with decision. Scheduled follow-up appointment in 10 days, but will expedite referrals based on patient's timely  response. Personal phone number provided for urgent questions or concerns over weekend.

## 2024-03-02 ENCOUNTER — Other Ambulatory Visit: Payer: Self-pay | Admitting: Oncology

## 2024-03-02 ENCOUNTER — Encounter: Payer: Self-pay | Admitting: Internal Medicine

## 2024-03-02 DIAGNOSIS — C259 Malignant neoplasm of pancreas, unspecified: Secondary | ICD-10-CM

## 2024-03-04 ENCOUNTER — Inpatient Hospital Stay: Attending: Nurse Practitioner | Admitting: Oncology

## 2024-03-04 ENCOUNTER — Inpatient Hospital Stay: Attending: Nurse Practitioner

## 2024-03-04 ENCOUNTER — Other Ambulatory Visit: Payer: Self-pay | Admitting: *Deleted

## 2024-03-04 VITALS — BP 158/75 | HR 76 | Temp 98.2°F | Resp 18 | Ht 63.0 in | Wt 234.5 lb

## 2024-03-04 DIAGNOSIS — C7951 Secondary malignant neoplasm of bone: Secondary | ICD-10-CM | POA: Diagnosis not present

## 2024-03-04 DIAGNOSIS — C252 Malignant neoplasm of tail of pancreas: Secondary | ICD-10-CM | POA: Insufficient documentation

## 2024-03-04 DIAGNOSIS — C787 Secondary malignant neoplasm of liver and intrahepatic bile duct: Secondary | ICD-10-CM | POA: Insufficient documentation

## 2024-03-04 DIAGNOSIS — T451X5A Adverse effect of antineoplastic and immunosuppressive drugs, initial encounter: Secondary | ICD-10-CM | POA: Insufficient documentation

## 2024-03-04 DIAGNOSIS — I10 Essential (primary) hypertension: Secondary | ICD-10-CM | POA: Insufficient documentation

## 2024-03-04 DIAGNOSIS — Z79899 Other long term (current) drug therapy: Secondary | ICD-10-CM | POA: Diagnosis not present

## 2024-03-04 DIAGNOSIS — E785 Hyperlipidemia, unspecified: Secondary | ICD-10-CM | POA: Diagnosis not present

## 2024-03-04 DIAGNOSIS — E119 Type 2 diabetes mellitus without complications: Secondary | ICD-10-CM | POA: Diagnosis not present

## 2024-03-04 DIAGNOSIS — C259 Malignant neoplasm of pancreas, unspecified: Secondary | ICD-10-CM

## 2024-03-04 DIAGNOSIS — G620 Drug-induced polyneuropathy: Secondary | ICD-10-CM | POA: Insufficient documentation

## 2024-03-04 DIAGNOSIS — C7989 Secondary malignant neoplasm of other specified sites: Secondary | ICD-10-CM | POA: Insufficient documentation

## 2024-03-04 LAB — CMP (CANCER CENTER ONLY)
ALT: 80 U/L — ABNORMAL HIGH (ref 0–44)
AST: 59 U/L — ABNORMAL HIGH (ref 15–41)
Albumin: 4.4 g/dL (ref 3.5–5.0)
Alkaline Phosphatase: 199 U/L — ABNORMAL HIGH (ref 38–126)
Anion gap: 11 (ref 5–15)
BUN: 12 mg/dL (ref 6–20)
CO2: 27 mmol/L (ref 22–32)
Calcium: 10.4 mg/dL — ABNORMAL HIGH (ref 8.9–10.3)
Chloride: 102 mmol/L (ref 98–111)
Creatinine: 0.6 mg/dL (ref 0.44–1.00)
GFR, Estimated: >60 mL/min (ref >60)
Glucose, Bld: 144 mg/dL — ABNORMAL HIGH (ref 70–99)
Potassium: 3.8 mmol/L (ref 3.5–5.1)
Sodium: 140 mmol/L (ref 135–145)
Total Bilirubin: 0.5 mg/dL (ref 0.0–1.2)
Total Protein: 7.6 g/dL (ref 6.5–8.1)

## 2024-03-04 LAB — CBC WITH DIFFERENTIAL (CANCER CENTER ONLY)
Abs Immature Granulocytes: 0.01 K/uL (ref 0.00–0.07)
Basophils Absolute: 0 K/uL (ref 0.0–0.1)
Basophils Relative: 1 %
Eosinophils Absolute: 0.2 K/uL (ref 0.0–0.5)
Eosinophils Relative: 4 %
HCT: 33.9 % — ABNORMAL LOW (ref 36.0–46.0)
Hemoglobin: 11.7 g/dL — ABNORMAL LOW (ref 12.0–15.0)
Immature Granulocytes: 0 %
Lymphocytes Relative: 24 %
Lymphs Abs: 1.1 K/uL (ref 0.7–4.0)
MCH: 33.2 pg (ref 26.0–34.0)
MCHC: 34.5 g/dL (ref 30.0–36.0)
MCV: 96.3 fL (ref 80.0–100.0)
Monocytes Absolute: 0.4 K/uL (ref 0.1–1.0)
Monocytes Relative: 9 %
Neutro Abs: 3 K/uL (ref 1.7–7.7)
Neutrophils Relative %: 62 %
Platelet Count: 230 K/uL (ref 150–400)
RBC: 3.52 MIL/uL — ABNORMAL LOW (ref 3.87–5.11)
RDW: 14.4 % (ref 11.5–15.5)
WBC Count: 4.9 K/uL (ref 4.0–10.5)
nRBC: 0 % (ref 0.0–0.2)

## 2024-03-04 LAB — HCG, SERUM, QUALITATIVE: Preg, Serum: NEGATIVE

## 2024-03-04 MED ORDER — OXYCODONE HCL 5 MG PO TABS: 5.0000 mg | ORAL_TABLET | ORAL | 0 refills | Status: DC | PRN

## 2024-03-04 MED ORDER — MORPHINE SULFATE 15 MG PO TABS: 15.0000 mg | ORAL_TABLET | ORAL | 0 refills | Status: AC | PRN

## 2024-03-04 NOTE — Patient Instructions (Signed)

## 2024-03-04 NOTE — Progress Notes (Signed)
 [facility] OFFICE PROGRESS NOTE   Diagnosis: Pancreas cancer  INTERVAL HISTORY:   Debbie [name] completed another cycle of chemotherapy on 02/12/2024.  She reports nausea on the day following chemotherapy.  She has increased pain at the base of the skull.  She was prescribed carbamazepine  and Lyrica  by her primary provider.  This helped partially.  She has been able to sleep better.  She continues to have numbness in the feet  Objective:  Vital signs in last 24 hours:  Blood pressure (!) 158/75, pulse 76, temperature 98.2 F (36.8 C), temperature source Temporal, resp. rate 18, height 5' 3 (1.6 m), weight 234 lb 8 oz (106.4 kg), SpO2 98%.    HEENT: White coat over the tongue, no buccal thrush, tender at the midline inferior to the occiput Resp: Lungs clear bilaterally Cardio: Regular rate and rhythm GI: No hepatosplenomegaly Vascular: No leg edema  Skin: 1.5 cm cutaneous nodule at the low right lateral chest wall  Portacath/PICC-without erythema  Lab Results:  Lab Results  Component Value Date   WBC 4.9 03/04/2024   HGB 11.7 (L) 03/04/2024   HCT 33.9 (L) 03/04/2024   MCV 96.3 03/04/2024   PLT 230 03/04/2024   NEUTROABS 3.0 03/04/2024    CMP  Lab Results  Component Value Date   NA 140 02/12/2024   K 4.0 02/12/2024   CL 101 02/12/2024   CO2 27 02/12/2024   GLUCOSE 133 (H) 02/12/2024   BUN 12 02/12/2024   CREATININE 0.70 02/12/2024   CALCIUM  10.0 02/12/2024   PROT 7.3 02/12/2024   ALBUMIN 4.4 02/12/2024   AST 89 (H) 02/12/2024   ALT 84 (H) 02/12/2024   ALKPHOS 171 (H) 02/12/2024   BILITOT 0.4 02/12/2024   GFRNONAA >60 02/12/2024   GFRAA >60 10/03/2015    Lab Results  Component Value Date   CAN199 19,450 (H) 02/12/2024   Imaging:  MR Brain W Wo Contrast Addendum Date: 03/01/2024 ADDENDUM REPORT: 03/01/2024 17:23 CLINICAL DATA:  Metastatic pancreatic cancer EXAM: MRI HEAD WITHOUT AND WITH CONTRAST TECHNIQUE: Multiplanar, multiecho pulse  sequences of the brain and surrounding structures were obtained without and with intravenous contrast. CONTRAST:  10mL GADAVIST  GADOBUTROL  1 MMOL/ML IV SOLN COMPARISON:  Brain MRI Aug 17, 2022 FINDINGS: MRI brain: The brain volume is normal. The signal in the brain parenchyma is normal. No abnormal enhancement. There is no acute or chronic infarct. The ventricles are normal. No mass lesion. There are normal flow signals in the carotid arteries and basilar artery. There is abnormal bone marrow signal in the clivus. There is a 2.8 x 4.4 x 2 cm midline suboccipital mass. This has a thick irregular rim and central necrosis. There is a 1 cm lesion involving the left pterygoid muscles near their attachment to the pterygoid plate. There is mucosal thickening in the paranasal sinuses IMPRESSION: 1. No brain metastases 2. There is abnormal bone marrow signal in the clivus which is new compared with the previous brain MRI from Aug 17, 2022 consistent with skeletal metastatic disease 3. 2.8 x 4.4 x 2 cm midline necrotic suboccipital mass most likely tumor. 4. 1 cm metastatic lesion involving the left pterygoid muscles near their attachment to the pterygoid plate. Electronically Signed   By: Nancyann Burns M.D.   On: 03/01/2024 17:23   Result Date: 03/01/2024 CLINICAL DATA:  Metastatic pancreatic cancer EXAM: MRI HEAD WITHOUT AND WITH CONTRAST TECHNIQUE: Multiplanar, multiecho pulse sequences of the brain and surrounding structures were obtained without and with  intravenous contrast. CONTRAST:  10mL GADAVIST  GADOBUTROL  1 MMOL/ML IV SOLN COMPARISON:  Brain MRI Aug 17, 2022 FINDINGS: MRI brain: The brain volume is normal. The signal in the brain parenchyma is normal. No abnormal enhancement. There is no acute or chronic infarct. The ventricles are normal. No mass lesion. There are normal flow signals in the carotid arteries and basilar artery. There is abnormal bone marrow signal in the clivus. There is a 2.8 x 4.4 x 2 cm  midline suboccipital mass. This has a thick irregular rim and central necrosis. There is mucosal thickening in the paranasal sinuses IMPRESSION: 1. No brain metastases 2. There is abnormal bone marrow signal in the clivus which is new compared with the previous brain MRI from Aug 17, 2022 consistent with skeletal metastatic disease 3. 2.8 x 4.4 x 2 cm midline necrotic suboccipital mass most likely tumor. Electronically Signed: By: Nancyann Burns M.D. On: 03/01/2024 17:15   MR CERVICAL SPINE W WO CONTRAST Result Date: 03/01/2024 CLINICAL DATA:  Metastatic pancreatic cancer, cervical radiculopathy EXAM: MRI CERVICAL SPINE WITHOUT AND WITH CONTRAST TECHNIQUE: Multiplanar and multiecho pulse sequences of the cervical spine, to include the craniocervical junction and cervicothoracic junction, were obtained without and with intravenous contrast. CONTRAST:  10mL GADAVIST  GADOBUTROL  1 MMOL/ML IV SOLN COMPARISON:  CT 02/14/2024 FINDINGS: There is necrotic appearing midline suboccipital mass as described on the brain MRI. No cervical spine bone lesions are identified. The cervical spinal cord is normal. C2-C3: Normal C3-C4: Normal C4-C5: There is mild degenerative disc disease. Small central disc herniation with mild spinal stenosis C5-C6: There is mild degenerative disc disease. Small central disc herniation with mild spinal stenosis C6-C7: There is mild degenerative disc disease. Small central disc herniation. No significant spinal stenosis C7-T1: Normal IMPRESSION: 1. Necrotic appearing suboccipital mass as described on the brain MRI 2. No cervical metastases identified 3. Mild degenerative changes Electronically Signed   By: Nancyann Burns M.D.   On: 03/01/2024 17:19    Medications: I have reviewed the patient's current medications.   Assessment/Plan: Pancreas cancer CT abdomen/pelvis 03/24/2023: 3 x 4.6 cm pancreas tail mass, multiple liver lesions consistent with metastases, fat stranding surrounding the second  and third ports of the duodenum and pancreas head/uncinate  CT chest 03/27/2023: New spiculated nodular density in the right lower lobe, 8 x 4 mm EUS 03/27/2023: Pancreas tail mass, liver lesions, T2 N0 M1, needle biopsy of pancreas mass, adenocarcinoma Foundation 1: K-ras G 12R, HRD signature negative, MSS, tumor mutation burden 0, PD-L1 TPS 1% Elevated CA 19-9 (121,720 on 03/26/2023) Cycle 1 FOLFIRINOX 04/10/2023 Chemotherapy held 04/24/2023 due to neutropenia Cycle 2 FOLFIRINOX 05/04/2023, Udenyca  Cycle 3 FOLFIRINOX 05/22/2023, Udenyca  Cycle 4 FOLFIRINOX 06/05/2023, Udenyca  Cycle 5 FOLFIRINOX 06/19/2023, Udenyca  CTs 06/26/2023: Stable pancreas mass, slight increase in size and number of hepatic metastases, improved ill-defined right lower lobe nodule, new nonspecific small groundglass nodules, improved stranding at the pancreas head/neck (review of CTs with patient-pancreas mass appears slightly smaller, several liver lesions are slightly larger others measured stable or slightly smaller Cycle 6 FOLFIRINOX 07/04/2023, Udenyca  Cycle 7 FOLFIRINOX 07/17/2023, Udenyca ; oxaliplatin  held due to neuropathy and thrombocytopenia Cycle 8 FOLFIRINOX 07/31/2023, Udenyca ; oxaliplatin  held due to neuropathy, irinotecan  dose reduced due to elevated AST/ALT. CTs 08/09/2023-improvement in hepatic metastases.  Decrease in size of pancreatic tail mass.  No evidence of metastatic disease within the chest. Cycle 9 FOLFIRINOX 08/16/2023, Udenyca ; oxaliplatin  held due to neuropathy Cycle 10 FOLFIRINOX 08/29/2023, Udenyca , oxaliplatin  held due to neuropathy Cycle 11 FOLFIRINOX  09/11/2023, Udenyca , oxaliplatin  held due to neuropathy Cycle 12 FOLFIRINOX 09/25/2023, Udenyca , oxaliplatin  held due to neuropathy Cycle 13 FOLFIRINOX 10/11/2023, Udenyca , oxaliplatin  held due to neuropathy  CTs 10/19/2023: Decrease in the pancreas tail mass, stable to slightly smaller hepatic lesions, no new lesions, 4 mm right lower lobe nodule present on 2 previous  CTs Cycle 14 FOLFIRINOX 10/24/2023, Udenyca , oxaliplatin  held due to neuropathy Cycle 15 FOLFIRINOX 11/06/2023, Udenyca , oxaliplatin  held due to neuropathy Cycle 16 FOLFIRINOX 11/21/2023, Udenyca , oxaliplatin  held due to neuropathy Cycle 17 FOLFIRINOX 12/04/2023, Udenyca , oxaliplatin  held due to neuropathy Cycle 18 FOLFIRINOX 12/18/2023, Udenyca , oxaliplatin  held due to neuropathy CTs 12/29/2023-stable to slight decrease size of pancreatic tail lesion.  Bilobar hepatic metastatic lesions overall similar to slightly decreased.  Stable 4 mm right lower lobe pulmonary nodule. Cycle 19 FOLFIRINOX 01/08/2024, Udenyca , oxaliplatin  held due to neuropathy Cycle 20 FOLFIRINOX 01/22/2024, Udenyca , oxaliplatin  held due to neuropathy Cycle 21 FOLFIRINOX 02/12/2024, Udenyca , oxaliplatin  held due to neuropathy 03/01/2024 MRI brain: Abnormal bone marrow signal in the clivus, midline suboccipital mass with an irregular rim and central necrosis, 1 cm left pterygoid muscle metastasis   2.  Diabetes 3.  Acute abdominal pain and nausea/vomiting 03/23/2023 secondary to pancreatitis.  Improved 03/31/2023 4.  Hypertension. 5.  Asthma 6.  Hypothyroidism 7.  Hyperlipidemia 8.  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  She has developed pain inferior to the occiput.  A brain MRI last week confirmed metastatic disease involving a suboccipital mass, the clivus, and a left pterygoid muscle lesion.  I discussed treatment options with Debbie [name] and her husband.  We discussed palliative radiation to the suboccipital mass.  We discussed second line systemic therapy with gemcitabine/Abraxane.  We discussed resuming oxaliplatin .  She understands the goals of treatment are to palliate symptoms and potentially extend survival.  Debbie [name] has decided against further treatment of the  cancer.  She will enroll in hospice care.  We discussed CPR and ACLS.  She will be placed on a no CODE BLUE status.  I refilled her prescription for oxycodone .  She will return for an office visit in 3 weeks.  We are available to see her sooner as needed.  She will let us  know if she changes her decision on treatment of the cancer. Arley Hof, MD  03/04/2024  8:32 AM

## 2024-03-04 NOTE — Addendum Note (Signed)
 Addended by: Hester Joslin G on: 03/04/2024 09:43 PM   Modules accepted: Orders

## 2024-03-06 ENCOUNTER — Inpatient Hospital Stay

## 2024-03-06 ENCOUNTER — Other Ambulatory Visit (HOSPITAL_COMMUNITY): Payer: Self-pay

## 2024-03-06 ENCOUNTER — Encounter: Payer: Self-pay | Admitting: *Deleted

## 2024-03-06 ENCOUNTER — Telehealth: Payer: Self-pay

## 2024-03-06 NOTE — Progress Notes (Signed)
 Received fax from covermymeds re: PA for oxycodone  5 mg tablet. This was faxed to prior authorization team.

## 2024-03-06 NOTE — Telephone Encounter (Signed)
 Pharmacy Patient Advocate Encounter   Received notification from Onbase that prior authorization for Morphine  Sulfate 15MG  tabletsis required/requested.   Insurance verification completed.   The patient is insured through [facility].   Per test claim: PA required; PA submitted to above mentioned insurance via Latent Key/confirmation #/EOC [id number] Status is pending

## 2024-03-07 ENCOUNTER — Other Ambulatory Visit (HOSPITAL_COMMUNITY): Payer: Self-pay

## 2024-03-07 NOTE — Telephone Encounter (Signed)
 Pharmacy Patient Advocate Encounter  Received notification from [facility] that Prior Authorization for Morphine  Sulfate 15MG  tablets has been APPROVED from 03/06/24 to 03/06/25   PA #/Case ID/Reference #: [id number]

## 2024-03-11 ENCOUNTER — Telehealth: Payer: Self-pay

## 2024-03-11 ENCOUNTER — Ambulatory Visit: Admitting: Internal Medicine

## 2024-03-11 NOTE — Telephone Encounter (Signed)
 Received confirmation of successful fax transmission of signed orders.

## 2024-03-18 ENCOUNTER — Other Ambulatory Visit: Payer: Self-pay | Admitting: Internal Medicine

## 2024-03-18 DIAGNOSIS — E039 Hypothyroidism, unspecified: Secondary | ICD-10-CM

## 2024-03-23 ENCOUNTER — Other Ambulatory Visit: Payer: Self-pay | Admitting: Oncology

## 2024-03-23 DIAGNOSIS — C259 Malignant neoplasm of pancreas, unspecified: Secondary | ICD-10-CM

## 2024-03-25 ENCOUNTER — Inpatient Hospital Stay

## 2024-03-25 ENCOUNTER — Inpatient Hospital Stay: Admitting: Nurse Practitioner

## 2024-03-25 NOTE — Telephone Encounter (Signed)
 Discuss need w/NP

## 2024-03-26 ENCOUNTER — Telehealth: Payer: Self-pay | Admitting: *Deleted

## 2024-03-26 NOTE — Telephone Encounter (Signed)
 Called Debbie [name] to inform her that she does not need to continue her Mg+ (refill requested) or her KCl (has been out). She does wish to keep her 1/13 visit and asked to cancel 1/26 appointments. Has been having pain, but Hospice is working on this for her now.

## 2024-03-27 ENCOUNTER — Inpatient Hospital Stay

## 2024-03-28 ENCOUNTER — Ambulatory Visit (INDEPENDENT_AMBULATORY_CARE_PROVIDER_SITE_OTHER): Admitting: Otolaryngology

## 2024-03-28 ENCOUNTER — Encounter (INDEPENDENT_AMBULATORY_CARE_PROVIDER_SITE_OTHER): Payer: Self-pay | Admitting: Otolaryngology

## 2024-03-28 VITALS — BP 163/106 | HR 81 | Ht 63.0 in | Wt 225.0 lb

## 2024-03-28 DIAGNOSIS — C259 Malignant neoplasm of pancreas, unspecified: Secondary | ICD-10-CM

## 2024-03-28 DIAGNOSIS — J328 Other chronic sinusitis: Secondary | ICD-10-CM

## 2024-03-28 DIAGNOSIS — J3089 Other allergic rhinitis: Secondary | ICD-10-CM | POA: Diagnosis not present

## 2024-03-28 DIAGNOSIS — H9202 Otalgia, left ear: Secondary | ICD-10-CM | POA: Diagnosis not present

## 2024-03-28 DIAGNOSIS — J343 Hypertrophy of nasal turbinates: Secondary | ICD-10-CM | POA: Diagnosis not present

## 2024-03-28 DIAGNOSIS — R0981 Nasal congestion: Secondary | ICD-10-CM

## 2024-03-28 MED ORDER — PREDNISONE 20 MG PO TABS: ORAL_TABLET | ORAL | 0 refills | Status: AC

## 2024-03-28 NOTE — Patient Instructions (Signed)
 Prednisone  take 20mg  once daily for 5 days, then 10 mg daily for 5 days, then stop  Aureliano Med Nasal Saline Rinse - start nasal saline rinses with NeilMed Bottle available over the counter    Nasal Saline Irrigation instructions: If you choose to make your own salt water solution, You will need: Salt (kosher, canning, or pickling salt) Baking soda Nasal irrigation bottle (i.e. Aureliano Med Sinus Rinse) Measuring spoon ( teaspoon) Distilled / boiled water   Mix solution Mix 1 teaspoon of salt, 1/2 teaspoon of baking soda and 1 cup of water into irrigation bottle ** May use saline packet instead of homemade recipe for this step if you prefer If medicine was prescribed to be mixed with solution, place this into bottle Examples 2 inches of 2% mupirocin ointment Budesonide solution Position your head: Lean over sink (about 45 degrees) Rotate head (about 45 degrees) so that one nostril is above the other Irrigate Insert tip of irrigation bottle into upper nostril so it forms a comfortable seal Irrigate while breathing through your mouth May remove the straw from the bottle in order to irrigate the entire solution (important if medicine was added) Exhale through nose when finished and blow nose as necessary  Repeat on opposite side with other 1/2 of solution (120 mL) or remake solution if all 240 mL was used on first side Wash irrigation bottle regularly, replace every 3 months

## 2024-03-28 NOTE — Progress Notes (Signed)
 Dear Dr. Jesus, Here is my assessment for our mutual patient, Debbie [name]. Thank you for allowing me the opportunity to care for your patient. Please do not hesitate to contact me should you have any other questions. Sincerely, Dr. Eldora Blanch  Otolaryngology Clinic Note  HISTORY:  Initial visit (03/2024): Discussed the use of AI scribe software for clinical note transcription with the patient, who gave verbal consent to proceed.  History of Present Illness Debbie [name] is a 54 year old female with metastatic pancreatic cancer who presents with persistent left ear pain and nasal congestion  She developed constant left ear pain several months ago, present day and night without hearing loss or otorrhea. The pain involves the left ear and adjacent facial regions. She is unsure if it is infectious or related to her malignancy. MRI showing tumor focus in the left pterygoid muscle with additional lesions in the occiput.  She has h/o sinus infections with sx including facial pain and pressure behind the eyes, over the cheeks, forehead, and between the eyes and discolored drainage. Abx/steroids do help these and she is not having any of these symptoms but just congestion currently. She has never had sinus surgery.  She has allergic rhinitis with persistent nasal congestion. She currently uses Singulair , azelastine  and fluticasone  nasal sprays, Zyrtec. She follows with an allergist and is not on immunotherapy. She uses CPAP nightly for sleep apnea. She does not use tobacco or anticoagulation.  Allergy testing has been done -- sees Dr. Altamease.  No previous sinonasal surgery.  AP/AC: no  Tobacco: no  PMHx: Pancreatic Adenocarcinoma, DM, GERD, GAD, OSA on CPAP  RADIOGRAPHIC EVALUATION AND INDEPENDENT REVIEW OF OTHER RECORDS:: Dr. Jesus (02/29/2024): sinus pain and neck pain as well as left ear pain; has tried abx/steroids which have not helped; tried flexeril  and tramadol  and nasonex, not  much help; trigeminal neuralgia; Rx: Lyrica , MRI, ref to ENT Dr. Cloretta (03/04/2024): appears to have had 21 cycles of FOLFIRINOX, undenyca (oxyliplatin prior, held due to neuropathy); pain below occiput, metastasis inferior to occiput with mass involving clivus and pterygoid CBC and CMP 03/04/2024: BUN/Cr 12/0.6; WBC 4.9, Hb 11.7 MRI 03/01/2024 independently interpreted: agree with read -- 4x3 cm suboccipital mass and left pterygoid lesion; some mucosal thickening right max, left sphenoid and ethmoid; mastoids clear CT and Head 02/14/2024 also reviewed -- noted occipital lesion likely seen on CT neck; otherwise paranasal sinuses as MRI; mastoids and ME well aerated  Past Medical History:  Diagnosis Date   Acute renal failure (ARF) 10/01/2015   Allergy    Depression    DKA, type 2 (HCC) 03/01/2023   Elevated cholesterol    History of endometrial ablation 05/20/2021   2021   Hyperlipidemia    Hypertension    Hypertriglyceridemia 02/08/2022   No components found for: TG Lab Results Component Value Date/Time  TRIG 125.0 08/18/2021 08:53 AM  TRIG 163.0 (H) 12/29/2020 09:20 AM  TRIG 148.0 08/04/2020 08:24 AM  TRIG 177.0 (H) 04/03/2019 07:30 AM     Hypothyroidism    Insomnia 11/08/2021   Morbid obesity (HCC) 02/08/2019   New onset type 2 diabetes mellitus (HCC) 02/23/2023   Type 2 Diabetes Mellitus (E11.9) Onset: 02/23/2023 Key Events:  Initial A1c: 11.1% Presenting with polydipsia Current Treatment: Initiating Metformin  and Mounjaro  Monitoring: Blood glucose monitoring TID Follow-up in 2 weeks Quality Metrics: Eye exam needed Diabetes education pending Tags: #NewProblem #RequiresFollowUp #PatientEducation     Seasonal allergies    Sleep apnea  wears cpap    Thyroid disease    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 Fleeta Smock    Past Surgical History:  Procedure Laterality Date   ABLATION  10/2019   ADENOIDECTOMY      BIOPSY  03/27/2023   Procedure: BIOPSY;  Surgeon: Wilhelmenia Aloha Raddle., MD;  Location: [facility] ENDOSCOPY;  Service: Gastroenterology;;   COLONOSCOPY     DENTAL SURGERY     graft of mouth    ESOPHAGOGASTRODUODENOSCOPY (EGD) WITH PROPOFOL  N/A 03/27/2023   Procedure: ESOPHAGOGASTRODUODENOSCOPY (EGD) WITH PROPOFOL ;  Surgeon: Wilhelmenia Aloha Raddle., MD;  Location: [facility] ENDOSCOPY;  Service: Gastroenterology;  Laterality: N/A;   EUS  03/27/2023   Procedure: UPPER ENDOSCOPIC ULTRASOUND (EUS) LINEAR;  Surgeon: Wilhelmenia Aloha Raddle., MD;  Location: [facility] ENDOSCOPY;  Service: Gastroenterology;;   FINE NEEDLE ASPIRATION  03/27/2023   Procedure: FINE NEEDLE ASPIRATION (FNA) LINEAR;  Surgeon: Wilhelmenia Aloha Raddle., MD;  Location: [facility] ENDOSCOPY;  Service: Gastroenterology;;   IR IMAGING GUIDED PORT INSERTION  04/03/2023   Family History  Problem Relation Age of Onset   Hypertension Mother    Hyperlipidemia Mother    Heart disease Father    Cancer Maternal Grandmother 73 - 64       metastatic, unknown priamry   Prostate cancer Maternal Grandfather 49 - 79   Diabetes Maternal Grandfather    CAD Maternal Grandfather    Breast cancer Maternal Great-grandmother 55 - 89   Colon cancer Neg Hx    Colon polyps Neg Hx    Esophageal cancer Neg Hx    Stomach cancer Neg Hx    Rectal cancer Neg Hx    Social History   Tobacco Use   Smoking status: Never   Smokeless tobacco: Never  Substance Use Topics   Alcohol use: No   Allergies[1] Current Outpatient Medications  Medication Sig Dispense Refill   Azelastine -Fluticasone  137-50 MCG/ACT SUSP PLACE 1 SPRAY INTO THE NOSE EVERY 12 (TWELVE) HOURS. 23 g 1   BD PEN NEEDLE NANO 2ND GEN 32G X 4 MM MISC USE AS DIRECTED 4 TIMES A DAY 300 each 0   Blood Glucose Monitoring Suppl DEVI 1 each by Does not apply route in the morning, at noon, and at bedtime. May substitute to any manufacturer covered by patient's insurance. 1 each 0   carbamazepine  (CARBATROL ) 100 MG 12 hr  capsule Take 1 capsule (100 mg total) by mouth 2 (two) times daily. 60 capsule 0   Continuous Glucose Sensor (DEXCOM G7 SENSOR) MISC 1 Act by Does not apply route daily. 1 each 11   Continuous Glucose Sensor (FREESTYLE LIBRE 3 PLUS SENSOR) MISC Change sensor every 15 days. 2 each 11   cyclobenzaprine  (FLEXERIL ) 5 MG tablet Take 1 tablet (5 mg total) by mouth 3 (three) times daily as needed for muscle spasms. 30 tablet 0   escitalopram  (LEXAPRO ) 20 MG tablet TAKE 1 TABLET BY MOUTH EVERY DAY 90 tablet 3   esomeprazole (NEXIUM) 40 MG capsule Take 1 capsule (40 mg total) by mouth daily. 30 capsule 3   insulin  glargine (LANTUS  SOLOSTAR) 100 UNIT/ML Solostar Pen Inject 20 Units into the skin daily. Replaces semglee  18 mL 4   insulin  glargine-yfgn (SEMGLEE ) 100 UNIT/ML Pen SMARTSIG:20 Unit(s) SUB-Q Daily     levocetirizine (XYZAL ) 5 MG tablet TAKE 1 TABLET BY MOUTH EVERY DAY IN THE EVENING 90 tablet 3   levothyroxine  (SYNTHROID ) 75 MCG tablet TAKE 1 TABLET BY MOUTH EVERY DAY 90 tablet  1   lidocaine -prilocaine  (EMLA ) cream Apply 1 Application topically as needed (Apply to port 1-2 hours prior to use). 30 g 0   LORazepam  (ATIVAN ) 0.5 MG tablet Take 1 tablet (0.5 mg total) by mouth 2 (two) times daily. 60 tablet 1   losartan -hydrochlorothiazide  (HYZAAR) 50-12.5 MG tablet Take 0.5 tablets by mouth daily. Ok to start with just half tablet daily for week 1 and leave it there if goal of 140/90 reached. 90 tablet 4   magnesium  oxide (MAG-OX) 400 MG tablet TAKE 1 TABLET BY MOUTH TWICE A DAY 60 tablet 3   metFORMIN  (GLUCOPHAGE ) 500 MG tablet Take 1 tablet (500 mg total) by mouth 2 (two) times daily with a meal. 180 tablet 3   methadone (DOLOPHINE) 5 MG tablet Take 2.5 mg by mouth 3 (three) times daily.     montelukast  (SINGULAIR ) 10 MG tablet Take 1 tablet by mouth every evening.  5   morphine  (MSIR) 15 MG tablet Take 1 tablet (15 mg total) by mouth every 4 (four) hours as needed for severe pain (pain score 7-10).  60 tablet 0   oxyCODONE  (OXY IR/ROXICODONE ) 5 MG immediate release tablet Take by mouth.     potassium chloride  SA (KLOR-CON  M) 20 MEQ tablet TAKE 1 TABLET BY MOUTH EVERY DAY 90 tablet 0   predniSONE  (DELTASONE ) 20 MG tablet Take 1 tablet (20 mg total) by mouth daily with breakfast for 5 days, THEN 0.5 tablets (10 mg total) daily with breakfast for 5 days. 7.5 tablet 0   pregabalin  (LYRICA ) 50 MG capsule Take 1 capsule (50 mg total) by mouth 3 (three) times daily. 90 capsule 1   prochlorperazine  (COMPAZINE ) 10 MG tablet Take 1 tablet (10 mg total) by mouth every 6 (six) hours as needed for nausea or vomiting. 30 tablet 3   amoxicillin -clavulanate (AUGMENTIN ) 875-125 MG tablet Take 1 tablet by mouth 2 (two) times daily. (Patient not taking: Reported on 03/28/2024)     Glucagon  (GVOKE HYPOPEN  2-PACK) 0.5 MG/0.1ML SOAJ Inject 0.5 mg into the skin daily as needed (for low blood sugar). (Patient not taking: Reported on 03/28/2024) 0.2 mL 1   lipase/protease/amylase (CREON ) 36000 UNITS CPEP capsule Take 2 capsules (72,000 Units total) by mouth 3 (three) times daily before meals. (Patient not taking: Reported on 03/28/2024) 180 capsule 2   loperamide (IMODIUM) 2 MG capsule Take 2-4 mg by mouth as needed for diarrhea or loose stools. (Patient not taking: Reported on 03/28/2024)     ondansetron  (ZOFRAN -ODT) 8 MG disintegrating tablet Take 1 tablet (8 mg total) by mouth every 8 (eight) hours as needed for nausea or vomiting. (Patient not taking: Reported on 03/28/2024) 30 tablet 2   No current facility-administered medications for this visit.   BP (!) 163/106 (BP Location: Left Arm, Patient Position: Sitting, Cuff Size: Large)   Pulse 81   Ht 5' 3 (1.6 m)   Wt 225 lb (102.1 kg)   SpO2 94%   BMI 39.86 kg/m   PHYSICAL EXAM:  BP (!) 163/106 (BP Location: Left Arm, Patient Position: Sitting, Cuff Size: Large)   Pulse 81   Ht 5' 3 (1.6 m)   Wt 225 lb (102.1 kg)   SpO2 94%   BMI 39.86 kg/m    Salient  findings:  CN II-XII intact Bilateral EAC clear and TM intact with well pneumatized middle ear spaces Nose: Anterior rhinoscopy reveals septum mild dev, bilateral IT hypertrophy Nasal endoscopy was indicated to better evaluate the nose and paranasal sinuses, given  the patient's history and exam findings, and is detailed below. No lesions of oral cavity/oropharynx; palate elevates midline, sensation intact; no trismus No obviously palpable neck masses/lymphadenopathy/thyromegaly No respiratory distress or stridor   PROCEDURE:  Prior to initiating any procedures, risks/benefits/alternatives were explained to the patient and verbal consent obtained. Diagnostic Nasal Endoscopy Pre-procedure diagnosis: Concern for chronic sinusitis, nasal congesiton Post-procedure diagnosis: same Indication: See pre-procedure diagnosis and physical exam above Complications: None apparent EBL: 0 mL Anesthesia: Lidocaine  4% and topical decongestant was topically sprayed in each nasal cavity  Description of Procedure:  Patient was identified. A rigid 30 degree endoscope was utilized to evaluate the sinonasal cavities, mucosa, sinus ostia and turbinates and septum.  Overall, signs of mucosal inflammation are mild but mild mucosal inflammation noted.  No mucopurulence, polyps, or masses noted.   Right Middle meatus: clear Right SE Recess: clear Left MM: clear Left SE Recess: clear  CPT CODE -- 31231 - Mod 25   ASSESSMENT:  54 y.o. with:  1. Referred otalgia of left ear   2. Pancreatic adenocarcinoma (HCC)   3. Other chronic sinusitis   4. Nasal congestion   5. Hypertrophy of both inferior nasal turbinates   6. Other allergic rhinitis    Given mets, suspect that her otalgia is referred. We discussed overall reassuring exam from ear standpoint and prior imaging. She understands this  From nasal standpoint, not having CRS sx except congestion. Endo overall reassuring; does have h/o allergies; not fully  medically managed. Given her current status, we discussed options and makes most sense to do medical management which she agrees with:  - Daily nasal rinses - Continue BID INCS-Astelin  - Will do short course of pred taper in case some inflammatory pterygoid component as well as for sinuses - Can do warm compress over ear/TMJ area - d/w pt f/u, she opted PRN  See below regarding exact medications prescribed this encounter including dosages and route: Meds ordered this encounter  Medications   predniSONE  (DELTASONE ) 20 MG tablet    Sig: Take 1 tablet (20 mg total) by mouth daily with breakfast for 5 days, THEN 0.5 tablets (10 mg total) daily with breakfast for 5 days.    Dispense:  7.5 tablet    Refill:  0     Thank you for allowing me the opportunity to care for your patient. Please do not hesitate to contact me should you have any other questions.  Sincerely, Eldora Blanch, MD Otolaryngologist (ENT), [facility] Phone: [phone] Fax: [phone]  MDM:  I have personally spent 65 minutes involved in face-to-face and non-face-to-face activities for this patient on the day of the visit.  Professional time spent excludes any procedures performed but includes the following activities, in addition to those noted in the documentation: preparing to see the patient (review of outside documentation and results), performing a medically appropriate examination, counseling, ordering medications (Prednisone ), documenting in the electronic health record, independently interpreting results (2 CT scans and a MRI).   03/28/2024, 1:26 PM      [1]  Allergies Allergen Reactions   Atorvastatin Hives

## 2024-04-02 ENCOUNTER — Other Ambulatory Visit: Payer: Self-pay | Admitting: *Deleted

## 2024-04-02 ENCOUNTER — Inpatient Hospital Stay: Attending: Nurse Practitioner | Admitting: Nurse Practitioner

## 2024-04-02 ENCOUNTER — Encounter: Payer: Self-pay | Admitting: Nurse Practitioner

## 2024-04-02 ENCOUNTER — Inpatient Hospital Stay

## 2024-04-02 ENCOUNTER — Ambulatory Visit: Admitting: Internal Medicine

## 2024-04-02 VITALS — BP 159/92 | HR 82 | Temp 97.8°F | Resp 18 | Ht 63.0 in | Wt 233.1 lb

## 2024-04-02 DIAGNOSIS — C259 Malignant neoplasm of pancreas, unspecified: Secondary | ICD-10-CM

## 2024-04-02 NOTE — Progress Notes (Signed)
 " Radiation Oncology         [phone] ________________________________  Name: Debbie [name]        MRN: [id number]  Date of Service: 04/03/2024 DOB: 10-05-1970  RR:Dyzmmpoo, Arley NOVAK, MD  Cloretta Arley NOVAK, MD     REFERRING PHYSICIAN: Cloretta Arley NOVAK, MD   DIAGNOSIS: There were no encounter diagnoses.   HISTORY OF PRESENT ILLNESS: Debbie [name] is a 54 y.o. female seen at the request of Dr. Cloretta for a diagnosis of metastatic pancreatic cancer. The patient was originally diagnosed with her cancer involving the pancreas, liver and lung in January 2025. She began FOLFIRINOX on 04/10/23 and had a partial response after 5 cycles, but completed 21 cycles in November 2025 with oxaliplatin  held due to neuropathy since cycle 7. She had progression noted in an MRI brain scan on 03/01/24 with marrow changes in the clivus, a suboccipital mass measuring  4.4 cm, and a 1 cm lesion involving the left pterygoid muscles near their attachment to the pyerygoid plate. She has been in home hospice care since a referral mid December, and is seen to consider palliative radiotherapy due to the pian her disease is causing in her left ear region and lateral face on the left side. Her hospice agency has approved ***    PREVIOUS RADIATION THERAPY: {EXAM; YES/NO:19492::No}   PAST MEDICAL HISTORY:  Past Medical History:  Diagnosis Date   Acute renal failure (ARF) 10/01/2015   Allergy    Depression    DKA, type 2 (HCC) 03/01/2023   Elevated cholesterol    History of endometrial ablation 05/20/2021   2021   Hyperlipidemia    Hypertension    Hypertriglyceridemia 02/08/2022   No components found for: TG Lab Results Component Value Date/Time  TRIG 125.0 08/18/2021 08:53 AM  TRIG 163.0 (H) 12/29/2020 09:20 AM  TRIG 148.0 08/04/2020 08:24 AM  TRIG 177.0 (H) 04/03/2019 07:30 AM     Hypothyroidism    Insomnia 11/08/2021   Morbid obesity (HCC) 02/08/2019   New onset type 2 diabetes mellitus (HCC)  02/23/2023   Type 2 Diabetes Mellitus (E11.9) Onset: 02/23/2023 Key Events:  Initial A1c: 11.1% Presenting with polydipsia Current Treatment: Initiating Metformin  and Mounjaro  Monitoring: Blood glucose monitoring TID Follow-up in 2 weeks Quality Metrics: Eye exam needed Diabetes education pending Tags: #NewProblem #RequiresFollowUp #PatientEducation     Seasonal allergies    Sleep apnea    wears cpap    Thyroid disease    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 Fleeta Smock        PAST SURGICAL HISTORY: Past Surgical History:  Procedure Laterality Date   ABLATION  10/2019   ADENOIDECTOMY     BIOPSY  03/27/2023   Procedure: BIOPSY;  Surgeon: Wilhelmenia Aloha Raddle., MD;  Location: [facility] ENDOSCOPY;  Service: Gastroenterology;;   COLONOSCOPY     DENTAL SURGERY     graft of mouth    ESOPHAGOGASTRODUODENOSCOPY (EGD) WITH PROPOFOL  N/A 03/27/2023   Procedure: ESOPHAGOGASTRODUODENOSCOPY (EGD) WITH PROPOFOL ;  Surgeon: Wilhelmenia Aloha Raddle., MD;  Location: [facility] ENDOSCOPY;  Service: Gastroenterology;  Laterality: N/A;   EUS  03/27/2023   Procedure: UPPER ENDOSCOPIC ULTRASOUND (EUS) LINEAR;  Surgeon: Wilhelmenia Aloha Raddle., MD;  Location: [facility] ENDOSCOPY;  Service: Gastroenterology;;   FINE NEEDLE ASPIRATION  03/27/2023   Procedure: FINE NEEDLE ASPIRATION (FNA) LINEAR;  Surgeon: Wilhelmenia Aloha Raddle., MD;  Location: [facility] ENDOSCOPY;  Service: Gastroenterology;;   IR IMAGING GUIDED PORT INSERTION  04/03/2023     FAMILY HISTORY:  Family History  Problem Relation Age of Onset   Hypertension Mother    Hyperlipidemia Mother    Heart disease Father    Cancer Maternal Grandmother 39 - 90       metastatic, unknown priamry   Prostate cancer Maternal Grandfather 31 - 79   Diabetes Maternal Grandfather    CAD Maternal Grandfather    Breast cancer Maternal Great-grandmother 62 - 87   Colon cancer Neg Hx    Colon polyps Neg Hx    Esophageal cancer Neg  Hx    Stomach cancer Neg Hx    Rectal cancer Neg Hx      SOCIAL HISTORY:  reports that she has never smoked. She has never used smokeless tobacco. She reports that she does not drink alcohol and does not use drugs.   ALLERGIES: Atorvastatin   MEDICATIONS:  Current Outpatient Medications  Medication Sig Dispense Refill   amoxicillin -clavulanate (AUGMENTIN ) 875-125 MG tablet Take 1 tablet by mouth 2 (two) times daily. (Patient not taking: Reported on 03/28/2024)     Azelastine -Fluticasone  137-50 MCG/ACT SUSP PLACE 1 SPRAY INTO THE NOSE EVERY 12 (TWELVE) HOURS. 23 g 1   BD PEN NEEDLE NANO 2ND GEN 32G X 4 MM MISC USE AS DIRECTED 4 TIMES A DAY 300 each 0   Blood Glucose Monitoring Suppl DEVI 1 each by Does not apply route in the morning, at noon, and at bedtime. May substitute to any manufacturer covered by patient's insurance. 1 each 0   carbamazepine  (CARBATROL ) 100 MG 12 hr capsule Take 1 capsule (100 mg total) by mouth 2 (two) times daily. 60 capsule 0   Continuous Glucose Sensor (DEXCOM G7 SENSOR) MISC 1 Act by Does not apply route daily. 1 each 11   Continuous Glucose Sensor (FREESTYLE LIBRE 3 PLUS SENSOR) MISC Change sensor every 15 days. 2 each 11   cyclobenzaprine  (FLEXERIL ) 5 MG tablet Take 1 tablet (5 mg total) by mouth 3 (three) times daily as needed for muscle spasms. 30 tablet 0   escitalopram  (LEXAPRO ) 20 MG tablet TAKE 1 TABLET BY MOUTH EVERY DAY 90 tablet 3   esomeprazole (NEXIUM) 40 MG capsule Take 1 capsule (40 mg total) by mouth daily. 30 capsule 3   Glucagon  (GVOKE HYPOPEN  2-PACK) 0.5 MG/0.1ML SOAJ Inject 0.5 mg into the skin daily as needed (for low blood sugar). (Patient not taking: Reported on 03/28/2024) 0.2 mL 1   insulin  glargine (LANTUS  SOLOSTAR) 100 UNIT/ML Solostar Pen Inject 20 Units into the skin daily. Replaces semglee  18 mL 4   insulin  glargine-yfgn (SEMGLEE ) 100 UNIT/ML Pen SMARTSIG:20 Unit(s) SUB-Q Daily     levocetirizine (XYZAL ) 5 MG tablet TAKE 1 TABLET BY  MOUTH EVERY DAY IN THE EVENING 90 tablet 3   levothyroxine  (SYNTHROID ) 75 MCG tablet TAKE 1 TABLET BY MOUTH EVERY DAY 90 tablet 1   lidocaine -prilocaine  (EMLA ) cream Apply 1 Application topically as needed (Apply to port 1-2 hours prior to use). 30 g 0   lipase/protease/amylase (CREON ) 36000 UNITS CPEP capsule Take 2 capsules (72,000 Units total) by mouth 3 (three) times daily before meals. (Patient not taking: Reported on 03/28/2024) 180 capsule 2   loperamide (IMODIUM) 2 MG capsule Take 2-4 mg by mouth as needed for diarrhea or loose stools. (Patient not taking: Reported on 03/28/2024)     LORazepam  (ATIVAN ) 0.5 MG tablet Take 1 tablet (0.5 mg total) by mouth 2 (two) times daily. 60 tablet 1   losartan -hydrochlorothiazide  (HYZAAR)  50-12.5 MG tablet Take 0.5 tablets by mouth daily. Ok to start with just half tablet daily for week 1 and leave it there if goal of 140/90 reached. 90 tablet 4   magnesium  oxide (MAG-OX) 400 MG tablet TAKE 1 TABLET BY MOUTH TWICE A DAY 60 tablet 3   metFORMIN  (GLUCOPHAGE ) 500 MG tablet Take 1 tablet (500 mg total) by mouth 2 (two) times daily with a meal. 180 tablet 3   methadone (DOLOPHINE) 5 MG tablet Take 2.5 mg by mouth 3 (three) times daily.     montelukast  (SINGULAIR ) 10 MG tablet Take 1 tablet by mouth every evening.  5   morphine  (MSIR) 15 MG tablet Take 1 tablet (15 mg total) by mouth every 4 (four) hours as needed for severe pain (pain score 7-10). 60 tablet 0   ondansetron  (ZOFRAN -ODT) 8 MG disintegrating tablet Take 1 tablet (8 mg total) by mouth every 8 (eight) hours as needed for nausea or vomiting. (Patient not taking: Reported on 03/28/2024) 30 tablet 2   oxyCODONE  (OXY IR/ROXICODONE ) 5 MG immediate release tablet Take by mouth.     potassium chloride  SA (KLOR-CON  M) 20 MEQ tablet TAKE 1 TABLET BY MOUTH EVERY DAY 90 tablet 0   predniSONE  (DELTASONE ) 20 MG tablet Take 1 tablet (20 mg total) by mouth daily with breakfast for 5 days, THEN 0.5 tablets (10 mg total)  daily with breakfast for 5 days. 7.5 tablet 0   pregabalin  (LYRICA ) 50 MG capsule Take 1 capsule (50 mg total) by mouth 3 (three) times daily. 90 capsule 1   prochlorperazine  (COMPAZINE ) 10 MG tablet Take 1 tablet (10 mg total) by mouth every 6 (six) hours as needed for nausea or vomiting. 30 tablet 3   No current facility-administered medications for this visit.     REVIEW OF SYSTEMS: On review of systems, the patient reports that *** is doing well overall. *** denies any chest pain, shortness of breath, cough, fevers, chills, night sweats, unintended weight changes. *** denies any bowel or bladder disturbances, and denies abdominal pain, nausea or vomiting. *** denies any new musculoskeletal or joint aches or pains. A complete review of systems is obtained and is otherwise negative.     PHYSICAL EXAM:  Wt Readings from Last 3 Encounters:  04/02/24 233 lb 1.6 oz (105.7 kg)  03/28/24 225 lb (102.1 kg)  03/04/24 234 lb 8 oz (106.4 kg)   Temp Readings from Last 3 Encounters:  04/02/24 97.8 F (36.6 C) (Temporal)  03/04/24 98.2 F (36.8 C) (Temporal)  02/29/24 98 F (36.7 C) (Temporal)   BP Readings from Last 3 Encounters:  04/02/24 (!) 159/92  03/28/24 (!) 163/106  03/04/24 (!) 158/75   Pulse Readings from Last 3 Encounters:  04/02/24 82  03/28/24 81  03/04/24 76    /10  In general this is a well appearing *** in no acute distress. ***'s alert and oriented x4 and appropriate throughout the examination. Cardiopulmonary assessment is negative for acute distress and *** exhibits normal effort.     ECOG = ***  0 - Asymptomatic (Fully active, able to carry on all predisease activities without restriction)  1 - Symptomatic but completely ambulatory (Restricted in physically strenuous activity but ambulatory and able to carry out work of a light or sedentary nature. For example, light housework, office work)  2 - Symptomatic, <50% in bed during the day (Ambulatory and capable  of all self care but unable to carry out any work activities. Up and about  more than 50% of waking hours)  3 - Symptomatic, >50% in bed, but not bedbound (Capable of only limited self-care, confined to bed or chair 50% or more of waking hours)  4 - Bedbound (Completely disabled. Cannot carry on any self-care. Totally confined to bed or chair)  5 - Death   Raylene MM, Creech RH, Tormey DC, et al. [phone]). Toxicity and response criteria of the [facility]. Am. DOROTHA Bridges. Oncol. 5 (6): 649-55    LABORATORY DATA:  Lab Results  Component Value Date   WBC 4.9 03/04/2024   HGB 11.7 (L) 03/04/2024   HCT 33.9 (L) 03/04/2024   MCV 96.3 03/04/2024   PLT 230 03/04/2024   Lab Results  Component Value Date   NA 140 03/04/2024   K 3.8 03/04/2024   CL 102 03/04/2024   CO2 27 03/04/2024   Lab Results  Component Value Date   ALT 80 (H) 03/04/2024   AST 59 (H) 03/04/2024   ALKPHOS 199 (H) 03/04/2024   BILITOT 0.5 03/04/2024      RADIOGRAPHY: No results found.     IMPRESSION/PLAN: 1. 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 Dr. Dewey discusses the pathology findings and reviews the nature of *** We discussed the risks, benefits, short, and long term effects of radiotherapy, as well as the palliative intent, and the patient is interested in proceeding. Dr. Dewey discusses the delivery and logistics of radiotherapy and anticipates a course of *** weeks of radiotherapy.   In a visit lasting *** minutes, greater than 50% of the time was spent face to face discussing the patient's condition, in preparation for the discussion, and coordinating the patient's care.   The above documentation reflects my direct findings during this shared patient visit. Please see the separate note by Dr. Dewey on this date for the remainder of the patient's plan of care.    Donald KYM Husband, [facility]   **Disclaimer: This note  was dictated with voice recognition software. Similar sounding words can inadvertently be transcribed and this note may contain transcription errors which may not have been corrected upon publication of note.** "

## 2024-04-02 NOTE — Progress Notes (Signed)
 "  Cancer Center OFFICE PROGRESS NOTE   Diagnosis: Pancreas cancer  INTERVAL HISTORY:   [name] returns for follow-up.  She is enrolled in home hospice care.  She continues to have neck and left ear pain.  She is currently on methadone and takes oxycodone  as needed.  Some constipation.  She is on a laxative regimen.  She states that she would feel great if she had improved pain control.  No fever, cough, shortness of breath.  Objective:  Vital signs in last 24 hours:  Blood pressure (!) 159/92, pulse 82, temperature 97.8 F (36.6 C), temperature source Temporal, resp. rate 18, height 5' 3 (1.6 m), weight 233 lb 1.6 oz (105.7 kg), SpO2 98%.    HEENT: White coating over tongue.  No buccal thrush. Resp: Lungs clear bilaterally. Cardio: Regular rate and rhythm. GI: No hepatosplenomegaly.  Mild generalized tenderness. Vascular: No leg edema. Neuro: Alert and oriented. Musculoskeletal: Masslike fullness left to mid lower occipital region. Skin: Palms without erythema.  1 to 2 cm cutaneous nodule lateral right lateral abdominal wall. Port-A-Cath without erythema.  Lab Results:  Lab Results  Component Value Date   WBC 4.9 03/04/2024   HGB 11.7 (L) 03/04/2024   HCT 33.9 (L) 03/04/2024   MCV 96.3 03/04/2024   PLT 230 03/04/2024   NEUTROABS 3.0 03/04/2024    Imaging:  No results found.  Medications: I have reviewed the patient's current medications.  Assessment/Plan: Pancreas cancer CT abdomen/pelvis 03/24/2023: 3 x 4.6 cm pancreas tail mass, multiple liver lesions consistent with metastases, fat stranding surrounding the second and third ports of the duodenum and pancreas head/uncinate  CT chest 03/27/2023: New spiculated nodular density in the right lower lobe, 8 x 4 mm EUS 03/27/2023: Pancreas tail mass, liver lesions, T2 N0 M1, needle biopsy of pancreas mass, adenocarcinoma Foundation 1: K-ras G 12R, HRD signature negative, MSS, tumor mutation burden 0, PD-L1  TPS 1% Elevated CA 19-9 (121,720 on 03/26/2023) Cycle 1 FOLFIRINOX 04/10/2023 Chemotherapy held 04/24/2023 due to neutropenia Cycle 2 FOLFIRINOX 05/04/2023, Udenyca  Cycle 3 FOLFIRINOX 05/22/2023, Udenyca  Cycle 4 FOLFIRINOX 06/05/2023, Udenyca  Cycle 5 FOLFIRINOX 06/19/2023, Udenyca  CTs 06/26/2023: Stable pancreas mass, slight increase in size and number of hepatic metastases, improved ill-defined right lower lobe nodule, new nonspecific small groundglass nodules, improved stranding at the pancreas head/neck (review of CTs with patient-pancreas mass appears slightly smaller, several liver lesions are slightly larger others measured stable or slightly smaller Cycle 6 FOLFIRINOX 07/04/2023, Udenyca  Cycle 7 FOLFIRINOX 07/17/2023, Udenyca ; oxaliplatin  held due to neuropathy and thrombocytopenia Cycle 8 FOLFIRINOX 07/31/2023, Udenyca ; oxaliplatin  held due to neuropathy, irinotecan  dose reduced due to elevated AST/ALT. CTs 08/09/2023-improvement in hepatic metastases.  Decrease in size of pancreatic tail mass.  No evidence of metastatic disease within the chest. Cycle 9 FOLFIRINOX 08/16/2023, Udenyca ; oxaliplatin  held due to neuropathy Cycle 10 FOLFIRINOX 08/29/2023, Udenyca , oxaliplatin  held due to neuropathy Cycle 11 FOLFIRINOX 09/11/2023, Udenyca , oxaliplatin  held due to neuropathy Cycle 12 FOLFIRINOX 09/25/2023, Udenyca , oxaliplatin  held due to neuropathy Cycle 13 FOLFIRINOX 10/11/2023, Udenyca , oxaliplatin  held due to neuropathy  CTs 10/19/2023: Decrease in the pancreas tail mass, stable to slightly smaller hepatic lesions, no new lesions, 4 mm right lower lobe nodule present on 2 previous CTs Cycle 14 FOLFIRINOX 10/24/2023, Udenyca , oxaliplatin  held due to neuropathy Cycle 15 FOLFIRINOX 11/06/2023, Udenyca , oxaliplatin  held due to neuropathy Cycle 16 FOLFIRINOX 11/21/2023, Udenyca , oxaliplatin  held due to neuropathy Cycle 17 FOLFIRINOX 12/04/2023, Udenyca , oxaliplatin  held due to neuropathy Cycle 18 FOLFIRINOX 12/18/2023,  Udenyca ,  oxaliplatin  held due to neuropathy CTs 12/29/2023-stable to slight decrease size of pancreatic tail lesion.  Bilobar hepatic metastatic lesions overall similar to slightly decreased.  Stable 4 mm right lower lobe pulmonary nodule. Cycle 19 FOLFIRINOX 01/08/2024, Udenyca , oxaliplatin  held due to neuropathy Cycle 20 FOLFIRINOX 01/22/2024, Udenyca , oxaliplatin  held due to neuropathy Cycle 21 FOLFIRINOX 02/12/2024, Udenyca , oxaliplatin  held due to neuropathy 03/01/2024 MRI brain: Abnormal bone marrow signal in the clivus, midline suboccipital mass with an irregular rim and central necrosis, 1 cm left pterygoid muscle metastasis   2.  Diabetes 3.  Acute abdominal pain and nausea/vomiting 03/23/2023 secondary to pancreatitis.  Improved 03/31/2023 4.  Hypertension. 5.  Asthma 6.  Hypothyroidism 7.  Hyperlipidemia 8.  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 has metastatic pancreas cancer.  She is enrolled in home hospice.  She is symptomatic with pain at the left lower occipital/neck region and left ear.  Currently on methadone and oxycodone .  We discussed a referral for palliative radiation.  She agrees.  Referral entered at today's visit.  She will continue the current pain regimen.  Port flush today.  Follow-up and port flush in 4 weeks.  We are available to see her sooner if needed.  Plan reviewed with Dr. Cloretta.    Olam Ned ANP/GNP-BC   04/02/2024  8:53 AM        "

## 2024-04-03 ENCOUNTER — Encounter: Payer: Self-pay | Admitting: Radiation Oncology

## 2024-04-03 ENCOUNTER — Other Ambulatory Visit: Payer: Self-pay | Admitting: Internal Medicine

## 2024-04-03 ENCOUNTER — Ambulatory Visit
Admission: RE | Admit: 2024-04-03 | Discharge: 2024-04-03 | Disposition: A | Discharge status: Home / Self Care | Source: Ambulatory Visit | Attending: Radiation Oncology | Admitting: Radiation Oncology

## 2024-04-03 VITALS — BP 179/100 | HR 81 | Temp 98.4°F | Resp 16 | Ht 63.0 in | Wt 234.2 lb

## 2024-04-03 DIAGNOSIS — T451X5A Adverse effect of antineoplastic and immunosuppressive drugs, initial encounter: Secondary | ICD-10-CM | POA: Insufficient documentation

## 2024-04-03 DIAGNOSIS — C7800 Secondary malignant neoplasm of unspecified lung: Secondary | ICD-10-CM | POA: Insufficient documentation

## 2024-04-03 DIAGNOSIS — I10 Essential (primary) hypertension: Secondary | ICD-10-CM | POA: Insufficient documentation

## 2024-04-03 DIAGNOSIS — E782 Mixed hyperlipidemia: Secondary | ICD-10-CM | POA: Insufficient documentation

## 2024-04-03 DIAGNOSIS — Z7952 Long term (current) use of systemic steroids: Secondary | ICD-10-CM | POA: Insufficient documentation

## 2024-04-03 DIAGNOSIS — Z7984 Long term (current) use of oral hypoglycemic drugs: Secondary | ICD-10-CM | POA: Insufficient documentation

## 2024-04-03 DIAGNOSIS — C7951 Secondary malignant neoplasm of bone: Secondary | ICD-10-CM | POA: Insufficient documentation

## 2024-04-03 DIAGNOSIS — H66006 Acute suppurative otitis media without spontaneous rupture of ear drum, recurrent, bilateral: Secondary | ICD-10-CM

## 2024-04-03 DIAGNOSIS — Z8042 Family history of malignant neoplasm of prostate: Secondary | ICD-10-CM | POA: Diagnosis not present

## 2024-04-03 DIAGNOSIS — Z79899 Other long term (current) drug therapy: Secondary | ICD-10-CM | POA: Insufficient documentation

## 2024-04-03 DIAGNOSIS — G620 Drug-induced polyneuropathy: Secondary | ICD-10-CM | POA: Diagnosis not present

## 2024-04-03 DIAGNOSIS — Z794 Long term (current) use of insulin: Secondary | ICD-10-CM | POA: Diagnosis not present

## 2024-04-03 DIAGNOSIS — Z803 Family history of malignant neoplasm of breast: Secondary | ICD-10-CM | POA: Diagnosis not present

## 2024-04-03 DIAGNOSIS — Z9221 Personal history of antineoplastic chemotherapy: Secondary | ICD-10-CM | POA: Insufficient documentation

## 2024-04-03 DIAGNOSIS — E119 Type 2 diabetes mellitus without complications: Secondary | ICD-10-CM | POA: Diagnosis not present

## 2024-04-03 DIAGNOSIS — Z809 Family history of malignant neoplasm, unspecified: Secondary | ICD-10-CM | POA: Diagnosis not present

## 2024-04-03 DIAGNOSIS — C250 Malignant neoplasm of head of pancreas: Secondary | ICD-10-CM

## 2024-04-03 DIAGNOSIS — C787 Secondary malignant neoplasm of liver and intrahepatic bile duct: Secondary | ICD-10-CM | POA: Insufficient documentation

## 2024-04-03 DIAGNOSIS — E7800 Pure hypercholesterolemia, unspecified: Secondary | ICD-10-CM | POA: Diagnosis not present

## 2024-04-03 DIAGNOSIS — E785 Hyperlipidemia, unspecified: Secondary | ICD-10-CM | POA: Insufficient documentation

## 2024-04-03 NOTE — Addendum Note (Signed)
 Encounter addended by: Myda Detwiler M, RN on: 04/03/2024 10:21 AM  Actions taken: Vitals modified

## 2024-04-03 NOTE — Progress Notes (Addendum)
 Histology and Location of Primary Cancer: Pancreatic cancer metastatic to liver, lung  Debbie [name] was diagnosed with cancer involving the pancreas, liver, and lung in January 2025.  She completed 21 cycles of chemotherapy in November 2025.  She was noted to have progression on MRI brain scan 03/01/2024.  She is here to discuss proceeding with palliative radiation therapy.  MRI Brain 03/01/2024:  No brain metastases. There is abnormal bone marrow signal in the clivus which is new compared with the previous brain MRI from Aug 17, 2022 consistent with skeletal metastatic disease. 2.8 x 4.4 x 2 cm midline necrotic suboccipital mass most likely tumor.  1 cm metastatic lesion involving the left pterygoid muscles near their attachment to the pterygoid plate.  Location(s) of Symptomatic Metastases: Left base of head/skull  Past/Anticipated chemotherapy by medical oncology, if any:   Pain on a scale of 0-10 is:  8/10 pain to Left base of head/side of head behind her ear.  She is taking methadone TID, Oxycodone  PRN.    SAFETY ISSUES: Prior radiation? No Pacemaker/ICD? No Possible current pregnancy? Ablation, postmenopausal Is the patient on methotrexate? No  Current Complaints / other details:  Right Chest Port in place

## 2024-04-03 NOTE — Addendum Note (Signed)
 Encounter addended by: Mykah Bellomo M, RN on: 04/03/2024 10:26 AM  Actions taken: Charge Capture section accepted

## 2024-04-15 ENCOUNTER — Inpatient Hospital Stay

## 2024-04-15 ENCOUNTER — Inpatient Hospital Stay: Admitting: Oncology

## 2024-04-17 ENCOUNTER — Inpatient Hospital Stay

## 2024-05-07 ENCOUNTER — Inpatient Hospital Stay

## 2024-05-07 ENCOUNTER — Inpatient Hospital Stay: Admitting: Oncology
